# Patient Record
Sex: Male | Born: 1950
Health system: Southern US, Community
[De-identification: ages and names within clinical notes are randomized; demographics above are authoritative.]

## PROBLEM LIST (undated history)

## (undated) DIAGNOSIS — B019 Varicella without complication: Secondary | ICD-10-CM

## (undated) DIAGNOSIS — E119 Type 2 diabetes mellitus without complications: Secondary | ICD-10-CM

## (undated) DIAGNOSIS — K5790 Diverticulosis of intestine, part unspecified, without perforation or abscess without bleeding: Secondary | ICD-10-CM

## (undated) DIAGNOSIS — I219 Acute myocardial infarction, unspecified: Secondary | ICD-10-CM

## (undated) DIAGNOSIS — H409 Unspecified glaucoma: Secondary | ICD-10-CM

## (undated) DIAGNOSIS — I2510 Atherosclerotic heart disease of native coronary artery without angina pectoris: Secondary | ICD-10-CM

## (undated) DIAGNOSIS — K219 Gastro-esophageal reflux disease without esophagitis: Secondary | ICD-10-CM

## (undated) DIAGNOSIS — G4730 Sleep apnea, unspecified: Secondary | ICD-10-CM

## (undated) DIAGNOSIS — F329 Major depressive disorder, single episode, unspecified: Secondary | ICD-10-CM

## (undated) DIAGNOSIS — F419 Anxiety disorder, unspecified: Secondary | ICD-10-CM

## (undated) DIAGNOSIS — N419 Inflammatory disease of prostate, unspecified: Secondary | ICD-10-CM

## (undated) DIAGNOSIS — I499 Cardiac arrhythmia, unspecified: Secondary | ICD-10-CM

## (undated) DIAGNOSIS — N200 Calculus of kidney: Secondary | ICD-10-CM

## (undated) DIAGNOSIS — I252 Old myocardial infarction: Secondary | ICD-10-CM

## (undated) DIAGNOSIS — I10 Essential (primary) hypertension: Secondary | ICD-10-CM

## (undated) DIAGNOSIS — E785 Hyperlipidemia, unspecified: Secondary | ICD-10-CM

## (undated) DIAGNOSIS — K5792 Diverticulitis of intestine, part unspecified, without perforation or abscess without bleeding: Secondary | ICD-10-CM

## (undated) DIAGNOSIS — K259 Gastric ulcer, unspecified as acute or chronic, without hemorrhage or perforation: Secondary | ICD-10-CM

## (undated) DIAGNOSIS — IMO0001 Reserved for inherently not codable concepts without codable children: Secondary | ICD-10-CM

## (undated) DIAGNOSIS — N390 Urinary tract infection, site not specified: Secondary | ICD-10-CM

## (undated) DIAGNOSIS — J189 Pneumonia, unspecified organism: Secondary | ICD-10-CM

## (undated) DIAGNOSIS — F32A Depression, unspecified: Secondary | ICD-10-CM

## (undated) DIAGNOSIS — K2270 Barrett's esophagus without dysplasia: Secondary | ICD-10-CM

## (undated) HISTORY — DX: Old myocardial infarction: I25.2

## (undated) HISTORY — DX: Cardiac arrhythmia, unspecified: I49.9

## (undated) HISTORY — DX: Urinary tract infection, site not specified: N39.0

## (undated) HISTORY — DX: Varicella without complication: B01.9

## (undated) HISTORY — PX: EYE SURGERY: SHX253

## (undated) HISTORY — DX: Barrett's esophagus without dysplasia: K22.70

## (undated) HISTORY — DX: Anxiety disorder, unspecified: F41.9

## (undated) HISTORY — DX: Gastro-esophageal reflux disease without esophagitis: K21.9

## (undated) HISTORY — DX: Calculus of kidney: N20.0

## (undated) HISTORY — DX: Type 2 diabetes mellitus without complications: E11.9

## (undated) HISTORY — PX: OTHER SURGICAL HISTORY: SHX169

## (undated) HISTORY — DX: Diverticulitis of intestine, part unspecified, without perforation or abscess without bleeding: K57.92

## (undated) HISTORY — DX: Sleep apnea, unspecified: G47.30

---
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
## 2014-11-19 DIAGNOSIS — J189 Pneumonia, unspecified organism: Secondary | ICD-10-CM

## 2014-11-19 HISTORY — DX: Pneumonia, unspecified organism: J18.9

## 2015-06-10 ENCOUNTER — Emergency Department (HOSPITAL_COMMUNITY): Payer: Medicare Other

## 2015-06-10 ENCOUNTER — Inpatient Hospital Stay (HOSPITAL_COMMUNITY)
Admission: EM | Admit: 2015-06-10 | Discharge: 2015-06-22 | DRG: 871 | Disposition: A | Discharge status: Rehabilitation Facility | Payer: Medicare Other | Attending: Internal Medicine | Admitting: Internal Medicine

## 2015-06-10 ENCOUNTER — Encounter (HOSPITAL_COMMUNITY): Payer: Self-pay | Admitting: *Deleted

## 2015-06-10 DIAGNOSIS — Z7982 Long term (current) use of aspirin: Secondary | ICD-10-CM

## 2015-06-10 DIAGNOSIS — E875 Hyperkalemia: Secondary | ICD-10-CM | POA: Diagnosis not present

## 2015-06-10 DIAGNOSIS — R911 Solitary pulmonary nodule: Secondary | ICD-10-CM | POA: Diagnosis not present

## 2015-06-10 DIAGNOSIS — I25110 Atherosclerotic heart disease of native coronary artery with unstable angina pectoris: Secondary | ICD-10-CM | POA: Diagnosis not present

## 2015-06-10 DIAGNOSIS — R000 Tachycardia, unspecified: Secondary | ICD-10-CM

## 2015-06-10 DIAGNOSIS — A419 Sepsis, unspecified organism: Principal | ICD-10-CM | POA: Diagnosis present

## 2015-06-10 DIAGNOSIS — D62 Acute posthemorrhagic anemia: Secondary | ICD-10-CM | POA: Diagnosis not present

## 2015-06-10 DIAGNOSIS — Z6841 Body Mass Index (BMI) 40.0 and over, adult: Secondary | ICD-10-CM

## 2015-06-10 DIAGNOSIS — Z955 Presence of coronary angioplasty implant and graft: Secondary | ICD-10-CM | POA: Diagnosis not present

## 2015-06-10 DIAGNOSIS — E876 Hypokalemia: Secondary | ICD-10-CM | POA: Diagnosis present

## 2015-06-10 DIAGNOSIS — I2510 Atherosclerotic heart disease of native coronary artery without angina pectoris: Secondary | ICD-10-CM | POA: Diagnosis not present

## 2015-06-10 DIAGNOSIS — N179 Acute kidney failure, unspecified: Secondary | ICD-10-CM | POA: Diagnosis present

## 2015-06-10 DIAGNOSIS — E785 Hyperlipidemia, unspecified: Secondary | ICD-10-CM | POA: Diagnosis not present

## 2015-06-10 DIAGNOSIS — N390 Urinary tract infection, site not specified: Secondary | ICD-10-CM | POA: Diagnosis not present

## 2015-06-10 DIAGNOSIS — R7989 Other specified abnormal findings of blood chemistry: Secondary | ICD-10-CM | POA: Diagnosis not present

## 2015-06-10 DIAGNOSIS — R197 Diarrhea, unspecified: Secondary | ICD-10-CM | POA: Diagnosis not present

## 2015-06-10 DIAGNOSIS — IMO0001 Reserved for inherently not codable concepts without codable children: Secondary | ICD-10-CM | POA: Diagnosis present

## 2015-06-10 DIAGNOSIS — R0609 Other forms of dyspnea: Secondary | ICD-10-CM | POA: Diagnosis present

## 2015-06-10 DIAGNOSIS — I509 Heart failure, unspecified: Secondary | ICD-10-CM | POA: Diagnosis not present

## 2015-06-10 DIAGNOSIS — I129 Hypertensive chronic kidney disease with stage 1 through stage 4 chronic kidney disease, or unspecified chronic kidney disease: Secondary | ICD-10-CM | POA: Diagnosis present

## 2015-06-10 DIAGNOSIS — J810 Acute pulmonary edema: Secondary | ICD-10-CM | POA: Diagnosis present

## 2015-06-10 DIAGNOSIS — K921 Melena: Secondary | ICD-10-CM | POA: Diagnosis not present

## 2015-06-10 DIAGNOSIS — N189 Chronic kidney disease, unspecified: Secondary | ICD-10-CM | POA: Diagnosis not present

## 2015-06-10 DIAGNOSIS — H409 Unspecified glaucoma: Secondary | ICD-10-CM | POA: Diagnosis not present

## 2015-06-10 DIAGNOSIS — I214 Non-ST elevation (NSTEMI) myocardial infarction: Secondary | ICD-10-CM | POA: Diagnosis present

## 2015-06-10 DIAGNOSIS — R933 Abnormal findings on diagnostic imaging of other parts of digestive tract: Secondary | ICD-10-CM | POA: Diagnosis not present

## 2015-06-10 DIAGNOSIS — I472 Ventricular tachycardia: Secondary | ICD-10-CM

## 2015-06-10 DIAGNOSIS — E872 Acidosis, unspecified: Secondary | ICD-10-CM | POA: Diagnosis present

## 2015-06-10 DIAGNOSIS — F329 Major depressive disorder, single episode, unspecified: Secondary | ICD-10-CM | POA: Diagnosis present

## 2015-06-10 DIAGNOSIS — R100 Acute abdomen: Secondary | ICD-10-CM | POA: Diagnosis not present

## 2015-06-10 DIAGNOSIS — A09 Infectious gastroenteritis and colitis, unspecified: Secondary | ICD-10-CM | POA: Diagnosis not present

## 2015-06-10 DIAGNOSIS — R1084 Generalized abdominal pain: Secondary | ICD-10-CM

## 2015-06-10 DIAGNOSIS — Z79899 Other long term (current) drug therapy: Secondary | ICD-10-CM | POA: Diagnosis not present

## 2015-06-10 DIAGNOSIS — K559 Vascular disorder of intestine, unspecified: Secondary | ICD-10-CM | POA: Diagnosis present

## 2015-06-10 DIAGNOSIS — R5381 Other malaise: Secondary | ICD-10-CM | POA: Diagnosis not present

## 2015-06-10 DIAGNOSIS — R109 Unspecified abdominal pain: Secondary | ICD-10-CM | POA: Diagnosis present

## 2015-06-10 DIAGNOSIS — I10 Essential (primary) hypertension: Secondary | ICD-10-CM | POA: Diagnosis not present

## 2015-06-10 DIAGNOSIS — Z87891 Personal history of nicotine dependence: Secondary | ICD-10-CM | POA: Diagnosis not present

## 2015-06-10 DIAGNOSIS — N3000 Acute cystitis without hematuria: Secondary | ICD-10-CM | POA: Diagnosis not present

## 2015-06-10 DIAGNOSIS — K529 Noninfective gastroenteritis and colitis, unspecified: Secondary | ICD-10-CM | POA: Diagnosis not present

## 2015-06-10 DIAGNOSIS — R778 Other specified abnormalities of plasma proteins: Secondary | ICD-10-CM | POA: Diagnosis present

## 2015-06-10 DIAGNOSIS — E869 Volume depletion, unspecified: Secondary | ICD-10-CM | POA: Diagnosis not present

## 2015-06-10 DIAGNOSIS — K219 Gastro-esophageal reflux disease without esophagitis: Secondary | ICD-10-CM | POA: Diagnosis not present

## 2015-06-10 HISTORY — DX: Reserved for inherently not codable concepts without codable children: IMO0001

## 2015-06-10 HISTORY — DX: Essential (primary) hypertension: I10

## 2015-06-10 HISTORY — DX: Pneumonia, unspecified organism: J18.9

## 2015-06-10 HISTORY — DX: Gastric ulcer, unspecified as acute or chronic, without hemorrhage or perforation: K25.9

## 2015-06-10 HISTORY — DX: Atherosclerotic heart disease of native coronary artery without angina pectoris: I25.10

## 2015-06-10 HISTORY — DX: Unspecified glaucoma: H40.9

## 2015-06-10 HISTORY — DX: Hyperlipidemia, unspecified: E78.5

## 2015-06-10 HISTORY — DX: Diverticulosis of intestine, part unspecified, without perforation or abscess without bleeding: K57.90

## 2015-06-10 HISTORY — DX: Gastro-esophageal reflux disease without esophagitis: K21.9

## 2015-06-10 HISTORY — DX: Inflammatory disease of prostate, unspecified: N41.9

## 2015-06-10 LAB — URINALYSIS, ROUTINE W REFLEX MICROSCOPIC
Glucose, UA: NEGATIVE mg/dL
Hgb urine dipstick: NEGATIVE
Ketones, ur: 15 mg/dL — AB
Leukocytes, UA: NEGATIVE
NITRITE: NEGATIVE
Protein, ur: 100 mg/dL — AB
Specific Gravity, Urine: >1.03 — ABNORMAL HIGH (ref 1.005–1.030)
Urobilinogen, UA: 1 mg/dL (ref 0.0–1.0)
pH: 5.5 (ref 5.0–8.0)

## 2015-06-10 LAB — I-STAT CG4 LACTIC ACID, ED: Lactic Acid, Venous: 2.82 mmol/L (ref 0.5–2.0)

## 2015-06-10 LAB — CBC WITH DIFFERENTIAL/PLATELET
BASOS PCT: 0 % (ref 0–1)
Basophils Absolute: 0 10*3/uL (ref 0.0–0.1)
EOS PCT: 0 % (ref 0–5)
Eosinophils Absolute: 0 10*3/uL (ref 0.0–0.7)
HCT: 50.5 % (ref 39.0–52.0)
HEMOGLOBIN: 16.5 g/dL (ref 13.0–17.0)
LYMPHS ABS: 1.2 10*3/uL (ref 0.7–4.0)
LYMPHS PCT: 6 % — AB (ref 12–46)
MCH: 28.7 pg (ref 26.0–34.0)
MCHC: 32.7 g/dL (ref 30.0–36.0)
MCV: 87.8 fL (ref 78.0–100.0)
MONO ABS: 0.8 10*3/uL (ref 0.1–1.0)
Monocytes Relative: 4 % (ref 3–12)
NEUTROS PCT: 90 % — AB (ref 43–77)
Neutro Abs: 17.3 10*3/uL — ABNORMAL HIGH (ref 1.7–7.7)
PLATELETS: 268 10*3/uL (ref 150–400)
RBC: 5.75 MIL/uL (ref 4.22–5.81)
RDW: 14.4 % (ref 11.5–15.5)
WBC: 19.4 10*3/uL — AB (ref 4.0–10.5)

## 2015-06-10 LAB — COMPREHENSIVE METABOLIC PANEL
ALBUMIN: 4.1 g/dL (ref 3.5–5.0)
ALT: 24 U/L (ref 17–63)
AST: 25 U/L (ref 15–41)
Alkaline Phosphatase: 49 U/L (ref 38–126)
Anion gap: 10 (ref 5–15)
BUN: 30 mg/dL — AB (ref 6–20)
CALCIUM: 9.5 mg/dL (ref 8.9–10.3)
CHLORIDE: 101 mmol/L (ref 101–111)
CO2: 23 mmol/L (ref 22–32)
Creatinine, Ser: 2.25 mg/dL — ABNORMAL HIGH (ref 0.61–1.24)
GFR calc non Af Amer: 29 mL/min — ABNORMAL LOW (ref >60)
GFR, EST AFRICAN AMERICAN: 33 mL/min — AB (ref >60)
Glucose, Bld: 253 mg/dL — ABNORMAL HIGH (ref 65–99)
POTASSIUM: 5.1 mmol/L (ref 3.5–5.1)
SODIUM: 134 mmol/L — AB (ref 135–145)
TOTAL PROTEIN: 7 g/dL (ref 6.5–8.1)
Total Bilirubin: 0.8 mg/dL (ref 0.3–1.2)

## 2015-06-10 LAB — TROPONIN I
Troponin I: 0.37 ng/mL — ABNORMAL HIGH (ref <0.031)
Troponin I: 0.95 ng/mL (ref <0.031)

## 2015-06-10 LAB — LIPASE, BLOOD: Lipase: 14 U/L — ABNORMAL LOW (ref 22–51)

## 2015-06-10 LAB — PROTIME-INR
INR: 1.08 (ref 0.00–1.49)
Prothrombin Time: 14.2 seconds (ref 11.6–15.2)

## 2015-06-10 LAB — URINE MICROSCOPIC-ADD ON

## 2015-06-10 LAB — BRAIN NATRIURETIC PEPTIDE: B Natriuretic Peptide: 16 pg/mL (ref 0.0–100.0)

## 2015-06-10 MED ORDER — HEPARIN BOLUS VIA INFUSION
4000.0000 [IU] | Freq: Once | INTRAVENOUS | Status: AC
  Administered 2015-06-10: 4000 [IU] via INTRAVENOUS
  Filled 2015-06-10: qty 4000

## 2015-06-10 MED ORDER — SODIUM CHLORIDE 0.9 % IV SOLN
Freq: Once | INTRAVENOUS | Status: AC
  Administered 2015-06-10: 12:00:00 via INTRAVENOUS

## 2015-06-10 MED ORDER — HYDROMORPHONE HCL 1 MG/ML IJ SOLN
1.0000 mg | Freq: Once | INTRAMUSCULAR | Status: AC
  Administered 2015-06-10: 1 mg via INTRAVENOUS
  Filled 2015-06-10: qty 1

## 2015-06-10 MED ORDER — IOHEXOL 300 MG/ML  SOLN
50.0000 mL | Freq: Once | INTRAMUSCULAR | Status: AC | PRN
  Administered 2015-06-10: 50 mL via ORAL

## 2015-06-10 MED ORDER — OXYCODONE HCL 5 MG PO TABS
5.0000 mg | ORAL_TABLET | Freq: Four times a day (QID) | ORAL | Status: DC | PRN
  Administered 2015-06-11 – 2015-06-13 (×4): 5 mg via ORAL
  Filled 2015-06-10 (×4): qty 1

## 2015-06-10 MED ORDER — METRONIDAZOLE IN NACL 5-0.79 MG/ML-% IV SOLN
500.0000 mg | Freq: Four times a day (QID) | INTRAVENOUS | Status: DC
  Administered 2015-06-10 – 2015-06-20 (×39): 500 mg via INTRAVENOUS
  Filled 2015-06-10 (×41): qty 100

## 2015-06-10 MED ORDER — ONDANSETRON HCL 4 MG/2ML IJ SOLN
4.0000 mg | Freq: Once | INTRAMUSCULAR | Status: AC
  Administered 2015-06-10: 4 mg via INTRAVENOUS

## 2015-06-10 MED ORDER — CIPROFLOXACIN IN D5W 400 MG/200ML IV SOLN
400.0000 mg | Freq: Two times a day (BID) | INTRAVENOUS | Status: DC
  Administered 2015-06-10 – 2015-06-19 (×19): 400 mg via INTRAVENOUS
  Filled 2015-06-10 (×23): qty 200

## 2015-06-10 MED ORDER — SODIUM CHLORIDE 0.9 % IV SOLN
INTRAVENOUS | Status: DC
  Administered 2015-06-10 – 2015-06-14 (×7): via INTRAVENOUS

## 2015-06-10 MED ORDER — PNEUMOCOCCAL VAC POLYVALENT 25 MCG/0.5ML IJ INJ
0.5000 mL | INJECTION | INTRAMUSCULAR | Status: AC
  Administered 2015-06-11: 0.5 mL via INTRAMUSCULAR
  Filled 2015-06-10 (×2): qty 0.5

## 2015-06-10 MED ORDER — NITROGLYCERIN 2 % TD OINT
1.0000 [in_us] | TOPICAL_OINTMENT | Freq: Four times a day (QID) | TRANSDERMAL | Status: DC
  Administered 2015-06-10 – 2015-06-20 (×32): 1 [in_us] via TOPICAL
  Filled 2015-06-10 (×12): qty 30
  Filled 2015-06-10: qty 1
  Filled 2015-06-10 (×5): qty 30

## 2015-06-10 MED ORDER — ONDANSETRON HCL 4 MG/2ML IJ SOLN
INTRAMUSCULAR | Status: AC
  Filled 2015-06-10: qty 2

## 2015-06-10 MED ORDER — HEPARIN (PORCINE) IN NACL 100-0.45 UNIT/ML-% IJ SOLN
2500.0000 [IU]/h | INTRAMUSCULAR | Status: DC
  Administered 2015-06-10: 1500 [IU]/h via INTRAVENOUS
  Administered 2015-06-11: 2100 [IU]/h via INTRAVENOUS
  Administered 2015-06-11: 1850 [IU]/h via INTRAVENOUS
  Administered 2015-06-11 – 2015-06-12 (×3): 2100 [IU]/h via INTRAVENOUS
  Administered 2015-06-13: 2900 [IU] via INTRAVENOUS
  Administered 2015-06-14 – 2015-06-15 (×4): 2900 [IU]/h via INTRAVENOUS
  Administered 2015-06-15: 2850 [IU]/h via INTRAVENOUS
  Administered 2015-06-16: 2500 [IU]/h via INTRAVENOUS
  Filled 2015-06-10 (×19): qty 250

## 2015-06-10 MED ORDER — ONDANSETRON HCL 4 MG/2ML IJ SOLN: 4.0000 mg | Freq: Once | INTRAMUSCULAR | Status: DC

## 2015-06-10 NOTE — H&P (Signed)
History and Physical  Sergio Griffin OVF:[id number] DOB: September 24, 1951 DOA: 06/10/2015  Referring physician: Dr Betsey Holiday, ED physician PCP: No primary care provider on file.   Chief Complaint: Abdominal pain, bloody diarrhea, chest pain  HPI: Sergio Griffin is a 64 y.o. male  With a history of hypertension, hyperlipidemia, GERD, coronary artery disease with coronary angiogram with stenting approximately 5-6 years ago. He has a cardiologist up in [place] that he follows with yearly. He presents to the hospital with increasing abdominal pain, bloody diarrhea that started at approximately 1 this morning. He has had multiple episodes of bloody diarrhea that has progressed throughout the morning, accompanied by feeling extremely ill with left-sided, substernal chest pain, diaphoresis, cold sweats, and retching. At approximately 11 AM, the patient decided to present to the emergency room for evaluation. He has had no bloody diarrhea since that time.  With his chest pain, the patient has had left-sided, substernal chest pain doubt radiation, associated with diaphoresis, cold sweats, nausea. He had the chest pain for approximately 6-7 hours, but this has gradually trailed off since being at the hospital. He currently reports no chest pain.   Review of Systems:   Pt denies any fevers, chills, palpitations, headaches, blurred vision, vomiting, rashes, bruising, petechiae, vision changes. He also denies sick contacts, recent antibiotic use, travel. Review of systems are otherwise negative  Past Medical History  Diagnosis Date  . Hypertension   . Reflux   . Glaucoma   . Hyperlipidemia    Past Surgical History  Procedure Laterality Date  . Cardiac stents    . Eye surgery     Social History:  reports that he has quit smoking. He does not have any smokeless tobacco history on file. He reports that he does not drink alcohol. His drug history is not on file. Patient lives at home & is able to  participate in activities of daily living   Allergies  Allergen Reactions  . Sulfa Antibiotics Itching    Family history of obesity  Prior to Admission medications   Medication Sig Start Date End Date Taking? Authorizing Provider  aspirin EC 81 MG tablet Take 162 mg by mouth daily.   Yes Historical Provider, MD  buPROPion (WELLBUTRIN XL) 300 MG 24 hr tablet Take 300 mg by mouth at bedtime. 05/10/15  Yes Historical Provider, MD  clonazePAM (KLONOPIN) 0.5 MG tablet Take 0.5 mg by mouth 2 (two) times daily. 05/10/15  Yes Historical Provider, MD  latanoprost (XALATAN) 0.005 % ophthalmic solution Place 1 drop into both eyes at bedtime. 05/03/15  Yes Historical Provider, MD  losartan (COZAAR) 50 MG tablet Take 50 mg by mouth daily. 03/25/15  Yes Historical Provider, MD  metoprolol succinate (TOPROL-XL) 25 MG 24 hr tablet Take 25 mg by mouth daily. 05/02/15  Yes Historical Provider, MD  Omega-3 Fatty Acids (FISH OIL) 1000 MG CAPS Take 1,000 mg by mouth daily.   Yes Historical Provider, MD  promethazine (PHENERGAN) 25 MG tablet Take 25 mg by mouth every 6 (six) hours as needed for nausea or vomiting.   Yes Historical Provider, MD  rosuvastatin (CRESTOR) 20 MG tablet Take 20 mg by mouth every evening.   Yes Historical Provider, MD  sucralfate (CARAFATE) 1 G tablet Take 1 g by mouth 4 (four) times daily -  with meals and at bedtime.   Yes Historical Provider, MD  Vitamin D, Cholecalciferol, 1000 UNITS TABS Take 1,000 mg by mouth daily.   Yes Historical Provider, MD  Physical Exam: BP 189/71 mmHg  Pulse 69  Temp(Src) 97.6 F (36.4 C) (Oral)  Resp 18  Ht 5\' 11"  (1.803 m)  Wt 141.794 kg (312 lb 9.6 oz)  BMI 43.62 kg/m2  SpO2 94%  General: Older Caucasian male. Awake and alert and oriented x3. No acute cardiopulmonary distress.  Eyes: Pupils equal, round, reactive to light. Extraocular muscles are intact. Sclerae anicteric and noninjected.  ENT:  Moist mucosal membranes. No mucosal lesions. Teeth  in moderate repair  Neck: Neck supple without lymphadenopathy. No carotid bruits. No masses palpated.  Cardiovascular: Regular rate with normal S1-S2 sounds. No murmurs, rubs, gallops auscultated. No JVD.  Respiratory: Good respiratory effort with no wheezes, rales, rhonchi. Lungs clear to auscultation bilaterally.  Abdomen: Soft, mildly tender on the left side, nondistended. Active bowel sounds. No masses or hepatosplenomegaly  Skin: Dry, warm to touch. 2+ dorsalis pedis and radial pulses. Musculoskeletal: No calf or leg pain. All major joints not erythematous nontender.  Psychiatric: Intact judgment and insight.  Neurologic: No focal neurological deficits. Cranial nerves II through XII are grossly intact.           Labs on Admission:  Basic Metabolic Panel:  Recent Labs Lab 06/10/15 1225  NA 134*  K 5.1  CL 101  CO2 23  GLUCOSE 253*  BUN 30*  CREATININE 2.25*  CALCIUM 9.5   Liver Function Tests:  Recent Labs Lab 06/10/15 1225  AST 25  ALT 24  ALKPHOS 49  BILITOT 0.8  PROT 7.0  ALBUMIN 4.1    Recent Labs Lab 06/10/15 1225  LIPASE 14*   No results for input(s): AMMONIA in the last 168 hours. CBC:  Recent Labs Lab 06/10/15 1225  WBC 19.4*  NEUTROABS 17.3*  HGB 16.5  HCT 50.5  MCV 87.8  PLT 268   Cardiac Enzymes:  Recent Labs Lab 06/10/15 1225 06/10/15 1657  TROPONINI 0.37* 0.95*    BNP (last 3 results)  Recent Labs  06/10/15 1225  BNP 16.0    ProBNP (last 3 results) No results for input(s): PROBNP in the last 8760 hours.  CBG: No results for input(s): GLUCAP in the last 168 hours.  Radiological Exams on Admission: Ct Abdomen Pelvis Wo Contrast  06/10/2015   CLINICAL DATA:  Abdominal pain, constipation. Bright red blood in stool.  EXAM: CT ABDOMEN AND PELVIS WITHOUT CONTRAST  TECHNIQUE: Multidetector CT imaging of the abdomen and pelvis was performed following the standard protocol without IV contrast.  COMPARISON:  None.  FINDINGS:  The transverse colon and descending colon are decompressed which limits characterization of their walls but I suspect mild thickening of the walls of the descending colon. There is also subtle inflammatory stranding within the paracolic fat adjacent to the descending colon and trace free fluid within the lower left paracolic gutter.  Scattered diverticulosis noted within the descending and sigmoid colon, most prominent in the sigmoid colon, but no focal inflammatory change to suggest acute diverticulitis. Large and small bowel are normal in caliber throughout.  Liver, spleen, pancreas, gallbladder, and adrenal glands are within normal limits for a noncontrast study. There are 2 nonobstructing stones in the left renal pelvis, largest measuring 6 mm. No associated hydronephrosis. 10 mm hypodense mass exophytic to the lateral cortex of the right kidney is too small to characterize but probably a small cyst. No right- sided renal stone or hydronephrosis. No ureteral or bladder calculi.  No free fluid or abscess collection. No free intraperitoneal air. Scattered atherosclerotic changes are seen  along the walls of the normal- caliber abdominal aorta.  There is mild dependent atelectasis within the lower lungs bilaterally. 7 mm pulmonary nodule is also identified within the right lower lobe on axial image 5.  IMPRESSION: 1. Probable mild thickening of the walls of the descending colon suggesting a mild colitis of infectious or inflammatory nature. Associated subtle inflammatory stranding within the paracolic fat and trace free fluid in the lower left paracolic gutter. 2. Colonic diverticulosis without evidence of acute diverticulitis. 3. 7 mm pulmonary nodule within the right lower lobe, too small to definitively characterize. If the patient is at high risk for bronchogenic carcinoma, follow-up chest CT at 3-70months is recommended. If the patient is at low risk for bronchogenic carcinoma, follow-up chest CT at 6-12 months  is recommended. This recommendation follows the consensus statement: Guidelines for Management of Small Pulmonary Nodules Detected on CT Scans: A Statement from the [place] as published in Radiology 2005; 237:395-400. 4. 1 cm mass exophytic to the lateral cortex of the right kidney. This is also too small to definitively characterize but most likely a benign cyst based on CT density measurements. Would consider follow-up renal ultrasound at some point to ensure benignity. 5. Left nephrolithiasis. 6. No bowel obstruction. No abscess collection. No free intraperitoneal air. These results were called by telephone at the time of interpretation on 06/10/2015 at 4:24 pm to Dr. Joseph Berkshire , who verbally acknowledged these results.   Electronically Signed   By: Franki Cabot M.D.   On: 06/10/2015 16:25   Dg Abd Acute W/chest  06/10/2015   CLINICAL DATA:  Sudden onset of severe abdominal pain at 0300 hours, abdominal distension, history hypertension, diverticulitis, former smoker  EXAM: DG ABDOMEN ACUTE W/ 1V CHEST  COMPARISON:  None  FINDINGS: Enlargement of cardiac silhouette.  Mediastinal contours and pulmonary vascularity normal.  Lungs clear.  No pleural effusion or pneumothorax.  Mild gaeous distention of stomach.  Question wall thickening of duodenum.  Nonobstructive bowel gas pattern otherwise seen.  No bowel dilatation or free air.  No urinary tract calcification or bone abnormality.  IMPRESSION: Enlargement of cardiac silhouette.  Mild gaseous distention of stomach with questionable wall thickening of the descending duodenum.  Otherwise normal bowel gas pattern.   Electronically Signed   By: Lavonia Dana M.D.   On: 06/10/2015 13:23    EKG: Independently reviewed. Normal sinus rhythm with a ventricular rate of 69. Normal QRS and PR intervals.  Q waves in leads 3 and aVF suggest old inferior MI  Assessment/Plan Present on Admission:  . Colitis . NSTEMI (non-ST elevated myocardial  infarction) . Acute on chronic kidney failure . Lactic acidosis  This patient was discussed with the ED physician, including pertinent vitals, physical exam findings, labs, and imaging.  We also discussed care given by the ED provider.  #1 NSTEMI  Admit to stepdown [facility]  I discussed this patient with Dr. Tommi Rumps of cardiology who recommended starting anticoagulation, which will have to be cautious about given that the patient has bloody infectious colitis. I discussed with the patient the risks and benefits of treatment with heparin.  Currently the patient is without chest pain  Start full dose heparin drip per ACS  Apply Nitropaste  Follow blood pressure  Cycle troponins  Obtain lipid panel morning  Daily aspirin  Daily EKGs #2 infectious colitis  Continue Cipro and Flagyl  Keep nothing by mouth/sips with meds  IV fluids at 125 mLper hour  Recheck CBC  in the morning #3 lactic acidosis  Fluid resuscitated - we'll follow lactic acid level #4 acute on chronic kidney failure  We'll hydrates, which will likely resolve his acute on chronic kidney failure  Recheck creatinine in the morning #5 possible UTI  Cipro should cover #6 hypertension  Patient currently on beta blocker  We'll see how he responds to nitroglycerin paste  DVT prophylaxis: On full dose anticoagulation with heparin  Consultants: Cardiology  Code Status: Full code  Family Communication: None   Disposition Plan: Transfer to Stepdown at [place], [place] [facility] Pager [phone]

## 2015-06-10 NOTE — Plan of Care (Signed)
Pt concern about blood in stool. RN witnessed bright red blood in toilet (moderate amt). Small BM (hard pebbles). Pt also informed RN that he continues to have urgency to urinate - but only urinates a little. Pt educated that it's fairly normal for a UTI.

## 2015-06-10 NOTE — Progress Notes (Signed)
ANTIBIOTIC CONSULT NOTE - INITIAL  Pharmacy Consult for Cipro Indication: Intra abdominal infection  Allergies  Allergen Reactions  . Sulfa Antibiotics Itching    Patient Measurements: Height: 5\' 11"  (180.3 cm) Weight: (!) 312 lb (141.522 kg) IBW/kg (Calculated) : 75.3 Adjusted Body Weight:   Vital Signs: Temp: 97.6 F (36.4 C) (07/22 1152) Temp Source: Oral (07/22 1152) BP: 152/87 mmHg (07/22 1600) Pulse Rate: 69 (07/22 1530) Intake/Output from previous day:   Intake/Output from this shift:    Labs:  Recent Labs  06/10/15 1225  WBC 19.4*  HGB 16.5  PLT 268  CREATININE 2.25*   Estimated Creatinine Clearance: 47.1 mL/min (by C-G formula based on Cr of 2.25). No results for input(s): VANCOTROUGH, VANCOPEAK, VANCORANDOM, GENTTROUGH, GENTPEAK, GENTRANDOM, TOBRATROUGH, TOBRAPEAK, TOBRARND, AMIKACINPEAK, AMIKACINTROU, AMIKACIN in the last 72 hours.   Microbiology: No results found for this or any previous visit (from the past 720 hour(s)).  Medical History: Past Medical History  Diagnosis Date  . Hypertension   . Reflux   . Glaucoma   . Hyperlipidemia     Medications:   (Not in a hospital admission) Assessment: 64 yo ED patient, Cipro per pharmacy for intra abdominal infection CrCl 47.1 ml/min ED MD note presently not available  Goal of Therapy:  Eradicate infection  Plan:  Cipro 400 mg IV every 12 hours Monitor renal function Labs per protocol  Abner Greenspan, Hillarie Harrigan Bennett 06/10/2015,4:49 PM

## 2015-06-10 NOTE — Consult Note (Addendum)
Patient ID: DEMAURI ADVINCULA MRN: [id number], DOB/AGE: October 24, 1951   Admit date: 06/10/2015   Primary Physician: No primary care provider on file. Primary Cardiologist: Orpah Greek [place], [place])  Pt. Profile:  38M with HTN, HLD, CAD s/p PCI x 2 in 2011 who presents with colitis, AKI, lactic acidosis, and NSTEMI.   Problem List  Past Medical History  Diagnosis Date  . Hypertension   . Reflux   . Glaucoma   . Hyperlipidemia     Past Surgical History  Procedure Laterality Date  . Cardiac stents    . Eye surgery       Allergies  Allergies  Allergen Reactions  . Sulfa Antibiotics Itching    HPI  38M with HTN, HLD, CAD s/p PCI x 2 in 2011 who presents with colitis, AKI, lactic acidosis, and NSTEMI.   Mr. Jobe Igo reports that he developed abdominal distention and bloating at 3am. He felt unwell and thought he was constipated. Abdomen felt distended and tight. He tried MOM and finally had a large BM after having an email. Hoewver, it was bloody. During these episodes, he felt SOB, warm, and "pasty." He denied having chest pain to me but apparently reported it to other providers. He went to [facility] where CT scan demonstrated mild thickening of the walls of the descending colon suggesting a mild colitis of infectious or inflammatory nature. Labs were notable for K 5.1, Cr 2.25 (no known CKD per pt), BNP 16, TnI 0.37-->0.95-->0.48, lactate 2.82, WBC 19.4. NSR, LAD, old IMI, possible old anterior MI. He was started on antibiotics and after speaking with Dr. Debara Pickett was started on heparin. He was transferred to [facility] for further care.   Of note, prior to PCI 2011, Mr. Talbot experienced DOE and not frank chest pain. He has had some occasional left shoulder and arm ache recently without clear triggeres. He does note he has had minimal exertional tolerance. He states he has to take a 40 minute nap after fixing breakfast, stating he has "no stamina" and "no energy." He notes frequent snoring, daytime  somnolence, having to nap after meals and in warm rooms.   Home Medications  Prior to Admission medications   Medication Sig Start Date End Date Taking? Authorizing Provider  aspirin EC 81 MG tablet Take 162 mg by mouth daily.   Yes Historical Provider, MD  buPROPion (WELLBUTRIN XL) 300 MG 24 hr tablet Take 300 mg by mouth at bedtime. 05/10/15  Yes Historical Provider, MD  clonazePAM (KLONOPIN) 0.5 MG tablet Take 0.5 mg by mouth 2 (two) times daily. 05/10/15  Yes Historical Provider, MD  latanoprost (XALATAN) 0.005 % ophthalmic solution Place 1 drop into both eyes at bedtime. 05/03/15  Yes Historical Provider, MD  losartan (COZAAR) 50 MG tablet Take 50 mg by mouth daily. 03/25/15  Yes Historical Provider, MD  metoprolol succinate (TOPROL-XL) 25 MG 24 hr tablet Take 25 mg by mouth daily. 05/02/15  Yes Historical Provider, MD  Omega-3 Fatty Acids (FISH OIL) 1000 MG CAPS Take 1,000 mg by mouth daily.   Yes Historical Provider, MD  promethazine (PHENERGAN) 25 MG tablet Take 25 mg by mouth every 6 (six) hours as needed for nausea or vomiting.   Yes Historical Provider, MD  rosuvastatin (CRESTOR) 20 MG tablet Take 20 mg by mouth every evening.   Yes Historical Provider, MD  sucralfate (CARAFATE) 1 G tablet Take 1 g by mouth 4 (four) times daily -  with meals and at bedtime.   Yes  Historical Provider, MD  Vitamin D, Cholecalciferol, 1000 UNITS TABS Take 1,000 mg by mouth daily.   Yes Historical Provider, MD    Family History  History reviewed. No pertinent family history.  Social History  History   Social History  . Marital Status: Divorced    Spouse Name: N/A  . Number of Children: N/A  . Years of Education: N/A   Occupational History  . Not on file.   Social History Main Topics  . Smoking status: Former Research scientist (life sciences)  . Smokeless tobacco: Not on file  . Alcohol Use: No  . Drug Use: Not on file  . Sexual Activity: Not on file   Other Topics Concern  . Not on file   Social History Narrative   . No narrative on file     Review of Systems General:  No chills, fever, night sweats or weight changes.  Cardiovascular:  See HPI Dermatological: No rash, lesions/masses Respiratory: No cough. + dyspnea Urologic: No hematuria, dysuria Abdominal:   No nausea, vomiting, diarrhea, bright red blood per rectum, melena, or hematemesis Neurologic:  No visual changes, wkns, changes in mental status. All other systems reviewed and are otherwise negative except as noted above.  Physical Exam  Blood pressure 132/63, pulse 83, temperature 98 F (36.7 C), temperature source Oral, resp. rate 15, height 5\' 11"  (1.803 m), weight 140.1 kg (308 lb 13.8 oz), SpO2 90 %.  General: obese, tired appearing Psych: Normal affect. Neuro: Alert and oriented X 3. Moves all extremities spontaneously. HEENT: Normal  Neck: Supple without bruits or JVD. Lungs:  Resp regular and unlabored, CTA. Heart: RRR no s3, s4, or murmurs. Abdomen: Soft,distended, TTP particularly in suprapubic area. No R/G. BS + x 4.  Extremities: No clubbing, cyanosis or edema. DP/PT/Radials 2+ and equal bilaterally.  Labs  Troponin (Point of Care Test) No results for input(s): TROPIPOC in the last 72 hours.  Recent Labs  06/10/15 1225 06/10/15 1657 06/11/15 0247  TROPONINI 0.37* 0.95* 0.48*   Lab Results  Component Value Date   WBC 19.4* 06/10/2015   HGB 16.5 06/10/2015   HCT 50.5 06/10/2015   MCV 87.8 06/10/2015   PLT 268 06/10/2015    Recent Labs Lab 06/10/15 1225  NA 134*  K 5.1  CL 101  CO2 23  BUN 30*  CREATININE 2.25*  CALCIUM 9.5  PROT 7.0  BILITOT 0.8  ALKPHOS 49  ALT 24  AST 25  GLUCOSE 253*   No results found for: CHOL, HDL, LDLCALC, TRIG No results found for: DDIMER   Radiology/Studies  Ct Abdomen Pelvis Wo Contrast  06/10/2015   CLINICAL DATA:  Abdominal pain, constipation. Bright red blood in stool.  EXAM: CT ABDOMEN AND PELVIS WITHOUT CONTRAST  TECHNIQUE: Multidetector CT imaging of the  abdomen and pelvis was performed following the standard protocol without IV contrast.  COMPARISON:  None.  FINDINGS: The transverse colon and descending colon are decompressed which limits characterization of their walls but I suspect mild thickening of the walls of the descending colon. There is also subtle inflammatory stranding within the paracolic fat adjacent to the descending colon and trace free fluid within the lower left paracolic gutter.  Scattered diverticulosis noted within the descending and sigmoid colon, most prominent in the sigmoid colon, but no focal inflammatory change to suggest acute diverticulitis. Large and small bowel are normal in caliber throughout.  Liver, spleen, pancreas, gallbladder, and adrenal glands are within normal limits for a noncontrast study. There are 2 nonobstructing stones  in the left renal pelvis, largest measuring 6 mm. No associated hydronephrosis. 10 mm hypodense mass exophytic to the lateral cortex of the right kidney is too small to characterize but probably a small cyst. No right- sided renal stone or hydronephrosis. No ureteral or bladder calculi.  No free fluid or abscess collection. No free intraperitoneal air. Scattered atherosclerotic changes are seen along the walls of the normal- caliber abdominal aorta.  There is mild dependent atelectasis within the lower lungs bilaterally. 7 mm pulmonary nodule is also identified within the right lower lobe on axial image 5.  IMPRESSION: 1. Probable mild thickening of the walls of the descending colon suggesting a mild colitis of infectious or inflammatory nature. Associated subtle inflammatory stranding within the paracolic fat and trace free fluid in the lower left paracolic gutter. 2. Colonic diverticulosis without evidence of acute diverticulitis. 3. 7 mm pulmonary nodule within the right lower lobe, too small to definitively characterize. If the patient is at high risk for bronchogenic carcinoma, follow-up chest CT at  3-59months is recommended. If the patient is at low risk for bronchogenic carcinoma, follow-up chest CT at 6-12 months is recommended. This recommendation follows the consensus statement: Guidelines for Management of Small Pulmonary Nodules Detected on CT Scans: A Statement from the [place] as published in Radiology 2005; 237:395-400. 4. 1 cm mass exophytic to the lateral cortex of the right kidney. This is also too small to definitively characterize but most likely a benign cyst based on CT density measurements. Would consider follow-up renal ultrasound at some point to ensure benignity. 5. Left nephrolithiasis. 6. No bowel obstruction. No abscess collection. No free intraperitoneal air. These results were called by telephone at the time of interpretation on 06/10/2015 at 4:24 pm to Dr. Joseph Berkshire , who verbally acknowledged these results.   Electronically Signed   By: Franki Cabot M.D.   On: 06/10/2015 16:25   Dg Abd Acute W/chest  06/10/2015   CLINICAL DATA:  Sudden onset of severe abdominal pain at 0300 hours, abdominal distension, history hypertension, diverticulitis, former smoker  EXAM: DG ABDOMEN ACUTE W/ 1V CHEST  COMPARISON:  None  FINDINGS: Enlargement of cardiac silhouette.  Mediastinal contours and pulmonary vascularity normal.  Lungs clear.  No pleural effusion or pneumothorax.  Mild gaeous distention of stomach.  Question wall thickening of duodenum.  Nonobstructive bowel gas pattern otherwise seen.  No bowel dilatation or free air.  No urinary tract calcification or bone abnormality.  IMPRESSION: Enlargement of cardiac silhouette.  Mild gaseous distention of stomach with questionable wall thickening of the descending duodenum.  Otherwise normal bowel gas pattern.   Electronically Signed   By: Lavonia Dana M.D.   On: 06/10/2015 13:23    ECG  NSR, LAD, old IMI, possible old anterior MI  ASSESSMENT AND PLAN  58M with HTN, HLD, CAD s/p PCI x 2 in 2011 who presents with  colitis, AKI, lactic acidosis, and NSTEMI.   NSTEMI. No clear ischemic symptoms on my interview. ECG demonstrates evidence of old infarct but no clearly acute changes. Unclear if this is type I or type II event. He does have evidence of systemic hypoperfusion as evidenced by lactic acidosis. However, given magnitude of TnI elevation and fact that he has not had a cath since 2011, he should have risk stratification. Details will depend on renal function recovery, resolution of colitis, and if he has more bloody BMs.  - UFH; will need to closely monitor stools for signs of bleeding -  may ultimately need to stop - ASA 81mg  - metoprolol 12.5mg  BID (would convert to short acting with hold parameters in the event he develops sepsis)  - crestor 20mg  qhs - hold losartan given AKI (I discontinued) - Cycle troponins - TTE - A1c, lipids  AKI - hold losartan - avoid nephrotoxins - this is an important factor re: cath decision - would give additional IVF and aim for at least 2-3L positive; he is in oliguric renal failure  Colitis - per medicine  Other - sleep study as outpatient  Signed, Lamar Sprinkles, MD 06/10/2015, 9:33 PM

## 2015-06-10 NOTE — ED Notes (Signed)
Patient's sats dropped to 88% w/good waveform.  Placed 3L Vivian w/increase in SPO2 to 94%.  Patient became very diaphoretic.  Dr. Betsey Holiday notified of this and lactic acid of 2.82 via istat.

## 2015-06-10 NOTE — ED Notes (Signed)
Pt states abdominal pain, feeling like he is constipated. States that he took a suppository and his son gave him an enema this morning. States that the enema worked but states bright red blood was noticed in the stool. Pt states SOB, abdominal distension and feeling faint since then.

## 2015-06-10 NOTE — Progress Notes (Signed)
CRITICAL VALUE ALERT  Critical value received:  Troponin: 0.95  Date of notification:  06/10/2015  Time of notification:  8346  Critical value read back:Yes.    Nurse who received alert:  T.James, RN  MD notified (1st page):  Dr. Nehemiah Settle  Time of first page:  [phone]  Responding MD: Dr. Nehemiah Settle  Time MD responded:  [phone]  Stat EKG was ordered at this time. EKG is in process at this time. Patient has no complaints. VS are within normal limits.

## 2015-06-10 NOTE — ED Provider Notes (Signed)
CSN: [id number]   Arrival date & time 06/10/15 1145  History  This chart was scribed for  Sergio Greek, MD by Altamease Oiler, ED Scribe. This patient was seen in room APA14/APA14 and the patient's care was started at 12:12 PM.  Chief Complaint  Patient presents with  . Abdominal Pain    HPI The history is provided by the patient. No language interpreter was used.   ROSBEL Griffin is a 64 y.o. male with PMHx of diverticulitis, reflux, HTN, and HLD who presents to the Emergency Department complaining of constant diffuse abdominal pain with gradual onset around 3 AM today. The pain is rated 9/10 in severity. Associated symptoms include abdominal distension, dry heaving, and constipation with no relief from OTC oral medication or a suppository. After the suppository he performed an enema that resulted in multiple loose stools with some blood noted. Pt also complains of new SOB. Last colonoscopy was 5 years ago by Dr. Rogers Seeds in [place].   Past Medical History  Diagnosis Date  . Hypertension   . Reflux   . Glaucoma   . Hyperlipidemia     Past Surgical History  Procedure Laterality Date  . Cardiac stents    . Eye surgery      No family history on file.  History  Substance Use Topics  . Smoking status: Former Research scientist (life sciences)  . Smokeless tobacco: Not on file  . Alcohol Use: No     Review of Systems  Respiratory: Positive for shortness of breath.   Gastrointestinal: Positive for abdominal pain, constipation and blood in stool.       Abdominal distension  All other systems reviewed and are negative.   Home Medications   Prior to Admission medications   Medication Sig Start Date End Date Taking? Authorizing Provider  aspirin EC 81 MG tablet Take 162 mg by mouth daily.   Yes Historical Provider, MD  buPROPion (WELLBUTRIN XL) 300 MG 24 hr tablet Take 300 mg by mouth at bedtime. 05/10/15  Yes Historical Provider, MD  clonazePAM (KLONOPIN) 0.5 MG tablet Take 0.5 mg by  mouth 2 (two) times daily. 05/10/15  Yes Historical Provider, MD  latanoprost (XALATAN) 0.005 % ophthalmic solution Place 1 drop into both eyes at bedtime. 05/03/15  Yes Historical Provider, MD  losartan (COZAAR) 50 MG tablet Take 50 mg by mouth daily. 03/25/15  Yes Historical Provider, MD  metoprolol succinate (TOPROL-XL) 25 MG 24 hr tablet Take 25 mg by mouth daily. 05/02/15  Yes Historical Provider, MD  Omega-3 Fatty Acids (FISH OIL) 1000 MG CAPS Take 1,000 mg by mouth daily.   Yes Historical Provider, MD  promethazine (PHENERGAN) 25 MG tablet Take 25 mg by mouth every 6 (six) hours as needed for nausea or vomiting.   Yes Historical Provider, MD  rosuvastatin (CRESTOR) 20 MG tablet Take 20 mg by mouth every evening.   Yes Historical Provider, MD  sucralfate (CARAFATE) 1 G tablet Take 1 g by mouth 4 (four) times daily -  with meals and at bedtime.   Yes Historical Provider, MD  Vitamin D, Cholecalciferol, 1000 UNITS TABS Take 1,000 mg by mouth daily.   Yes Historical Provider, MD    Allergies  Sulfa antibiotics  Triage Vitals: BP 152/69 mmHg  Pulse 74  Temp(Src) 97.6 F (36.4 C) (Oral)  Resp 18  Ht 5\' 11"  (1.803 m)  Wt 312 lb (141.522 kg)  BMI 43.53 kg/m2  SpO2 96%  Physical Exam  Constitutional: He is oriented to  person, place, and time. He appears well-developed and well-nourished. No distress.  HENT:  Head: Normocephalic and atraumatic.  Right Ear: Hearing normal.  Left Ear: Hearing normal.  Nose: Nose normal.  Mouth/Throat: Oropharynx is clear and moist and mucous membranes are normal.  Eyes: Conjunctivae and EOM are normal. Pupils are equal, round, and reactive to light.  Neck: Normal range of motion. Neck supple.  Cardiovascular: Regular rhythm, S1 normal and S2 normal.  Exam reveals no gallop and no friction rub.   No murmur heard. Pulmonary/Chest: Effort normal. Tachypnea noted. No respiratory distress. He has decreased breath sounds. He exhibits no tenderness.  Abdominal:  Soft. Normal appearance. He exhibits distension. There is no hepatosplenomegaly. There is tenderness. There is guarding. There is no rebound, no tenderness at McBurney's point and negative Murphy's sign. No hernia.  Decreased bowel sounds Diffusely tender Tenderness to percussion Tympanitic  Musculoskeletal: Normal range of motion.  Neurological: He is alert and oriented to person, place, and time. He has normal strength. No cranial nerve deficit or sensory deficit. Coordination normal. GCS eye subscore is 4. GCS verbal subscore is 5. GCS motor subscore is 6.  Skin: Skin is warm, dry and intact. No rash noted. No cyanosis.  Psychiatric: He has a normal mood and affect. His speech is normal and behavior is normal. Thought content normal.  Nursing note and vitals reviewed.   ED Course  Procedures   DIAGNOSTIC STUDIES: Oxygen Saturation is 96% on RA, normal by my interpretation.    COORDINATION OF CARE: 12:16 PM Discussed treatment plan which includes lab work, abdominal/chest XR, EKG, Dilaudid, and IVF with pt at bedside and pt agreed to plan.  Labs Review-  Labs Reviewed  CBC WITH DIFFERENTIAL/PLATELET - Abnormal; Notable for the following:    WBC 19.4 (*)    Neutrophils Relative % 90 (*)    Neutro Abs 17.3 (*)    Lymphocytes Relative 6 (*)    All other components within normal limits  COMPREHENSIVE METABOLIC PANEL - Abnormal; Notable for the following:    Sodium 134 (*)    Glucose, Bld 253 (*)    BUN 30 (*)    Creatinine, Ser 2.25 (*)    GFR calc non Af Amer 29 (*)    GFR calc Af Amer 33 (*)    All other components within normal limits  TROPONIN I - Abnormal; Notable for the following:    Troponin I 0.37 (*)    All other components within normal limits  LIPASE, BLOOD - Abnormal; Notable for the following:    Lipase 14 (*)    All other components within normal limits  URINALYSIS, ROUTINE W REFLEX MICROSCOPIC (NOT AT [facility]) - Abnormal; Notable for the following:    Color,  Urine AMBER (*)    APPearance HAZY (*)    Specific Gravity, Urine >1.030 (*)    Bilirubin Urine MODERATE (*)    Ketones, ur 15 (*)    Protein, ur 100 (*)    All other components within normal limits  URINE MICROSCOPIC-ADD ON - Abnormal; Notable for the following:    Squamous Epithelial / LPF FEW (*)    Bacteria, UA MANY (*)    All other components within normal limits  I-STAT CG4 LACTIC ACID, ED - Abnormal; Notable for the following:    Lactic Acid, Venous 2.82 (*)    All other components within normal limits  URINE CULTURE  BRAIN NATRIURETIC PEPTIDE  TROPONIN I    Imaging Review Ct Abdomen  Pelvis Wo Contrast  06/10/2015   CLINICAL DATA:  Abdominal pain, constipation. Bright red blood in stool.  EXAM: CT ABDOMEN AND PELVIS WITHOUT CONTRAST  TECHNIQUE: Multidetector CT imaging of the abdomen and pelvis was performed following the standard protocol without IV contrast.  COMPARISON:  None.  FINDINGS: The transverse colon and descending colon are decompressed which limits characterization of their walls but I suspect mild thickening of the walls of the descending colon. There is also subtle inflammatory stranding within the paracolic fat adjacent to the descending colon and trace free fluid within the lower left paracolic gutter.  Scattered diverticulosis noted within the descending and sigmoid colon, most prominent in the sigmoid colon, but no focal inflammatory change to suggest acute diverticulitis. Large and small bowel are normal in caliber throughout.  Liver, spleen, pancreas, gallbladder, and adrenal glands are within normal limits for a noncontrast study. There are 2 nonobstructing stones in the left renal pelvis, largest measuring 6 mm. No associated hydronephrosis. 10 mm hypodense mass exophytic to the lateral cortex of the right kidney is too small to characterize but probably a small cyst. No right- sided renal stone or hydronephrosis. No ureteral or bladder calculi.  No free fluid or  abscess collection. No free intraperitoneal air. Scattered atherosclerotic changes are seen along the walls of the normal- caliber abdominal aorta.  There is mild dependent atelectasis within the lower lungs bilaterally. 7 mm pulmonary nodule is also identified within the right lower lobe on axial image 5.  IMPRESSION: 1. Probable mild thickening of the walls of the descending colon suggesting a mild colitis of infectious or inflammatory nature. Associated subtle inflammatory stranding within the paracolic fat and trace free fluid in the lower left paracolic gutter. 2. Colonic diverticulosis without evidence of acute diverticulitis. 3. 7 mm pulmonary nodule within the right lower lobe, too small to definitively characterize. If the patient is at high risk for bronchogenic carcinoma, follow-up chest CT at 3-28months is recommended. If the patient is at low risk for bronchogenic carcinoma, follow-up chest CT at 6-12 months is recommended. This recommendation follows the consensus statement: Guidelines for Management of Small Pulmonary Nodules Detected on CT Scans: A Statement from the [place] as published in Radiology 2005; 237:395-400. 4. 1 cm mass exophytic to the lateral cortex of the right kidney. This is also too small to definitively characterize but most likely a benign cyst based on CT density measurements. Would consider follow-up renal ultrasound at some point to ensure benignity. 5. Left nephrolithiasis. 6. No bowel obstruction. No abscess collection. No free intraperitoneal air. These results were called by telephone at the time of interpretation on 06/10/2015 at 4:24 pm to Dr. Joseph Berkshire , who verbally acknowledged these results.   Electronically Signed   By: Franki Cabot M.D.   On: 06/10/2015 16:25   Dg Abd Acute W/chest  06/10/2015   CLINICAL DATA:  Sudden onset of severe abdominal pain at 0300 hours, abdominal distension, history hypertension, diverticulitis, former smoker  EXAM:  DG ABDOMEN ACUTE W/ 1V CHEST  COMPARISON:  None  FINDINGS: Enlargement of cardiac silhouette.  Mediastinal contours and pulmonary vascularity normal.  Lungs clear.  No pleural effusion or pneumothorax.  Mild gaeous distention of stomach.  Question wall thickening of duodenum.  Nonobstructive bowel gas pattern otherwise seen.  No bowel dilatation or free air.  No urinary tract calcification or bone abnormality.  IMPRESSION: Enlargement of cardiac silhouette.  Mild gaseous distention of stomach with questionable wall thickening of the descending  duodenum.  Otherwise normal bowel gas pattern.   Electronically Signed   By: Lavonia Dana M.D.   On: 06/10/2015 13:23    EKG Interpretation  Date/Time:  Friday June 10 2015 12:37:27 EDT Ventricular Rate:  65 PR Interval:  159 QRS Duration: 91 QT Interval:  412 QTC Calculation: 428 R Axis:   -72 Text Interpretation:  Sinus rhythm Abnormal R-wave progression, late transition Inferior infarct, old No previous tracing Confirmed by Hanif Radin  MD, Trishia Cuthrell [phone]) on 06/10/2015 1:48:34 PM       MDM   Final diagnoses:  Abdominal pain, generalized  Abdominal pain, acute  Colitis  UTI (lower urinary tract infection)  Elevated troponin   Patient presents to the ER for evaluation of severe lower abdominal pain. Patient felt like he was constipated earlier today, took lactulose and enema. This caused diarrhea, but did not improve his pain. He has been having progressively worsening pain, now constant and severe. Patient does report a previous history of clinically diagnosed diverticulitis. CT scan today shows colitis that likely explains the abdominal complaints.  Patient was mildly short of breath and borderline hypoxic at arrival. He appeared ill, diaphoretic, but was not experiencing chest pain. EKG does not show obvious ischemia. Troponin, however, is elevated.  Discussed with Dr. Johnsie Cancel, on call for cardiology. He recommends treatment of the colitis, no  cardiac treatment at this time. Does not require transferred to [facility], anticoagulation, etc.  Patient will be admitted to the hospital here. He was initiated on IV fluids, Cipro, Flagyl.  I personally performed the services described in this documentation, which was scribed in my presence. The recorded information has been reviewed and is accurate.      Sergio Greek, MD 06/10/15 1650

## 2015-06-10 NOTE — Progress Notes (Signed)
ANTICOAGULATION CONSULT NOTE - Initial Consult  Pharmacy Consult for Heparin Indication: chest pain/ACS  Allergies  Allergen Reactions  . Sulfa Antibiotics Itching    Patient Measurements: Height: 5\' 11"  (180.3 cm) Weight: (!) 312 lb 9.6 oz (141.794 kg) IBW/kg (Calculated) : 75.3 Heparin Dosing Weight: 108.4 kg  Vital Signs: Temp: 97.6 F (36.4 C) (07/22 1810) Temp Source: Oral (07/22 1810) BP: 189/71 mmHg (07/22 1810) Pulse Rate: 69 (07/22 1810)  Labs:  Recent Labs  06/10/15 1225 06/10/15 1657  HGB 16.5  --   HCT 50.5  --   PLT 268  --   CREATININE 2.25*  --   TROPONINI 0.37* 0.95*    Estimated Creatinine Clearance: 47.2 mL/min (by C-G formula based on Cr of 2.25).   Medical History: Past Medical History  Diagnosis Date  . Hypertension   . Reflux   . Glaucoma   . Hyperlipidemia     Medications:  Prescriptions prior to admission  Medication Sig Dispense Refill Last Dose  . aspirin EC 81 MG tablet Take 162 mg by mouth daily.   06/10/2015 at Unknown time  . buPROPion (WELLBUTRIN XL) 300 MG 24 hr tablet Take 300 mg by mouth at bedtime.   06/09/2015 at Unknown time  . clonazePAM (KLONOPIN) 0.5 MG tablet Take 0.5 mg by mouth 2 (two) times daily.   06/10/2015 at Unknown time  . latanoprost (XALATAN) 0.005 % ophthalmic solution Place 1 drop into both eyes at bedtime.   06/09/2015 at Unknown time  . losartan (COZAAR) 50 MG tablet Take 50 mg by mouth daily.   06/10/2015 at Unknown time  . metoprolol succinate (TOPROL-XL) 25 MG 24 hr tablet Take 25 mg by mouth daily.   06/10/2015 at 800  . Omega-3 Fatty Acids (FISH OIL) 1000 MG CAPS Take 1,000 mg by mouth daily.   06/09/2015 at Unknown time  . promethazine (PHENERGAN) 25 MG tablet Take 25 mg by mouth every 6 (six) hours as needed for nausea or vomiting.   06/10/2015 at Unknown time  . rosuvastatin (CRESTOR) 20 MG tablet Take 20 mg by mouth every evening.   06/09/2015 at Unknown time  . sucralfate (CARAFATE) 1 G tablet Take 1  g by mouth 4 (four) times daily -  with meals and at bedtime.   06/10/2015 at Unknown time  . Vitamin D, Cholecalciferol, 1000 UNITS TABS Take 1,000 mg by mouth daily.   06/09/2015 at Unknown time    Assessment: ED Patient was mildly short of breath and borderline hypoxic at arrival. He appeared ill, diaphoretic, but was not experiencing chest pain. EKG does not show obvious ischemia. Troponin elevated in ED Patient initially admitted for treatment for colitis, no cardiac treatment on admission. Second troponin elevated after admission, heparin per pharmacy consult PTA medications reviewed Labs reviewed  Goal of Therapy:  Heparin level 0.3-0.7 units/ml Monitor platelets by anticoagulation protocol: Yes   Plan:  Give 4000 units bolus x 1 Start heparin infusion at 1500 units/hr Check anti-Xa level in 8 hours and daily while on heparin Continue to monitor H&H and platelets  Sergio Griffin, Sergio Griffin 06/10/2015,8:14 PM

## 2015-06-11 DIAGNOSIS — I472 Ventricular tachycardia: Secondary | ICD-10-CM

## 2015-06-11 DIAGNOSIS — R000 Tachycardia, unspecified: Secondary | ICD-10-CM

## 2015-06-11 DIAGNOSIS — N390 Urinary tract infection, site not specified: Secondary | ICD-10-CM | POA: Diagnosis present

## 2015-06-11 DIAGNOSIS — A419 Sepsis, unspecified organism: Secondary | ICD-10-CM | POA: Diagnosis not present

## 2015-06-11 DIAGNOSIS — R7989 Other specified abnormal findings of blood chemistry: Secondary | ICD-10-CM

## 2015-06-11 DIAGNOSIS — R778 Other specified abnormalities of plasma proteins: Secondary | ICD-10-CM | POA: Diagnosis present

## 2015-06-11 DIAGNOSIS — R1084 Generalized abdominal pain: Secondary | ICD-10-CM | POA: Diagnosis present

## 2015-06-11 DIAGNOSIS — I2510 Atherosclerotic heart disease of native coronary artery without angina pectoris: Secondary | ICD-10-CM | POA: Diagnosis present

## 2015-06-11 DIAGNOSIS — R109 Unspecified abdominal pain: Secondary | ICD-10-CM | POA: Diagnosis present

## 2015-06-11 DIAGNOSIS — R911 Solitary pulmonary nodule: Secondary | ICD-10-CM

## 2015-06-11 DIAGNOSIS — E875 Hyperkalemia: Secondary | ICD-10-CM | POA: Diagnosis present

## 2015-06-11 LAB — BASIC METABOLIC PANEL
Anion gap: 11 (ref 5–15)
BUN: 28 mg/dL — AB (ref 6–20)
CO2: 18 mmol/L — AB (ref 22–32)
Calcium: 8.3 mg/dL — ABNORMAL LOW (ref 8.9–10.3)
Chloride: 103 mmol/L (ref 101–111)
Creatinine, Ser: 1.61 mg/dL — ABNORMAL HIGH (ref 0.61–1.24)
GFR calc non Af Amer: 43 mL/min — ABNORMAL LOW (ref >60)
GFR, EST AFRICAN AMERICAN: 50 mL/min — AB (ref >60)
Glucose, Bld: 138 mg/dL — ABNORMAL HIGH (ref 65–99)
POTASSIUM: 6 mmol/L — AB (ref 3.5–5.1)
Sodium: 132 mmol/L — ABNORMAL LOW (ref 135–145)

## 2015-06-11 LAB — HEPARIN LEVEL (UNFRACTIONATED)
HEPARIN UNFRACTIONATED: 0.35 [IU]/mL (ref 0.30–0.70)
Heparin Unfractionated: 0.21 IU/mL — ABNORMAL LOW (ref 0.30–0.70)
Heparin Unfractionated: 0.22 IU/mL — ABNORMAL LOW (ref 0.30–0.70)

## 2015-06-11 LAB — COMPREHENSIVE METABOLIC PANEL
ALT: 21 U/L (ref 17–63)
ANION GAP: 13 (ref 5–15)
AST: 32 U/L (ref 15–41)
Albumin: 3.4 g/dL — ABNORMAL LOW (ref 3.5–5.0)
Alkaline Phosphatase: 59 U/L (ref 38–126)
BUN: 31 mg/dL — ABNORMAL HIGH (ref 6–20)
CALCIUM: 8.2 mg/dL — AB (ref 8.9–10.3)
CO2: 16 mmol/L — ABNORMAL LOW (ref 22–32)
CREATININE: 1.63 mg/dL — AB (ref 0.61–1.24)
Chloride: 103 mmol/L (ref 101–111)
GFR, EST AFRICAN AMERICAN: 49 mL/min — AB (ref >60)
GFR, EST NON AFRICAN AMERICAN: 43 mL/min — AB (ref >60)
GLUCOSE: 135 mg/dL — AB (ref 65–99)
Potassium: 5.9 mmol/L — ABNORMAL HIGH (ref 3.5–5.1)
Sodium: 132 mmol/L — ABNORMAL LOW (ref 135–145)
TOTAL PROTEIN: 6.2 g/dL — AB (ref 6.5–8.1)
Total Bilirubin: 1.4 mg/dL — ABNORMAL HIGH (ref 0.3–1.2)

## 2015-06-11 LAB — MRSA PCR SCREENING: MRSA by PCR: NEGATIVE

## 2015-06-11 LAB — LIPID PANEL
Cholesterol: 114 mg/dL (ref 0–200)
HDL: 45 mg/dL (ref >40)
LDL Cholesterol: 54 mg/dL (ref 0–99)
Total CHOL/HDL Ratio: 2.5 RATIO
Triglycerides: 73 mg/dL (ref <150)
VLDL: 15 mg/dL (ref 0–40)

## 2015-06-11 LAB — TROPONIN I
Troponin I: 0.48 ng/mL — ABNORMAL HIGH (ref <0.031)
Troponin I: 0.32 ng/mL — ABNORMAL HIGH (ref <0.031)
Troponin I: 0.52 ng/mL (ref <0.031)
Troponin I: 0.3 ng/mL — ABNORMAL HIGH (ref <0.031)

## 2015-06-11 LAB — CBC
HCT: 44 % (ref 39.0–52.0)
Hemoglobin: 14.5 g/dL (ref 13.0–17.0)
MCH: 28.8 pg (ref 26.0–34.0)
MCHC: 33 g/dL (ref 30.0–36.0)
MCV: 87.5 fL (ref 78.0–100.0)
PLATELETS: 238 10*3/uL (ref 150–400)
RBC: 5.03 MIL/uL (ref 4.22–5.81)
RDW: 14.9 % (ref 11.5–15.5)
WBC: 21.2 10*3/uL — ABNORMAL HIGH (ref 4.0–10.5)

## 2015-06-11 LAB — MAGNESIUM: Magnesium: 2.6 mg/dL — ABNORMAL HIGH (ref 1.7–2.4)

## 2015-06-11 MED ORDER — OMEGA-3-ACID ETHYL ESTERS 1 G PO CAPS
1.0000 g | ORAL_CAPSULE | Freq: Every day | ORAL | Status: DC
  Administered 2015-06-11 – 2015-06-22 (×12): 1 g via ORAL
  Filled 2015-06-11 (×12): qty 1

## 2015-06-11 MED ORDER — SODIUM BICARBONATE 8.4 % IV SOLN
50.0000 meq | Freq: Once | INTRAVENOUS | Status: AC
  Administered 2015-06-11: 50 meq via INTRAVENOUS
  Filled 2015-06-11: qty 50

## 2015-06-11 MED ORDER — LOSARTAN POTASSIUM 50 MG PO TABS
50.0000 mg | ORAL_TABLET | Freq: Every day | ORAL | Status: DC
  Filled 2015-06-11: qty 1

## 2015-06-11 MED ORDER — ASPIRIN EC 81 MG PO TBEC
162.0000 mg | DELAYED_RELEASE_TABLET | Freq: Every day | ORAL | Status: DC
  Administered 2015-06-11 – 2015-06-22 (×12): 162 mg via ORAL
  Filled 2015-06-11 (×12): qty 2

## 2015-06-11 MED ORDER — CHLORHEXIDINE GLUCONATE 0.12 % MT SOLN
15.0000 mL | Freq: Two times a day (BID) | OROMUCOSAL | Status: DC
  Administered 2015-06-11 – 2015-06-22 (×20): 15 mL via OROMUCOSAL
  Filled 2015-06-11 (×24): qty 15

## 2015-06-11 MED ORDER — LATANOPROST 0.005 % OP SOLN
1.0000 [drp] | Freq: Every day | OPHTHALMIC | Status: DC
  Administered 2015-06-11 – 2015-06-21 (×11): 1 [drp] via OPHTHALMIC
  Filled 2015-06-11: qty 2.5

## 2015-06-11 MED ORDER — PROMETHAZINE HCL 25 MG PO TABS
25.0000 mg | ORAL_TABLET | Freq: Four times a day (QID) | ORAL | Status: DC | PRN
  Administered 2015-06-11: 25 mg via ORAL
  Filled 2015-06-11: qty 1

## 2015-06-11 MED ORDER — SODIUM CHLORIDE 0.9 % IV SOLN
2.0000 g | Freq: Once | INTRAVENOUS | Status: AC
  Administered 2015-06-11: 2 g via INTRAVENOUS
  Filled 2015-06-11: qty 20

## 2015-06-11 MED ORDER — NITROGLYCERIN 0.4 MG SL SUBL: 0.4000 mg | SUBLINGUAL_TABLET | SUBLINGUAL | Status: DC | PRN

## 2015-06-11 MED ORDER — METOPROLOL TARTRATE 12.5 MG HALF TABLET
12.5000 mg | ORAL_TABLET | Freq: Two times a day (BID) | ORAL | Status: DC
  Filled 2015-06-11: qty 1

## 2015-06-11 MED ORDER — CLONAZEPAM 0.5 MG PO TABS
0.5000 mg | ORAL_TABLET | Freq: Two times a day (BID) | ORAL | Status: DC
  Administered 2015-06-11 – 2015-06-22 (×24): 0.5 mg via ORAL
  Filled 2015-06-11 (×24): qty 1

## 2015-06-11 MED ORDER — METOPROLOL SUCCINATE ER 25 MG PO TB24
25.0000 mg | ORAL_TABLET | Freq: Every day | ORAL | Status: DC
  Administered 2015-06-11: 25 mg via ORAL
  Filled 2015-06-11: qty 1

## 2015-06-11 MED ORDER — ROSUVASTATIN CALCIUM 20 MG PO TABS
20.0000 mg | ORAL_TABLET | Freq: Every evening | ORAL | Status: DC
  Administered 2015-06-11 – 2015-06-20 (×10): 20 mg via ORAL
  Filled 2015-06-11 (×12): qty 1

## 2015-06-11 MED ORDER — HYDROMORPHONE HCL 1 MG/ML IJ SOLN
1.0000 mg | INTRAMUSCULAR | Status: DC | PRN
  Administered 2015-06-11 – 2015-06-14 (×10): 1 mg via INTRAVENOUS
  Filled 2015-06-11 (×10): qty 1

## 2015-06-11 MED ORDER — ACETAMINOPHEN 325 MG PO TABS
650.0000 mg | ORAL_TABLET | ORAL | Status: DC | PRN
  Administered 2015-06-15: 650 mg via ORAL
  Filled 2015-06-11 (×2): qty 2

## 2015-06-11 MED ORDER — HEPARIN BOLUS VIA INFUSION
2000.0000 [IU] | Freq: Once | INTRAVENOUS | Status: AC
  Administered 2015-06-11: 2000 [IU] via INTRAVENOUS
  Filled 2015-06-11: qty 2000

## 2015-06-11 MED ORDER — METOPROLOL TARTRATE 12.5 MG HALF TABLET
12.5000 mg | ORAL_TABLET | Freq: Two times a day (BID) | ORAL | Status: DC
  Administered 2015-06-12 – 2015-06-22 (×21): 12.5 mg via ORAL
  Filled 2015-06-11 (×22): qty 1

## 2015-06-11 MED ORDER — CETYLPYRIDINIUM CHLORIDE 0.05 % MT LIQD
7.0000 mL | Freq: Two times a day (BID) | OROMUCOSAL | Status: DC
  Administered 2015-06-11 – 2015-06-22 (×14): 7 mL via OROMUCOSAL

## 2015-06-11 MED ORDER — SODIUM POLYSTYRENE SULFONATE 15 GM/60ML PO SUSP
45.0000 g | Freq: Once | ORAL | Status: AC
  Administered 2015-06-11: 45 g via ORAL
  Filled 2015-06-11: qty 180

## 2015-06-11 MED ORDER — SUCRALFATE 1 G PO TABS
1.0000 g | ORAL_TABLET | Freq: Three times a day (TID) | ORAL | Status: DC
  Administered 2015-06-11 – 2015-06-16 (×15): 1 g via ORAL
  Filled 2015-06-11 (×30): qty 1

## 2015-06-11 MED ORDER — SODIUM BICARBONATE 8.4 % IV SOLN
INTRAVENOUS | Status: AC
Start: 2015-06-11 — End: 2015-06-11
  Filled 2015-06-11: qty 50

## 2015-06-11 MED ORDER — ASPIRIN 81 MG PO CHEW
324.0000 mg | CHEWABLE_TABLET | ORAL | Status: AC
Start: 2015-06-11 — End: 2015-06-11
  Administered 2015-06-11: 324 mg via ORAL
  Filled 2015-06-11: qty 4

## 2015-06-11 MED ORDER — ASPIRIN 300 MG RE SUPP
300.0000 mg | RECTAL | Status: AC
  Filled 2015-06-11: qty 1

## 2015-06-11 MED ORDER — METOPROLOL TARTRATE 1 MG/ML IV SOLN: 5.0000 mg | Freq: Four times a day (QID) | INTRAVENOUS | Status: DC | PRN

## 2015-06-11 MED ORDER — BUPROPION HCL ER (XL) 300 MG PO TB24
300.0000 mg | ORAL_TABLET | Freq: Every day | ORAL | Status: DC
  Administered 2015-06-11 – 2015-06-21 (×11): 300 mg via ORAL
  Filled 2015-06-11 (×14): qty 1

## 2015-06-11 MED ORDER — ONDANSETRON HCL 4 MG/2ML IJ SOLN
4.0000 mg | Freq: Four times a day (QID) | INTRAMUSCULAR | Status: DC | PRN
  Administered 2015-06-11 – 2015-06-13 (×2): 4 mg via INTRAVENOUS
  Filled 2015-06-11 (×2): qty 2

## 2015-06-11 NOTE — Progress Notes (Signed)
ANTICOAGULATION CONSULT NOTE - Follow Up Consult  Pharmacy Consult for Heparin Indication: chest pain/ACS  Allergies  Allergen Reactions  . Sulfa Antibiotics Itching    Patient Measurements: Height: 5\' 11"  (180.3 cm) Weight: (!) 308 lb 13.8 oz (140.1 kg) IBW/kg (Calculated) : 75.3 Heparin Dosing Weight:   Vital Signs: Temp: 98.2 F (36.8 C) (07/23 1114) Temp Source: Oral (07/23 1114) BP: 136/65 mmHg (07/23 1114) Pulse Rate: 78 (07/23 1114)  Labs:  Recent Labs  06/10/15 1222  06/10/15 1225 06/10/15 1657 06/11/15 0247 06/11/15 0730 06/11/15 1200 06/11/15 1410  HGB  --   --  16.5  --   --  14.5  --   --   HCT  --   --  50.5  --   --  44.0  --   --   PLT  --   --  268  --   --  238  --   --   LABPROT 14.2  --   --   --   --   --   --   --   INR 1.08  --   --   --   --   --   --   --   HEPARINUNFRC  --   --   --   --  0.21*  --   --  0.35  CREATININE  --   --  2.25*  --   --  1.61* 1.63*  --   TROPONINI  --   < > 0.37* 0.95* 0.48* 0.32*  --   --   < > = values in this interval not displayed.  Estimated Creatinine Clearance: 64.7 mL/min (by C-G formula based on Cr of 1.63).   Medications:  Scheduled:  . aspirin EC  162 mg Oral Daily  . buPROPion  300 mg Oral QHS  . ciprofloxacin  400 mg Intravenous Q12H  . clonazePAM  0.5 mg Oral BID  . latanoprost  1 drop Both Eyes QHS  . metoprolol succinate  25 mg Oral Daily  . metroNIDAZOLE  500 mg Intravenous 4 times per day  . nitroGLYCERIN  1 inch Topical 4 times per day  . omega-3 acid ethyl esters  1 g Oral Daily  . rosuvastatin  20 mg Oral QPM  . sucralfate  1 g Oral TID WC & HS    Assessment: 64yo male admitted with infectious colitis with bloody diarrhea and chest pain.  Heparin was adjusted this AM for low heparin level and is now therapeutic on 1850 units/hr.  Hg and pltc are wnl.  There has been no further bloody diarrhea per d/w RN.  Goal of Therapy:  Heparin level 0.3-0.7 units/ml Monitor platelets by  anticoagulation protocol: Yes   Plan:  Continue Heparin 1850 units/hr Repeat HL at 8PM to verify therapeutic Watch for s/s of bleeding  Gracy Bruins, PharmD [facility]

## 2015-06-11 NOTE — Plan of Care (Signed)
Report called to Stepdown at [facility] has also recieved report and will be arriving shortly.

## 2015-06-11 NOTE — Plan of Care (Signed)
Dr notified of pt High BP readings. Requested BP PRN for transport to [facility]. Waiting on response.

## 2015-06-11 NOTE — Progress Notes (Signed)
[place] TEAM 1 - Stepdown/ICU TEAM Progress Note  BENNO BRENSINGER TFT:[id number] DOB: 06-Aug-1951 DOA: 06/10/2015 PCP: No primary care provider on file.  Admit HPI / Brief Narrative: 64 y.o. WM PMHx HTN, HLD, CAD withcoronary angiogram with stenting approximately 5-6 years ago, GERD.   Cardiologist up in [place] that he follows with yearly. He presents to the hospital with increasing abdominal pain, bloody diarrhea that started at approximately 1 this morning. He has had multiple episodes of bloody diarrhea that has progressed throughout the morning, accompanied by feeling extremely ill with left-sided, substernal chest pain, diaphoresis, cold sweats, and retching. At approximately 11 AM, the patient decided to present to the emergency room for evaluation. He has had no bloody diarrhea since that time.  With his chest pain, the patient has had left-sided, substernal chest pain doubt radiation, associated with diaphoresis, cold sweats, nausea. He had the chest pain for approximately 6-7 hours, but this has gradually trailed off since being at the hospital. He currently reports no chest pain.    HPI/Subjective: 7/23 A/O 4, continued mild abdominal pain, negative CP, negative SOB. Positive melanic stool.  Assessment/Plan: NSTEMI - Admitting physician discussed this patient with Dr. Tommi Rumps of (cardiology) recommended starting anticoagulation. -Continue heparin drip; has bloody infectious colitis. May have to DC heparin if continues.  -Continue Nitropaste; currently CP free -Continue to trend troponin -Echocardiogram pending -Daily EKG -Lipid panel within ADA guidelines; however started on Crestor 20 mg daily per cardiology  HTN -DC Toprol XL 25 mg daily -Start metoprolol 12.5 mg BID -Continue Nitropaste  Pulmonary nodule (previous smoker) -7 mm pulmonary nodule in RLL -noncontrast chest CT pending fully evaluate all lung fields -Currently the patient is without chest  pain  Infectious Colitis -Continue Cipro and Flagyl -Stool culture pending -Continue normal saline 172ml/hr -Maintain NPO  UTI -Urine culture pending -Typical organisms would be covered by current antibiotics  Acute on Chronic kidney failure (no previous Cr for baseline) -Continue hydration -Cr trending down -Hold all nephrotoxic medication  lactic acidosis -Trend lactic acid -Continue hydrate aggressively  Hyperkalemia -Calcium gluconate IV 2 gm  -Sodium bicarbonate IV 1 Amp (meq) -Kayexalate 45 gm 1   Code Status: FULL Family Communication: family present at time of exam Disposition Plan: Per cardiology    Consultants: Dr.Daniel Tommi Rumps (cardiology)    Procedure/Significant Events: 7/22 CT abdomen pelvis without contrast;-Probable mild thickening walls of descending colon mild colitis   infectious vs inflammatory nature. -Associated subtle inflammatory stranding W/I paracolic fat and trace free fluid in the lower left paracolic gutter. -7 mm pulmonary nodule within the RLL  -1 cm mass exophytic to the lateral cortex of the right kidney; most likely benign cyst -Left nephrolithiasis.   Culture 7/22 urine pending 7/23 blood pending  Antibiotics: Ciprofloxacin 7/22>> Flagyl 7/22>>  DVT prophylaxis: Heparin drip   Devices    LINES / TUBES:      Continuous Infusions: . sodium chloride 125 mL/hr at 06/11/15 1920  . heparin 1,850 Units/hr (06/11/15 1920)    Objective: VITAL SIGNS: Temp: 98.2 F (36.8 C) (07/23 1908) Temp Source: Oral (07/23 1908) BP: 145/66 mmHg (07/23 1908) Pulse Rate: 73 (07/23 1908) SPO2; FIO2:   Intake/Output Summary (Last 24 hours) at 06/11/15 1952 Last data filed at 06/11/15 1920  Gross per 24 hour  Intake 2158.34 ml  Output    650 ml  Net 1508.34 ml     Exam: General: A/O 4, NAD, No acute respiratory distress Eyes: Negative headache, eye pain, double  vision, negative scleral hemorrhage ENT: Negative  Runny nose, negative ear pain, negative tinnitus, negative gingival bleeding Neck:  Negative scars, masses, torticollis, lymphadenopathy, JVD Lungs: Clear to auscultation bilaterally without wheezes or crackles Cardiovascular: Regular rate and rhythm without murmur gallop or rub normal S1 and S2 Abdomen:  Morbidly obese positive abdominal pain worse in LUQ/LLQ, negative dysphagia, soft, bowel sounds positive, no rebound, no ascites, no appreciable mass Extremities: No significant cyanosis, clubbing, or edema bilateral lower extremities Psychiatric:  Negative depression, negative anxiety, negative fatigue, negative mania  Neurologic:  Cranial nerves II through XII intact, tongue/uvula midline, all extremities muscle strength 5/5, sensation intact throughout,  negative dysarthria, negative expressive aphasia, negative receptive aphasia.     Data Reviewed: Basic Metabolic Panel:  Recent Labs Lab 06/10/15 1225 06/11/15 0730 06/11/15 1200  NA 134* 132* 132*  K 5.1 6.0* 5.9*  CL 101 103 103  CO2 23 18* 16*  GLUCOSE 253* 138* 135*  BUN 30* 28* 31*  CREATININE 2.25* 1.61* 1.63*  CALCIUM 9.5 8.3* 8.2*  MG  --   --  2.6*   Liver Function Tests:  Recent Labs Lab 06/10/15 1225 06/11/15 1200  AST 25 32  ALT 24 21  ALKPHOS 49 59  BILITOT 0.8 1.4*  PROT 7.0 6.2*  ALBUMIN 4.1 3.4*    Recent Labs Lab 06/10/15 1225  LIPASE 14*   No results for input(s): AMMONIA in the last 168 hours. CBC:  Recent Labs Lab 06/10/15 1225 06/11/15 0730  WBC 19.4* 21.2*  NEUTROABS 17.3*  --   HGB 16.5 14.5  HCT 50.5 44.0  MCV 87.8 87.5  PLT 268 238   Cardiac Enzymes:  Recent Labs Lab 06/10/15 1225 06/10/15 1657 06/11/15 0247 06/11/15 0730 06/11/15 1338  TROPONINI 0.37* 0.95* 0.48* 0.32* 0.52*   BNP (last 3 results)  Recent Labs  06/10/15 1225  BNP 16.0    ProBNP (last 3 results) No results for input(s): PROBNP in the last 8760 hours.  CBG: No results for input(s): GLUCAP  in the last 168 hours.  Recent Results (from the past 240 hour(s))  MRSA PCR Screening     Status: None   Collection Time: 06/11/15  1:55 AM  Result Value Ref Range Status   MRSA by PCR NEGATIVE NEGATIVE Final    Comment:        The GeneXpert MRSA Assay (FDA approved for NASAL specimens only), is one component of a comprehensive MRSA colonization surveillance program. It is not intended to diagnose MRSA infection nor to guide or monitor treatment for MRSA infections.      Studies:  Recent x-ray studies have been reviewed in detail by the Attending Physician  Scheduled Meds:  Scheduled Meds: . antiseptic oral rinse  7 mL Mouth Rinse q12n4p  . aspirin EC  162 mg Oral Daily  . buPROPion  300 mg Oral QHS  . chlorhexidine  15 mL Mouth Rinse BID  . ciprofloxacin  400 mg Intravenous Q12H  . clonazePAM  0.5 mg Oral BID  . latanoprost  1 drop Both Eyes QHS  . metoprolol succinate  25 mg Oral Daily  . metroNIDAZOLE  500 mg Intravenous 4 times per day  . nitroGLYCERIN  1 inch Topical 4 times per day  . omega-3 acid ethyl esters  1 g Oral Daily  . rosuvastatin  20 mg Oral QPM  . sucralfate  1 g Oral TID WC & HS    Time spent on care of this patient: 40 mins  Allie Bossier , MD  [facility] Office  [phone] Pager [phone]  On-Call/Text Page:      [email or web address]      password [email or web address]  If 7PM-7AM, please contact night-coverage [email or web address] Password [email or web address] 06/11/2015, 7:52 PM   LOS: 1 day   Care during the described time interval was provided by me .  I have reviewed this patient's available data, including medical history, events of note, physical examination, and all test results as part of my evaluation. I have personally reviewed and interpreted all radiology studies.   Dia Crawford, MD [phone] Pager

## 2015-06-11 NOTE — Progress Notes (Signed)
06/11/15  Pharmacy- Heparin 2050  Heparin level 0.22 on 1850 units/hr  A/P  64yo male with NSTEMI.  Heparin level down on current rate.  No further bloody diarrhea and no problems with pump; it is running at correct rate.    1.  Heparin 2000 units IV x 1, inc to 2100 units/hr 2.  Repeat Heparin level 8hr 3.  Watch for s/s of bleeding.  Gracy Bruins, PharmD Clinical Pharmacist [facility]

## 2015-06-11 NOTE — Progress Notes (Signed)
[place] for Heparin Indication: chest pain/ACS  Allergies  Allergen Reactions  . Sulfa Antibiotics Itching    Patient Measurements: Height: 5\' 11"  (180.3 cm) Weight: (!) 308 lb 13.8 oz (140.1 kg) IBW/kg (Calculated) : 75.3 Heparin Dosing Weight: 108.4 kg  Vital Signs: Temp: 98.5 F (36.9 C) (07/23 0123) Temp Source: Oral (07/23 0123) BP: 132/63 mmHg (07/23 0301) Pulse Rate: 83 (07/23 0301)  Labs:  Recent Labs  06/10/15 1222 06/10/15 1225 06/10/15 1657 06/11/15 0247  HGB  --  16.5  --   --   HCT  --  50.5  --   --   PLT  --  268  --   --   LABPROT 14.2  --   --   --   INR 1.08  --   --   --   HEPARINUNFRC  --   --   --  0.21*  CREATININE  --  2.25*  --   --   TROPONINI  --  0.37* 0.95* 0.48*    Estimated Creatinine Clearance: 46.9 mL/min (by C-G formula based on Cr of 2.25).  Assessment: 64 y.o. male with elevated cardiac markers for heparin  Goal of Therapy:  Heparin level 0.3-0.7 units/ml Monitor platelets by anticoagulation protocol: Yes   Plan:  Heparin 2000 units IV bolus, then increase heparin 1850 units/hr Check heparin level in 6 hours.   Caryl Pina 06/11/2015,3:44 AM

## 2015-06-11 NOTE — Progress Notes (Signed)
SUBJECTIVE: Patient was admitted with infectious colitis. Troponin was elevated and peaked at 0.95. This occurred in the setting of hypoperfusion and lactic acidosis. He is stable today. He is receiving IV fluids. At the same time he does have peripheral edema. There is no shortness of breath. Potassium has gone up to 6.0. Currently he is not receiving any exogenous potassium.   Filed Vitals:   06/11/15 0301 06/11/15 0700 06/11/15 0740 06/11/15 1114  BP: 132/63 148/87 146/69 136/65  Pulse: 83 86 86 78  Temp: 98 F (36.7 C)  98.1 F (36.7 C) 98.2 F (36.8 C)  TempSrc: Oral  Oral Oral  Resp: 15 17 23 17   Height:      Weight:      SpO2: 90% 93% 95% 94%     Intake/Output Summary (Last 24 hours) at 06/11/15 1156 Last data filed at 06/11/15 1000  Gross per 24 hour  Intake    649 ml  Output    400 ml  Net    249 ml    LABS: Basic Metabolic Panel:  Recent Labs  06/10/15 1225 06/11/15 0730  NA 134* 132*  K 5.1 6.0*  CL 101 103  CO2 23 18*  GLUCOSE 253* 138*  BUN 30* 28*  CREATININE 2.25* 1.61*  CALCIUM 9.5 8.3*   Liver Function Tests:  Recent Labs  06/10/15 1225  AST 25  ALT 24  ALKPHOS 49  BILITOT 0.8  PROT 7.0  ALBUMIN 4.1    Recent Labs  06/10/15 1225  LIPASE 14*   CBC:  Recent Labs  06/10/15 1225 06/11/15 0730  WBC 19.4* 21.2*  NEUTROABS 17.3*  --   HGB 16.5 14.5  HCT 50.5 44.0  MCV 87.8 87.5  PLT 268 238   Cardiac Enzymes:  Recent Labs  06/10/15 1657 06/11/15 0247 06/11/15 0730  TROPONINI 0.95* 0.48* 0.32*   BNP: Invalid input(s): POCBNP D-Dimer: No results for input(s): DDIMER in the last 72 hours. Hemoglobin A1C: No results for input(s): HGBA1C in the last 72 hours. Fasting Lipid Panel:  Recent Labs  06/11/15 0247  CHOL 114  HDL 45  LDLCALC 54  TRIG 73  CHOLHDL 2.5   Thyroid Function Tests: No results for input(s): TSH, T4TOTAL, T3FREE, THYROIDAB in the last 72 hours.  Invalid input(s): FREET3  RADIOLOGY: Ct  Abdomen Pelvis Wo Contrast  06/10/2015   CLINICAL DATA:  Abdominal pain, constipation. Bright red blood in stool.  EXAM: CT ABDOMEN AND PELVIS WITHOUT CONTRAST  TECHNIQUE: Multidetector CT imaging of the abdomen and pelvis was performed following the standard protocol without IV contrast.  COMPARISON:  None.  FINDINGS: The transverse colon and descending colon are decompressed which limits characterization of their walls but I suspect mild thickening of the walls of the descending colon. There is also subtle inflammatory stranding within the paracolic fat adjacent to the descending colon and trace free fluid within the lower left paracolic gutter.  Scattered diverticulosis noted within the descending and sigmoid colon, most prominent in the sigmoid colon, but no focal inflammatory change to suggest acute diverticulitis. Large and small bowel are normal in caliber throughout.  Liver, spleen, pancreas, gallbladder, and adrenal glands are within normal limits for a noncontrast study. There are 2 nonobstructing stones in the left renal pelvis, largest measuring 6 mm. No associated hydronephrosis. 10 mm hypodense mass exophytic to the lateral cortex of the right kidney is too small to characterize but probably a small cyst. No right- sided renal stone or  hydronephrosis. No ureteral or bladder calculi.  No free fluid or abscess collection. No free intraperitoneal air. Scattered atherosclerotic changes are seen along the walls of the normal- caliber abdominal aorta.  There is mild dependent atelectasis within the lower lungs bilaterally. 7 mm pulmonary nodule is also identified within the right lower lobe on axial image 5.  IMPRESSION: 1. Probable mild thickening of the walls of the descending colon suggesting a mild colitis of infectious or inflammatory nature. Associated subtle inflammatory stranding within the paracolic fat and trace free fluid in the lower left paracolic gutter. 2. Colonic diverticulosis without  evidence of acute diverticulitis. 3. 7 mm pulmonary nodule within the right lower lobe, too small to definitively characterize. If the patient is at high risk for bronchogenic carcinoma, follow-up chest CT at 3-35months is recommended. If the patient is at low risk for bronchogenic carcinoma, follow-up chest CT at 6-12 months is recommended. This recommendation follows the consensus statement: Guidelines for Management of Small Pulmonary Nodules Detected on CT Scans: A Statement from the [place] as published in Radiology 2005; 237:395-400. 4. 1 cm mass exophytic to the lateral cortex of the right kidney. This is also too small to definitively characterize but most likely a benign cyst based on CT density measurements. Would consider follow-up renal ultrasound at some point to ensure benignity. 5. Left nephrolithiasis. 6. No bowel obstruction. No abscess collection. No free intraperitoneal air. These results were called by telephone at the time of interpretation on 06/10/2015 at 4:24 pm to Dr. Joseph Berkshire , who verbally acknowledged these results.   Electronically Signed   By: Franki Cabot M.D.   On: 06/10/2015 16:25   Dg Abd Acute W/chest  06/10/2015   CLINICAL DATA:  Sudden onset of severe abdominal pain at 0300 hours, abdominal distension, history hypertension, diverticulitis, former smoker  EXAM: DG ABDOMEN ACUTE W/ 1V CHEST  COMPARISON:  None  FINDINGS: Enlargement of cardiac silhouette.  Mediastinal contours and pulmonary vascularity normal.  Lungs clear.  No pleural effusion or pneumothorax.  Mild gaeous distention of stomach.  Question wall thickening of duodenum.  Nonobstructive bowel gas pattern otherwise seen.  No bowel dilatation or free air.  No urinary tract calcification or bone abnormality.  IMPRESSION: Enlargement of cardiac silhouette.  Mild gaseous distention of stomach with questionable wall thickening of the descending duodenum.  Otherwise normal bowel gas pattern.    Electronically Signed   By: Lavonia Dana M.D.   On: 06/10/2015 13:23    PHYSICAL EXAM  today the patient is mildly lethargic. He is not having any headache or chills. There are no sweats. He is not having any chest pain or cough. There are no urinary symptoms.   TELEMETRY: I have personally reviewed telemetry today June 11, 2015. There is normal sinus rhythm. There is a 6 beat run of wide complex tachycardia. The rate ranges from 140-110.   ASSESSMENT AND PLAN:    Colitis     Patient is admitted with severe colitis. He is receiving fluids and other medications. He is stabilizing. The patient was intravascularly volume depleted. He is receiving IV fluids. This will have to be carefully monitored. He does have some peripheral edema. I suspect that his albumen is low.    NSTEMI (non-ST elevated myocardial infarction)    Most probably his troponin elevation was related to hypoperfusion and acidosis related to his colitis. He has known coronary disease. He is being anticoagulated.     Acute on chronic kidney failure  Creatinine is improving today.    Lactic acidosis    Hyperkalemia     Potassium was higher today. Currently he is not receiving any exhaustion and his potassium. This will be rechecked.    CAD (coronary artery disease), native coronary artery    Patient has known coronary disease. His cardiac status had been stable before admission. With troponin elevation this admission, consideration will have to be given to the possibility of cardiac catheterization before he goes home.     Wide-complex tachycardia      Patient had 7 wide-complex beats today. The rates ranged from 110-130. Potassium is not low. Magnesium will be checked.  Dola Argyle 06/11/2015 11:56 AM

## 2015-06-11 NOTE — Progress Notes (Signed)
CRITICAL VALUE ALERT  Critical value received:  Troponin 0.52  Date of notification:  06/11/15  Time of notification:  3958  Critical value read back:Yes.    Nurse who received alert:  D. Aleene Davidson, RN  MD notified (1st page):  Dr. Sherral Hammers  Time of first page:  1505  MD notified (2nd page):  Time of second page:  Responding MD:  Dr. Sherral Hammers  Time MD responded:  [phone]

## 2015-06-12 ENCOUNTER — Inpatient Hospital Stay (HOSPITAL_COMMUNITY): Payer: Medicare Other

## 2015-06-12 DIAGNOSIS — I25110 Atherosclerotic heart disease of native coronary artery with unstable angina pectoris: Secondary | ICD-10-CM

## 2015-06-12 DIAGNOSIS — A419 Sepsis, unspecified organism: Principal | ICD-10-CM

## 2015-06-12 DIAGNOSIS — I509 Heart failure, unspecified: Secondary | ICD-10-CM

## 2015-06-12 DIAGNOSIS — IMO0001 Reserved for inherently not codable concepts without codable children: Secondary | ICD-10-CM | POA: Diagnosis present

## 2015-06-12 DIAGNOSIS — R109 Unspecified abdominal pain: Secondary | ICD-10-CM | POA: Diagnosis present

## 2015-06-12 LAB — CBC
HCT: 41.9 % (ref 39.0–52.0)
Hemoglobin: 13 g/dL (ref 13.0–17.0)
MCH: 27.9 pg (ref 26.0–34.0)
MCHC: 31 g/dL (ref 30.0–36.0)
MCV: 89.9 fL (ref 78.0–100.0)
PLATELETS: 229 10*3/uL (ref 150–400)
RBC: 4.66 MIL/uL (ref 4.22–5.81)
RDW: 15 % (ref 11.5–15.5)
WBC: 20.9 10*3/uL — AB (ref 4.0–10.5)

## 2015-06-12 LAB — LACTIC ACID, PLASMA: LACTIC ACID, VENOUS: 1.1 mmol/L (ref 0.5–2.0)

## 2015-06-12 LAB — TROPONIN I
TROPONIN I: 0.2 ng/mL — AB (ref <0.031)
Troponin I: 0.25 ng/mL — ABNORMAL HIGH (ref <0.031)
Troponin I: 0.22 ng/mL — ABNORMAL HIGH (ref <0.031)

## 2015-06-12 LAB — HEPARIN LEVEL (UNFRACTIONATED)
HEPARIN UNFRACTIONATED: 0.38 [IU]/mL (ref 0.30–0.70)
Heparin Unfractionated: 0.21 IU/mL — ABNORMAL LOW (ref 0.30–0.70)

## 2015-06-12 MED ORDER — IPRATROPIUM-ALBUTEROL 0.5-2.5 (3) MG/3ML IN SOLN: 3.0000 mL | Freq: Once | RESPIRATORY_TRACT | Status: DC

## 2015-06-12 MED ORDER — IPRATROPIUM-ALBUTEROL 0.5-2.5 (3) MG/3ML IN SOLN
RESPIRATORY_TRACT | Status: AC
  Administered 2015-06-12: 3 mL
  Filled 2015-06-12: qty 3

## 2015-06-12 NOTE — Progress Notes (Signed)
ANTICOAGULATION CONSULT NOTE - Follow Up Consult  Pharmacy Consult for Heparin Indication: chest pain/ACS  Allergies  Allergen Reactions  . Sulfa Antibiotics Itching    Patient Measurements: Height: 5\' 11"  (180.3 cm) Weight: (!) 308 lb 13.8 oz (140.1 kg) IBW/kg (Calculated) : 75.3 Heparin Dosing Weight: 108 kg  Vital Signs: Temp: 98.4 F (36.9 C) (07/24 1940) Temp Source: Oral (07/24 1940) BP: 129/54 mmHg (07/24 1937) Pulse Rate: 73 (07/24 1937)  Labs:  Recent Labs  06/10/15 1222  06/10/15 1225  06/11/15 0730 06/11/15 1200  06/11/15 1947 06/11/15 2150 06/12/15 0255 06/12/15 1310 06/12/15 1909  HGB  --   < > 16.5  --  14.5  --   --   --   --  13.0  --   --   HCT  --   --  50.5  --  44.0  --   --   --   --  41.9  --   --   PLT  --   --  268  --  238  --   --   --   --  229  --   --   LABPROT 14.2  --   --   --   --   --   --   --   --   --   --   --   INR 1.08  --   --   --   --   --   --   --   --   --   --   --   HEPARINUNFRC  --   --   --   < >  --   --   < > 0.22*  --  0.38  --  0.21*  CREATININE  --   --  2.25*  --  1.61* 1.63*  --   --   --   --   --   --   TROPONINI  --   --  0.37*  < > 0.32*  --   < >  --  0.30* 0.25* 0.20*  --   < > = values in this interval not displayed.  Estimated Creatinine Clearance: 64.7 mL/min (by C-G formula based on Cr of 1.63).   Medications:  Scheduled:  . antiseptic oral rinse  7 mL Mouth Rinse q12n4p  . aspirin EC  162 mg Oral Daily  . buPROPion  300 mg Oral QHS  . chlorhexidine  15 mL Mouth Rinse BID  . ciprofloxacin  400 mg Intravenous Q12H  . clonazePAM  0.5 mg Oral BID  . ipratropium-albuterol  3 mL Nebulization Once  . latanoprost  1 drop Both Eyes QHS  . metoprolol tartrate  12.5 mg Oral BID  . metroNIDAZOLE  500 mg Intravenous 4 times per day  . nitroGLYCERIN  1 inch Topical 4 times per day  . omega-3 acid ethyl esters  1 g Oral Daily  . rosuvastatin  20 mg Oral QPM  . sucralfate  1 g Oral TID WC & HS     Assessment: 64yo male admitted with infectious colitis with bloody diarrhea and chest pain. He is on heparin and at goal (HL= ) after bolus and infusion increase.   F/u HL is subtherapeutic at 0.21 on heparin 2100 units/hr. Nurse reports no issues with the infusion.  Goal of Therapy:  Heparin level 0.3-0.7 units/ml Monitor platelets by anticoagulation protocol: Yes   Plan:  Increase heparin to 2400 units/hr Will confirm  a heparin level later today Daily heparin level and CBC  Sergio Griffin. Diona Foley, PharmD Clinical Pharmacist Pager [phone] 06/12/2015 8:19 PM

## 2015-06-12 NOTE — Progress Notes (Signed)
SUBJECTIVE:  No recurring chest pain. He is lying flat in bed with no shortness of breath. He is tolerating IV fluids. No recurrent chest pain.   Filed Vitals:   06/12/15 0000 06/12/15 0453 06/12/15 0700 06/12/15 0833  BP: 161/67 170/64 128/57   Pulse: 75 84    Temp: 98.1 F (36.7 C) 98.2 F (36.8 C)  98.3 F (36.8 C)  TempSrc: Oral Oral  Oral  Resp: 17 22    Height:      Weight:      SpO2: 92% 94%       Intake/Output Summary (Last 24 hours) at 06/12/15 1005 Last data filed at 06/12/15 0700  Gross per 24 hour  Intake 3188.76 ml  Output    350 ml  Net 2838.76 ml    LABS: Basic Metabolic Panel:  Recent Labs  06/11/15 0730 06/11/15 1200  NA 132* 132*  K 6.0* 5.9*  CL 103 103  CO2 18* 16*  GLUCOSE 138* 135*  BUN 28* 31*  CREATININE 1.61* 1.63*  CALCIUM 8.3* 8.2*  MG  --  2.6*   Liver Function Tests:  Recent Labs  06/10/15 1225 06/11/15 1200  AST 25 32  ALT 24 21  ALKPHOS 49 59  BILITOT 0.8 1.4*  PROT 7.0 6.2*  ALBUMIN 4.1 3.4*    Recent Labs  06/10/15 1225  LIPASE 14*   CBC:  Recent Labs  06/10/15 1225 06/11/15 0730 06/12/15 0255  WBC 19.4* 21.2* 20.9*  NEUTROABS 17.3*  --   --   HGB 16.5 14.5 13.0  HCT 50.5 44.0 41.9  MCV 87.8 87.5 89.9  PLT 268 238 229   Cardiac Enzymes:  Recent Labs  06/11/15 1338 06/11/15 2150 06/12/15 0255  TROPONINI 0.52* 0.30* 0.25*   BNP: Invalid input(s): POCBNP D-Dimer: No results for input(s): DDIMER in the last 72 hours. Hemoglobin A1C: No results for input(s): HGBA1C in the last 72 hours. Fasting Lipid Panel:  Recent Labs  06/11/15 0247  CHOL 114  HDL 45  LDLCALC 54  TRIG 73  CHOLHDL 2.5   Thyroid Function Tests: No results for input(s): TSH, T4TOTAL, T3FREE, THYROIDAB in the last 72 hours.  Invalid input(s): FREET3  RADIOLOGY: Ct Abdomen Pelvis Wo Contrast  06/10/2015   CLINICAL DATA:  Abdominal pain, constipation. Bright red blood in stool.  EXAM: CT ABDOMEN AND PELVIS  WITHOUT CONTRAST  TECHNIQUE: Multidetector CT imaging of the abdomen and pelvis was performed following the standard protocol without IV contrast.  COMPARISON:  None.  FINDINGS: The transverse colon and descending colon are decompressed which limits characterization of their walls but I suspect mild thickening of the walls of the descending colon. There is also subtle inflammatory stranding within the paracolic fat adjacent to the descending colon and trace free fluid within the lower left paracolic gutter.  Scattered diverticulosis noted within the descending and sigmoid colon, most prominent in the sigmoid colon, but no focal inflammatory change to suggest acute diverticulitis. Large and small bowel are normal in caliber throughout.  Liver, spleen, pancreas, gallbladder, and adrenal glands are within normal limits for a noncontrast study. There are 2 nonobstructing stones in the left renal pelvis, largest measuring 6 mm. No associated hydronephrosis. 10 mm hypodense mass exophytic to the lateral cortex of the right kidney is too small to characterize but probably a small cyst. No right- sided renal stone or hydronephrosis. No ureteral or bladder calculi.  No free fluid or abscess collection. No free intraperitoneal air. Scattered  atherosclerotic changes are seen along the walls of the normal- caliber abdominal aorta.  There is mild dependent atelectasis within the lower lungs bilaterally. 7 mm pulmonary nodule is also identified within the right lower lobe on axial image 5.  IMPRESSION: 1. Probable mild thickening of the walls of the descending colon suggesting a mild colitis of infectious or inflammatory nature. Associated subtle inflammatory stranding within the paracolic fat and trace free fluid in the lower left paracolic gutter. 2. Colonic diverticulosis without evidence of acute diverticulitis. 3. 7 mm pulmonary nodule within the right lower lobe, too small to definitively characterize. If the patient is at  high risk for bronchogenic carcinoma, follow-up chest CT at 3-77months is recommended. If the patient is at low risk for bronchogenic carcinoma, follow-up chest CT at 6-12 months is recommended. This recommendation follows the consensus statement: Guidelines for Management of Small Pulmonary Nodules Detected on CT Scans: A Statement from the [place] as published in Radiology 2005; 237:395-400. 4. 1 cm mass exophytic to the lateral cortex of the right kidney. This is also too small to definitively characterize but most likely a benign cyst based on CT density measurements. Would consider follow-up renal ultrasound at some point to ensure benignity. 5. Left nephrolithiasis. 6. No bowel obstruction. No abscess collection. No free intraperitoneal air. These results were called by telephone at the time of interpretation on 06/10/2015 at 4:24 pm to Dr. Joseph Berkshire , who verbally acknowledged these results.   Electronically Signed   By: Franki Cabot M.D.   On: 06/10/2015 16:25   Dg Abd Acute W/chest  06/10/2015   CLINICAL DATA:  Sudden onset of severe abdominal pain at 0300 hours, abdominal distension, history hypertension, diverticulitis, former smoker  EXAM: DG ABDOMEN ACUTE W/ 1V CHEST  COMPARISON:  None  FINDINGS: Enlargement of cardiac silhouette.  Mediastinal contours and pulmonary vascularity normal.  Lungs clear.  No pleural effusion or pneumothorax.  Mild gaeous distention of stomach.  Question wall thickening of duodenum.  Nonobstructive bowel gas pattern otherwise seen.  No bowel dilatation or free air.  No urinary tract calcification or bone abnormality.  IMPRESSION: Enlargement of cardiac silhouette.  Mild gaseous distention of stomach with questionable wall thickening of the descending duodenum.  Otherwise normal bowel gas pattern.   Electronically Signed   By: Lavonia Dana M.D.   On: 06/10/2015 13:23    PHYSICAL EXAM   he is comfortable in bed at this time. He is lethargic at this  time. Lungs reveal scattered rhonchi. Cardiac exam reveals S1 and S2.   TELEMETRY:   I reviewed telemetry today. No further ventricular ectopy is seen.   ASSESSMENT AND PLAN:    Colitis    NSTEMI (non-ST elevated myocardial infarction)     Mild enzyme elevation continues to improve. Continue to treat his colitis. Consider further evaluation as he stabilizes in the hospital.    Acute on chronic kidney failure     Creatinine is the same today. He is tolerating IV fluids. I would suggest careful attention to this to be sure that he doesn't become volume overloaded.    Wide-complex tachycardia     He had 7 beats of wide complex tachycardia yesterday. The rate was not particularly fast. I have not seen any further wide  complex ectopy on telemetry review    UTI (lower urinary tract infection)    Solitary pulmonary nodule   Dola Argyle 06/12/2015 10:05 AM

## 2015-06-12 NOTE — Progress Notes (Signed)
  Echocardiogram 2D Echocardiogram has been performed.  Jennette Dubin 06/12/2015, 1:48 PM

## 2015-06-12 NOTE — Progress Notes (Signed)
ANTICOAGULATION CONSULT NOTE - Follow Up Consult  Pharmacy Consult for Heparin Indication: chest pain/ACS  Allergies  Allergen Reactions  . Sulfa Antibiotics Itching    Patient Measurements: Height: 5\' 11"  (180.3 cm) Weight: (!) 308 lb 13.8 oz (140.1 kg) IBW/kg (Calculated) : 75.3 Heparin Dosing Weight:   Vital Signs: Temp: 98.3 F (36.8 C) (07/24 0833) Temp Source: Oral (07/24 0833) BP: 128/57 mmHg (07/24 0700) Pulse Rate: 84 (07/24 0453)  Labs:  Recent Labs  06/10/15 1222  06/10/15 1225  06/11/15 0730 06/11/15 1200 06/11/15 1338 06/11/15 1410 06/11/15 1947 06/11/15 2150 06/12/15 0255  HGB  --   < > 16.5  --  14.5  --   --   --   --   --  13.0  HCT  --   --  50.5  --  44.0  --   --   --   --   --  41.9  PLT  --   --  268  --  238  --   --   --   --   --  229  LABPROT 14.2  --   --   --   --   --   --   --   --   --   --   INR 1.08  --   --   --   --   --   --   --   --   --   --   HEPARINUNFRC  --   --   --   < >  --   --   --  0.35 0.22*  --  0.38  CREATININE  --   --  2.25*  --  1.61* 1.63*  --   --   --   --   --   TROPONINI  --   --  0.37*  < > 0.32*  --  0.52*  --   --  0.30* 0.25*  < > = values in this interval not displayed.  Estimated Creatinine Clearance: 64.7 mL/min (by C-G formula based on Cr of 1.63).   Medications:  Scheduled:  . antiseptic oral rinse  7 mL Mouth Rinse q12n4p  . aspirin EC  162 mg Oral Daily  . buPROPion  300 mg Oral QHS  . chlorhexidine  15 mL Mouth Rinse BID  . ciprofloxacin  400 mg Intravenous Q12H  . clonazePAM  0.5 mg Oral BID  . latanoprost  1 drop Both Eyes QHS  . metoprolol tartrate  12.5 mg Oral BID  . metroNIDAZOLE  500 mg Intravenous 4 times per day  . nitroGLYCERIN  1 inch Topical 4 times per day  . omega-3 acid ethyl esters  1 g Oral Daily  . rosuvastatin  20 mg Oral QPM  . sucralfate  1 g Oral TID WC & HS    Assessment: 64yo male admitted with infectious colitis with bloody diarrhea and chest pain. He is  on heparin and at goal (HL= ) after bolus and infusion increase.   Goal of Therapy:  Heparin level 0.3-0.7 units/ml Monitor platelets by anticoagulation protocol: Yes   Plan:  -No heparin changes needed -Will confirm a heparin level later today -Daily heparin level and CBC  Hildred Laser, Pharm D 06/12/2015 10:25 AM

## 2015-06-12 NOTE — Progress Notes (Addendum)
[facility] TEAM 1 - Stepdown/ICU TEAM Progress Note  Sergio Griffin DTO:[id number] DOB: Mar 24, 1951 DOA: 06/10/2015 PCP: No primary care provider on file.  Admit HPI / Brief Narrative: 64 y.o. WM PMHx HTN, HLD, CAD withcoronary angiogram with stenting approximately 5-6 years ago, GERD.   Cardiologist up in [place] that he follows with yearly. He presents to the hospital with increasing abdominal pain, bloody diarrhea that started at approximately 1 this morning. He has had multiple episodes of bloody diarrhea that has progressed throughout the morning, accompanied by feeling extremely ill with left-sided, substernal chest pain, diaphoresis, cold sweats, and retching. At approximately 11 AM, the patient decided to present to the emergency room for evaluation. He has had no bloody diarrhea since that time.  With his chest pain, the patient has had left-sided, substernal chest pain doubt radiation, associated with diaphoresis, cold sweats, nausea. He had the chest pain for approximately 6-7 hours, but this has gradually trailed off since being at the hospital. He currently reports no chest pain.    HPI/Subjective: 7/24 A/O 4, continued mild abdominal pain, negative CP, negative SOB, positive DOE. Positive BRBPR and melanic stool. Patient son confided in RN that just prior to admission he performed an enema on patient. Patient continues to have waxing and waning periods of extreme abdominal pain. Per patient last colonoscopy 5 years ago findings of diverticulitis.  Assessment/Plan: NSTEMI - Admitting physician discussed this patient with Dr. Tommi Rumps of (cardiology) recommended starting anticoagulation. -Continue heparin drip; has bloody infectious colitis. May have to DC heparin if continues.  -Continue Nitropaste; currently CP free -Continue to trend troponin; trending down. -Echocardiogram; see results below -Lipid panel within ADA guidelines; however started on Crestor 20 mg daily per  cardiology  HTN -DC Toprol XL 25 mg daily -Start metoprolol 12.5 mg BID -Continue Nitropaste  Pulmonary nodule (previous smoker) -6 mm pulmonary nodule in RLL -noncontrast chest CT; no further nodules and remainder of lung fields -Currently the patient is without chest pain -Patient previous smoker require follow-up chest CT in 6-12 months  Infectious Colitis/Sepsis unspecified organism -Continue Cipro and Flagyl -Stool culture NGTD -Decrease normal saline 96ml/hr -Maintain NPO -Secondary to continued BRBPR, and son administering enema prior to admission will consult GI in a.m.  UTI -Urine culture pending -Typical organisms would be covered by current antibiotics  Acute on Chronic kidney failure (no previous Cr for baseline) -Continue hydration -Cr trending down -Hold all nephrotoxic medication  lactic acidosis -Trend lactic acid -Continue hydrate   Hyperkalemia -Obtain BMP   Code Status: FULL Family Communication: family present at time of exam Disposition Plan: Per cardiology    Consultants: Dr.Daniel Tommi Rumps (cardiology)    Procedure/Significant Events: 7/22 CT abdomen pelvis without contrast;-Probable mild thickening walls of descending colon mild colitis   infectious vs inflammatory nature. -Associated subtle inflammatory stranding W/I paracolic fat and trace free fluid in the lower left paracolic gutter. -7 mm pulmonary nodule within the RLL  -1 cm mass exophytic to the lateral cortex of the right kidney; most likely benign cyst -Left nephrolithiasis. 7/24 Echocardiogram; - Left ventricle: moderateLVH.-LVEF= 60%.  7/24 CT chest without contrast;-pleural effusions with associated compressive atelectasis. -6 mm RLL pulmonary nodule.   Culture 7/22 urine pending 7/23 blood left/right hand NGTD 7/23 MRSA by PCR negative 7/23 stool NGTD  Antibiotics: Ciprofloxacin 7/22>> Flagyl 7/22>>  DVT prophylaxis: Heparin drip   Devices    LINES /  TUBES:      Continuous Infusions: . sodium chloride 125 mL/hr at  06/12/15 1637  . heparin 2,100 Units/hr (06/12/15 1900)    Objective: VITAL SIGNS: Temp: 98.4 F (36.9 C) (07/24 1940) Temp Source: Oral (07/24 1940) BP: 129/54 mmHg (07/24 1937) Pulse Rate: 73 (07/24 1937) SPO2; FIO2:   Intake/Output Summary (Last 24 hours) at 06/12/15 2040 Last data filed at 06/12/15 1900  Gross per 24 hour  Intake 3331.42 ml  Output    100 ml  Net 3231.42 ml     Exam: General: A/O 4, NAD, No acute respiratory distress Eyes: Negative headache, eye pain, double vision, negative scleral hemorrhage ENT: Negative Runny nose, negative ear pain, negative tinnitus, negative gingival bleeding Neck:  Negative scars, masses, torticollis, lymphadenopathy, JVD Lungs: Clear to auscultation bilaterally without wheezes or crackles Cardiovascular: Regular rate and rhythm without murmur gallop or rub normal S1 and S2 Abdomen:  Morbidly obese positive abdominal pain worse in LUQ/LLQ (improved from 7/23), negative dysphagia, soft, bowel sounds positive, no rebound, no ascites, no appreciable mass Extremities: No significant cyanosis, clubbing, or edema bilateral lower extremities Psychiatric:  Negative depression, negative anxiety, negative fatigue, negative mania  Neurologic:  Cranial nerves II through XII intact, tongue/uvula midline, all extremities muscle strength 5/5, sensation intact throughout,  negative dysarthria, negative expressive aphasia, negative receptive aphasia.     Data Reviewed: Basic Metabolic Panel:  Recent Labs Lab 06/10/15 1225 06/11/15 0730 06/11/15 1200  NA 134* 132* 132*  K 5.1 6.0* 5.9*  CL 101 103 103  CO2 23 18* 16*  GLUCOSE 253* 138* 135*  BUN 30* 28* 31*  CREATININE 2.25* 1.61* 1.63*  CALCIUM 9.5 8.3* 8.2*  MG  --   --  2.6*   Liver Function Tests:  Recent Labs Lab 06/10/15 1225 06/11/15 1200  AST 25 32  ALT 24 21  ALKPHOS 49 59  BILITOT 0.8 1.4*    PROT 7.0 6.2*  ALBUMIN 4.1 3.4*    Recent Labs Lab 06/10/15 1225  LIPASE 14*   No results for input(s): AMMONIA in the last 168 hours. CBC:  Recent Labs Lab 06/10/15 1225 06/11/15 0730 06/12/15 0255  WBC 19.4* 21.2* 20.9*  NEUTROABS 17.3*  --   --   HGB 16.5 14.5 13.0  HCT 50.5 44.0 41.9  MCV 87.8 87.5 89.9  PLT 268 238 229   Cardiac Enzymes:  Recent Labs Lab 06/11/15 1338 06/11/15 2150 06/12/15 0255 06/12/15 1310 06/12/15 1909  TROPONINI 0.52* 0.30* 0.25* 0.20* 0.22*   BNP (last 3 results)  Recent Labs  06/10/15 1225  BNP 16.0    ProBNP (last 3 results) No results for input(s): PROBNP in the last 8760 hours.  CBG: No results for input(s): GLUCAP in the last 168 hours.  Recent Results (from the past 240 hour(s))  MRSA PCR Screening     Status: None   Collection Time: 06/11/15  1:55 AM  Result Value Ref Range Status   MRSA by PCR NEGATIVE NEGATIVE Final    Comment:        The GeneXpert MRSA Assay (FDA approved for NASAL specimens only), is one component of a comprehensive MRSA colonization surveillance program. It is not intended to diagnose MRSA infection nor to guide or monitor treatment for MRSA infections.   Culture, blood (routine x 2)     Status: None (Preliminary result)   Collection Time: 06/11/15  9:50 PM  Result Value Ref Range Status   Specimen Description BLOOD LEFT HAND  Final   Special Requests IN PEDIATRIC BOTTLE 2CC  Final   Culture NO GROWTH <  24 HOURS  Final   Report Status PENDING  Incomplete  Culture, blood (routine x 2)     Status: None (Preliminary result)   Collection Time: 06/11/15  9:56 PM  Result Value Ref Range Status   Specimen Description BLOOD RIGHT HAND  Final   Special Requests IN PEDIATRIC BOTTLE 2CC  Final   Culture NO GROWTH < 24 HOURS  Final   Report Status PENDING  Incomplete     Studies:  Recent x-ray studies have been reviewed in detail by the Attending Physician  Scheduled Meds:  Scheduled  Meds: . antiseptic oral rinse  7 mL Mouth Rinse q12n4p  . aspirin EC  162 mg Oral Daily  . buPROPion  300 mg Oral QHS  . chlorhexidine  15 mL Mouth Rinse BID  . ciprofloxacin  400 mg Intravenous Q12H  . clonazePAM  0.5 mg Oral BID  . ipratropium-albuterol  3 mL Nebulization Once  . latanoprost  1 drop Both Eyes QHS  . metoprolol tartrate  12.5 mg Oral BID  . metroNIDAZOLE  500 mg Intravenous 4 times per day  . nitroGLYCERIN  1 inch Topical 4 times per day  . omega-3 acid ethyl esters  1 g Oral Daily  . rosuvastatin  20 mg Oral QPM  . sucralfate  1 g Oral TID WC & HS    Time spent on care of this patient: 40 mins   Kitai Purdom, Geraldo Docker , MD  [facility] Office  [phone] Pager - [phone]  On-Call/Text Page:      [email or web address]      password [email or web address]  If 7PM-7AM, please contact night-coverage [email or web address] Password [email or web address] 06/12/2015, 8:40 PM   LOS: 2 days   Care during the described time interval was provided by me .  I have reviewed this patient's available data, including medical history, events of note, physical examination, and all test results as part of my evaluation. I have personally reviewed and interpreted all radiology studies.   Dia Crawford, MD [phone] Pager

## 2015-06-13 DIAGNOSIS — R911 Solitary pulmonary nodule: Secondary | ICD-10-CM | POA: Diagnosis present

## 2015-06-13 LAB — HEPARIN LEVEL (UNFRACTIONATED)
Heparin Unfractionated: 0.21 [IU]/mL — ABNORMAL LOW (ref 0.30–0.70)
Heparin Unfractionated: 0.32 [IU]/mL (ref 0.30–0.70)
Heparin Unfractionated: 0.52 [IU]/mL (ref 0.30–0.70)

## 2015-06-13 LAB — CBC
HEMATOCRIT: 39.4 % (ref 39.0–52.0)
HEMOGLOBIN: 12 g/dL — AB (ref 13.0–17.0)
MCH: 27.8 pg (ref 26.0–34.0)
MCHC: 30.5 g/dL (ref 30.0–36.0)
MCV: 91.4 fL (ref 78.0–100.0)
Platelets: 217 10*3/uL (ref 150–400)
RBC: 4.31 MIL/uL (ref 4.22–5.81)
RDW: 14.9 % (ref 11.5–15.5)
WBC: 19.5 10*3/uL — AB (ref 4.0–10.5)

## 2015-06-13 LAB — BASIC METABOLIC PANEL WITH GFR
Anion gap: 6 (ref 5–15)
BUN: 11 mg/dL (ref 6–20)
CO2: 28 mmol/L (ref 22–32)
Calcium: 7.9 mg/dL — ABNORMAL LOW (ref 8.9–10.3)
Chloride: 103 mmol/L (ref 101–111)
Creatinine, Ser: 1.26 mg/dL — ABNORMAL HIGH (ref 0.61–1.24)
GFR calc Af Amer: >60 mL/min
GFR calc non Af Amer: 58 mL/min — ABNORMAL LOW
Glucose, Bld: 110 mg/dL — ABNORMAL HIGH (ref 65–99)
Potassium: 4.6 mmol/L (ref 3.5–5.1)
Sodium: 137 mmol/L (ref 135–145)

## 2015-06-13 LAB — MAGNESIUM: Magnesium: 2.2 mg/dL (ref 1.7–2.4)

## 2015-06-13 LAB — LACTIC ACID, PLASMA: Lactic Acid, Venous: 0.9 mmol/L (ref 0.5–2.0)

## 2015-06-13 LAB — TROPONIN I
TROPONIN I: 0.16 ng/mL — AB (ref <0.031)
Troponin I: 0.2 ng/mL — ABNORMAL HIGH
Troponin I: 0.1 ng/mL — ABNORMAL HIGH (ref <0.031)

## 2015-06-13 LAB — HEMOGLOBIN A1C
HEMOGLOBIN A1C: 6.3 % — AB (ref 4.8–5.6)
MEAN PLASMA GLUCOSE: 134 mg/dL

## 2015-06-13 MED ORDER — OXYCODONE HCL 5 MG PO TABS
5.0000 mg | ORAL_TABLET | ORAL | Status: DC | PRN
Start: 2015-06-13 — End: 2015-06-22
  Administered 2015-06-13 – 2015-06-17 (×7): 5 mg via ORAL
  Filled 2015-06-13 (×7): qty 1

## 2015-06-13 NOTE — Progress Notes (Addendum)
ANTICOAGULATION CONSULT NOTE - Follow Up Consult  Pharmacy Consult for Heparin Indication: Chest pain / ACS  Allergies  Allergen Reactions  . Sulfa Antibiotics Itching    Patient Measurements: Height: 5\' 11"  (180.3 cm) Weight: (!) 308 lb 13.8 oz (140.1 kg) IBW/kg (Calculated) : 75.3  Vital Signs: Temp: 98.8 F (37.1 C) (07/25 1237) Temp Source: Oral (07/25 1237) BP: 113/52 mmHg (07/25 1237) Pulse Rate: 69 (07/25 1237)  Labs:  Recent Labs  06/11/15 0730 06/11/15 1200  06/12/15 0255  06/12/15 1909 06/13/15 0240 06/13/15 1140  HGB 14.5  --   --  13.0  --   --  12.0*  --   HCT 44.0  --   --  41.9  --   --  39.4  --   PLT 238  --   --  229  --   --  217  --   HEPARINUNFRC  --   --   < > 0.38  --  0.21* 0.21* 0.32  CREATININE 1.61* 1.63*  --   --   --   --  1.26*  --   TROPONINI 0.32*  --   < > 0.25*  < > 0.22* 0.20* 0.16*  < > = values in this interval not displayed.  Estimated Creatinine Clearance: 83.7 mL/min (by C-G formula based on Cr of 1.26).  Assessment: 64yo male admitted with infectious colitis with bloody diarrhea and chest pain. Cards consulted and found pt to have NSTEMI. Will make final decisions on workup before he goes home. HL now borderline therapeutic at 0.32 with heparin gtt at 2700 units/hr.Hgb trending down to 12, plts wnl. No issues or s/s of bleed.  Goal of Therapy:  Heparin level 0.3-0.7 units/ml Monitor platelets by anticoagulation protocol: Yes   Plan:  Increase heparin gtt slightly to 2900 units/hr Confirm HL at 2000 Monitor daily HL, CBC, s/s of bleed  BATCHELDER,NATHAN J 06/13/2015,12:45 PM  ADDN: F/u HL is now therapeutic at 0.52 on heparin 2900 units/hr. Continue current rate and recheck in the morning.  Andrey Cota. Diona Foley, PharmD Clinical Pharmacist Pager [phone]

## 2015-06-13 NOTE — Progress Notes (Signed)
[facility] TEAM 1 - Stepdown/ICU TEAM Progress Note  Sergio Griffin UGQ:[id number] DOB: 11/11/1951 DOA: 06/10/2015 PCP: No primary care provider on file.  Admit HPI / Brief Narrative: 63 y.o. WM PMHx HTN, HLD, CAD with coronary angiogram with stenting approximately 5-6 years ago, GERD, S/P colonoscopy~2011 showing diverticulitis (reported by patient).   Cardiologist up in [place] that he follows with yearly. He presents to the hospital with increasing abdominal pain, bloody diarrhea that started at approximately 1 this morning. He has had multiple episodes of bloody diarrhea that has progressed throughout the morning, accompanied by feeling extremely ill with left-sided, substernal chest pain, diaphoresis, cold sweats, and retching. At approximately 11 AM, the patient decided to present to the emergency room for evaluation. He has had no bloody diarrhea since that time.  With his chest pain, the patient has had left-sided, substernal chest pain doubt radiation, associated with diaphoresis, cold sweats, nausea. He had the chest pain for approximately 6-7 hours, but this has gradually trailed off since being at the hospital. He currently reports no chest pain.    HPI/Subjective: 7/25 sleepy but arousable. A/O 4, children state that patient had significant abdominal pain overnight and therefore did not sleep well. Negative CP, negative SOB, positive DOE. Negative BRBPR or melanic stool.    Assessment/Plan: NSTEMI - Admitting physician discussed this patient with Dr. Tommi Rumps of (cardiology) recommended starting anticoagulation. -Continue heparin drip; has bloody infectious colitis. May have to DC heparin if continues.  -Continue Nitropaste; currently CP free -Continue to trend troponin; trending down. -Echocardiogram; see results below -Lipid panel within ADA guidelines; however started on Crestor 20 mg daily per cardiology (cardioprotective)  HTN -Continue metoprolol 12.5 mg  BID -Continue Nitropaste  Pulmonary nodule (previous smoker) -6 mm pulmonary nodule in RLL -noncontrast chest CT; no further nodules in remainder of lung fields -Patient previous smoker require follow-up chest CT in 6-12 months  Infectious Colitis/Sepsis unspecified organism -Continue Cipro and Flagyl -Stool culture NGTD -Decrease normal saline 50 ml/hr -Maintain NPO -No BRBPR overnight, hemoglobin stable. If bleeding resumes will consult GI in a.m.  UTI -Urine culture pending -Typical organisms would be covered by current antibiotics  Acute on Chronic kidney failure (no previous Cr for baseline) -Continue hydration -Cr trending down -Hold all nephrotoxic medication  lactic acidosis -Normalized  -Continue hydrate   Hyperkalemia -At goal    Code Status: FULL Family Communication: family present at time of exam Disposition Plan: Per cardiology    Consultants: Dr.Daniel Tommi Rumps (cardiology)    Procedure/Significant Events: 7/22 CT abdomen pelvis without contrast;-Probable mild thickening walls of descending colon mild colitis   infectious vs inflammatory nature. -Associated subtle inflammatory stranding W/I paracolic fat and trace free fluid in the lower left paracolic gutter. -7 mm pulmonary nodule within the RLL  -1 cm mass exophytic to the lateral cortex of the right kidney; most likely benign cyst -Left nephrolithiasis. 7/24 Echocardiogram; - Left ventricle: moderateLVH.-LVEF= 60%.  7/24 CT chest without contrast;-pleural effusions with associated compressive atelectasis. -6 mm RLL pulmonary nodule.   Culture 7/22 urine pending 7/23 blood left/right hand NGTD 7/23 MRSA by PCR negative 7/23 stool NGTD  Antibiotics: Ciprofloxacin 7/22>> Flagyl 7/22>>  DVT prophylaxis: Heparin drip   Devices    LINES / TUBES:      Continuous Infusions: . sodium chloride 75 mL/hr at 06/13/15 1031  . heparin 2,900 Units (06/13/15 1500)    Objective: VITAL  SIGNS: Temp: 98.8 F (37.1 C) (07/25 1600) Temp Source: Oral (07/25 1600) BP:  125/61 mmHg (07/25 1600) Pulse Rate: 80 (07/25 1600) SPO2; FIO2:   Intake/Output Summary (Last 24 hours) at 06/13/15 1800 Last data filed at 06/13/15 1800  Gross per 24 hour  Intake 1867.55 ml  Output    300 ml  Net 1567.55 ml     Exam: General: A/O 4, NAD, No acute respiratory distress Eyes: Negative headache, eye pain, double vision, negative scleral hemorrhage ENT: Negative Runny nose, negative ear pain, negative tinnitus, negative gingival bleeding Neck:  Negative scars, masses, torticollis, lymphadenopathy, JVD Lungs: Clear to auscultation bilaterally without wheezes or crackles Cardiovascular: Regular rate and rhythm without murmur gallop or rub normal S1 and S2 Abdomen:  Morbidly obese, positive abdominal pain worse in LUQ/LLQ (improved from 7/24), negative dysphagia, soft, bowel sounds positive, no rebound, no ascites, no appreciable mass Extremities: No significant cyanosis, clubbing, or edema bilateral lower extremities Psychiatric:  Negative depression, negative anxiety, negative fatigue, negative mania  Neurologic:  Cranial nerves II through XII intact, tongue/uvula midline, all extremities muscle strength 5/5, sensation intact throughout,  negative dysarthria, negative expressive aphasia, negative receptive aphasia.     Data Reviewed: Basic Metabolic Panel:  Recent Labs Lab 06/10/15 1225 06/11/15 0730 06/11/15 1200 06/13/15 0240  NA 134* 132* 132* 137  K 5.1 6.0* 5.9* 4.6  CL 101 103 103 103  CO2 23 18* 16* 28  GLUCOSE 253* 138* 135* 110*  BUN 30* 28* 31* 11  CREATININE 2.25* 1.61* 1.63* 1.26*  CALCIUM 9.5 8.3* 8.2* 7.9*  MG  --   --  2.6* 2.2   Liver Function Tests:  Recent Labs Lab 06/10/15 1225 06/11/15 1200  AST 25 32  ALT 24 21  ALKPHOS 49 59  BILITOT 0.8 1.4*  PROT 7.0 6.2*  ALBUMIN 4.1 3.4*    Recent Labs Lab 06/10/15 1225  LIPASE 14*   No results  for input(s): AMMONIA in the last 168 hours. CBC:  Recent Labs Lab 06/10/15 1225 06/11/15 0730 06/12/15 0255 06/13/15 0240  WBC 19.4* 21.2* 20.9* 19.5*  NEUTROABS 17.3*  --   --   --   HGB 16.5 14.5 13.0 12.0*  HCT 50.5 44.0 41.9 39.4  MCV 87.8 87.5 89.9 91.4  PLT 268 238 229 217   Cardiac Enzymes:  Recent Labs Lab 06/12/15 0255 06/12/15 1310 06/12/15 1909 06/13/15 0240 06/13/15 1140  TROPONINI 0.25* 0.20* 0.22* 0.20* 0.16*   BNP (last 3 results)  Recent Labs  06/10/15 1225  BNP 16.0    ProBNP (last 3 results) No results for input(s): PROBNP in the last 8760 hours.  CBG: No results for input(s): GLUCAP in the last 168 hours.  Recent Results (from the past 240 hour(s))  MRSA PCR Screening     Status: None   Collection Time: 06/11/15  1:55 AM  Result Value Ref Range Status   MRSA by PCR NEGATIVE NEGATIVE Final    Comment:        The GeneXpert MRSA Assay (FDA approved for NASAL specimens only), is one component of a comprehensive MRSA colonization surveillance program. It is not intended to diagnose MRSA infection nor to guide or monitor treatment for MRSA infections.   Culture, blood (routine x 2)     Status: None (Preliminary result)   Collection Time: 06/11/15  9:50 PM  Result Value Ref Range Status   Specimen Description BLOOD LEFT HAND  Final   Special Requests IN PEDIATRIC BOTTLE 2CC  Final   Culture NO GROWTH 2 DAYS  Final   Report Status  PENDING  Incomplete  Culture, blood (routine x 2)     Status: None (Preliminary result)   Collection Time: 06/11/15  9:56 PM  Result Value Ref Range Status   Specimen Description BLOOD RIGHT HAND  Final   Special Requests IN PEDIATRIC BOTTLE 2CC  Final   Culture NO GROWTH 2 DAYS  Final   Report Status PENDING  Incomplete     Studies:  Recent x-ray studies have been reviewed in detail by the Attending Physician  Scheduled Meds:  Scheduled Meds: . antiseptic oral rinse  7 mL Mouth Rinse q12n4p  .  aspirin EC  162 mg Oral Daily  . buPROPion  300 mg Oral QHS  . chlorhexidine  15 mL Mouth Rinse BID  . ciprofloxacin  400 mg Intravenous Q12H  . clonazePAM  0.5 mg Oral BID  . ipratropium-albuterol  3 mL Nebulization Once  . latanoprost  1 drop Both Eyes QHS  . metoprolol tartrate  12.5 mg Oral BID  . metroNIDAZOLE  500 mg Intravenous 4 times per day  . nitroGLYCERIN  1 inch Topical 4 times per day  . omega-3 acid ethyl esters  1 g Oral Daily  . rosuvastatin  20 mg Oral QPM  . sucralfate  1 g Oral TID WC & HS    Time spent on care of this patient: 40 mins   Modean Mccullum, Geraldo Docker , MD  [facility] Office  [phone] Pager - [phone]  On-Call/Text Page:      [email or web address]      password [email or web address]  If 7PM-7AM, please contact night-coverage [email or web address] Password [email or web address] 06/13/2015, 6:00 PM   LOS: 3 days   Care during the described time interval was provided by me .  I have reviewed this patient's available data, including medical history, events of note, physical examination, and all test results as part of my evaluation. I have personally reviewed and interpreted all radiology studies.   Dia Crawford, MD [phone] Pager

## 2015-06-13 NOTE — Clinical Documentation Improvement (Signed)
  Chronic Kidney disease is documented in the medical record.  Please specify the disease stage if possible. GFR= 29, 43, 58  CKD Stage I - GFR > or = 90 CKD Stage II - GFR 60-80 CKD Stage III - GFR 30-59 CKD Stage IV - GFR 15-29 CKD Stage V - GFR < 15 ESRD Unable to suspect or determine  Thank you!  Pryor Montes, BSN, RN [place] HIM/Clinical Documentation Specialist [email or web address] [phone]

## 2015-06-13 NOTE — Progress Notes (Signed)
SUBJECTIVE:  The patient is lying flat in bed. He has no shortness of breath. Renal function is improving. Two-dimensional echo done yesterday reveals an ejection fraction of 60%.   Filed Vitals:   06/12/15 2316 06/12/15 2319 06/13/15 0035 06/13/15 0740  BP:  122/51  127/53  Pulse:  66 66 75  Temp: 99 F (37.2 C)   100 F (37.8 C)  TempSrc: Oral   Oral  Resp:  24 21 20   Height:      Weight:      SpO2:  95% 93% 95%     Intake/Output Summary (Last 24 hours) at 06/13/15 0921 Last data filed at 06/13/15 0200  Gross per 24 hour  Intake 2095.55 ml  Output    300 ml  Net 1795.55 ml    LABS: Basic Metabolic Panel:  Recent Labs  06/11/15 1200 06/13/15 0240  NA 132* 137  K 5.9* 4.6  CL 103 103  CO2 16* 28  GLUCOSE 135* 110*  BUN 31* 11  CREATININE 1.63* 1.26*  CALCIUM 8.2* 7.9*  MG 2.6* 2.2   Liver Function Tests:  Recent Labs  06/10/15 1225 06/11/15 1200  AST 25 32  ALT 24 21  ALKPHOS 49 59  BILITOT 0.8 1.4*  PROT 7.0 6.2*  ALBUMIN 4.1 3.4*    Recent Labs  06/10/15 1225  LIPASE 14*   CBC:  Recent Labs  06/10/15 1225  06/12/15 0255 06/13/15 0240  WBC 19.4*  < > 20.9* 19.5*  NEUTROABS 17.3*  --   --   --   HGB 16.5  < > 13.0 12.0*  HCT 50.5  < > 41.9 39.4  MCV 87.8  < > 89.9 91.4  PLT 268  < > 229 217  < > = values in this interval not displayed. Cardiac Enzymes:  Recent Labs  06/12/15 1310 06/12/15 1909 06/13/15 0240  TROPONINI 0.20* 0.22* 0.20*   BNP: Invalid input(s): POCBNP D-Dimer: No results for input(s): DDIMER in the last 72 hours. Hemoglobin A1C: No results for input(s): HGBA1C in the last 72 hours. Fasting Lipid Panel:  Recent Labs  06/11/15 0247  CHOL 114  HDL 45  LDLCALC 54  TRIG 73  CHOLHDL 2.5   Thyroid Function Tests: No results for input(s): TSH, T4TOTAL, T3FREE, THYROIDAB in the last 72 hours.  Invalid input(s): FREET3  RADIOLOGY: Ct Abdomen Pelvis Wo Contrast  06/10/2015   CLINICAL DATA:   Abdominal pain, constipation. Bright red blood in stool.  EXAM: CT ABDOMEN AND PELVIS WITHOUT CONTRAST  TECHNIQUE: Multidetector CT imaging of the abdomen and pelvis was performed following the standard protocol without IV contrast.  COMPARISON:  None.  FINDINGS: The transverse colon and descending colon are decompressed which limits characterization of their walls but I suspect mild thickening of the walls of the descending colon. There is also subtle inflammatory stranding within the paracolic fat adjacent to the descending colon and trace free fluid within the lower left paracolic gutter.  Scattered diverticulosis noted within the descending and sigmoid colon, most prominent in the sigmoid colon, but no focal inflammatory change to suggest acute diverticulitis. Large and small bowel are normal in caliber throughout.  Liver, spleen, pancreas, gallbladder, and adrenal glands are within normal limits for a noncontrast study. There are 2 nonobstructing stones in the left renal pelvis, largest measuring 6 mm. No associated hydronephrosis. 10 mm hypodense mass exophytic to the lateral cortex of the right kidney is too small to characterize but probably a small  cyst. No right- sided renal stone or hydronephrosis. No ureteral or bladder calculi.  No free fluid or abscess collection. No free intraperitoneal air. Scattered atherosclerotic changes are seen along the walls of the normal- caliber abdominal aorta.  There is mild dependent atelectasis within the lower lungs bilaterally. 7 mm pulmonary nodule is also identified within the right lower lobe on axial image 5.  IMPRESSION: 1. Probable mild thickening of the walls of the descending colon suggesting a mild colitis of infectious or inflammatory nature. Associated subtle inflammatory stranding within the paracolic fat and trace free fluid in the lower left paracolic gutter. 2. Colonic diverticulosis without evidence of acute diverticulitis. 3. 7 mm pulmonary nodule  within the right lower lobe, too small to definitively characterize. If the patient is at high risk for bronchogenic carcinoma, follow-up chest CT at 3-36months is recommended. If the patient is at low risk for bronchogenic carcinoma, follow-up chest CT at 6-12 months is recommended. This recommendation follows the consensus statement: Guidelines for Management of Small Pulmonary Nodules Detected on CT Scans: A Statement from the [place] as published in Radiology 2005; 237:395-400. 4. 1 cm mass exophytic to the lateral cortex of the right kidney. This is also too small to definitively characterize but most likely a benign cyst based on CT density measurements. Would consider follow-up renal ultrasound at some point to ensure benignity. 5. Left nephrolithiasis. 6. No bowel obstruction. No abscess collection. No free intraperitoneal air. These results were called by telephone at the time of interpretation on 06/10/2015 at 4:24 pm to Dr. Joseph Berkshire , who verbally acknowledged these results.   Electronically Signed   By: Franki Cabot M.D.   On: 06/10/2015 16:25   Ct Chest Wo Contrast  06/12/2015   CLINICAL DATA:  Shortness of breath and chest pain for 2 days  EXAM: CT CHEST WITHOUT CONTRAST  TECHNIQUE: Multidetector CT imaging of the chest was performed following the standard protocol without IV contrast.  COMPARISON:  The chest radiograph 06/10/2015  FINDINGS: Mediastinum/Nodes: Thyroid is grossly unremarkable. Small AP window lymph node measures 1 cm image 19. Trace fluid within the superior pericardial recess. Probable LAD stent. Heart size upper limits of normal. No pericardial effusion.  Lungs/Pleura: Dependent trace pleural effusions with associated compressive atelectasis. Superior segment right lower lobe pulmonary nodule measures 0.6 cm, image 30. Central airways are patent.  Upper abdomen: Normal  Musculoskeletal: No acute osseous abnormality.  IMPRESSION: Trace pleural effusions with  associated compressive atelectasis.  6 mm right lower lobe pulmonary nodule. If the patient is at high risk for bronchogenic carcinoma, follow-up chest CT at 6-12 months is recommended. If the patient is at low risk for bronchogenic carcinoma, follow-up chest CT at 12 months is recommended. This recommendation follows the consensus statement: Guidelines for Management of Small Pulmonary Nodules Detected on CT Scans: A Statement from the [place] as published in Radiology 2005;237:395-400.   Electronically Signed   By: Conchita Paris M.D.   On: 06/12/2015 13:29   Dg Abd Acute W/chest  06/10/2015   CLINICAL DATA:  Sudden onset of severe abdominal pain at 0300 hours, abdominal distension, history hypertension, diverticulitis, former smoker  EXAM: DG ABDOMEN ACUTE W/ 1V CHEST  COMPARISON:  None  FINDINGS: Enlargement of cardiac silhouette.  Mediastinal contours and pulmonary vascularity normal.  Lungs clear.  No pleural effusion or pneumothorax.  Mild gaeous distention of stomach.  Question wall thickening of duodenum.  Nonobstructive bowel gas pattern otherwise seen.  No bowel dilatation  or free air.  No urinary tract calcification or bone abnormality.  IMPRESSION: Enlargement of cardiac silhouette.  Mild gaseous distention of stomach with questionable wall thickening of the descending duodenum.  Otherwise normal bowel gas pattern.   Electronically Signed   By: Lavonia Dana M.D.   On: 06/10/2015 13:23    PHYSICAL EXAM  the patient is oriented to person time and place. Affect is normal. Family is in the room. He is flat in bed. Lungs reveal a few scattered rhonchi. Cardiac exam reveals S1 and S2. The abdomen is soft at this time. He does have 1+ peripheral edema.   TELEMETRY: I have reviewed telemetry today June 13, 2015. There is sinus rhythm. There is no ventricular ectopy seen.   ASSESSMENT AND PLAN:    Colitis    This is the main presenting symptom and he continues to be treated for  this.    NSTEMI (non-ST elevated myocardial infarction)     The patient's cardiac status is stable. His echo reveals normal left ventricular function. As he continues to stabilize, we will make final decisions about ischemic workup before he goes home. No change in therapy today.    Acute on chronic kidney failure     Fortunately his renal function continues to improve.    CAD (coronary artery disease), native coronary artery    Wide-complex tachycardia     He has not had any further recurrent wide complex tachycardia. Continue to monitor.    Solitary pulmonary nodule    Dola Argyle 06/13/2015 9:21 AM

## 2015-06-13 NOTE — Progress Notes (Signed)
UR COMPLETED  

## 2015-06-13 NOTE — Care Management Note (Addendum)
Case Management Note  Patient Details  Name: Sergio Griffin MRN: [id number] Date of Birth: 11/18/1951  Subjective/Objective:                 PTA from independent with ADL's admitted with NSTEMI and colitis.  Action/Plan: Return to home when medically stable. CM to f/u with d/c disposition.  Expected Discharge Date:                  Expected Discharge Plan:  [place]  In-House Referral:     Discharge planning Services  CM Consult  Post Acute Care Choice:    Choice offered to:     DME Arranged:    DME Agency:     HH Arranged:    [place] Agency:     Status of Service:  In process, will continue to follow  Medicare Important Message Given:  Yes-second notification given Date Medicare IM Given:    Medicare IM give by:    Date Additional Medicare IM Given:    Additional Medicare Important Message give by:     If discussed at [place] of Stay Meetings, dates discussed:    Additional Comments: [facility]) [phone], Blanche [place] (Daughter) [phone]  Sharin Mons, RN 06/13/2015, 10:51 PM

## 2015-06-13 NOTE — Progress Notes (Signed)
Sergio Griffin for Heparin Indication: chest pain/ACS  Allergies  Allergen Reactions  . Sulfa Antibiotics Itching    Patient Measurements: Height: 5\' 11"  (180.3 cm) Weight: (!) 308 lb 13.8 oz (140.1 kg) IBW/kg (Calculated) : 75.3 Heparin Dosing Weight: 108.4 kg  Vital Signs: Temp: 99 F (37.2 C) (07/24 2316) Temp Source: Oral (07/24 2316) BP: 122/51 mmHg (07/24 2319) Pulse Rate: 66 (07/25 0035)  Labs:  Recent Labs  06/10/15 1222  06/11/15 0730 06/11/15 1200  06/12/15 0255 06/12/15 1310 06/12/15 1909 06/13/15 0240  HGB  --   < > 14.5  --   --  13.0  --   --  12.0*  HCT  --   < > 44.0  --   --  41.9  --   --  39.4  PLT  --   < > 238  --   --  229  --   --  217  LABPROT 14.2  --   --   --   --   --   --   --   --   INR 1.08  --   --   --   --   --   --   --   --   HEPARINUNFRC  --   < >  --   --   < > 0.38  --  0.21* 0.21*  CREATININE  --   < > 1.61* 1.63*  --   --   --   --  1.26*  TROPONINI  --   < > 0.32*  --   < > 0.25* 0.20* 0.22* 0.20*  < > = values in this interval not displayed.  Estimated Creatinine Clearance: 83.7 mL/min (by C-G formula based on Cr of 1.26).  Assessment: 64 y.o. male with NSTEMI for heparin  Goal of Therapy:  Heparin level 0.3-0.7 units/ml Monitor platelets by anticoagulation protocol: Yes   Plan:  Increase heparin 2700 units/hr Check heparin level in 8 hours.     Caryl Pina 06/13/2015,4:45 AM

## 2015-06-13 NOTE — Care Management Important Message (Signed)
Important Message  Patient Details  Name: Sergio Griffin MRN: [id number] Date of Birth: 10-Mar-1951   Medicare Important Message Given:  Yes-second notification given    Nathen May 06/13/2015, 2:27 [place] Message  Patient Details  Name: Sergio Griffin MRN: [id number] Date of Birth: 1951-05-28   Medicare Important Message Given:  Yes-second notification given    Nathen May 06/13/2015, 2:27 PM

## 2015-06-14 DIAGNOSIS — I2510 Atherosclerotic heart disease of native coronary artery without angina pectoris: Secondary | ICD-10-CM

## 2015-06-14 LAB — FECAL LACTOFERRIN, QUANT: Fecal Lactoferrin: POSITIVE

## 2015-06-14 LAB — URINE CULTURE: Culture: 4000

## 2015-06-14 LAB — CBC
HCT: 39.2 % (ref 39.0–52.0)
Hemoglobin: 12 g/dL — ABNORMAL LOW (ref 13.0–17.0)
MCH: 28.2 pg (ref 26.0–34.0)
MCHC: 30.6 g/dL (ref 30.0–36.0)
MCV: 92 fL (ref 78.0–100.0)
Platelets: 207 10*3/uL (ref 150–400)
RBC: 4.26 MIL/uL (ref 4.22–5.81)
RDW: 14.6 % (ref 11.5–15.5)
WBC: 16.6 10*3/uL — AB (ref 4.0–10.5)

## 2015-06-14 LAB — MAGNESIUM: MAGNESIUM: 2.2 mg/dL (ref 1.7–2.4)

## 2015-06-14 LAB — BASIC METABOLIC PANEL
Anion gap: 4 — ABNORMAL LOW (ref 5–15)
BUN: 12 mg/dL (ref 6–20)
CO2: 30 mmol/L (ref 22–32)
Calcium: 7.9 mg/dL — ABNORMAL LOW (ref 8.9–10.3)
Chloride: 102 mmol/L (ref 101–111)
Creatinine, Ser: 1.27 mg/dL — ABNORMAL HIGH (ref 0.61–1.24)
GFR, EST NON AFRICAN AMERICAN: 58 mL/min — AB (ref >60)
Glucose, Bld: 113 mg/dL — ABNORMAL HIGH (ref 65–99)
POTASSIUM: 4.6 mmol/L (ref 3.5–5.1)
SODIUM: 136 mmol/L (ref 135–145)

## 2015-06-14 LAB — TROPONIN I
TROPONIN I: 0.1 ng/mL — AB (ref <0.031)
Troponin I: 0.12 ng/mL — ABNORMAL HIGH (ref <0.031)
Troponin I: 0.1 ng/mL — ABNORMAL HIGH (ref <0.031)

## 2015-06-14 LAB — HEPARIN LEVEL (UNFRACTIONATED): Heparin Unfractionated: 0.5 IU/mL (ref 0.30–0.70)

## 2015-06-14 MED ORDER — HYDROMORPHONE HCL 1 MG/ML IJ SOLN
1.0000 mg | Freq: Four times a day (QID) | INTRAMUSCULAR | Status: DC | PRN
  Administered 2015-06-15 (×2): 1 mg via INTRAVENOUS
  Filled 2015-06-14 (×2): qty 1

## 2015-06-14 NOTE — Progress Notes (Signed)
[facility] TEAM 1 - Stepdown/ICU TEAM Progress Note  Sergio Griffin WLN:[id number] DOB: August 13, 1951 DOA: 06/10/2015 PCP: No primary care provider on file.  Admit HPI / Brief Narrative: 64 y.o. WM PMHx HTN, HLD, CAD with coronary angiogram with stenting approximately 5-6 years ago, GERD, S/P colonoscopy~2011 showing diverticulitis (reported by patient).   Cardiologist up in [place] that he follows with yearly. He presents to the hospital with increasing abdominal pain, bloody diarrhea that started at approximately 1 this morning. He has had multiple episodes of bloody diarrhea that has progressed throughout the morning, accompanied by feeling extremely ill with left-sided, substernal chest pain, diaphoresis, cold sweats, and retching. At approximately 11 AM, the patient decided to present to the emergency room for evaluation. He has had no bloody diarrhea since that time.  With his chest pain, the patient has had left-sided, substernal chest pain doubt radiation, associated with diaphoresis, cold sweats, nausea. He had the chest pain for approximately 6-7 hours, but this has gradually trailed off since being at the hospital. He currently reports no chest pain.    HPI/Subjective: 7/26 sleepy but arousable. Patient sleeps all day long and then is up during the night. A/O 4,Negative CP, negative SOB, positive DOE. Negative BRBPR or melanic stool.    Assessment/Plan: NSTEMI - Admitting physician discussed this patient with Dr. Tommi Rumps of (cardiology) recommended starting anticoagulation. -Continue heparin drip; has bloody infectious colitis. May have to DC heparin if continues.  -Continue Nitropaste; currently CP free -Continue to trend troponin; trending down. -Echocardiogram; see results below -Lipid panel within ADA guidelines; however started on Crestor 20 mg daily per cardiology (cardioprotective)  HTN -Continue metoprolol 12.5 mg BID -Continue Nitropaste  Pulmonary nodule (previous  smoker) -6 mm pulmonary nodule in RLL -noncontrast chest CT; no further nodules in remainder of lung fields -Patient previous smoker require follow-up chest CT in 6-12 months  Infectious Colitis/Sepsis unspecified organism -Continue Cipro and Flagyl -Stool culture NGTD -5W-normal saline 50 ml/hr -Maintain NPO -No BRBPR overnight, hemoglobin stable. If bleeding resumes will consult GI in a.m.  UTI -Urine culture negative -Typical organisms would be covered by current antibiotics  Acute on Chronic kidney failure (no previous Cr for baseline) -Continue hydration -Cr trending down -Hold all nephrotoxic medication  lactic acidosis -Normalized  -Continue hydrate   Hyperkalemia -At goal   Somnolence  - most likely secondary to patient sleeping throughout day and then on up all night (sleep cycle reversed) -During the day blinds open curtain open patient up to chair. -Decrease sedating medication    Code Status: FULL Family Communication: family present at time of exam Disposition Plan: Per cardiology    Consultants: Dr.Daniel Tommi Rumps (cardiology)    Procedure/Significant Events: 7/22 CT abdomen pelvis without contrast;-Probable mild thickening walls of descending colon mild colitis   infectious vs inflammatory nature. -Associated subtle inflammatory stranding W/I paracolic fat and trace free fluid in the lower left paracolic gutter. -7 mm pulmonary nodule within the RLL  -1 cm mass exophytic to the lateral cortex of the right kidney; most likely benign cyst -Left nephrolithiasis. 7/24 Echocardiogram; - Left ventricle: moderateLVH.-LVEF= 60%.  7/24 CT chest without contrast;-pleural effusions with associated compressive atelectasis. -6 mm RLL pulmonary nodule.   Culture 7/22 urine negative  7/23 blood left/right hand NGTD 7/23 MRSA by PCR negative 7/23 stool NGTD   Antibiotics: Ciprofloxacin 7/22>> Flagyl 7/22>>  DVT prophylaxis: Heparin  drip   Devices    LINES / TUBES:      Continuous Infusions: .  sodium chloride 50 mL/hr at 06/14/15 0912  . heparin 2,900 Units/hr (06/14/15 0911)    Objective: VITAL SIGNS: Temp: 98.3 F (36.8 C) (07/26 1559) Temp Source: Oral (07/26 1559) BP: 135/74 mmHg (07/26 1559) Pulse Rate: 70 (07/26 1559) SPO2; FIO2:   Intake/Output Summary (Last 24 hours) at 06/14/15 1729 Last data filed at 06/14/15 1100  Gross per 24 hour  Intake 1477.42 ml  Output      0 ml  Net 1477.42 ml     Exam: General: Sleepy but arousable A/O 4, NAD, No acute respiratory distress Eyes: Negative headache, eye pain, double vision, negative scleral hemorrhage ENT: Negative Runny nose, negative ear pain, negative tinnitus, negative gingival bleeding Neck:  Negative scars, masses, torticollis, lymphadenopathy, JVD Lungs: Clear to auscultation bilaterally without wheezes or crackles Cardiovascular: Regular rate and rhythm without murmur gallop or rub normal S1 and S2 Abdomen:  Morbidly obese, negative abdominal pain, soft, bowel sounds positive, no rebound, no ascites, no appreciable mass Extremities: No significant cyanosis, clubbing, or edema bilateral lower extremities Psychiatric:  Negative depression, negative anxiety, negative fatigue, negative mania  Neurologic:  Cranial nerves II through XII intact, tongue/uvula midline, all extremities muscle strength 5/5, sensation intact throughout,  negative dysarthria, negative expressive aphasia, negative receptive aphasia.     Data Reviewed: Basic Metabolic Panel:  Recent Labs Lab 06/10/15 1225 06/11/15 0730 06/11/15 1200 06/13/15 0240 06/14/15 0305  NA 134* 132* 132* 137 136  K 5.1 6.0* 5.9* 4.6 4.6  CL 101 103 103 103 102  CO2 23 18* 16* 28 30  GLUCOSE 253* 138* 135* 110* 113*  BUN 30* 28* 31* 11 12  CREATININE 2.25* 1.61* 1.63* 1.26* 1.27*  CALCIUM 9.5 8.3* 8.2* 7.9* 7.9*  MG  --   --  2.6* 2.2 2.2   Liver Function Tests:  Recent  Labs Lab 06/10/15 1225 06/11/15 1200  AST 25 32  ALT 24 21  ALKPHOS 49 59  BILITOT 0.8 1.4*  PROT 7.0 6.2*  ALBUMIN 4.1 3.4*    Recent Labs Lab 06/10/15 1225  LIPASE 14*   No results for input(s): AMMONIA in the last 168 hours. CBC:  Recent Labs Lab 06/10/15 1225 06/11/15 0730 06/12/15 0255 06/13/15 0240 06/14/15 0305  WBC 19.4* 21.2* 20.9* 19.5* 16.6*  NEUTROABS 17.3*  --   --   --   --   HGB 16.5 14.5 13.0 12.0* 12.0*  HCT 50.5 44.0 41.9 39.4 39.2  MCV 87.8 87.5 89.9 91.4 92.0  PLT 268 238 229 217 207   Cardiac Enzymes:  Recent Labs Lab 06/13/15 0240 06/13/15 1140 06/13/15 1905 06/14/15 0305 06/14/15 1057  TROPONINI 0.20* 0.16* 0.10* 0.12* 0.10*   BNP (last 3 results)  Recent Labs  06/10/15 1225  BNP 16.0    ProBNP (last 3 results) No results for input(s): PROBNP in the last 8760 hours.  CBG: No results for input(s): GLUCAP in the last 168 hours.  Recent Results (from the past 240 hour(s))  Urine culture     Status: None   Collection Time: 06/10/15  1:58 PM  Result Value Ref Range Status   Specimen Description URINE, CLEAN CATCH  Final   Special Requests NONE  Final   Culture   Final    4,000 COLONIES/mL INSIGNIFICANT GROWTH Performed at [facility]    Report Status 06/14/2015 FINAL  Final  MRSA PCR Screening     Status: None   Collection Time: 06/11/15  1:55 AM  Result Value Ref Range Status  MRSA by PCR NEGATIVE NEGATIVE Final    Comment:        The GeneXpert MRSA Assay (FDA approved for NASAL specimens only), is one component of a comprehensive MRSA colonization surveillance program. It is not intended to diagnose MRSA infection nor to guide or monitor treatment for MRSA infections.   Stool culture     Status: None (Preliminary result)   Collection Time: 06/11/15  9:00 PM  Result Value Ref Range Status   Specimen Description STOOL  Final   Special Requests NONE  Final   Culture   Final    NO SUSPICIOUS COLONIES,  CONTINUING TO HOLD Note: REDUCED NORMAL FLORA PRESENT Performed at [facility]    Report Status PENDING  Incomplete  Culture, blood (routine x 2)     Status: None (Preliminary result)   Collection Time: 06/11/15  9:50 PM  Result Value Ref Range Status   Specimen Description BLOOD LEFT HAND  Final   Special Requests IN PEDIATRIC BOTTLE 2CC  Final   Culture NO GROWTH 3 DAYS  Final   Report Status PENDING  Incomplete  Culture, blood (routine x 2)     Status: None (Preliminary result)   Collection Time: 06/11/15  9:56 PM  Result Value Ref Range Status   Specimen Description BLOOD RIGHT HAND  Final   Special Requests IN PEDIATRIC BOTTLE 2CC  Final   Culture NO GROWTH 3 DAYS  Final   Report Status PENDING  Incomplete     Studies:  Recent x-ray studies have been reviewed in detail by the Attending Physician  Scheduled Meds:  Scheduled Meds: . antiseptic oral rinse  7 mL Mouth Rinse q12n4p  . aspirin EC  162 mg Oral Daily  . buPROPion  300 mg Oral QHS  . chlorhexidine  15 mL Mouth Rinse BID  . ciprofloxacin  400 mg Intravenous Q12H  . clonazePAM  0.5 mg Oral BID  . ipratropium-albuterol  3 mL Nebulization Once  . latanoprost  1 drop Both Eyes QHS  . metoprolol tartrate  12.5 mg Oral BID  . metroNIDAZOLE  500 mg Intravenous 4 times per day  . nitroGLYCERIN  1 inch Topical 4 times per day  . omega-3 acid ethyl esters  1 g Oral Daily  . rosuvastatin  20 mg Oral QPM  . sucralfate  1 g Oral TID WC & HS    Time spent on care of this patient: 40 mins   Jenet Durio, Geraldo Docker , MD  [facility] Office  [phone] Pager - [phone]  On-Call/Text Page:      [email or web address]      password [email or web address]  If 7PM-7AM, please contact night-coverage [email or web address] Password [email or web address] 06/14/2015, 5:29 PM   LOS: 4 days   Care during the described time interval was provided by me .  I have reviewed this patient's available data, including medical history, events of note, physical  examination, and all test results as part of my evaluation. I have personally reviewed and interpreted all radiology studies.   Dia Crawford, MD [phone] Pager

## 2015-06-14 NOTE — Progress Notes (Signed)
I have reviewed telemetry today. There is normal sinus rhythm. Continue to follow.  Daryel November, MD

## 2015-06-14 NOTE — Progress Notes (Signed)
ANTICOAGULATION CONSULT NOTE - Follow Up Consult  Pharmacy Consult for heparin Indication: Chest pain / ACS  Allergies  Allergen Reactions  . Sulfa Antibiotics Itching    Patient Measurements: Height: 5\' 11"  (180.3 cm) Weight: (!) 308 lb 13.8 oz (140.1 kg) IBW/kg (Calculated) : 75.3 Heparin Dosing Weight: 108.4 kg  Vital Signs: Temp: 98.5 F (36.9 C) (07/26 0424) Temp Source: Oral (07/26 0424) BP: 150/60 mmHg (07/26 0424) Pulse Rate: 75 (07/26 0424)  Labs:  Recent Labs  06/11/15 1200  06/12/15 0255  06/13/15 0240 06/13/15 1140 06/13/15 1905 06/14/15 0305  HGB  --   < > 13.0  --  12.0*  --   --  12.0*  HCT  --   --  41.9  --  39.4  --   --  39.2  PLT  --   --  229  --  217  --   --  207  HEPARINUNFRC  --   < > 0.38  < > 0.21* 0.32 0.52 0.50  CREATININE 1.63*  --   --   --  1.26*  --   --  1.27*  TROPONINI  --   < > 0.25*  < > 0.20* 0.16* 0.10* 0.12*  < > = values in this interval not displayed.  Estimated Creatinine Clearance: 83 mL/min (by C-G formula based on Cr of 1.27).  Assessment: 64 yo male admitted with infectious colitis with bloody diarrhea and chest pain. Cards consulted and found pt to have NSTEMI. Will make final decisions on workup before he goes home. HL now therapeutic x 3 with am HL of 0.5. Hgb trending down to 12, plts wnl. No issues or s/s of bleed.   Goal of Therapy:  Heparin level 0.3-0.7 units/ml Monitor platelets by anticoagulation protocol: Yes  Plan:  Continue heparin gtt at 2900 units/hr Monitor daily HL, CBC, s/s of bleed  Charmon Thorson J 06/14/2015,8:22 AM

## 2015-06-15 DIAGNOSIS — A09 Infectious gastroenteritis and colitis, unspecified: Secondary | ICD-10-CM

## 2015-06-15 DIAGNOSIS — I10 Essential (primary) hypertension: Secondary | ICD-10-CM | POA: Diagnosis present

## 2015-06-15 DIAGNOSIS — N3000 Acute cystitis without hematuria: Secondary | ICD-10-CM | POA: Diagnosis present

## 2015-06-15 LAB — BASIC METABOLIC PANEL
Anion gap: 4 — ABNORMAL LOW (ref 5–15)
BUN: 11 mg/dL (ref 6–20)
CO2: 30 mmol/L (ref 22–32)
Calcium: 7.8 mg/dL — ABNORMAL LOW (ref 8.9–10.3)
Chloride: 103 mmol/L (ref 101–111)
Creatinine, Ser: 1.11 mg/dL (ref 0.61–1.24)
Glucose, Bld: 92 mg/dL (ref 65–99)
Potassium: 3.9 mmol/L (ref 3.5–5.1)
Sodium: 137 mmol/L (ref 135–145)

## 2015-06-15 LAB — CBC
HCT: 37.2 % — ABNORMAL LOW (ref 39.0–52.0)
Hemoglobin: 11.4 g/dL — ABNORMAL LOW (ref 13.0–17.0)
MCH: 27.9 pg (ref 26.0–34.0)
MCHC: 30.6 g/dL (ref 30.0–36.0)
MCV: 91.2 fL (ref 78.0–100.0)
PLATELETS: 244 10*3/uL (ref 150–400)
RBC: 4.08 MIL/uL — ABNORMAL LOW (ref 4.22–5.81)
RDW: 14.4 % (ref 11.5–15.5)
WBC: 14.5 10*3/uL — AB (ref 4.0–10.5)

## 2015-06-15 LAB — MAGNESIUM: Magnesium: 2.1 mg/dL (ref 1.7–2.4)

## 2015-06-15 LAB — TROPONIN I
TROPONIN I: 0.06 ng/mL — AB (ref <0.031)
Troponin I: 0.08 ng/mL — ABNORMAL HIGH (ref <0.031)
Troponin I: 0.09 ng/mL — ABNORMAL HIGH (ref <0.031)

## 2015-06-15 LAB — STOOL CULTURE

## 2015-06-15 LAB — HEPARIN LEVEL (UNFRACTIONATED)
Heparin Unfractionated: 0.67 IU/mL (ref 0.30–0.70)
Heparin Unfractionated: 0.88 IU/mL — ABNORMAL HIGH (ref 0.30–0.70)

## 2015-06-15 MED ORDER — PANTOPRAZOLE SODIUM 40 MG IV SOLR
40.0000 mg | Freq: Two times a day (BID) | INTRAVENOUS | Status: DC
  Administered 2015-06-15 – 2015-06-16 (×3): 40 mg via INTRAVENOUS
  Filled 2015-06-15 (×6): qty 40

## 2015-06-15 MED ORDER — ZOLPIDEM TARTRATE 5 MG PO TABS: 5.0000 mg | ORAL_TABLET | Freq: Every evening | ORAL | Status: DC | PRN

## 2015-06-15 MED ORDER — DEXTROSE-NACL 5-0.9 % IV SOLN
INTRAVENOUS | Status: DC
  Administered 2015-06-15 – 2015-06-16 (×2): via INTRAVENOUS

## 2015-06-15 MED ORDER — METHOCARBAMOL 1000 MG/10ML IJ SOLN
500.0000 mg | Freq: Three times a day (TID) | INTRAVENOUS | Status: DC | PRN
  Filled 2015-06-15: qty 5

## 2015-06-15 MED ORDER — HYDRALAZINE HCL 20 MG/ML IJ SOLN: 5.0000 mg | INTRAMUSCULAR | Status: DC | PRN

## 2015-06-15 NOTE — Progress Notes (Signed)
I am continuing to follow the patient as needed. Telemetry reveals normal sinus rhythm with no significant arrhythmias.  Daryel November, MD

## 2015-06-15 NOTE — Progress Notes (Signed)
[facility] TEAM 1 - Stepdown/ICU TEAM Progress Note  Sergio Griffin WJX:[id number] DOB: [birth year] DOA: 06/10/2015 PCP: No primary care provider on file.  Admit HPI / Brief Narrative: 64 y.o. WM PMHx HTN, HLD, CAD with coronary angiogram with stenting approximately 5-6 years ago, GERD, S/P colonoscopy~2011 showing diverticulitis (reported by patient).   Cardiologist up in [place] that he follows with yearly. He presents to the hospital with increasing abdominal pain, bloody diarrhea that started at approximately 1 this morning. He has had multiple episodes of bloody diarrhea that has progressed throughout the morning, accompanied by feeling extremely ill with left-sided, substernal chest pain, diaphoresis, cold sweats, and retching. At approximately 11 AM, the patient decided to present to the emergency room for evaluation. He has had no bloody diarrhea since that time.  With his chest pain, the patient has had left-sided, substernal chest pain doubt radiation, associated with diaphoresis, cold sweats, nausea. He had the chest pain for approximately 6-7 hours, but this has gradually trailed off since being at the hospital. He currently reports no chest pain.    HPI/Subjective: 7/27 A/O 4, recurrent BRBPR large quantity per patient and RN. Recurrent abdominal pain with return of bleeding. Negative CP, negative SOB, negative DOE   Assessment/Plan: NSTEMI - Admitting physician discussed this patient with Dr. Tommi Rumps of (cardiology) recommended starting anticoagulation. -Have spoken to pharmacy will decrease heparin to minimal detectable level secondary to recurrent GI bleed.   -Continue Nitropaste; currently CP free -Troponins trending down. -Echocardiogram; see results below -Continue Crestor 20 mg daily per cardiology (cardioprotective)  HTN -Continue metoprolol 12.5 mg BID -Continue Nitropaste -Start hydralazine IV 5 mg PRN SBP> 160 or DBP> 100  Pulmonary nodule (previous  smoker) -6 mm pulmonary nodule in RLL -Patient previous smoker require follow-up chest CT in 6-12 months  Infectious Colitis/Sepsis unspecified organism -Continue Cipro and Flagyl -Stool culture NGTD -5W-normal saline 50 ml/hr -Maintain NPO; patient has not eaten since 7/22 -Positive BRBPR; see NSTEMI, start Protonix IV 40 mg BID  UTI -Urine culture negative -Typical organisms would be covered by current antibiotics  Acute on Chronic kidney failure stage II (no previous Cr for baseline) -Continue hydration -Cr trending down -Hold all nephrotoxic medication  Hyperkalemia -At goal      Code Status: FULL Family Communication: family present at time of exam Disposition Plan: Per cardiology    Consultants: Dr.Daniel Tommi Rumps (cardiology)    Procedure/Significant Events: 7/22 CT abdomen pelvis without contrast;-Probable mild thickening walls of descending colon mild colitis   infectious vs inflammatory nature. -Associated subtle inflammatory stranding W/I paracolic fat and trace free fluid in the lower left paracolic gutter. -7 mm pulmonary nodule within the RLL  -1 cm mass exophytic to the lateral cortex of the right kidney; most likely benign cyst -Left nephrolithiasis. 7/24 Echocardiogram; - Left ventricle: moderateLVH.-LVEF= 60%.  7/24 CT chest without contrast;-pleural effusions with associated compressive atelectasis. -6 mm RLL pulmonary nodule.   Culture 7/22 urine negative  7/23 blood left/right hand NGTD 7/23 MRSA by PCR negative 7/23 stool NGTD   Antibiotics: Ciprofloxacin 7/22>> Flagyl 7/22>>  DVT prophylaxis: Heparin drip   Devices    LINES / TUBES:      Continuous Infusions: . dextrose 5 % and 0.9% NaCl    . heparin 2,850 Units/hr (06/15/15 1800)    Objective: VITAL SIGNS: Temp: 98.6 F (37 C) (07/27 1659) Temp Source: Oral (07/27 1659) BP: 171/74 mmHg (07/27 1659) Pulse Rate: 70 (07/27 1659) SPO2; FIO2:   Intake/Output  Summary (  Last 24 hours) at 06/15/15 1940 Last data filed at 06/15/15 1248  Gross per 24 hour  Intake   1489 ml  Output      0 ml  Net   1489 ml     Exam: General:  A/O 4, NAD, No acute respiratory distress Eyes: Negative headache, eye pain, double vision, negative scleral hemorrhage ENT: Negative Runny nose, negative ear pain, negative tinnitus, negative gingival bleeding Neck:  Negative scars, masses, torticollis, lymphadenopathy, JVD Lungs: Clear to auscultation bilaterally without wheezes or crackles Cardiovascular: Regular rate and rhythm without murmur gallop or rub normal S1 and S2 Abdomen:  Morbidly obese, positive diffuse abdominal pain, soft, bowel sounds positive, no rebound, no ascites, no appreciable mass Extremities: No significant cyanosis, clubbing, or edema bilateral lower extremities Psychiatric:  Negative depression, negative anxiety, negative fatigue, negative mania  Neurologic:  Cranial nerves II through XII intact, tongue/uvula midline, all extremities muscle strength 5/5, sensation intact throughout,  negative dysarthria, negative expressive aphasia, negative receptive aphasia.     Data Reviewed: Basic Metabolic Panel:  Recent Labs Lab 06/11/15 0730 06/11/15 1200 06/13/15 0240 06/14/15 0305 06/15/15 0321  NA 132* 132* 137 136 137  K 6.0* 5.9* 4.6 4.6 3.9  CL 103 103 103 102 103  CO2 18* 16* 28 30 30   GLUCOSE 138* 135* 110* 113* 92  BUN 28* 31* 11 12 11   CREATININE 1.61* 1.63* 1.26* 1.27* 1.11  CALCIUM 8.3* 8.2* 7.9* 7.9* 7.8*  MG  --  2.6* 2.2 2.2 2.1   Liver Function Tests:  Recent Labs Lab 06/10/15 1225 06/11/15 1200  AST 25 32  ALT 24 21  ALKPHOS 49 59  BILITOT 0.8 1.4*  PROT 7.0 6.2*  ALBUMIN 4.1 3.4*    Recent Labs Lab 06/10/15 1225  LIPASE 14*   No results for input(s): AMMONIA in the last 168 hours. CBC:  Recent Labs Lab 06/10/15 1225 06/11/15 0730 06/12/15 0255 06/13/15 0240 06/14/15 0305 06/15/15 0321  WBC 19.4*  21.2* 20.9* 19.5* 16.6* 14.5*  NEUTROABS 17.3*  --   --   --   --   --   HGB 16.5 14.5 13.0 12.0* 12.0* 11.4*  HCT 50.5 44.0 41.9 39.4 39.2 37.2*  MCV 87.8 87.5 89.9 91.4 92.0 91.2  PLT 268 238 229 217 207 244   Cardiac Enzymes:  Recent Labs Lab 06/14/15 0305 06/14/15 1057 06/14/15 1939 06/15/15 0321 06/15/15 1321  TROPONINI 0.12* 0.10* 0.10* 0.08* 0.09*   BNP (last 3 results)  Recent Labs  06/10/15 1225  BNP 16.0    ProBNP (last 3 results) No results for input(s): PROBNP in the last 8760 hours.  CBG: No results for input(s): GLUCAP in the last 168 hours.  Recent Results (from the past 240 hour(s))  Urine culture     Status: None   Collection Time: 06/10/15  1:58 PM  Result Value Ref Range Status   Specimen Description URINE, CLEAN CATCH  Final   Special Requests NONE  Final   Culture   Final    4,000 COLONIES/mL INSIGNIFICANT GROWTH Performed at [facility]    Report Status 06/14/2015 FINAL  Final  MRSA PCR Screening     Status: None   Collection Time: 06/11/15  1:55 AM  Result Value Ref Range Status   MRSA by PCR NEGATIVE NEGATIVE Final    Comment:        The GeneXpert MRSA Assay (FDA approved for NASAL specimens only), is one component of a comprehensive MRSA colonization  surveillance program. It is not intended to diagnose MRSA infection nor to guide or monitor treatment for MRSA infections.   Stool culture     Status: None   Collection Time: 06/11/15  9:00 PM  Result Value Ref Range Status   Specimen Description STOOL  Final   Special Requests NONE  Final   Culture   Final    NO SALMONELLA, SHIGELLA, CAMPYLOBACTER, YERSINIA, OR E.COLI [id number] ISOLATED Note: REDUCED NORMAL FLORA PRESENT Performed at [facility]    Report Status 06/15/2015 FINAL  Final  Culture, blood (routine x 2)     Status: None (Preliminary result)   Collection Time: 06/11/15  9:50 PM  Result Value Ref Range Status   Specimen Description BLOOD LEFT HAND   Final   Special Requests IN PEDIATRIC BOTTLE 2CC  Final   Culture NO GROWTH 4 DAYS  Final   Report Status PENDING  Incomplete  Culture, blood (routine x 2)     Status: None (Preliminary result)   Collection Time: 06/11/15  9:56 PM  Result Value Ref Range Status   Specimen Description BLOOD RIGHT HAND  Final   Special Requests IN PEDIATRIC BOTTLE 2CC  Final   Culture NO GROWTH 4 DAYS  Final   Report Status PENDING  Incomplete     Studies:  Recent x-ray studies have been reviewed in detail by the Attending Physician  Scheduled Meds:  Scheduled Meds: . antiseptic oral rinse  7 mL Mouth Rinse q12n4p  . aspirin EC  162 mg Oral Daily  . buPROPion  300 mg Oral QHS  . chlorhexidine  15 mL Mouth Rinse BID  . ciprofloxacin  400 mg Intravenous Q12H  . clonazePAM  0.5 mg Oral BID  . ipratropium-albuterol  3 mL Nebulization Once  . latanoprost  1 drop Both Eyes QHS  . metoprolol tartrate  12.5 mg Oral BID  . metroNIDAZOLE  500 mg Intravenous 4 times per day  . nitroGLYCERIN  1 inch Topical 4 times per day  . omega-3 acid ethyl esters  1 g Oral Daily  . pantoprazole (PROTONIX) IV  40 mg Intravenous BID  . rosuvastatin  20 mg Oral QPM  . sucralfate  1 g Oral TID WC & HS    Time spent on care of this patient: 40 mins   Tametha Banning, Geraldo Docker , MD  [facility] Office  [phone] Pager - [phone]  On-Call/Text Page:      [email or web address]      password [email or web address]  If 7PM-7AM, please contact night-coverage [email or web address] Password [email or web address] 06/15/2015, 7:40 PM   LOS: 5 days   Care during the described time interval was provided by me .  I have reviewed this patient's available data, including medical history, events of note, physical examination, and all test results as part of my evaluation. I have personally reviewed and interpreted all radiology studies.   Dia Crawford, MD [phone] Pager

## 2015-06-15 NOTE — Care Management Important Message (Signed)
Important Message  Patient Details  Name: Sergio Griffin MRN: [id number] Date of Birth: 04/27/1951   Medicare Important Message Given:  Yes-third notification given    Nathen May 06/15/2015, 12:08 [place] Message  Patient Details  Name: Sergio Griffin MRN: [id number] Date of Birth: 1951/01/10   Medicare Important Message Given:  Yes-third notification given    Nathen May 06/15/2015, 12:08 PM

## 2015-06-15 NOTE — Progress Notes (Addendum)
ANTICOAGULATION CONSULT NOTE - Follow Up Consult  Pharmacy Consult for Heparin Indication: Chest pain / ACS  Allergies  Allergen Reactions  . Sulfa Antibiotics Itching    Patient Measurements: Height: 5\' 11"  (180.3 cm) Weight: (!) 308 lb 13.8 oz (140.1 kg) IBW/kg (Calculated) : 75.3  Vital Signs: Temp: 98.3 F (36.8 C) (07/27 0744) Temp Source: Oral (07/27 0744) BP: 139/58 mmHg (07/26 2212) Pulse Rate: 73 (07/26 2212)  Labs:  Recent Labs  06/13/15 0240  06/13/15 1905 06/14/15 0305 06/14/15 1057 06/14/15 1939 06/15/15 0321  HGB 12.0*  --   --  12.0*  --   --  11.4*  HCT 39.4  --   --  39.2  --   --  37.2*  PLT 217  --   --  207  --   --  244  HEPARINUNFRC 0.21*  < > 0.52 0.50  --   --  0.67  CREATININE 1.26*  --   --  1.27*  --   --  1.11  TROPONINI 0.20*  < > 0.10* 0.12* 0.10* 0.10* 0.08*  < > = values in this interval not displayed.  Estimated Creatinine Clearance: 95 mL/min (by C-G formula based on Cr of 1.11).   Assessment: 64 yo male admitted with infectious colitis with bloody diarrhea and chest pain. Cards consulted and found pt to have NSTEMI. HL has been therapeutic and now at 0.67 this am. Continues to have some bloody colitis and may need to stop heparin gtt if continues per MD. Per cards: possibility of cardiac catheterization before he goes home. Hgb slowly trending down to 11.4, plts wnl.  Goal of Therapy:  Heparin level 0.3-0.7 units/ml Monitor platelets by anticoagulation protocol: Yes   Plan:  Decrease heparin gtt slightly to 2,850 units/hr Monitor daily HL, CBC, s/s of bleed  Sergio Griffin,Sergio Griffin 06/15/2015,8:25 AM  Addendum: Was asked by Dr. Sherral Hammers to decrease the heparin goal to 0.2-0.4 due to GI bleeding.   I ordered a heparin level which came back as 0.88 which is above the goal. Plan: decrease heparin to 2500 units/hr and recheck heparin level in the morning.  Heide Guile, PharmD, BCPS-AQ ID Clinical Pharmacist Pager [phone]

## 2015-06-16 ENCOUNTER — Encounter (HOSPITAL_COMMUNITY): Payer: Self-pay | Admitting: Physician Assistant

## 2015-06-16 DIAGNOSIS — K559 Vascular disorder of intestine, unspecified: Secondary | ICD-10-CM | POA: Diagnosis present

## 2015-06-16 DIAGNOSIS — R933 Abnormal findings on diagnostic imaging of other parts of digestive tract: Secondary | ICD-10-CM

## 2015-06-16 DIAGNOSIS — R197 Diarrhea, unspecified: Secondary | ICD-10-CM

## 2015-06-16 DIAGNOSIS — K921 Melena: Secondary | ICD-10-CM

## 2015-06-16 LAB — BASIC METABOLIC PANEL
ANION GAP: 5 (ref 5–15)
BUN: 9 mg/dL (ref 6–20)
CO2: 28 mmol/L (ref 22–32)
Calcium: 7.8 mg/dL — ABNORMAL LOW (ref 8.9–10.3)
Chloride: 103 mmol/L (ref 101–111)
Creatinine, Ser: 0.94 mg/dL (ref 0.61–1.24)
GLUCOSE: 135 mg/dL — AB (ref 65–99)
Potassium: 3.9 mmol/L (ref 3.5–5.1)
Sodium: 136 mmol/L (ref 135–145)

## 2015-06-16 LAB — TROPONIN I
TROPONIN I: 0.05 ng/mL — AB (ref <0.031)
Troponin I: 0.05 ng/mL — ABNORMAL HIGH (ref <0.031)
Troponin I: 0.06 ng/mL — ABNORMAL HIGH (ref <0.031)

## 2015-06-16 LAB — CULTURE, BLOOD (ROUTINE X 2)
CULTURE: NO GROWTH
Culture: NO GROWTH

## 2015-06-16 LAB — CBC
HEMATOCRIT: 37.5 % — AB (ref 39.0–52.0)
Hemoglobin: 11.8 g/dL — ABNORMAL LOW (ref 13.0–17.0)
MCH: 28.2 pg (ref 26.0–34.0)
MCHC: 31.5 g/dL (ref 30.0–36.0)
MCV: 89.7 fL (ref 78.0–100.0)
PLATELETS: 217 10*3/uL (ref 150–400)
RBC: 4.18 MIL/uL — ABNORMAL LOW (ref 4.22–5.81)
RDW: 14.2 % (ref 11.5–15.5)
WBC: 18.3 10*3/uL — ABNORMAL HIGH (ref 4.0–10.5)

## 2015-06-16 LAB — HEPARIN LEVEL (UNFRACTIONATED): Heparin Unfractionated: 0.73 IU/mL — ABNORMAL HIGH (ref 0.30–0.70)

## 2015-06-16 LAB — MAGNESIUM: Magnesium: 2.2 mg/dL (ref 1.7–2.4)

## 2015-06-16 MED ORDER — HEPARIN (PORCINE) IN NACL 100-0.45 UNIT/ML-% IJ SOLN
2300.0000 [IU]/h | INTRAMUSCULAR | Status: DC
  Filled 2015-06-16 (×2): qty 250

## 2015-06-16 NOTE — Progress Notes (Signed)
ANTICOAGULATION CONSULT NOTE - Follow Up Consult  Pharmacy Consult for Heparin Indication: Chest pain / ACS  Allergies  Allergen Reactions  . Sulfa Antibiotics Itching    Patient Measurements: Height: 5\' 11"  (180.3 cm) Weight: (!) 308 lb 13.8 oz (140.1 kg) IBW/kg (Calculated) : 75.3  Vital Signs: Temp: 97.6 F (36.4 C) (07/28 0520) Temp Source: Oral (07/28 0520) BP: 151/68 mmHg (07/28 0520) Pulse Rate: 73 (07/28 0520)  Labs:  Recent Labs  06/14/15 0305  06/15/15 0321 06/15/15 1321 06/15/15 1920 06/15/15 2051 06/16/15 0327  HGB 12.0*  --  11.4*  --   --   --  11.8*  HCT 39.2  --  37.2*  --   --   --  37.5*  PLT 207  --  244  --   --   --  217  HEPARINUNFRC 0.50  --  0.67  --   --  0.88* 0.73*  CREATININE 1.27*  --  1.11  --   --   --  0.94  TROPONINI 0.12*  < > 0.08* 0.09* 0.06*  --  0.05*  < > = values in this interval not displayed.  Estimated Creatinine Clearance: 112.1 mL/min (by C-G formula based on Cr of 0.94).  Assessment: 64 yo male admitted with infectious colitis with bloody diarrhea and chest pain. Cards consulted and found pt to have NSTEMI. HL remains elevated despite rate reductions. Also RN note of bloody stools this AM. H/H is low but stable and platelets are WNL. MD aware of bloody stools and to evaluate use of heparin.   Goal of Therapy:  Heparin level 0.2-0.4 units/ml Monitor platelets by anticoagulation protocol: Yes   Plan:  - F/u MD assessment of bleeding - For now, hold heparin x 30 minutes then resume at reduced rate of 2300 units/hr - Check an 8 hour heparin level - Continue daily heparin level and CBC  Salome Arnt, PharmD, BCPS Pager # [phone] 06/16/2015 8:32 AM

## 2015-06-16 NOTE — Progress Notes (Signed)
Pt had 3 bloody stools 2 of them moderate in size and one small in size.

## 2015-06-16 NOTE — Consult Note (Signed)
[facility] Consult: 9:54 AM 06/16/2015  LOS: 6 days    Referring Provider: Dr Dia Crawford Primary Care Physician:  No primary care provider on file. Primary Gastroenterologist:  Althia Forts.      Reason for Consultation:  Bloody diarrhea.     HPI: Sergio Griffin is a 64 y.o. male.  Resident of [place], [place].  Hx CAD and prior cardiac stent.   Admitted a week ago with bloody diarrhea, abdominal pain, lactic acidosis, AKI.  Ruled in for NSTEMI but has not had cardiac cath.  LVEF 60% by echo.   CT scan showed descending colitis. Colitis treated as infectious with cipro/flagyl. Incidental finding of RLL pulmonary nodule.  Bloody stools resolved and was having brown stool.  But bloody stool recurred yesterday and again today.  Pt states he had a colonoscopy uin [place] 5-6 years ago at which time a small polyp was noted (pt says not removed) and he was instructed to repeat colonoscopy in 10 years. He also says he was noted to have diverticulosis. He also says that in 1998 he had a gastric ulcer diagnosed by EGD in [place]. He had previously seen Dr. Chesley Noon, who retired.  He says in Dec of 2015 he developed cold symptoms that persisted, and he was seen in a walk in clinic in [place] in January and diagnosed with pneumonia. He was treated with a course of antibiotics. In Feb he felt very bloated and began to have crampy lower abd pain. He used prune juice, which alleviated symptoms for a short time, but then they came back. Around that time he was seen at a clinic in [place] and diagnosed with diverticulitis. He states he was treated with a ten-day course of Cipro and Flagyl. He then began to notice that he would have thin hard stools daily and never felt like he fully emptied. He started using milk of magnesia one  dose every 5-7 days so that he would feel he was cleaning out. Over the past 2 months he has had several episodes of copious amount of mucus with his stool but has never seen blood. In early July of this year he states he was having suprapubic pain and was seen by a urologist in [place] and diagnosed with prostatitis and was given a 14 day course of Cipro which he finished about a week prior to this admission. About a week ago he developed bloody diarrhea with abdominal pain. As noted above, CT showed descending colitis. He has been treated with Cipro and Flagyl and had some resolution of his bleeding until last night when he had 2 bowel movements with blood. He denies tenesmus. He is unaware of her family history of colon cancer, colon polyps, or inflammatory bowel disease however he states his mother had "colon issues" in her 85s that resolved by the time she was 74. He states his father had colon issues in his 13s but he does not know the details. He does report that he had been using ibuprofen 200 mg 2 tablets twice daily for several weeks prior to  admission for joint pains.  Patient has a history of CAD status post PCI 2 in 2011, hyperlipidemia, hypertension. He has been off Plavix for several years and has been maintained on 2 baby aspirin daily.  Past Medical History  Diagnosis Date  . Hypertension   . Reflux   . Glaucoma   . Hyperlipidemia     Past Surgical History  Procedure Laterality Date  . Cardiac stents    . Eye surgery      Prior to Admission medications   Medication Sig Start Date End Date Taking? Authorizing Provider  aspirin EC 81 MG tablet Take 162 mg by mouth daily.   Yes Historical Provider, MD  buPROPion (WELLBUTRIN XL) 300 MG 24 hr tablet Take 300 mg by mouth at bedtime. 05/10/15  Yes Historical Provider, MD  clonazePAM (KLONOPIN) 0.5 MG tablet Take 0.5 mg by mouth 2 (two) times daily. 05/10/15  Yes Historical Provider, MD  latanoprost (XALATAN) 0.005 % ophthalmic  solution Place 1 drop into both eyes at bedtime. 05/03/15  Yes Historical Provider, MD  losartan (COZAAR) 50 MG tablet Take 50 mg by mouth daily. 03/25/15  Yes Historical Provider, MD  metoprolol succinate (TOPROL-XL) 25 MG 24 hr tablet Take 25 mg by mouth daily. 05/02/15  Yes Historical Provider, MD  Omega-3 Fatty Acids (FISH OIL) 1000 MG CAPS Take 1,000 mg by mouth daily.   Yes Historical Provider, MD  promethazine (PHENERGAN) 25 MG tablet Take 25 mg by mouth every 6 (six) hours as needed for nausea or vomiting.   Yes Historical Provider, MD  rosuvastatin (CRESTOR) 20 MG tablet Take 20 mg by mouth every evening.   Yes Historical Provider, MD  sucralfate (CARAFATE) 1 G tablet Take 1 g by mouth 4 (four) times daily -  with meals and at bedtime.   Yes Historical Provider, MD  Vitamin D, Cholecalciferol, 1000 UNITS TABS Take 1,000 mg by mouth daily.   Yes Historical Provider, MD    Scheduled Meds: . antiseptic oral rinse  7 mL Mouth Rinse q12n4p  . aspirin EC  162 mg Oral Daily  . buPROPion  300 mg Oral QHS  . chlorhexidine  15 mL Mouth Rinse BID  . ciprofloxacin  400 mg Intravenous Q12H  . clonazePAM  0.5 mg Oral BID  . ipratropium-albuterol  3 mL Nebulization Once  . latanoprost  1 drop Both Eyes QHS  . metoprolol tartrate  12.5 mg Oral BID  . metroNIDAZOLE  500 mg Intravenous 4 times per day  . nitroGLYCERIN  1 inch Topical 4 times per day  . omega-3 acid ethyl esters  1 g Oral Daily  . pantoprazole (PROTONIX) IV  40 mg Intravenous BID  . rosuvastatin  20 mg Oral QPM  . sucralfate  1 g Oral TID WC & HS   Infusions: . dextrose 5 % and 0.9% NaCl 50 mL/hr at 06/15/15 1955   PRN Meds: acetaminophen, hydrALAZINE, HYDROmorphone (DILAUDID) injection, methocarbamol (ROBAXIN)  IV, metoprolol, nitroGLYCERIN, ondansetron (ZOFRAN) IV, oxyCODONE, promethazine, zolpidem   Allergies as of 06/10/2015 - Review Complete 06/10/2015  Allergen Reaction Noted  . Sulfa antibiotics Itching 06/10/2015     History reviewed. No pertinent family history.  History   Social History  . Marital Status: Divorced    Spouse Name: N/A  . Number of Children: N/A  . Years of Education: N/A   Occupational History  . Not on file.   Social History Main Topics  . Smoking status: Former Research scientist (life sciences)  . Smokeless tobacco:  Not on file  . Alcohol Use: No  . Drug Use: Not on file  . Sexual Activity: Not on file   Other Topics Concern  . Not on file   Social History Narrative  . No narrative on file    REVIEW OF SYSTEMS: Constitutional:  Denies fever, chills, night sweats ENT:  No nose bleeds Pulm:  Denies cough CV:  No palpitations, no LE edema.  GU:  No hematuria, no frequency GI:  Admits to a change in bowel habits 6 months duration. Admitted with bloody diarrhea Heme:  Denies abnormal bruising Neuro:  No headaches, no peripheral tingling or numbness Derm:  No itching, no rash or sores.  Endocrine:  No sweats or chills.  No polyuria or dysuri Travel:  None beyond local counties in last few months.    PHYSICAL EXAM: Vital signs in last 24 hours: Filed Vitals:   06/16/15 0715  BP: 148/65  Pulse: 74  Temp: 97.5 F (36.4 C)  Resp: 20   Wt Readings from Last 3 Encounters:  06/11/15 308 lb 13.8 oz (140.1 kg)    General: Obese Caucasian male with nasal cannula in place Head:  Normocephalic/atraumatic Eyes:  PERRLA, EOMI, sclerae nonicteric Ears:  No obvious external abnormalities  Nose:  Nares patent Mouth:  Moist mucosal membranes Neck:  Supple, full range of motion, no cervical adenopathy Lungs:  Lungs clear Heart: Regular rate and rhythm S1 and S2 Abdomen:  Obese, tender left lower quadrant and suprapubic area, normal bowel sounds Rectal: Rusty-colored mucus with scant stool test heme positive  Musc/Skeltl: No obvious deformity Extremities:  No C/C/E Neurologic:  Alert and oriented 3 Skin:  No obvious rash  Psych:  Normal mood and affect  Intake/Output from previous  day: 07/27 0701 - 07/28 0700 In: 1332 [I.V.:832; IV Piggyback:500] Out: -  Intake/Output this shift:    LAB RESULTS:  Recent Labs  06/14/15 0305 06/15/15 0321 06/16/15 0327  WBC 16.6* 14.5* 18.3*  HGB 12.0* 11.4* 11.8*  HCT 39.2 37.2* 37.5*  PLT 207 244 217   BMET Lab Results  Component Value Date   NA 136 06/16/2015   NA 137 06/15/2015   NA 136 06/14/2015   K 3.9 06/16/2015   K 3.9 06/15/2015   K 4.6 06/14/2015   CL 103 06/16/2015   CL 103 06/15/2015   CL 102 06/14/2015   CO2 28 06/16/2015   CO2 30 06/15/2015   CO2 30 06/14/2015   GLUCOSE 135* 06/16/2015   GLUCOSE 92 06/15/2015   GLUCOSE 113* 06/14/2015   BUN 9 06/16/2015   BUN 11 06/15/2015   BUN 12 06/14/2015   CREATININE 0.94 06/16/2015   CREATININE 1.11 06/15/2015   CREATININE 1.27* 06/14/2015   CALCIUM 7.8* 06/16/2015   CALCIUM 7.8* 06/15/2015   CALCIUM 7.9* 06/14/2015   LFT  PT/INR Lab Results  Component Value Date   INR 1.08 06/10/2015   Hepatitis Panel No results for input(s): HEPBSAG, HCVAB, HEPAIGM, HEPBIGM in the last 72 hours. C-Diff No components found for: CDIFF Lipase     Component Value Date/Time   LIPASE 14* 06/10/2015 1225     RADIOLOGY STUDIES:  EXAM: CT ABDOMEN AND PELVIS WITHOUT CONTRAST  TECHNIQUE: Multidetector CT imaging of the abdomen and pelvis was performed following the standard protocol without IV contrast.  COMPARISON: None.  FINDINGS: The transverse colon and descending colon are decompressed which limits characterization of their walls but I suspect mild thickening of the walls of the descending colon. There is  also subtle inflammatory stranding within the paracolic fat adjacent to the descending colon and trace free fluid within the lower left paracolic gutter.  Scattered diverticulosis noted within the descending and sigmoid colon, most prominent in the sigmoid colon, but no focal inflammatory change to suggest acute diverticulitis. Large  and small bowel are normal in caliber throughout.  Liver, spleen, pancreas, gallbladder, and adrenal glands are within normal limits for a noncontrast study. There are 2 nonobstructing stones in the left renal pelvis, largest measuring 6 mm. No associated hydronephrosis. 10 mm hypodense mass exophytic to the lateral cortex of the right kidney is too small to characterize but probably a small cyst. No right- sided renal stone or hydronephrosis. No ureteral or bladder calculi.  No free fluid or abscess collection. No free intraperitoneal air. Scattered atherosclerotic changes are seen along the walls of the normal- caliber abdominal aorta.  There is mild dependent atelectasis within the lower lungs bilaterally. 7 mm pulmonary nodule is also identified within the right lower lobe on axial image 5.  IMPRESSION: 1. Probable mild thickening of the walls of the descending colon suggesting a mild colitis of infectious or inflammatory nature. Associated subtle inflammatory stranding within the paracolic fat and trace free fluid in the lower left paracolic gutter. 2. Colonic diverticulosis without evidence of acute diverticulitis. 3. 7 mm pulmonary nodule within the right lower lobe, too small to definitively characterize. If the patient is at high risk for bronchogenic carcinoma, follow-up chest CT at 3-78months is recommended. If the patient is at low risk for bronchogenic carcinoma, follow-up chest CT at 6-12 months is recommended. This recommendation follows the consensus statement: Guidelines for Management of Small Pulmonary Nodules Detected on CT Scans: A Statement from the [place] as published in Radiology 2005; 237:395-400. 4. 1 cm mass exophytic to the lateral cortex of the right kidney. This is also too small to definitively characterize but most likely a benign cyst based on CT density measurements. Would consider follow-up renal ultrasound at some point to ensure  benignity. 5. Left nephrolithiasis. 6. No bowel obstruction. No abscess collection. No free intraperitoneal air. These results were called by telephone at the time of interpretation on 06/10/2015 at 4:24 pm to Dr. Joseph Berkshire , who verbally acknowledged these results.   Electronically Signed  By: Franki Cabot M.D.  On: 06/10/2015 16:25 CLINICAL DATA: Shortness of breath and chest pain for 2 days  EXAM: CT CHEST WITHOUT CONTRAST  TECHNIQUE: Multidetector CT imaging of the chest was performed following the standard protocol without IV contrast.  COMPARISON: The chest radiograph 06/10/2015  FINDINGS: Mediastinum/Nodes: Thyroid is grossly unremarkable. Small AP window lymph node measures 1 cm image 19. Trace fluid within the superior pericardial recess. Probable LAD stent. Heart size upper limits of normal. No pericardial effusion.  Lungs/Pleura: Dependent trace pleural effusions with associated compressive atelectasis. Superior segment right lower lobe pulmonary nodule measures 0.6 cm, image 30. Central airways are patent.  Upper abdomen: Normal  Musculoskeletal: No acute osseous abnormality.  IMPRESSION: Trace pleural effusions with associated compressive atelectasis.  6 mm right lower lobe pulmonary nodule. If the patient is at high risk for bronchogenic carcinoma, follow-up chest CT at 6-12 months is recommended. If the patient is at low risk for bronchogenic carcinoma, follow-up chest CT at 12 months is recommended. This recommendation follows the consensus statement: Guidelines for Management of Small Pulmonary Nodules Detected on CT Scans: A Statement from the [place] as published in Radiology 2005;237:395-400.   Electronically Signed  By: Conchita Paris M.D.  On: 06/12/2015 13:29   IMPRESSION/PLAN:  #1. Hematochezia with colitis noted on CT. The patient has a prior history of diverticular disease. Currently on cipro  and flagyl. WBC 18.3 currently. Fecal lactoferrin positive on 06/11/2015, and stool cultures from that day negative for Salmonella, Shigella, Campylobacter, Yersinia, or Escherichia coli. CT with thickening of wall of the left colon with some pericolic stranding. Will  review with attending as to possibility of colonoscopy (though prefer to avoid if possible due to recent NSTEMI) versus repeating CT of the abdomen/pelvis first. Check stool for C. diff (pt has had multiple courses of antibiotics since January).  #2. Pulmonary nodule. Patient is to have repeat chest CT in 6-12 months.  3#. NSTEMI. Cardiology has recommended anticoagulation. Heparin was discontinued overnight due to recurrent GI bleeding. Continue IV Protonix twice a day   [employer], PA-C  06/16/2015, 9:54 AM     Attending physician's note   I have taken a history, examined the patient and reviewed the chart. I agree with the Advanced Practitioner's note, impression and recommendations. NSTEMI 1 week ago. Bloody diarrhea with probable mild descending colon wall thickening on CT. R/O infectious, ischemic or IBD. Was on IV heparin which is now being held. Check for C diff. If GI symptoms persist consider repeat CT. Would like to avoid colonoscopy and sedation until at least 6 weeks post MI if possible.   Ladene Artist, MD [facility] [phone] pager Mon-Fri 8a-5p [phone] weekends, holidays and 5p-8a or per [facility]

## 2015-06-16 NOTE — Progress Notes (Signed)
[place] TEAM 1 - Stepdown/ICU TEAM Progress Note  Sergio Griffin UMP:[id number] DOB: 10-22-1951 DOA: 06/10/2015 PCP: No primary care provider on file.  Admit HPI / Brief Narrative: 64 y.o. WM PMHx HTN, HLD, CAD with coronary angiogram with stenting approximately 5-6 years ago, GERD, S/P colonoscopy~2011 showing diverticulitis (reported by patient).   Cardiologist up in [place] that he follows with yearly. He presents to the hospital with increasing abdominal pain, bloody diarrhea that started at approximately 1 this morning. He has had multiple episodes of bloody diarrhea that has progressed throughout the morning, accompanied by feeling extremely ill with left-sided, substernal chest pain, diaphoresis, cold sweats, and retching. At approximately 11 AM, the patient decided to present to the emergency room for evaluation. He has had no bloody diarrhea since that time.  With his chest pain, the patient has had left-sided, substernal chest pain doubt radiation, associated with diaphoresis, cold sweats, nausea. He had the chest pain for approximately 6-7 hours, but this has gradually trailed off since being at the hospital. He currently reports no chest pain.    HPI/Subjective: 7/28 A/O 4, states overnight has had 3 episodes of watery diarrhea with bright red blood mixed in, RN reports the amount of blood has decreased from yesterday but still present. Patient reports LLQ abdominal pain which started yesterday when he began to have blood/bloody diarrhea. Negative CP, negative SOB, negative DOE   Assessment/Plan: NSTEMI - Admitting physician discussed this patient with Dr. Tommi Rumps of (cardiology) recommended starting anticoagulation. -7/28 D/Ced heparin this a.m. 2dary to continue GI bleed overnight..   -Continue Nitropaste; currently CP free -Troponins continue to trend down.  -Continue Crestor 20 mg daily per cardiology (cardioprotective)  HTN -Continue metoprolol 12.5 mg BID -Continue  Nitropaste -Start hydralazine IV 5 mg PRN SBP> 160 or DBP> 100  Pulmonary nodule (previous smoker) -6 mm pulmonary nodule in RLL -Patient previous smoker require follow-up chest CT in 6-12 months  Infectious Colitis/ischemic colitis/Sepsis unspecified organism -7/28 WBC ticked up today -Continue Cipro and Flagyl -Stool culture negative -5W-normal saline 50 ml/hr -Maintain NPO; patient has not eaten since 7/22 -Positive BRBPR; see NSTEMI, continue Protonix IV 40 mg BID -7/28 considering repeat CT abdomen pelvis however consulted GI will await their recommendations.  UTI -Urine culture negative -Typical organisms would be covered by current antibiotics  Acute on Chronic kidney failure stage II (no previous Cr for baseline) -Continue hydration -Cr normalized -Hold all nephrotoxic medication  Hyperkalemia -At goal      Code Status: FULL Family Communication: family present at time of exam Disposition Plan: Per cardiology    Consultants: Dr.Daniel Tommi Rumps (cardiology)    Procedure/Significant Events: 7/22 CT abdomen pelvis without contrast;-Probable mild thickening walls of descending colon mild colitis   infectious vs inflammatory nature. -Associated subtle inflammatory stranding W/I paracolic fat and trace free fluid in the lower left paracolic gutter. -7 mm pulmonary nodule within the RLL  -1 cm mass exophytic to the lateral cortex of the right kidney; most likely benign cyst -Left nephrolithiasis. 7/24 Echocardiogram; - Left ventricle: moderateLVH.-LVEF= 60%.  7/24 CT chest without contrast;-pleural effusions with associated compressive atelectasis. -6 mm RLL pulmonary nodule.   Culture 7/22 urine negative  7/23 blood left/right hand NGTD 7/23 MRSA by PCR negative 7/23 stool Negative   Antibiotics: Ciprofloxacin 7/22>> Flagyl 7/22>>  DVT prophylaxis: Heparin drip   Devices    LINES / TUBES:      Continuous Infusions: . dextrose 5 % and 0.9%  NaCl 50 mL/hr at  06/15/15 1955    Objective: VITAL SIGNS: Temp: 97.5 F (36.4 C) (07/28 0715) Temp Source: Oral (07/28 0715) BP: 148/65 mmHg (07/28 0715) Pulse Rate: 74 (07/28 0715) SPO2; FIO2:   Intake/Output Summary (Last 24 hours) at 06/16/15 1013 Last data filed at 06/16/15 0600  Gross per 24 hour  Intake   1132 ml  Output      0 ml  Net   1132 ml     Exam: General:  A/O 4, NAD, No acute respiratory distress Eyes: Negative headache, eye pain, double vision, negative scleral hemorrhage ENT: Negative Runny nose, negative ear pain, negative tinnitus, negative gingival bleeding Neck:  Negative scars, masses, torticollis, lymphadenopathy, JVD Lungs: Clear to auscultation bilaterally without wheezes or crackles Cardiovascular: Regular rate and rhythm without murmur gallop or rub normal S1 and S2 Abdomen:  Morbidly obese, positive diffuse abdominal pain greatest in the LLQ, could not appreciate bowel sounds this morning, no rebound, no ascites, no appreciable mass Extremities: No significant cyanosis, clubbing, or edema bilateral lower extremities Psychiatric:  Negative depression, negative anxiety, negative fatigue, negative mania  Neurologic:  Cranial nerves II through XII intact, tongue/uvula midline, all extremities muscle strength 5/5, sensation intact throughout,  negative dysarthria, negative expressive aphasia, negative receptive aphasia.     Data Reviewed: Basic Metabolic Panel:  Recent Labs Lab 06/11/15 1200 06/13/15 0240 06/14/15 0305 06/15/15 0321 06/16/15 0327  NA 132* 137 136 137 136  K 5.9* 4.6 4.6 3.9 3.9  CL 103 103 102 103 103  CO2 16* 28 30 30 28   GLUCOSE 135* 110* 113* 92 135*  BUN 31* 11 12 11 9   CREATININE 1.63* 1.26* 1.27* 1.11 0.94  CALCIUM 8.2* 7.9* 7.9* 7.8* 7.8*  MG 2.6* 2.2 2.2 2.1 2.2   Liver Function Tests:  Recent Labs Lab 06/10/15 1225 06/11/15 1200  AST 25 32  ALT 24 21  ALKPHOS 49 59  BILITOT 0.8 1.4*  PROT 7.0 6.2*    ALBUMIN 4.1 3.4*    Recent Labs Lab 06/10/15 1225  LIPASE 14*   No results for input(s): AMMONIA in the last 168 hours. CBC:  Recent Labs Lab 06/10/15 1225  06/12/15 0255 06/13/15 0240 06/14/15 0305 06/15/15 0321 06/16/15 0327  WBC 19.4*  < > 20.9* 19.5* 16.6* 14.5* 18.3*  NEUTROABS 17.3*  --   --   --   --   --   --   HGB 16.5  < > 13.0 12.0* 12.0* 11.4* 11.8*  HCT 50.5  < > 41.9 39.4 39.2 37.2* 37.5*  MCV 87.8  < > 89.9 91.4 92.0 91.2 89.7  PLT 268  < > 229 217 207 244 217  < > = values in this interval not displayed. Cardiac Enzymes:  Recent Labs Lab 06/14/15 1939 06/15/15 0321 06/15/15 1321 06/15/15 1920 06/16/15 0327  TROPONINI 0.10* 0.08* 0.09* 0.06* 0.05*   BNP (last 3 results)  Recent Labs  06/10/15 1225  BNP 16.0    ProBNP (last 3 results) No results for input(s): PROBNP in the last 8760 hours.  CBG: No results for input(s): GLUCAP in the last 168 hours.  Recent Results (from the past 240 hour(s))  Urine culture     Status: None   Collection Time: 06/10/15  1:58 PM  Result Value Ref Range Status   Specimen Description URINE, CLEAN CATCH  Final   Special Requests NONE  Final   Culture   Final    4,000 COLONIES/mL INSIGNIFICANT GROWTH Performed at [facility]  Report Status 06/14/2015 FINAL  Final  MRSA PCR Screening     Status: None   Collection Time: 06/11/15  1:55 AM  Result Value Ref Range Status   MRSA by PCR NEGATIVE NEGATIVE Final    Comment:        The GeneXpert MRSA Assay (FDA approved for NASAL specimens only), is one component of a comprehensive MRSA colonization surveillance program. It is not intended to diagnose MRSA infection nor to guide or monitor treatment for MRSA infections.   Stool culture     Status: None   Collection Time: 06/11/15  9:00 PM  Result Value Ref Range Status   Specimen Description STOOL  Final   Special Requests NONE  Final   Culture   Final    NO SALMONELLA, SHIGELLA,  CAMPYLOBACTER, YERSINIA, OR E.COLI 0157:[id number] ISOLATED Note: REDUCED NORMAL FLORA PRESENT Performed at [facility]    Report Status 06/15/2015 FINAL  Final  Culture, blood (routine x 2)     Status: None (Preliminary result)   Collection Time: 06/11/15  9:50 PM  Result Value Ref Range Status   Specimen Description BLOOD LEFT HAND  Final   Special Requests IN PEDIATRIC BOTTLE 2CC  Final   Culture NO GROWTH 4 DAYS  Final   Report Status PENDING  Incomplete  Culture, blood (routine x 2)     Status: None (Preliminary result)   Collection Time: 06/11/15  9:56 PM  Result Value Ref Range Status   Specimen Description BLOOD RIGHT HAND  Final   Special Requests IN PEDIATRIC BOTTLE 2CC  Final   Culture NO GROWTH 4 DAYS  Final   Report Status PENDING  Incomplete     Studies:  Recent x-ray studies have been reviewed in detail by the Attending Physician  Scheduled Meds:  Scheduled Meds: . antiseptic oral rinse  7 mL Mouth Rinse q12n4p  . aspirin EC  162 mg Oral Daily  . buPROPion  300 mg Oral QHS  . chlorhexidine  15 mL Mouth Rinse BID  . ciprofloxacin  400 mg Intravenous Q12H  . clonazePAM  0.5 mg Oral BID  . ipratropium-albuterol  3 mL Nebulization Once  . latanoprost  1 drop Both Eyes QHS  . metoprolol tartrate  12.5 mg Oral BID  . metroNIDAZOLE  500 mg Intravenous 4 times per day  . nitroGLYCERIN  1 inch Topical 4 times per day  . omega-3 acid ethyl esters  1 g Oral Daily  . pantoprazole (PROTONIX) IV  40 mg Intravenous BID  . rosuvastatin  20 mg Oral QPM  . sucralfate  1 g Oral TID WC & HS    Time spent on care of this patient: 40 mins   WOODS, Geraldo Docker , MD  [facility] Office  [phone] Pager - [phone]  On-Call/Text Page:      [email or web address]      password [email or web address]  If 7PM-7AM, please contact night-coverage [email or web address] Password [email or web address] 06/16/2015, 10:13 AM   LOS: 6 days   Care during the described time interval was provided by me .  I have reviewed  this patient's available data, including medical history, events of note, physical examination, and all test results as part of my evaluation. I have personally reviewed and interpreted all radiology studies.   Dia Crawford, MD [phone] Pager

## 2015-06-17 ENCOUNTER — Inpatient Hospital Stay (HOSPITAL_COMMUNITY): Payer: Medicare Other

## 2015-06-17 ENCOUNTER — Encounter (HOSPITAL_COMMUNITY): Payer: Self-pay

## 2015-06-17 DIAGNOSIS — R933 Abnormal findings on diagnostic imaging of other parts of digestive tract: Secondary | ICD-10-CM | POA: Diagnosis present

## 2015-06-17 DIAGNOSIS — J810 Acute pulmonary edema: Secondary | ICD-10-CM | POA: Diagnosis present

## 2015-06-17 DIAGNOSIS — R7989 Other specified abnormal findings of blood chemistry: Secondary | ICD-10-CM

## 2015-06-17 DIAGNOSIS — E876 Hypokalemia: Secondary | ICD-10-CM | POA: Diagnosis present

## 2015-06-17 DIAGNOSIS — K559 Vascular disorder of intestine, unspecified: Secondary | ICD-10-CM

## 2015-06-17 DIAGNOSIS — D62 Acute posthemorrhagic anemia: Secondary | ICD-10-CM | POA: Diagnosis present

## 2015-06-17 DIAGNOSIS — K921 Melena: Secondary | ICD-10-CM | POA: Diagnosis present

## 2015-06-17 LAB — CBC
HEMATOCRIT: 31.6 % — AB (ref 39.0–52.0)
Hemoglobin: 10.1 g/dL — ABNORMAL LOW (ref 13.0–17.0)
MCH: 28.5 pg (ref 26.0–34.0)
MCHC: 32 g/dL (ref 30.0–36.0)
MCV: 89 fL (ref 78.0–100.0)
Platelets: 240 10*3/uL (ref 150–400)
RBC: 3.55 MIL/uL — ABNORMAL LOW (ref 4.22–5.81)
RDW: 14.3 % (ref 11.5–15.5)
WBC: 18.8 10*3/uL — ABNORMAL HIGH (ref 4.0–10.5)

## 2015-06-17 LAB — BLOOD GAS, ARTERIAL
Acid-Base Excess: 4.1 mmol/L — ABNORMAL HIGH (ref 0.0–2.0)
Bicarbonate: 28.6 mEq/L — ABNORMAL HIGH (ref 20.0–24.0)
Drawn by: 275531
O2 Content: 3 L/min
O2 Saturation: 95.4 %
Patient temperature: 98.6
TCO2: 30 mmol/L (ref 0–100)
pCO2 arterial: 46.7 mmHg — ABNORMAL HIGH (ref 35.0–45.0)
pH, Arterial: 7.404 (ref 7.350–7.450)
pO2, Arterial: 71 mmHg — ABNORMAL LOW (ref 80.0–100.0)

## 2015-06-17 LAB — BASIC METABOLIC PANEL
Anion gap: 4 — ABNORMAL LOW (ref 5–15)
BUN: 6 mg/dL (ref 6–20)
CO2: 30 mmol/L (ref 22–32)
Calcium: 7.7 mg/dL — ABNORMAL LOW (ref 8.9–10.3)
Chloride: 103 mmol/L (ref 101–111)
Creatinine, Ser: 1.02 mg/dL (ref 0.61–1.24)
Glucose, Bld: 132 mg/dL — ABNORMAL HIGH (ref 65–99)
Potassium: 3.4 mmol/L — ABNORMAL LOW (ref 3.5–5.1)
Sodium: 137 mmol/L (ref 135–145)

## 2015-06-17 LAB — TROPONIN I
TROPONIN I: 0.06 ng/mL — AB (ref <0.031)
TROPONIN I: 0.03 ng/mL (ref <0.031)
Troponin I: 0.04 ng/mL — ABNORMAL HIGH (ref <0.031)

## 2015-06-17 LAB — MAGNESIUM: Magnesium: 2.3 mg/dL (ref 1.7–2.4)

## 2015-06-17 MED ORDER — FUROSEMIDE 10 MG/ML IJ SOLN
80.0000 mg | Freq: Once | INTRAMUSCULAR | Status: AC
  Administered 2015-06-17: 80 mg via INTRAVENOUS
  Filled 2015-06-17: qty 8

## 2015-06-17 MED ORDER — PANTOPRAZOLE SODIUM 40 MG PO TBEC
40.0000 mg | DELAYED_RELEASE_TABLET | Freq: Every day | ORAL | Status: DC
  Administered 2015-06-17: 40 mg via ORAL

## 2015-06-17 MED ORDER — IOHEXOL 350 MG/ML SOLN
100.0000 mL | Freq: Once | INTRAVENOUS | Status: AC | PRN
  Administered 2015-06-17: 100 mL via INTRAVENOUS

## 2015-06-17 MED ORDER — FUROSEMIDE 10 MG/ML IJ SOLN: 40.0000 mg | Freq: Once | INTRAMUSCULAR | Status: DC

## 2015-06-17 MED ORDER — POTASSIUM CHLORIDE 10 MEQ/100ML IV SOLN: 10.0000 meq | INTRAVENOUS | Status: DC

## 2015-06-17 MED ORDER — PANTOPRAZOLE SODIUM 40 MG IV SOLR: 40.0000 mg | INTRAVENOUS | Status: DC

## 2015-06-17 MED ORDER — POTASSIUM CHLORIDE 10 MEQ/100ML IV SOLN
10.0000 meq | INTRAVENOUS | Status: AC
  Administered 2015-06-17 (×2): 10 meq via INTRAVENOUS
  Filled 2015-06-17 (×2): qty 100

## 2015-06-17 MED ORDER — PANTOPRAZOLE SODIUM 40 MG PO TBEC
40.0000 mg | DELAYED_RELEASE_TABLET | Freq: Two times a day (BID) | ORAL | Status: DC
  Administered 2015-06-17 – 2015-06-22 (×10): 40 mg via ORAL
  Filled 2015-06-17 (×10): qty 1

## 2015-06-17 MED ORDER — POTASSIUM CHLORIDE 10 MEQ/100ML IV SOLN
10.0000 meq | INTRAVENOUS | Status: AC
Start: 2015-06-17 — End: 2015-06-17
  Administered 2015-06-17 (×2): 10 meq via INTRAVENOUS
  Filled 2015-06-17 (×2): qty 100

## 2015-06-17 NOTE — Progress Notes (Signed)
Pt returned to bed from chair with assistance from a visitor in the room without calling nursing staff for assistance. Pt's arm was bleeding and IV was pulled out when I entered room. Educated pt and visitor on safety and importance of calling nursing staff for assistance to transfer.

## 2015-06-17 NOTE — Progress Notes (Signed)
Daily Rounding Note  06/17/2015, 8:41 AM  LOS: 7 days   SUBJECTIVE:       No BM yesterday or today.  Less severe pain, now localized to LLQ.  No appetite but not nauseated  OBJECTIVE:         Vital signs in last 24 hours:    Temp:  [97.7 F (36.5 C)-98.3 F (36.8 C)] 97.7 F (36.5 C) (07/29 0407) Pulse Rate:  [65-83] 79 (07/29 0407) Resp:  [19-24] 19 (07/29 0407) BP: (121-136)/(46-59) 126/46 mmHg (07/29 0407) SpO2:  [91 %-96 %] 94 % (07/29 0407) Last BM Date: 06/15/15 Filed Weights   06/10/15 1152 06/10/15 1810 06/11/15 0123  Weight: 312 lb (141.522 kg) 312 lb 9.6 oz (141.794 kg) 308 lb 13.8 oz (140.1 kg)   General: obese, looks ill.  alert   Heart: RRR Chest:  Dyspneic with speaking.  No cough.  Abdomen: obese, distended, mild, focal tenderness in LLQ.  BS present but hypoactive  Extremities: no CCE Neuro/Psych:  Oriented x 3.  Slow but accurate verbal responses to questions.  Moves all 4s.   Intake/Output from previous day: 07/28 0701 - 07/29 0700 In: 1150 [I.V.:850; IV Piggyback:300] Out: 2000 [Urine:2000]  Intake/Output this shift:    Lab Results:  Recent Labs  06/15/15 0321 06/16/15 0327 06/17/15 0310  WBC 14.5* 18.3* 18.8*  HGB 11.4* 11.8* 10.1*  HCT 37.2* 37.5* 31.6*  PLT 244 217 240   BMET  Recent Labs  06/15/15 0321 06/16/15 0327 06/17/15 0310  NA 137 136 137  K 3.9 3.9 3.4*  CL 103 103 103  CO2 30 28 30   GLUCOSE 92 135* 132*  BUN 11 9 6   CREATININE 1.11 0.94 1.02  CALCIUM 7.8* 7.8* 7.7*   LFT No results for input(s): PROT, ALBUMIN, AST, ALT, ALKPHOS, BILITOT, BILIDIR, IBILI in the last 72 hours. PT/INR No results for input(s): LABPROT, INR in the last 72 hours. Hepatitis Panel No results for input(s): HEPBSAG, HCVAB, HEPAIGM, HEPBIGM in the last 72 hours.  Studies/Results: No results found.   Scheduled Meds: . antiseptic oral rinse  7 mL Mouth Rinse q12n4p  . aspirin  EC  162 mg Oral Daily  . buPROPion  300 mg Oral QHS  . chlorhexidine  15 mL Mouth Rinse BID  . ciprofloxacin  400 mg Intravenous Q12H  . clonazePAM  0.5 mg Oral BID  . ipratropium-albuterol  3 mL Nebulization Once  . latanoprost  1 drop Both Eyes QHS  . metoprolol tartrate  12.5 mg Oral BID  . metroNIDAZOLE  500 mg Intravenous 4 times per day  . nitroGLYCERIN  1 inch Topical 4 times per day  . omega-3 acid ethyl esters  1 g Oral Daily  . pantoprazole (PROTONIX) IV  40 mg Intravenous BID  . rosuvastatin  20 mg Oral QPM  . sucralfate  1 g Oral TID WC & HS   Continuous Infusions: . dextrose 5 % and 0.9% NaCl 50 mL/hr at 06/16/15 2219   PRN Meds:.acetaminophen, hydrALAZINE, HYDROmorphone (DILAUDID) injection, methocarbamol (ROBAXIN)  IV, metoprolol, nitroGLYCERIN, ondansetron (ZOFRAN) IV, oxyCODONE, promethazine, zolpidem   ASSESMENT:   *  Hematochezia with left abdominal pain, began 7/21.  Colitis on CT.  Rule out infectious vs ischemic vs IBS. Hx diverticular disease.  Cipro/flagyl in place. Fecal lactoferrin positive, stool cultures negative. C diff ordered (several courses abx over past year) but not collected Prefer to avoid colonoscopy until 6 weeks post MI  WBCs remain elevated.  Clinically improving.   *   Pulmonary nodule. Plan repeat CT in 6-12 months.  Also TSTC right kidney mass, radiologist favors benign cyst.  *   NSTEMI. Cardiology has recommended anticoagulation. Heparin was discontinued due to GI bleeding. Previous cardiac stenting 2011. No cardiac cath as of yet. Moderate LVH and EF 60% on echo.   *  Hx gastric ulcer 1998, diagnosed at EGD in [place].  Moderate doses Ibuprofen (400 mg daily) PTA.  Have reduced PPI to once daily and stopped Carafate.   *  Normocytic anemia. Not unexpected in this situation.    PLAN   *  Daily PPI, I stopped Carafate.   *  Allow clears.      Azucena Freed  06/17/2015, 8:41 AM Pager: [phone]    Attending physician's note    I have taken an interval history, reviewed the chart and examined the patient. I agree with the Advanced Practitioner's note, impression and recommendations. Had rectal bleeding earlier today and patient is dyspneic with speaking and any movement.   Abd/pelvic CTA IMPRESSION: 1. Mild aortoiliac plaque without significant occlusive disease. 2. No significant proximal mesenteric arterial or venous occlusive disease to suggest etiology of mesenteric ischemia. 3. Progressive changes of colitis involving the distal transverse and descending segments, with new abdominal ascites. No perforation or abscess. 4. Sigmoid diverticulosis. 5. New small pleural effusions. 6. Left nephrolithiasis without hydronephrosis.  Segmental colitis, presumed ischemic, involving distal transverse to descending colon. Agree with holding anticoagulants if possible. Continue clear liquid diet.  Worsening dyspnea, etiology unclear, primary service to evaluate.    Pricilla Riffle. Fuller Plan, MD [facility] [phone] pager Mon-Fri 8a-5p [phone] weekends, holidays and 5p-8a or per [facility]

## 2015-06-17 NOTE — Progress Notes (Signed)
SUBJECTIVE:  The patient continues to have ongoing GI problems that are being treated in the hospital. His cardiac status remained stable in the hospital.   Filed Vitals:   06/16/15 2336 06/17/15 0407 06/17/15 0700 06/17/15 0937  BP: 136/54 126/46  155/69  Pulse: 83 79  84  Temp: 97.8 F (36.6 C) 97.7 F (36.5 C) 98.2 F (36.8 C)   TempSrc: Oral Oral Oral   Resp: 24 19    Height:      Weight:      SpO2: 91% 94%       Intake/Output Summary (Last 24 hours) at 06/17/15 1016 Last data filed at 06/17/15 0600  Gross per 24 hour  Intake   1000 ml  Output   1500 ml  Net   -500 ml    LABS: Basic Metabolic Panel:  Recent Labs  06/16/15 0327 06/17/15 0310  NA 136 137  K 3.9 3.4*  CL 103 103  CO2 28 30  GLUCOSE 135* 132*  BUN 9 6  CREATININE 0.94 1.02  CALCIUM 7.8* 7.7*  MG 2.2 2.3   Liver Function Tests: No results for input(s): AST, ALT, ALKPHOS, BILITOT, PROT, ALBUMIN in the last 72 hours. No results for input(s): LIPASE, AMYLASE in the last 72 hours. CBC:  Recent Labs  06/16/15 0327 06/17/15 0310  WBC 18.3* 18.8*  HGB 11.8* 10.1*  HCT 37.5* 31.6*  MCV 89.7 89.0  PLT 217 240   Cardiac Enzymes:  Recent Labs  06/16/15 1127 06/16/15 1938 06/17/15 0310  TROPONINI 0.05* 0.06* 0.06*   BNP: Invalid input(s): POCBNP D-Dimer: No results for input(s): DDIMER in the last 72 hours. Hemoglobin A1C: No results for input(s): HGBA1C in the last 72 hours. Fasting Lipid Panel: No results for input(s): CHOL, HDL, LDLCALC, TRIG, CHOLHDL, LDLDIRECT in the last 72 hours. Thyroid Function Tests: No results for input(s): TSH, T4TOTAL, T3FREE, THYROIDAB in the last 72 hours.  Invalid input(s): FREET3  RADIOLOGY: Ct Abdomen Pelvis Wo Contrast  06/10/2015   CLINICAL DATA:  Abdominal pain, constipation. Bright red blood in stool.  EXAM: CT ABDOMEN AND PELVIS WITHOUT CONTRAST  TECHNIQUE: Multidetector CT imaging of the abdomen and pelvis was performed following the  standard protocol without IV contrast.  COMPARISON:  None.  FINDINGS: The transverse colon and descending colon are decompressed which limits characterization of their walls but I suspect mild thickening of the walls of the descending colon. There is also subtle inflammatory stranding within the paracolic fat adjacent to the descending colon and trace free fluid within the lower left paracolic gutter.  Scattered diverticulosis noted within the descending and sigmoid colon, most prominent in the sigmoid colon, but no focal inflammatory change to suggest acute diverticulitis. Large and small bowel are normal in caliber throughout.  Liver, spleen, pancreas, gallbladder, and adrenal glands are within normal limits for a noncontrast study. There are 2 nonobstructing stones in the left renal pelvis, largest measuring 6 mm. No associated hydronephrosis. 10 mm hypodense mass exophytic to the lateral cortex of the right kidney is too small to characterize but probably a small cyst. No right- sided renal stone or hydronephrosis. No ureteral or bladder calculi.  No free fluid or abscess collection. No free intraperitoneal air. Scattered atherosclerotic changes are seen along the walls of the normal- caliber abdominal aorta.  There is mild dependent atelectasis within the lower lungs bilaterally. 7 mm pulmonary nodule is also identified within the right lower lobe on axial image 5.  IMPRESSION: 1.  Probable mild thickening of the walls of the descending colon suggesting a mild colitis of infectious or inflammatory nature. Associated subtle inflammatory stranding within the paracolic fat and trace free fluid in the lower left paracolic gutter. 2. Colonic diverticulosis without evidence of acute diverticulitis. 3. 7 mm pulmonary nodule within the right lower lobe, too small to definitively characterize. If the patient is at high risk for bronchogenic carcinoma, follow-up chest CT at 3-72months is recommended. If the patient is at  low risk for bronchogenic carcinoma, follow-up chest CT at 6-12 months is recommended. This recommendation follows the consensus statement: Guidelines for Management of Small Pulmonary Nodules Detected on CT Scans: A Statement from the [place] as published in Radiology 2005; 237:395-400. 4. 1 cm mass exophytic to the lateral cortex of the right kidney. This is also too small to definitively characterize but most likely a benign cyst based on CT density measurements. Would consider follow-up renal ultrasound at some point to ensure benignity. 5. Left nephrolithiasis. 6. No bowel obstruction. No abscess collection. No free intraperitoneal air. These results were called by telephone at the time of interpretation on 06/10/2015 at 4:24 pm to Dr. Joseph Berkshire , who verbally acknowledged these results.   Electronically Signed   By: Franki Cabot M.D.   On: 06/10/2015 16:25   Ct Chest Wo Contrast  06/12/2015   CLINICAL DATA:  Shortness of breath and chest pain for 2 days  EXAM: CT CHEST WITHOUT CONTRAST  TECHNIQUE: Multidetector CT imaging of the chest was performed following the standard protocol without IV contrast.  COMPARISON:  The chest radiograph 06/10/2015  FINDINGS: Mediastinum/Nodes: Thyroid is grossly unremarkable. Small AP window lymph node measures 1 cm image 19. Trace fluid within the superior pericardial recess. Probable LAD stent. Heart size upper limits of normal. No pericardial effusion.  Lungs/Pleura: Dependent trace pleural effusions with associated compressive atelectasis. Superior segment right lower lobe pulmonary nodule measures 0.6 cm, image 30. Central airways are patent.  Upper abdomen: Normal  Musculoskeletal: No acute osseous abnormality.  IMPRESSION: Trace pleural effusions with associated compressive atelectasis.  6 mm right lower lobe pulmonary nodule. If the patient is at high risk for bronchogenic carcinoma, follow-up chest CT at 6-12 months is recommended. If the patient  is at low risk for bronchogenic carcinoma, follow-up chest CT at 12 months is recommended. This recommendation follows the consensus statement: Guidelines for Management of Small Pulmonary Nodules Detected on CT Scans: A Statement from the [employer] as published in Radiology 2005;237:395-400.   Electronically Signed   By: Conchita Paris M.D.   On: 06/12/2015 13:29   Dg Abd Acute W/chest  06/10/2015   CLINICAL DATA:  Sudden onset of severe abdominal pain at 0300 hours, abdominal distension, history hypertension, diverticulitis, former smoker  EXAM: DG ABDOMEN ACUTE W/ 1V CHEST  COMPARISON:  None  FINDINGS: Enlargement of cardiac silhouette.  Mediastinal contours and pulmonary vascularity normal.  Lungs clear.  No pleural effusion or pneumothorax.  Mild gaeous distention of stomach.  Question wall thickening of duodenum.  Nonobstructive bowel gas pattern otherwise seen.  No bowel dilatation or free air.  No urinary tract calcification or bone abnormality.  IMPRESSION: Enlargement of cardiac silhouette.  Mild gaseous distention of stomach with questionable wall thickening of the descending duodenum.  Otherwise normal bowel gas pattern.   Electronically Signed   By: Lavonia Dana M.D.   On: 06/10/2015 13:23     ASSESSMENT AND PLAN:    NSTEMI (non-ST elevated myocardial infarction)  The patient had troponin elevation with admission. This was felt to be demand ischemia from the severity of his other medical problems on presentation. Left ventricular function is normal by echo. Cardiology does not need to see him on a daily basis. Please contact cardiology when the patient is getting close to discharge. We can reassess at that time and decide if any further in-hospital cardiac workup is warranted. Most likely, he can be followed as an outpatient by his cardiologist.    CAD (coronary artery disease), native coronary artery     He has known coronary disease that has been clinically stable.     Wide-complex tachycardia     On one occasion early during this admission he had 7 beats of wide complex tachycardia. There has been no recurrence. No further workup is needed.   Dola Argyle 06/17/2015 10:16 AM

## 2015-06-17 NOTE — Progress Notes (Signed)
UR COMPLETED  

## 2015-06-17 NOTE — Progress Notes (Signed)
[place] TEAM 1 - Stepdown/ICU TEAM Progress Note  Sergio Griffin:[id number] DOB: 09-Oct-1951 DOA: 06/10/2015 PCP: No primary care provider on file.  Admit HPI / Brief Narrative: 64 y.o. WM PMHx HTN, HLD, CAD with coronary angiogram with stenting approximately 5-6 years ago, GERD, S/P colonoscopy~2011 showing diverticulitis (reported by patient).   Cardiologist up in [place] that he follows with yearly. He presents to the hospital with increasing abdominal pain, bloody diarrhea that started at approximately 1 this morning. He has had multiple episodes of bloody diarrhea that has progressed throughout the morning, accompanied by feeling extremely ill with left-sided, substernal chest pain, diaphoresis, cold sweats, and retching. At approximately 11 AM, the patient decided to present to the emergency room for evaluation. He has had no bloody diarrhea since that time.  With his chest pain, the patient has had left-sided, substernal chest pain doubt radiation, associated with diaphoresis, cold sweats, nausea. He had the chest pain for approximately 6-7 hours, but this has gradually trailed off since being at the hospital. He currently reports no chest pain.    HPI/Subjective: 7/29 A/O 4, Witnessed moderate Frank amount of [employer] Blood per rectum. Not on Anticoagulant  Patient reports continued LLQ/RLQ  abdominal pain not as bad as yesterday(when imobile rated 1/10) exacerbated by sitting/standing. Negative CP, Positive SOB,  Positive DOE      Assessment/Plan: NSTEMI - Admitting physician discussed this patient with Dr. Tommi Rumps of (cardiology) recommended starting anticoagulation. -7/28 D/Ced heparin this a.m. 2dary to continue GI bleed overnight..   -Continue Nitropaste; currently CP free -Troponins continue to trend down.  -Continue Crestor 20 mg daily per cardiology (cardioprotective) -Strict I&O; since admission + 10.1 L  Increasing DOE -EKG compared to EKG from 7/22 no  significant change  HTN -Continue metoprolol 12.5 mg BID -Continue Nitropaste -Start hydralazine IV 5 mg PRN SBP> 160 or DBP> 100  Pulmonary nodule (previous smoker) -6 mm pulmonary nodule in RLL -Patient previous smoker require follow-up chest CT in 6-12 months  Pulmonary edema -Lasix 80 mg 1  Infectious Colitis/ischemic colitis/Sepsis unspecified organism -7/28 WBC ticked up today -Continue Cipro and Flagyl -Stool culture negative -5W-normal saline 50 ml/hr -Maintain NPO; patient has not eaten since 7/22 -Positive BRBPR; see NSTEMI, continue Protonix IV 40 mg BID -7/29 Repeat CT abdomen pelvis; showing worsening colitis;  spoke with Dr.Malcolm T Fuller Plan (GI), and he feels we may have to reluctantly perform colonoscopy, but would like patient to be more stable. -Consult ID in the a.m?  UTI -Urine culture negative -Typical organisms would be covered by current antibiotics  Acute on Chronic kidney failure stage II (no previous Cr for baseline) -Continue hydration -Cr normalized -Hold all nephrotoxic medication  Hypokalemia -Potassium goal> 4 -Potassium IV 4 gm  Acute blood loss anemia -Patient continues to bleed has dropped one unit of blood overnight    Code Status: FULL Family Communication: None present at time of exam Disposition Plan: Per cardiology    Consultants: Dr.Daniel Tommi Rumps (cardiology)  Dr.Malcolm Sindy [place] (GI)    Procedure/Significant Events: 7/22 CT abdomen pelvis without contrast;-Probable mild thickening walls of descending colon mild colitis   infectious vs inflammatory nature. -Associated subtle inflammatory stranding W/I paracolic fat and trace free fluid in the lower left paracolic gutter. -7 mm pulmonary nodule within the RLL  -1 cm mass exophytic to the lateral cortex of the right kidney; most likely benign cyst -Left nephrolithiasis. 7/24 Echocardiogram; - Left ventricle: moderateLVH.-LVEF= 60%.  7/24 CT chest without  contrast;-pleural effusions with associated compressive atelectasis. -6 mm RLL pulmonary nodule. 7/29 CT angiogram abdomen pelvis;-Mild aortoiliac plaque without significant occlusive disease. - No significant proximal mesenteric arterial or venous occlusive Dz to suggest etiology of mesenteric ischemia. - Progressive changes of colitis involving the distal transverse and descending segments, with new abdominal ascites. No perforation or abscess. - Sigmoid diverticulosis. -Left nephrolithiasis without hydronephrosis. 7/29 PCXR; shows mild pulmonary congestion not significantly increased    Culture 7/22 urine negative  7/23 blood left/right hand NGTD 7/23 MRSA by PCR negative 7/23 stool Negative   Antibiotics: Ciprofloxacin 7/22>> Flagyl 7/22>>  DVT prophylaxis: Heparin drip   Devices    LINES / TUBES:      Continuous Infusions: . dextrose 5 % and 0.9% NaCl 50 mL/hr at 06/16/15 2219    Objective: VITAL SIGNS: Temp: 98.2 F (36.8 C) (07/29 0700) Temp Source: Oral (07/29 0700) BP: 126/46 mmHg (07/29 0407) Pulse Rate: 79 (07/29 0407) SPO2; FIO2:   Intake/Output Summary (Last 24 hours) at 06/17/15 6546 Last data filed at 06/17/15 0600  Gross per 24 hour  Intake   1050 ml  Output   1500 ml  Net   -450 ml     Exam: General:  A/O 4, NAD, positive increasing respiratory distress Eyes: Negative headache, eye pain, double vision, negative scleral hemorrhage ENT: Negative Runny nose, negative ear pain, negative tinnitus, negative gingival bleeding Neck:  Negative scars, masses, torticollis, lymphadenopathy, JVD Lungs: Clear to auscultation bilaterally without wheezes or crackles Cardiovascular: Regular rate and rhythm without murmur gallop or rub normal S1 and S2 Abdomen:  Morbidly obese, positive diffuse abdominal pain greatest in the LLQ, could not appreciate bowel sounds this morning, no rebound, no ascites, no appreciable mass Extremities: No significant  cyanosis, clubbing, or edema bilateral lower extremities Psychiatric:  Negative depression, negative anxiety, negative fatigue, negative mania  Neurologic:  Cranial nerves II through XII intact, tongue/uvula midline, all extremities muscle strength 5/5, sensation intact throughout,  negative dysarthria, negative expressive aphasia, negative receptive aphasia.     Data Reviewed: Basic Metabolic Panel:  Recent Labs Lab 06/13/15 0240 06/14/15 0305 06/15/15 0321 06/16/15 0327 06/17/15 0310  NA 137 136 137 136 137  K 4.6 4.6 3.9 3.9 3.4*  CL 103 102 103 103 103  CO2 28 30 30 28 30   GLUCOSE 110* 113* 92 135* 132*  BUN 11 12 11 9 6   CREATININE 1.26* 1.27* 1.11 0.94 1.02  CALCIUM 7.9* 7.9* 7.8* 7.8* 7.7*  MG 2.2 2.2 2.1 2.2 2.3   Liver Function Tests:  Recent Labs Lab 06/10/15 1225 06/11/15 1200  AST 25 32  ALT 24 21  ALKPHOS 49 59  BILITOT 0.8 1.4*  PROT 7.0 6.2*  ALBUMIN 4.1 3.4*    Recent Labs Lab 06/10/15 1225  LIPASE 14*   No results for input(s): AMMONIA in the last 168 hours. CBC:  Recent Labs Lab 06/10/15 1225  06/13/15 0240 06/14/15 0305 06/15/15 0321 06/16/15 0327 06/17/15 0310  WBC 19.4*  < > 19.5* 16.6* 14.5* 18.3* 18.8*  NEUTROABS 17.3*  --   --   --   --   --   --   HGB 16.5  < > 12.0* 12.0* 11.4* 11.8* 10.1*  HCT 50.5  < > 39.4 39.2 37.2* 37.5* 31.6*  MCV 87.8  < > 91.4 92.0 91.2 89.7 89.0  PLT 268  < > 217 207 244 217 240  < > = values in this interval not displayed. Cardiac Enzymes:  Recent  Labs Lab 06/15/15 1920 06/16/15 0327 06/16/15 1127 06/16/15 1938 06/17/15 0310  TROPONINI 0.06* 0.05* 0.05* 0.06* 0.06*   BNP (last 3 results)  Recent Labs  06/10/15 1225  BNP 16.0    ProBNP (last 3 results) No results for input(s): PROBNP in the last 8760 hours.  CBG: No results for input(s): GLUCAP in the last 168 hours.  Recent Results (from the past 240 hour(s))  Urine culture     Status: None   Collection Time: 06/10/15  1:58  PM  Result Value Ref Range Status   Specimen Description URINE, CLEAN CATCH  Final   Special Requests NONE  Final   Culture   Final    4,000 COLONIES/mL INSIGNIFICANT GROWTH Performed at [facility]    Report Status 06/14/2015 FINAL  Final  MRSA PCR Screening     Status: None   Collection Time: 06/11/15  1:55 AM  Result Value Ref Range Status   MRSA by PCR NEGATIVE NEGATIVE Final    Comment:        The GeneXpert MRSA Assay (FDA approved for NASAL specimens only), is one component of a comprehensive MRSA colonization surveillance program. It is not intended to diagnose MRSA infection nor to guide or monitor treatment for MRSA infections.   Stool culture     Status: None   Collection Time: 06/11/15  9:00 PM  Result Value Ref Range Status   Specimen Description STOOL  Final   Special Requests NONE  Final   Culture   Final    NO SALMONELLA, SHIGELLA, CAMPYLOBACTER, YERSINIA, OR E.COLI 0157:[id number] ISOLATED Note: REDUCED NORMAL FLORA PRESENT Performed at [facility]    Report Status 06/15/2015 FINAL  Final  Culture, blood (routine x 2)     Status: None   Collection Time: 06/11/15  9:50 PM  Result Value Ref Range Status   Specimen Description BLOOD LEFT HAND  Final   Special Requests IN PEDIATRIC BOTTLE 2CC  Final   Culture NO GROWTH 5 DAYS  Final   Report Status 06/16/2015 FINAL  Final  Culture, blood (routine x 2)     Status: None   Collection Time: 06/11/15  9:56 PM  Result Value Ref Range Status   Specimen Description BLOOD RIGHT HAND  Final   Special Requests IN PEDIATRIC BOTTLE 2CC  Final   Culture NO GROWTH 5 DAYS  Final   Report Status 06/16/2015 FINAL  Final     Studies:  Recent x-ray studies have been reviewed in detail by the Attending Physician  Scheduled Meds:  Scheduled Meds: . antiseptic oral rinse  7 mL Mouth Rinse q12n4p  . aspirin EC  162 mg Oral Daily  . buPROPion  300 mg Oral QHS  . chlorhexidine  15 mL Mouth Rinse BID  .  ciprofloxacin  400 mg Intravenous Q12H  . clonazePAM  0.5 mg Oral BID  . ipratropium-albuterol  3 mL Nebulization Once  . latanoprost  1 drop Both Eyes QHS  . metoprolol tartrate  12.5 mg Oral BID  . metroNIDAZOLE  500 mg Intravenous 4 times per day  . nitroGLYCERIN  1 inch Topical 4 times per day  . omega-3 acid ethyl esters  1 g Oral Daily  . pantoprazole  40 mg Oral Q0600  . rosuvastatin  20 mg Oral QPM    Time spent on care of this patient: 40 mins   Dimitrius Steedman, Geraldo Docker , MD  [facility] Office  [phone] Pager [phone]  On-Call/Text Page:  [email or web address]      password [email or web address]  If 7PM-7AM, please contact night-coverage [email or web address] Password [facility] 06/17/2015, 9:37 AM   LOS: 7 days   Care during the described time interval was provided by me .  I have reviewed this patient's available data, including medical history, events of note, physical examination, and all test results as part of my evaluation. I have personally reviewed and interpreted all radiology studies.   Dia Crawford, MD [phone] Pager

## 2015-06-18 DIAGNOSIS — D62 Acute posthemorrhagic anemia: Secondary | ICD-10-CM

## 2015-06-18 LAB — CBC WITH DIFFERENTIAL/PLATELET
BASOS PCT: 1 % (ref 0–1)
Basophils Absolute: 0.2 10*3/uL — ABNORMAL HIGH (ref 0.0–0.1)
EOS ABS: 0.3 10*3/uL (ref 0.0–0.7)
Eosinophils Relative: 2 % (ref 0–5)
HCT: 33.4 % — ABNORMAL LOW (ref 39.0–52.0)
Hemoglobin: 10.5 g/dL — ABNORMAL LOW (ref 13.0–17.0)
LYMPHS ABS: 2.9 10*3/uL (ref 0.7–4.0)
Lymphocytes Relative: 17 % (ref 12–46)
MCH: 28 pg (ref 26.0–34.0)
MCHC: 31.4 g/dL (ref 30.0–36.0)
MCV: 89.1 fL (ref 78.0–100.0)
MONO ABS: 1.4 10*3/uL — AB (ref 0.1–1.0)
Monocytes Relative: 8 % (ref 3–12)
NEUTROS ABS: 12.1 10*3/uL — AB (ref 1.7–7.7)
NEUTROS PCT: 72 % (ref 43–77)
Platelets: 334 10*3/uL (ref 150–400)
RBC: 3.75 MIL/uL — AB (ref 4.22–5.81)
RDW: 14.9 % (ref 11.5–15.5)
WBC Morphology: INCREASED
WBC: 16.9 10*3/uL — ABNORMAL HIGH (ref 4.0–10.5)

## 2015-06-18 LAB — TROPONIN I
Troponin I: 0.07 ng/mL — ABNORMAL HIGH (ref <0.031)
Troponin I: 0.03 ng/mL (ref <0.031)
Troponin I: <0.03 ng/mL (ref <0.031)

## 2015-06-18 LAB — CBC
HCT: 31.9 % — ABNORMAL LOW (ref 39.0–52.0)
HEMOGLOBIN: 9.9 g/dL — AB (ref 13.0–17.0)
MCH: 27.4 pg (ref 26.0–34.0)
MCHC: 31 g/dL (ref 30.0–36.0)
MCV: 88.4 fL (ref 78.0–100.0)
Platelets: 295 10*3/uL (ref 150–400)
RBC: 3.61 MIL/uL — AB (ref 4.22–5.81)
RDW: 14.7 % (ref 11.5–15.5)
WBC: 16.4 10*3/uL — AB (ref 4.0–10.5)

## 2015-06-18 LAB — MAGNESIUM: MAGNESIUM: 2.2 mg/dL (ref 1.7–2.4)

## 2015-06-18 LAB — CLOSTRIDIUM DIFFICILE BY PCR: Toxigenic C. Difficile by PCR: NEGATIVE

## 2015-06-18 MED ORDER — FUROSEMIDE 10 MG/ML IJ SOLN
10.0000 mg/h | INTRAVENOUS | Status: DC
  Administered 2015-06-18 – 2015-06-20 (×3): 10 mg/h via INTRAVENOUS
  Filled 2015-06-18 (×5): qty 25

## 2015-06-18 NOTE — Progress Notes (Signed)
[facility] Progress Note  Subjective:  Had CT angio 7/29:  IMPRESSION: 1. Mild aortoiliac plaque without significant occlusive disease. 2. No significant proximal mesenteric arterial or venous occlusive disease to suggest etiology of mesenteric ischemia. 3. Progressive changes of colitis involving the distal transverse and descending segments, with new abdominal ascites. No perforation or abscess. 4. Sigmoid diverticulosis. 5. New small pleural effusions. 6. Left nephrolithiasis without hydronephrosis.  Had some dyspnea while speaking yesterday, had CXR 06/17/15: IMPRESSION: Mild vascular congestion. Unchanged cardiomegaly. Has a lasix drip ordered to start this morning. Pt reports less SOB. Had bloody, loose BM this morning. Has some LLQ and suprapubic pain.  WBC 16 today. Afebrile. Objective:  Vital signs in last 24 hours: Temp:  [97.8 F (36.6 C)-98.4 F (36.9 C)] 98 F (36.7 C) (07/30 0700) Pulse Rate:  [72-87] 77 (07/30 0328) Resp:  [18-30] 19 (07/30 0328) BP: (112-142)/(46-88) 126/55 mmHg (07/30 0328) SpO2:  [92 %-97 %] 97 % (07/30 0328) Last BM Date: 06/17/15 General:   Alert,  Well-developed, has some dyspnea when speaking--but says less than yesterday, Heart:  Regular rate and rhythm; no murmurs Pulm;diminished breath sounds Abdomen:  obese, distended, mild, focal tenderness in LLQ. BS present but hypoactive  Extremities:  Without edema. Neurologic:  Alert and  oriented x4;  grossly normal neurologically. Psych: Alert and cooperative. Normal mood and affect.  Intake/Output from previous day: 07/29 0701 - 07/30 0700 In: 1264.7 [I.V.:664.7; IV Piggyback:600] Out: 3300 [Urine:3300] Intake/Output this shift:    Lab Results:  Recent Labs  06/16/15 0327 06/17/15 0310 06/18/15 0350  WBC 18.3* 18.8* 16.4*  HGB 11.8* 10.1* 9.9*  HCT 37.5* 31.6* 31.9*  PLT 217 240 295   BMET  Recent Labs  06/16/15 0327 06/17/15 0310  NA 136 137    K 3.9 3.4*  CL 103 103  CO2 28 30  GLUCOSE 135* 132*  BUN 9 6  CREATININE 0.94 1.02  CALCIUM 7.8* 7.7*    Dg Chest Port 1 View  06/17/2015   CLINICAL DATA:  Dyspnea on exertion  EXAM: PORTABLE CHEST - 1 VIEW  COMPARISON:  06/10/2015  FINDINGS: There is mild unchanged cardiomegaly. There is mild vascular fullness. No confluent consolidation is evident. No large effusion is evident. No pneumothorax is evident.  IMPRESSION: Mild vascular congestion.  Unchanged cardiomegaly.   Electronically Signed   By: Andreas Newport M.D.   On: 06/17/2015 18:34   Ct Angio Abd/pel W/ And/or W/o  06/17/2015   CLINICAL DATA:  Ischemic colitis, abdominal pain, bloody diarrhea. No abdominal surgery.  EXAM: CT ANGIOGRAPHY ABDOMEN AND PELVIS  TECHNIQUE: Multidetector CT imaging of the abdomen and pelvis was performed using the standard protocol during bolus administration of intravenous contrast. Multiplanar reconstructed images including MIPs were obtained and reviewed to evaluate the vascular anatomy.  CONTRAST:  121mL OMNIPAQUE IOHEXOL 350 MG/ML SOLN  COMPARISON:  COMPARISON CT 06/10/2015  FINDINGS: ARTERIAL FINDINGS:  Aorta: No aneurysm, dissection, or stenosis. Calcified plaque in the infrarenal segment.  Celiac axis:          Patent  Superior mesenteric:  Patent, classic distal branch anatomy.  Left renal:           Single, patent  Right renal:          Single, patent  Inferior mesenteric:  Patent  Left iliac: Mild scattered plaque. No aneurysm, dissection, or stenosis.  Right iliac: Scattered calcified nonocclusive plaque. No aneurysm, dissection, or stenosis.  Venous findings: Patent hepatic  veins, portal vein, SMV, splenic vein, bilateral renal veins.  Review of the MIP images confirms the above findings.  Nonvascular findings: Small pleural effusions left greater than right, new since prior scan. Dependent atelectasis in the visualized lung bases. Fatty liver without focal lesion. Unremarkable gallbladder, spleen,  adrenal glands, pancreas. 5 mm calculus in the lower pole left renal collecting system. No hydronephrosis. 11 mm probable exophytic cyst from the upper pole right kidney. Ureters decompressed.  Stomach and small bowel are decompressed. Circumferential wall thickening involving distal transverse colon, splenic flexure, and descending segment, progressive since previous exam. Multiple sigmoid diverticula. Inflammatory/edematous changes around the distal transverse and descending segments. There is a small amount of ascites in the pericolic gutters and around the liver, new since previous exam. No free air. Note loculated extraluminal collections to suggest abscess. Urinary bladder is physiologically distended. No adenopathy localized.  IMPRESSION: 1. Mild aortoiliac plaque without significant occlusive disease. 2. No significant proximal mesenteric arterial or venous occlusive disease to suggest etiology of mesenteric ischemia. 3. Progressive changes of colitis involving the distal transverse and descending segments, with new abdominal ascites. No perforation or abscess. 4. Sigmoid diverticulosis. 5. New small pleural effusions. 6. Left nephrolithiasis without hydronephrosis.   Electronically Signed   By: Lucrezia Europe M.D.   On: 06/17/2015 13:37    ASSESSMENT/PLAN:   Hematochezia with left abdominal pain, began 7/21. Colitis on CT, presumed ischemic, involving distal transverse to descending colon. Continue clear liquids. Fecal lactoferrin positive, stool cultures negative. C diff ordered (several courses abx over past year) but not collected. Hold anticoag. CXR with vascular congestion--pt to be started on lasix drip ( per medical service)     LOS: 8 days   Hvozdovic, Deloris Ping 06/18/2015, Pager [phone]    Attending physician's note   I have taken an interval history, reviewed the chart and examined the patient. I agree with the Advanced Practitioner's note, impression and recommendations. Still  dyspneic but improved since yesterday. Treated for pulm edema on lasix. Small volume hematochezia today. Hb has drifted down to 9.9. Consider colonoscopy at some point when cardiopulmonary status has stabilized.   Pricilla Riffle. Fuller Plan, MD [facility] [phone] pager Mon-Fri 8a-5p [phone] weekends, holidays and 5p-8a or per [facility]

## 2015-06-18 NOTE — Progress Notes (Signed)
[facility] TEAM 1 - Stepdown/ICU TEAM Progress Note  ALIJAH AKRAM DUK:[id number] DOB: 08/16/1951 DOA: 06/10/2015 PCP: No primary care provider on file.  Admit HPI / Brief Narrative: 64 y.o. WM PMHx HTN, HLD, CAD with coronary angiogram with stenting approximately 5-6 years ago, GERD, S/P colonoscopy~2011 showing diverticulitis (reported by patient).   Cardiologist up in [name] that he follows with yearly. He presents to the hospital with increasing abdominal pain, bloody diarrhea that started at approximately 1 this morning. He has had multiple episodes of bloody diarrhea that has progressed throughout the morning, accompanied by feeling extremely ill with left-sided, substernal chest pain, diaphoresis, cold sweats, and retching. At approximately 11 AM, the patient decided to present to the emergency room for evaluation. He has had no bloody diarrhea since that time.  With his chest pain, the patient has had left-sided, substernal chest pain doubt radiation, associated with diaphoresis, cold sweats, nausea. He had the chest pain for approximately 6-7 hours, but this has gradually trailed off since being at the hospital. He currently reports no chest pain.    HPI/Subjective: 7/30 A/O 4, patient states fatigued, but believes SOB may be improving. States had 3 episodes of yellowish watery discharge from rectum, no blood apparent.    Assessment/Plan: NSTEMI - Admitting physician discussed this patient with Dr. Tommi Rumps of (cardiology) recommended starting anticoagulation. -7/28 D/Ced heparin 2dary to continue GI bleed overnight..   -Continue Nitropaste; currently CP free -Troponins continue to trend down.  -Continue Crestor 20 mg daily per cardiology (cardioprotective) -Strict I&O; since admission + 7.8 L -Daily weight standing scale -Spoke with Dr. Ron Parker (cardiology) yesterday evening concerning cardiac function, now not totally sure  NSTEMI; believe this may just be demand ischemia  secondary to stress of infection.  Increasing DOE -EKG compared to EKG from 7/22 no significant change  HTN -Continue metoprolol 12.5 mg BID -Continue Nitropaste -Start hydralazine IV 5 mg PRN SBP> 160 or DBP> 100  Pulmonary nodule (previous smoker) -6 mm pulmonary nodule in RLL -Patient previous smoker require follow-up chest CT in 6-12 months  Pulmonary edema -Lasix drip 10mg /hr  Infectious Colitis/ischemic colitis/Sepsis unspecified organism -7/30 WBC trended down -Continue Cipro and Flagyl -Stool culture negative -Maintain NPO; patient has not eaten since 7/22 -Positive BRBPR; see NSTEMI, continue Protonix IV 40 mg BID -7/29 Repeat CT abdomen pelvis; showing worsening colitis;  spoke with Dr.Malcolm T Fuller Plan (GI), and he feels we may have to reluctantly perform colonoscopy, but would like patient to be more stable. -Consult ID in the a.m?  UTI -Urine culture negative -Typical organisms would be covered by current antibiotics  Acute on Chronic kidney failure stage II (no previous Cr for baseline) -Continue hydration -Cr normalized -Hold all nephrotoxic medication  Hypokalemia -Potassium goal> 4  Acute blood loss anemia -May be stabilizing     Code Status: FULL Family Communication: None present at time of exam Disposition Plan: Per cardiology    Consultants: Dr.Daniel Tommi Rumps (cardiology)  Dr.Malcolm Sindy Guadeloupe (GI)    Procedure/Significant Events: 7/22 CT abdomen pelvis without contrast;-Probable mild thickening walls of descending colon mild colitis   infectious vs inflammatory nature. -Associated subtle inflammatory stranding W/I paracolic fat and trace free fluid in the lower left paracolic gutter. -7 mm pulmonary nodule within the RLL  -1 cm mass exophytic to the lateral cortex of the right kidney; most likely benign cyst -Left nephrolithiasis. 7/24 Echocardiogram; - Left ventricle: moderateLVH.-LVEF= 60%.  7/24 CT chest without contrast;-pleural  effusions with associated compressive atelectasis. -6  mm RLL pulmonary nodule. 7/29 CT angiogram abdomen pelvis;-Mild aortoiliac plaque without significant occlusive disease. - No significant proximal mesenteric arterial or venous occlusive Dz to suggest etiology of mesenteric ischemia. - Progressive changes of colitis involving the distal transverse and descending segments, with new abdominal ascites. No perforation or abscess. - Sigmoid diverticulosis. -Left nephrolithiasis without hydronephrosis. 7/29 PCXR; shows mild pulmonary congestion not significantly increased    Culture 7/22 urine negative  7/23 blood left/right hand NGTD 7/23 MRSA by PCR negative 7/23 stool Negative   Antibiotics: Ciprofloxacin 7/22>> Flagyl 7/22>>  DVT prophylaxis: SCD  Devices    LINES / TUBES:      Continuous Infusions: . dextrose 5 % and 0.9% NaCl 10 mL/hr at 06/17/15 1837    Objective: VITAL SIGNS: Temp: 98.1 F (36.7 C) (07/30 0328) Temp Source: Oral (07/30 0328) BP: 126/55 mmHg (07/30 0328) Pulse Rate: 77 (07/30 0328) SPO2; FIO2:   Intake/Output Summary (Last 24 hours) at 06/18/15 0836 Last data filed at 06/18/15 4481  Gross per 24 hour  Intake 1214.67 ml  Output   3300 ml  Net -2085.33 ml     Exam: General:  A/O 4, NAD, negative respiratory distress Eyes: Negative headache, eye pain, double vision, negative scleral hemorrhage ENT: Negative Runny nose, negative ear pain, negative tinnitus, negative gingival bleeding Neck:  Negative scars, masses, torticollis, lymphadenopathy, JVD Lungs: Clear to auscultation bilaterally without wheezes or crackles Cardiovascular: Regular rate and rhythm without murmur gallop or rub normal S1 and S2 Abdomen:  Morbidly obese, negative abdominal pain , positive hypoactive bowel sounds, no rebound, no ascites, no appreciable mass Extremities: No significant cyanosis, clubbing, or edema bilateral lower extremities Psychiatric:  Negative  depression, negative anxiety, negative fatigue, negative mania  Neurologic:  Cranial nerves II through XII intact, tongue/uvula midline, all extremities muscle strength 5/5, sensation intact throughout,  negative dysarthria, negative expressive aphasia, negative receptive aphasia.     Data Reviewed: Basic Metabolic Panel:  Recent Labs Lab 06/13/15 0240 06/14/15 0305 06/15/15 0321 06/16/15 0327 06/17/15 0310 06/18/15 0350  NA 137 136 137 136 137  --   K 4.6 4.6 3.9 3.9 3.4*  --   CL 103 102 103 103 103  --   CO2 28 30 30 28 30   --   GLUCOSE 110* 113* 92 135* 132*  --   BUN 11 12 11 9 6   --   CREATININE 1.26* 1.27* 1.11 0.94 1.02  --   CALCIUM 7.9* 7.9* 7.8* 7.8* 7.7*  --   MG 2.2 2.2 2.1 2.2 2.3 2.2   Liver Function Tests:  Recent Labs Lab 06/11/15 1200  AST 32  ALT 21  ALKPHOS 59  BILITOT 1.4*  PROT 6.2*  ALBUMIN 3.4*   No results for input(s): LIPASE, AMYLASE in the last 168 hours. No results for input(s): AMMONIA in the last 168 hours. CBC:  Recent Labs Lab 06/14/15 0305 06/15/15 0321 06/16/15 0327 06/17/15 0310 06/18/15 0350  WBC 16.6* 14.5* 18.3* 18.8* 16.4*  HGB 12.0* 11.4* 11.8* 10.1* 9.9*  HCT 39.2 37.2* 37.5* 31.6* 31.9*  MCV 92.0 91.2 89.7 89.0 88.4  PLT 207 244 217 240 295   Cardiac Enzymes:  Recent Labs Lab 06/16/15 1938 06/17/15 0310 06/17/15 1130 06/17/15 1902 06/18/15 0350  TROPONINI 0.06* 0.06* 0.03 0.04* 0.07*   BNP (last 3 results)  Recent Labs  06/10/15 1225  BNP 16.0    ProBNP (last 3 results) No results for input(s): PROBNP in the last 8760 hours.  CBG: No  results for input(s): GLUCAP in the last 168 hours.  Recent Results (from the past 240 hour(s))  Urine culture     Status: None   Collection Time: 06/10/15  1:58 PM  Result Value Ref Range Status   Specimen Description URINE, CLEAN CATCH  Final   Special Requests NONE  Final   Culture   Final    4,000 COLONIES/mL INSIGNIFICANT GROWTH Performed at [facility]    Report Status 06/14/2015 FINAL  Final  MRSA PCR Screening     Status: None   Collection Time: 06/11/15  1:55 AM  Result Value Ref Range Status   MRSA by PCR NEGATIVE NEGATIVE Final    Comment:        The GeneXpert MRSA Assay (FDA approved for NASAL specimens only), is one component of a comprehensive MRSA colonization surveillance program. It is not intended to diagnose MRSA infection nor to guide or monitor treatment for MRSA infections.   Stool culture     Status: None   Collection Time: 06/11/15  9:00 PM  Result Value Ref Range Status   Specimen Description STOOL  Final   Special Requests NONE  Final   Culture   Final    NO SALMONELLA, SHIGELLA, CAMPYLOBACTER, YERSINIA, OR E.COLI 0157:H7 ISOLATED Note: REDUCED NORMAL FLORA PRESENT Performed at [facility]    Report Status 06/15/2015 FINAL  Final  Culture, blood (routine x 2)     Status: None   Collection Time: 06/11/15  9:50 PM  Result Value Ref Range Status   Specimen Description BLOOD LEFT HAND  Final   Special Requests IN PEDIATRIC BOTTLE 2CC  Final   Culture NO GROWTH 5 DAYS  Final   Report Status 06/16/2015 FINAL  Final  Culture, blood (routine x 2)     Status: None   Collection Time: 06/11/15  9:56 PM  Result Value Ref Range Status   Specimen Description BLOOD RIGHT HAND  Final   Special Requests IN PEDIATRIC BOTTLE 2CC  Final   Culture NO GROWTH 5 DAYS  Final   Report Status 06/16/2015 FINAL  Final     Studies:  Recent x-ray studies have been reviewed in detail by the Attending Physician  Scheduled Meds:  Scheduled Meds: . antiseptic oral rinse  7 mL Mouth Rinse q12n4p  . aspirin EC  162 mg Oral Daily  . buPROPion  300 mg Oral QHS  . chlorhexidine  15 mL Mouth Rinse BID  . ciprofloxacin  400 mg Intravenous Q12H  . clonazePAM  0.5 mg Oral BID  . ipratropium-albuterol  3 mL Nebulization Once  . latanoprost  1 drop Both Eyes QHS  . metoprolol tartrate  12.5 mg Oral BID  .  metroNIDAZOLE  500 mg Intravenous 4 times per day  . nitroGLYCERIN  1 inch Topical 4 times per day  . omega-3 acid ethyl esters  1 g Oral Daily  . pantoprazole  40 mg Oral BID  . rosuvastatin  20 mg Oral QPM    Time spent on care of this patient: 40 mins   Senaida Chilcote, Geraldo Docker , MD  [facility] Office  [phone] Pager [phone]  On-Call/Text Page:      [email or web address]      password [email or web address]  If 7PM-7AM, please contact night-coverage [email or web address] Password [email or web address] 06/18/2015, 8:36 AM   LOS: 8 days   Care during the described time interval was provided by me .  I have reviewed this patient's available data, including medical history,  events of note, physical examination, and all test results as part of my evaluation. I have personally reviewed and interpreted all radiology studies.   Dia Crawford, MD [phone] Pager

## 2015-06-19 DIAGNOSIS — N189 Chronic kidney disease, unspecified: Secondary | ICD-10-CM

## 2015-06-19 DIAGNOSIS — R1084 Generalized abdominal pain: Secondary | ICD-10-CM

## 2015-06-19 DIAGNOSIS — N179 Acute kidney failure, unspecified: Secondary | ICD-10-CM | POA: Diagnosis present

## 2015-06-19 DIAGNOSIS — R0609 Other forms of dyspnea: Secondary | ICD-10-CM

## 2015-06-19 LAB — CBC
HCT: 33.9 % — ABNORMAL LOW (ref 39.0–52.0)
Hemoglobin: 10.6 g/dL — ABNORMAL LOW (ref 13.0–17.0)
MCH: 27.5 pg (ref 26.0–34.0)
MCHC: 31.3 g/dL (ref 30.0–36.0)
MCV: 87.8 fL (ref 78.0–100.0)
Platelets: 298 10*3/uL (ref 150–400)
RBC: 3.86 MIL/uL — ABNORMAL LOW (ref 4.22–5.81)
RDW: 14.7 % (ref 11.5–15.5)
WBC: 12.8 10*3/uL — ABNORMAL HIGH (ref 4.0–10.5)

## 2015-06-19 LAB — BASIC METABOLIC PANEL
Anion gap: 12 (ref 5–15)
BUN: 9 mg/dL (ref 6–20)
CO2: 34 mmol/L — AB (ref 22–32)
Calcium: 7.6 mg/dL — ABNORMAL LOW (ref 8.9–10.3)
Chloride: 89 mmol/L — ABNORMAL LOW (ref 101–111)
Creatinine, Ser: 1.52 mg/dL — ABNORMAL HIGH (ref 0.61–1.24)
GFR calc Af Amer: 54 mL/min — ABNORMAL LOW (ref >60)
GFR, EST NON AFRICAN AMERICAN: 46 mL/min — AB (ref >60)
GLUCOSE: 137 mg/dL — AB (ref 65–99)
POTASSIUM: 2.6 mmol/L — AB (ref 3.5–5.1)
Sodium: 135 mmol/L (ref 135–145)

## 2015-06-19 LAB — TROPONIN I: Troponin I: <0.03 ng/mL (ref <0.031)

## 2015-06-19 LAB — MAGNESIUM: Magnesium: 2.1 mg/dL (ref 1.7–2.4)

## 2015-06-19 MED ORDER — POTASSIUM CHLORIDE CRYS ER 20 MEQ PO TBCR
30.0000 meq | EXTENDED_RELEASE_TABLET | Freq: Once | ORAL | Status: AC
  Administered 2015-06-19: 30 meq via ORAL
  Filled 2015-06-19: qty 1

## 2015-06-19 MED ORDER — POTASSIUM CHLORIDE 10 MEQ/100ML IV SOLN
10.0000 meq | INTRAVENOUS | Status: AC
  Administered 2015-06-19 (×2): 10 meq via INTRAVENOUS
  Filled 2015-06-19 (×2): qty 100

## 2015-06-19 NOTE — Progress Notes (Signed)
     [facility] Progress Note  Subjective:  Feels better. Less dyspneic. Less abd pressure. Had a BM this am with some blood, but says it was much less blood than he has had over the past few days. No abd pain. On cipro and flagyl.   Objective:  Vital signs in last 24 hours: Temp:  [97.7 F (36.5 C)-98.7 F (37.1 C)] 97.7 F (36.5 C) (07/31 1100) Pulse Rate:  [67-84] 82 (07/31 1100) Resp:  [19-29] 22 (07/31 1100) BP: (98-145)/(48-73) 102/57 mmHg (07/31 0735) SpO2:  [92 %-97 %] 94 % (07/31 1100) Weight:  [290 lb 12.6 oz (131.9 kg)-291 lb (131.997 kg)] 290 lb 12.6 oz (131.9 kg) (07/31 0420) Last BM Date: 06/18/15 General:   Alert, well-developed, in NAD, sitting in chair. Heart:  Regular rate and rhythm; no murmurs Pulm;lungs clear Abdomen:  Soft, nontender and nondistended. Normal bowel sounds, without guarding, and without rebound.   Extremities:  Without edema. Neurologic:  Alert and  oriented x4;  grossly normal neurologically. Psych:  Alert and cooperative. Normal mood and affect.  Intake/Output from previous day: 07/30 0701 - 07/31 0700 In: 1455 [P.O.:660; I.V.:95; IV Piggyback:700] Out: 5600 [Urine:5600] Intake/Output this shift:    Lab Results:  Recent Labs  06/18/15 0350 06/18/15 0950 06/19/15 0252  WBC 16.4* 16.9* 12.8*  HGB 9.9* 10.5* 10.6*  HCT 31.9* 33.4* 33.9*  PLT 295 334 298   BMET  Recent Labs  06/17/15 0310  NA 137  K 3.4*  CL 103  CO2 30  GLUCOSE 132*  BUN 6  CREATININE 1.02  CALCIUM 7.7*     ASSESSMENT/PLAN:   Hematochezia with left abdominal pain, began 7/21. Colitis on CT, presumed ischemic, involving distal transverse to descending colon. On lasix drip with improvement of dyspnea. Small volume hematochezia today. Hgb stable. Consider colonoscopy at some point when cardiopulmonary status has stabilized.     LOS: 9 days   Hvozdovic, Deloris Ping 06/19/2015, Pager [phone]     Attending physician's note   I  have taken an interval history, reviewed the chart and examined the patient. I agree with the Advanced Practitioner's note, impression and recommendations. Presumed ischemic colitis, clinically improving, abdominal pain has resolved and much less rectal bleeding. Volume overload steadily improving. If GI symptoms continues to improve will plan for an outpatient colonoscopy in several weeks. If he fails to improve colonoscopy during this admission. Advance diet to full liquids.   Pricilla Riffle. Fuller Plan, MD [facility] [phone] pager Mon-Fri 8a-5p [phone] weekends, holidays and 5p-8a or per [facility]

## 2015-06-19 NOTE — Progress Notes (Signed)
[facility] - Stepdown/ICU TEAM Progress Note  Sergio Griffin TWS:[id number] DOB: Mar 07, 1951 DOA: 06/10/2015 PCP: No primary care provider on file.  Admit HPI / Brief Narrative: 64 y.o. WM PMHx HTN, HLD, CAD with coronary angiogram with stenting approximately 5-6 years ago, GERD, S/P colonoscopy~2011 showing diverticulitis (reported by patient).   Cardiologist up in [place] that he follows with yearly. He presents to the hospital with increasing abdominal pain, bloody diarrhea that started at approximately Griffin this morning. He has had multiple episodes of bloody diarrhea that has progressed throughout the morning, accompanied by feeling extremely ill with left-sided, substernal chest pain, diaphoresis, cold sweats, and retching. At approximately 11 AM, the patient decided to present to the emergency room for evaluation. He has had no bloody diarrhea since that time.  With his chest pain, the patient has had left-sided, substernal chest pain doubt radiation, associated with diaphoresis, cold sweats, nausea. He had the chest pain for approximately 6-7 hours, but this has gradually trailed off since being at the hospital. He currently reports no chest pain.    HPI/Subjective: 7/31 A/O 4, states SOB has improved significantly was able to sit up and eat breakfast side of his bed. States abdominal pain also has improved. Did have watery stools overnight with blood but decreased from previous night.     Assessment/Plan: NSTEMI - Admitting physician discussed this patient with Dr. Tommi Rumps of (cardiology) recommended starting anticoagulation. -7/28 D/Ced heparin 2dary to continue GI bleed overnight..   -Continue Nitropaste; currently CP free -Troponins WNL -Continue Crestor 20 mg daily per cardiology (cardioprotective) -Strict I&O; since admission + 3.7 L -Daily weight                                 7/31 bed= 131.9 kg -Spoke with Dr. Ron Parker (cardiology) yesterday evening concerning cardiac  function, now not totally sure  NSTEMI; believe this may just be demand ischemia secondary to stress of infection.  Increasing DOE -EKG compared to EKG from 7/22 no significant change  HTN -Continue metoprolol 12.5 mg BID -Continue Nitropaste -Start hydralazine IV 5 mg PRN SBP> 160 or DBP> 100  Pulmonary nodule (previous smoker) -6 mm pulmonary nodule in RLL -Patient previous smoker require follow-up chest CT in 6-12 months  Pulmonary edema -Lasix drip 10mg /hr  Infectious Colitis/ischemic colitis/Sepsis unspecified organism -7/30 WBC trended down -Continue Cipro and Flagyl -Stool culture negative -Maintain NPO; patient has not eaten since 7/22 -Positive BRBPR; see NSTEMI, continue Protonix IV 40 mg BID -7/29 Repeat CT abdomen pelvis; showing worsening colitis;  spoke with Dr.Malcolm T Fuller Plan (GI), and he feels we may have to reluctantly perform colonoscopy, but would like patient to be more stable. -Consult ID in the a.m?  Acute on Chronic kidney failure stage II (no previous Cr for baseline) -Continue hydration -Cr normalized -Hold all nephrotoxic medication  Hypokalemia -Potassium goal> 4 -Potassium IV 10 mEq 5 runs  Acute blood loss anemia -May be stabilizing     Code Status: FULL Family Communication: None present at time of exam Disposition Plan: Per cardiology    Consultants: Dr.Daniel Tommi Rumps (cardiology)  Dr.Malcolm Sindy Guadeloupe (GI)    Procedure/Significant Events: 7/22 CT abdomen pelvis without contrast;-Probable mild thickening walls of descending colon mild colitis   infectious vs inflammatory nature. -Associated subtle inflammatory stranding W/I paracolic fat and trace free fluid in the lower left paracolic gutter. -7 mm pulmonary nodule within the RLL  -Griffin  cm mass exophytic to the lateral cortex of the right kidney; most likely benign cyst -Left nephrolithiasis. 7/24 Echocardiogram; - Left ventricle: moderateLVH.-LVEF= 60%.  7/24 CT chest without  contrast;-pleural effusions with associated compressive atelectasis. -6 mm RLL pulmonary nodule. 7/29 CT angiogram abdomen pelvis;-Mild aortoiliac plaque without significant occlusive disease. - No significant proximal mesenteric arterial or venous occlusive Dz to suggest etiology of mesenteric ischemia. - Progressive changes of colitis involving the distal transverse and descending segments, with new abdominal ascites. No perforation or abscess. - Sigmoid diverticulosis. -Left nephrolithiasis without hydronephrosis. 7/29 PCXR; shows mild pulmonary congestion not significantly increased    Culture 7/22 urine negative  7/23 blood left/right hand NGTD 7/23 MRSA by PCR negative 7/23 stool Negative 7/30 C. difficile by PCR negative   Antibiotics: Ciprofloxacin 7/22>> Flagyl 7/22>>  DVT prophylaxis: SCD  Devices    LINES / TUBES:      Continuous Infusions: . furosemide (LASIX) infusion 10 mg/hr (06/19/15 0900)    Objective: VITAL SIGNS: Temp: 97.7 F (36.5 C) (07/31 1100) Temp Source: Oral (07/31 1100) BP: 95/57 mmHg (07/31 1150) Pulse Rate: 75 (07/31 1150) SPO2; FIO2:   Intake/Output Summary (Last 24 hours) at 06/19/15 1325 Last data filed at 06/19/15 1124  Gross per 24 hour  Intake    820 ml  Output   5100 ml  Net  -4280 ml     Exam: General:  A/O 4, NAD, negative respiratory distress Eyes: Negative headache, eye pain, double vision, negative scleral hemorrhage ENT: Negative Runny nose, negative ear pain, negative tinnitus, negative gingival bleeding Neck:  Negative scars, masses, torticollis, lymphadenopathy, JVD Lungs: Clear to auscultation bilaterally without wheezes or crackles Cardiovascular: Regular rate and rhythm without murmur gallop or rub normal S1 and S2 Abdomen:  Morbidly obese, mild abdominal pain significantly improved over the last 2 days , positive hypoactive bowel sounds, no rebound, no ascites, no appreciable mass Extremities: No  significant cyanosis, clubbing, or edema bilateral lower extremities Psychiatric:  Negative depression, negative anxiety, negative fatigue, negative mania  Neurologic:  Cranial nerves II through XII intact, tongue/uvula midline, all extremities muscle strength 5/5, sensation intact throughout,  negative dysarthria, negative expressive aphasia, negative receptive aphasia.     Data Reviewed: Basic Metabolic Panel:  Recent Labs Lab 06/14/15 0305 06/15/15 0321 06/16/15 0327 06/17/15 0310 06/18/15 0350 06/19/15 0252 06/19/15 1140  NA 136 137 136 137  --   --  135  K 4.6 3.9 3.9 3.4*  --   --  2.6*  CL 102 103 103 103  --   --  89*  CO2 30 30 28 30   --   --  34*  GLUCOSE 113* 92 135* 132*  --   --  137*  BUN 12 11 9 6   --   --  9  CREATININE Griffin.27* Griffin.11 0.94 Griffin.02  --   --  Griffin.52*  CALCIUM 7.9* 7.8* 7.8* 7.7*  --   --  7.6*  MG 2.2 2.Griffin 2.2 2.3 2.2 2.Griffin  --    Liver Function Tests: No results for input(s): AST, ALT, ALKPHOS, BILITOT, PROT, ALBUMIN in the last 168 hours. No results for input(s): LIPASE, AMYLASE in the last 168 hours. No results for input(s): AMMONIA in the last 168 hours. CBC:  Recent Labs Lab 06/16/15 0327 06/17/15 0310 06/18/15 0350 06/18/15 0950 06/19/15 0252  WBC 18.3* 18.8* 16.4* 16.9* 12.8*  NEUTROABS  --   --   --  12.Griffin*  --   HGB 11.8* 10.Griffin* 9.9*  10.5* 10.6*  HCT 37.5* 31.6* 31.9* 33.4* 33.9*  MCV 89.7 89.0 88.4 89.Griffin 87.8  PLT 217 240 295 334 298   Cardiac Enzymes:  Recent Labs Lab 06/17/15 1902 06/18/15 0350 06/18/15 1200 06/18/15 1850 06/19/15 0252  TROPONINI 0.04* 0.07* 0.03 <0.03 <0.03   BNP (last 3 results)  Recent Labs  06/10/15 1225  BNP 16.0    ProBNP (last 3 results) No results for input(s): PROBNP in the last 8760 hours.  CBG: No results for input(s): GLUCAP in the last 168 hours.  Recent Results (from the past 240 hour(s))  Urine culture     Status: None   Collection Time: 06/10/15  Griffin:58 PM  Result Value Ref Range  Status   Specimen Description URINE, CLEAN CATCH  Final   Special Requests NONE  Final   Culture   Final    4,000 COLONIES/mL INSIGNIFICANT GROWTH Performed at [facility]    Report Status 06/14/2015 FINAL  Final  MRSA PCR Screening     Status: None   Collection Time: 06/11/15  Griffin:55 AM  Result Value Ref Range Status   MRSA by PCR NEGATIVE NEGATIVE Final    Comment:        The GeneXpert MRSA Assay (FDA approved for NASAL specimens only), is one component of a comprehensive MRSA colonization surveillance program. It is not intended to diagnose MRSA infection nor to guide or monitor treatment for MRSA infections.   Stool culture     Status: None   Collection Time: 06/11/15  9:00 PM  Result Value Ref Range Status   Specimen Description STOOL  Final   Special Requests NONE  Final   Culture   Final    NO SALMONELLA, SHIGELLA, CAMPYLOBACTER, YERSINIA, OR E.COLI 0157:H7 ISOLATED Note: REDUCED NORMAL FLORA PRESENT Performed at [facility]    Report Status 06/15/2015 FINAL  Final  Culture, blood (routine x 2)     Status: None   Collection Time: 06/11/15  9:50 PM  Result Value Ref Range Status   Specimen Description BLOOD LEFT HAND  Final   Special Requests IN PEDIATRIC BOTTLE 2CC  Final   Culture NO GROWTH 5 DAYS  Final   Report Status 06/16/2015 FINAL  Final  Culture, blood (routine x 2)     Status: None   Collection Time: 06/11/15  9:56 PM  Result Value Ref Range Status   Specimen Description BLOOD RIGHT HAND  Final   Special Requests IN PEDIATRIC BOTTLE 2CC  Final   Culture NO GROWTH 5 DAYS  Final   Report Status 06/16/2015 FINAL  Final  Clostridium Difficile by PCR (not at [facility])     Status: None   Collection Time: 06/18/15  Griffin:10 PM  Result Value Ref Range Status   C difficile by pcr NEGATIVE NEGATIVE Final     Studies:  Recent x-ray studies have been reviewed in detail by the Attending Physician  Scheduled Meds:  Scheduled Meds: . antiseptic  oral rinse  7 mL Mouth Rinse q12n4p  . aspirin EC  162 mg Oral Daily  . buPROPion  300 mg Oral QHS  . chlorhexidine  15 mL Mouth Rinse BID  . ciprofloxacin  400 mg Intravenous Q12H  . clonazePAM  0.5 mg Oral BID  . ipratropium-albuterol  3 mL Nebulization Once  . latanoprost  Griffin drop Both Eyes QHS  . metoprolol tartrate  12.5 mg Oral BID  . metroNIDAZOLE  500 mg Intravenous 4 times per day  . nitroGLYCERIN  Griffin inch Topical 4 times per day  . omega-3 acid ethyl esters  Griffin g Oral Daily  . pantoprazole  40 mg Oral BID  . potassium chloride  10 mEq Intravenous Q1 Hr x 5  . rosuvastatin  20 mg Oral QPM    Time spent on care of this patient: 40 mins   WOODS, Geraldo Docker , MD  [facility] Office  [phone] Pager - [phone]  On-Call/Text Page:      [email or web address]      password [email or web address]  If 7PM-7AM, please contact night-coverage [email or web address] Password [email or web address] 06/19/2015, Griffin:25 PM   LOS: 9 days   Care during the described time interval was provided by me .  I have reviewed this patient's available data, including medical history, events of note, physical examination, and all test results as part of my evaluation. I have personally reviewed and interpreted all radiology studies.   Dia Crawford, MD [phone] Pager

## 2015-06-20 DIAGNOSIS — R100 Acute abdomen: Secondary | ICD-10-CM

## 2015-06-20 DIAGNOSIS — J810 Acute pulmonary edema: Secondary | ICD-10-CM

## 2015-06-20 LAB — CBC
HCT: 35.1 % — ABNORMAL LOW (ref 39.0–52.0)
Hemoglobin: 11.2 g/dL — ABNORMAL LOW (ref 13.0–17.0)
MCH: 28.1 pg (ref 26.0–34.0)
MCHC: 31.9 g/dL (ref 30.0–36.0)
MCV: 88 fL (ref 78.0–100.0)
Platelets: 342 10*3/uL (ref 150–400)
RBC: 3.99 MIL/uL — ABNORMAL LOW (ref 4.22–5.81)
RDW: 14.3 % (ref 11.5–15.5)
WBC: 12.7 10*3/uL — ABNORMAL HIGH (ref 4.0–10.5)

## 2015-06-20 LAB — BASIC METABOLIC PANEL
ANION GAP: 8 (ref 5–15)
BUN: 12 mg/dL (ref 6–20)
CO2: 37 mmol/L — ABNORMAL HIGH (ref 22–32)
Calcium: 7.8 mg/dL — ABNORMAL LOW (ref 8.9–10.3)
Chloride: 91 mmol/L — ABNORMAL LOW (ref 101–111)
Creatinine, Ser: 1.58 mg/dL — ABNORMAL HIGH (ref 0.61–1.24)
GFR, EST AFRICAN AMERICAN: 51 mL/min — AB (ref >60)
GFR, EST NON AFRICAN AMERICAN: 44 mL/min — AB (ref >60)
GLUCOSE: 118 mg/dL — AB (ref 65–99)
Potassium: 2.9 mmol/L — ABNORMAL LOW (ref 3.5–5.1)
SODIUM: 136 mmol/L (ref 135–145)

## 2015-06-20 LAB — MAGNESIUM: MAGNESIUM: 2.1 mg/dL (ref 1.7–2.4)

## 2015-06-20 MED ORDER — CIPROFLOXACIN HCL 500 MG PO TABS
500.0000 mg | ORAL_TABLET | Freq: Two times a day (BID) | ORAL | Status: DC
  Administered 2015-06-20 – 2015-06-22 (×5): 500 mg via ORAL
  Filled 2015-06-20 (×7): qty 1

## 2015-06-20 MED ORDER — METRONIDAZOLE 250 MG PO TABS
250.0000 mg | ORAL_TABLET | Freq: Three times a day (TID) | ORAL | Status: DC
  Administered 2015-06-20 – 2015-06-22 (×7): 250 mg via ORAL
  Filled 2015-06-20 (×10): qty 1

## 2015-06-20 MED ORDER — POTASSIUM CHLORIDE CRYS ER 20 MEQ PO TBCR
40.0000 meq | EXTENDED_RELEASE_TABLET | Freq: Two times a day (BID) | ORAL | Status: AC
  Administered 2015-06-20 – 2015-06-21 (×3): 40 meq via ORAL
  Filled 2015-06-20 (×3): qty 2

## 2015-06-20 NOTE — Care Management Important Message (Signed)
Important Message  Patient Details  Name: Sergio Griffin MRN: [id number] Date of Birth: Jun 16, 1951   Medicare Important Message Given:  Yes-fourth notification given    Nathen May 06/20/2015, 12:16 [place] Message  Patient Details  Name: Sergio Griffin MRN: [id number] Date of Birth: [birth year]   Medicare Important Message Given:  Yes-fourth notification given    Nathen May 06/20/2015, 12:16 PM

## 2015-06-20 NOTE — Progress Notes (Signed)
PT Cancellation Note  Patient Details Name: Sergio Griffin MRN: [id number] DOB: Jan 14, 1951   Cancelled Treatment:    Reason Eval/Treat Not Completed: Patient not medically ready. Noted K+ currently 2.9 (per cardiology note goal is >4.0). Will defer until after K+ repleted.    Ruthy Forry 06/20/2015, 8:24 AM  Pager [phone]

## 2015-06-20 NOTE — Progress Notes (Signed)
[facility] Progress Note    Since last GI note: Feeling overall well.  Abd pain much improved, still a bit sore in LLQ.  NO BM today, no bleeding.  Yesterday BM with mostly gas and no overt blood.  Breathing more comfortably in bed but still DOE  Objective: Vital signs in last 24 hours: Temp:  [97.5 F (36.4 C)-98.7 F (37.1 C)] 98.7 F (37.1 C) (08/01 0716) Pulse Rate:  [75-82] 78 (08/01 0716) Resp:  [20-27] 26 (08/01 0716) BP: (95-128)/(57-66) 97/60 mmHg (08/01 0716) SpO2:  [93 %-97 %] 95 % (08/01 0716) Weight:  [292 lb 12.3 oz (132.8 kg)] 292 lb 12.3 oz (132.8 kg) (08/01 0626) Last BM Date: 06/19/15 General: alert and oriented times 3 Heart: regular rate and rythm Abdomen: soft, non-tender, non-distended, normal bowel sounds Ext: trace to 1+ pitting edema B  Lab Results:  Recent Labs  06/18/15 0950 06/19/15 0252 06/20/15 0226  WBC 16.9* 12.8* 12.7*  HGB 10.5* 10.6* 11.2*  PLT 334 298 342  MCV 89.1 87.8 88.0    Recent Labs  06/19/15 1140 06/20/15 0226  NA 135 136  K 2.6* 2.9*  CL 89* 91*  CO2 34* 37*  GLUCOSE 137* 118*  BUN 9 12  CREATININE 1.52* 1.58*  CALCIUM 7.6* 7.8*   Medications: Scheduled Meds: . antiseptic oral rinse  7 mL Mouth Rinse q12n4p  . aspirin EC  162 mg Oral Daily  . buPROPion  300 mg Oral QHS  . chlorhexidine  15 mL Mouth Rinse BID  . ciprofloxacin  400 mg Intravenous Q12H  . clonazePAM  0.5 mg Oral BID  . ipratropium-albuterol  3 mL Nebulization Once  . latanoprost  1 drop Both Eyes QHS  . metoprolol tartrate  12.5 mg Oral BID  . metroNIDAZOLE  500 mg Intravenous 4 times per day  . nitroGLYCERIN  1 inch Topical 4 times per day  . omega-3 acid ethyl esters  1 g Oral Daily  . pantoprazole  40 mg Oral BID  . rosuvastatin  20 mg Oral QPM   Continuous Infusions: . furosemide (LASIX) infusion 10 mg/hr (06/19/15 2129)   PRN Meds:.acetaminophen, hydrALAZINE, HYDROmorphone (DILAUDID) injection, methocarbamol (ROBAXIN)   IV, metoprolol, nitroGLYCERIN, ondansetron (ZOFRAN) IV, oxyCODONE, promethazine, zolpidem    Assessment/Plan: 63 y.o. male with presumed ischemic colitis in setting of significant CHF  Clinically improving from GI standpoint.  Want to advance diet which I will do (to heart smart diet).  Should continue full 7 day course of cipro/flagyl and I will change to POs today.  We will arrange for ROV in our office in 5-7 weeks to check his clinical status, plan for colonoscopy.  Try to limit narcotics as best as possible.  Please call or page with any further questions or concerns.   Milus Banister, MD  06/20/2015, 9:58 AM [facility] Pager [phone]

## 2015-06-20 NOTE — Progress Notes (Signed)
TEAM 1 - Stepdown/ICU TEAM Progress Note  VENSON FERENCZ VPX:[id number] DOB: 02/05/1951 DOA: 06/10/2015 PCP: No primary care provider on file.  Admit HPI / Brief Narrative: 64 y.o. M Hx HTN, HLD, CAD with coronary angiogram with stenting approximately 5-6 years ago, GERD, S/P colonoscopy~2011 showing diverticulitis (reported by patient) who presented to the hospital with increasing abdominal pain, and bloody diarrhea that started the day of presentation. He has had multiple episodes of bloody diarrhea accompanied by feeling extremely ill with left-sided, substernal chest pain, diaphoresis, cold sweats, and retching.   HPI/Subjective: The patient is resting comfortably the time of my visit.  He has thus far tolerated his first regular diet without difficulty.  He denies diarrhea today.  He denies chest pain shortness breath fevers or chills.   Assessment/Plan:  Infectious Colitis / ischemic colitis / Sepsis unspecified organism -Continue Cipro and Flagyl -Stool culture negative -Clinically improving - plan is to complete 7 days of antibiotic therapy and follow up in the GI office for ultimate colonoscopy (likely in 5-7 weeks)  Elevated troponin - NSTEMI v/s demand ischemia -7/28 D/Ced heparin 2dary to continue GI bleed overnight -Troponins only very mildly elevated transiently -Continue Crestor 20 mg daily  -likely demand ischemia secondary to stress of infection -Cards would like [place] to call them to reassess once GI issues stable - I suspect outpatient clinic visit would suffice  HTN -Blood pressure currently well controlled  Pulmonary nodule (previous smoker) -6 mm pulmonary nodule in RLL -Patient previous smoker require follow-up chest CT in 6-12 months  Pulmonary edema -Now clinically stable - discontinue Lasix and follow   Acute kidney failure (no previous Cr for baseline) -Recheck creatinine in a.m. - creatinine has been fluctuating up and down -Hold all  nephrotoxic medication  Hypokalemia -Continue to replace potassium, stop Lasix, and recheck in a.m.  Acute blood loss anemia -Hemoglobin now stable with no evidence of ongoing bleeding  Code Status: FULL Family Communication: Spoke with son and daughter at bedside Disposition Plan: SDU  Consultants: Dr.Daniel Tommi Rumps (Cardiology)  Dr.Malcolm Sindy [place] (GI)  Procedures: 7/22 CT abdomen pelvis without contrast;-Probable mild thickening walls of descending colon mild colitis   infectious vs inflammatory nature. -Associated subtle inflammatory stranding W/I paracolic fat and trace free fluid in the lower left paracolic gutter. -7 mm pulmonary nodule within the RLL  -1 cm mass exophytic to the lateral cortex of the right kidney; most likely benign cyst -Left nephrolithiasis. 7/24 Echocardiogram; - Left ventricle: moderateLVH.-LVEF= 60%.  7/24 CT chest without contrast;-pleural effusions with associated compressive atelectasis. -6 mm RLL pulmonary nodule. 7/29 CT angiogram abdomen pelvis;-Mild aortoiliac plaque without significant occlusive disease. - No significant proximal mesenteric arterial or venous occlusive Dz to suggest etiology of mesenteric ischemia. - Progressive changes of colitis involving the distal transverse and descending segments, with new abdominal ascites. No perforation or abscess. - Sigmoid diverticulosis. -Left nephrolithiasis without hydronephrosis. 7/29 PCXR mild pulmonary congestion not significantly increased  Antibiotics: Ciprofloxacin 7/22 > Flagyl 7/22 >  DVT prophylaxis: SCD  Objective: Blood pressure 97/60, pulse 78, temperature 98.1 F (36.7 C), temperature source Oral, resp. rate 26, height 5\' 11"  (1.803 m), weight 132.8 kg (292 lb 12.3 oz), SpO2 95 %.  Intake/Output Summary (Last 24 hours) at 06/20/15 1528 Last data filed at 06/20/15 0626  Gross per 24 hour  Intake   1230 ml  Output   1500 ml  Net   -270 ml   Exam: General: No acute  respiratory distress  Lungs: Clear to auscultation bilaterally without wheezes or crackles Cardiovascular: Regular rate and rhythm without murmur gallop or rub normal S1 and S2 Abdomen: Nontender, obese, soft, bowel sounds positive, no rebound, no ascites, no appreciable mass Extremities: No significant cyanosis, clubbing, or edema bilateral lower extremities  Data Reviewed: Basic Metabolic Panel:  Recent Labs Lab 06/15/15 0321 06/16/15 0327 06/17/15 0310 06/18/15 0350 06/19/15 0252 06/19/15 1140 06/20/15 0226  NA 137 136 137  --   --  135 136  K 3.9 3.9 3.4*  --   --  2.6* 2.9*  CL 103 103 103  --   --  89* 91*  CO2 30 28 30   --   --  34* 37*  GLUCOSE 92 135* 132*  --   --  137* 118*  BUN 11 9 6   --   --  9 12  CREATININE 1.11 0.94 1.02  --   --  1.52* 1.58*  CALCIUM 7.8* 7.8* 7.7*  --   --  7.6* 7.8*  MG 2.1 2.2 2.3 2.2 2.1  --  2.1   Liver Function Tests: No results for input(s): AST, ALT, ALKPHOS, BILITOT, PROT, ALBUMIN in the last 168 hours. No results for input(s): LIPASE, AMYLASE in the last 168 hours. No results for input(s): AMMONIA in the last 168 hours. CBC:  Recent Labs Lab 06/17/15 0310 06/18/15 0350 06/18/15 0950 06/19/15 0252 06/20/15 0226  WBC 18.8* 16.4* 16.9* 12.8* 12.7*  NEUTROABS  --   --  12.1*  --   --   HGB 10.1* 9.9* 10.5* 10.6* 11.2*  HCT 31.6* 31.9* 33.4* 33.9* 35.1*  MCV 89.0 88.4 89.1 87.8 88.0  PLT 240 295 334 298 342   Cardiac Enzymes:  Recent Labs Lab 06/17/15 1902 06/18/15 0350 06/18/15 1200 06/18/15 1850 06/19/15 0252  TROPONINI 0.04* 0.07* 0.03 <0.03 <0.03   BNP (last 3 results)  Recent Labs  06/10/15 1225  BNP 16.0    Recent Results (from the past 240 hour(s))  MRSA PCR Screening     Status: None   Collection Time: 06/11/15  1:55 AM  Result Value Ref Range Status   MRSA by PCR NEGATIVE NEGATIVE Final    Comment:        The GeneXpert MRSA Assay (FDA approved for NASAL specimens only), is one component of  a comprehensive MRSA colonization surveillance program. It is not intended to diagnose MRSA infection nor to guide or monitor treatment for MRSA infections.   Stool culture     Status: None   Collection Time: 06/11/15  9:00 PM  Result Value Ref Range Status   Specimen Description STOOL  Final   Special Requests NONE  Final   Culture   Final    NO SALMONELLA, SHIGELLA, CAMPYLOBACTER, YERSINIA, OR E.COLI 0157:H7 ISOLATED Note: REDUCED NORMAL FLORA PRESENT Performed at [facility]    Report Status 06/15/2015 FINAL  Final  Culture, blood (routine x 2)     Status: None   Collection Time: 06/11/15  9:50 PM  Result Value Ref Range Status   Specimen Description BLOOD LEFT HAND  Final   Special Requests IN PEDIATRIC BOTTLE 2CC  Final   Culture NO GROWTH 5 DAYS  Final   Report Status 06/16/2015 FINAL  Final  Culture, blood (routine x 2)     Status: None   Collection Time: 06/11/15  9:56 PM  Result Value Ref Range Status   Specimen Description BLOOD RIGHT HAND  Final   Special Requests IN  PEDIATRIC BOTTLE 2CC  Final   Culture NO GROWTH 5 DAYS  Final   Report Status 06/16/2015 FINAL  Final  Clostridium Difficile by PCR (not at [facility])     Status: None   Collection Time: 06/18/15  1:10 PM  Result Value Ref Range Status   C difficile by pcr NEGATIVE NEGATIVE Final     Studies:  Recent x-ray studies have been reviewed in detail by the Attending Physician  Scheduled Meds:  Scheduled Meds: . antiseptic oral rinse  7 mL Mouth Rinse q12n4p  . aspirin EC  162 mg Oral Daily  . buPROPion  300 mg Oral QHS  . chlorhexidine  15 mL Mouth Rinse BID  . ciprofloxacin  500 mg Oral BID  . clonazePAM  0.5 mg Oral BID  . ipratropium-albuterol  3 mL Nebulization Once  . latanoprost  1 drop Both Eyes QHS  . metoprolol tartrate  12.5 mg Oral BID  . metroNIDAZOLE  250 mg Oral 3 times per day  . nitroGLYCERIN  1 inch Topical 4 times per day  . omega-3 acid ethyl esters  1 g Oral Daily  .  pantoprazole  40 mg Oral BID  . rosuvastatin  20 mg Oral QPM    Time spent on care of this patient: 35 mins  Cherene Altes, MD [facility] For Consults/Admissions - Flow Manager - [phone] Office  [phone]  Contact MD directly via text page:      [email or web address]      password [facility]  06/20/2015, 3:28 PM   LOS: 10 days

## 2015-06-21 ENCOUNTER — Encounter (HOSPITAL_COMMUNITY): Payer: Self-pay | Admitting: Physical Medicine and Rehabilitation

## 2015-06-21 DIAGNOSIS — R5381 Other malaise: Secondary | ICD-10-CM

## 2015-06-21 LAB — COMPREHENSIVE METABOLIC PANEL
ALT: 21 U/L (ref 17–63)
ANION GAP: 9 (ref 5–15)
AST: 26 U/L (ref 15–41)
Albumin: 2.5 g/dL — ABNORMAL LOW (ref 3.5–5.0)
Alkaline Phosphatase: 39 U/L (ref 38–126)
BILIRUBIN TOTAL: 0.5 mg/dL (ref 0.3–1.2)
BUN: 17 mg/dL (ref 6–20)
CHLORIDE: 92 mmol/L — AB (ref 101–111)
CO2: 33 mmol/L — AB (ref 22–32)
Calcium: 7.8 mg/dL — ABNORMAL LOW (ref 8.9–10.3)
Creatinine, Ser: 1.68 mg/dL — ABNORMAL HIGH (ref 0.61–1.24)
GFR calc Af Amer: 48 mL/min — ABNORMAL LOW (ref >60)
GFR calc non Af Amer: 41 mL/min — ABNORMAL LOW (ref >60)
GLUCOSE: 221 mg/dL — AB (ref 65–99)
POTASSIUM: 3.3 mmol/L — AB (ref 3.5–5.1)
SODIUM: 134 mmol/L — AB (ref 135–145)
Total Protein: 6.1 g/dL — ABNORMAL LOW (ref 6.5–8.1)

## 2015-06-21 LAB — CBC
HEMATOCRIT: 35.4 % — AB (ref 39.0–52.0)
Hemoglobin: 11.3 g/dL — ABNORMAL LOW (ref 13.0–17.0)
MCH: 28 pg (ref 26.0–34.0)
MCHC: 31.9 g/dL (ref 30.0–36.0)
MCV: 87.8 fL (ref 78.0–100.0)
Platelets: 330 10*3/uL (ref 150–400)
RBC: 4.03 MIL/uL — AB (ref 4.22–5.81)
RDW: 14.6 % (ref 11.5–15.5)
WBC: 11.1 10*3/uL — ABNORMAL HIGH (ref 4.0–10.5)

## 2015-06-21 LAB — MAGNESIUM: Magnesium: 2 mg/dL (ref 1.7–2.4)

## 2015-06-21 MED ORDER — POTASSIUM CHLORIDE CRYS ER 20 MEQ PO TBCR
60.0000 meq | EXTENDED_RELEASE_TABLET | Freq: Once | ORAL | Status: AC
  Administered 2015-06-21: 60 meq via ORAL
  Filled 2015-06-21: qty 3

## 2015-06-21 NOTE — Evaluation (Signed)
Physical Therapy Evaluation Patient Details Name: Sergio Griffin MRN: [id number] DOB: 11-Apr-1951 Today's Date: 06/21/2015   History of Present Illness  adm 7/22 abd pain, GI bleed and chest pain; elevated Troponins with ?NSTEMI vs demand ischemia; + colitis PMHx-glaucoma with Lt eye worse than Rt; CAD; MI  Clinical Impression  Pt admitted with above diagnosis. Pt reports recent functional decline PTA due to pneumonia with difficulty completing his ADLs due to respiratory status. Patient essentially lives alone (son comes home from college every other weekend) and needs to be modified independent to return home safely. Pt currently with functional limitations due to the deficits listed below (see PT Problem List).  Pt will benefit from skilled PT to increase their independence and safety with mobility to allow discharge to the venue listed below.       Follow Up Recommendations CIR    Equipment Recommendations  None recommended by PT    Recommendations for Other Services Rehab consult;OT consult     Precautions / Restrictions Precautions Precautions: Fall Precaution Comments: pt reports fall 3 mos PTA due to poor vision and tripped      Mobility  Bed Mobility Overal bed mobility: Needs Assistance Bed Mobility: Rolling;Sidelying to Sit Rolling: Modified independent (Device/Increase time) Sidelying to sit: Min guard;HOB elevated       General bed mobility comments: HOB elevated due to respiratory status; + use of rail; +dizziness upon sitting  Transfers Overall transfer level: Needs assistance Equipment used: Rolling walker (2 wheeled) Transfers: Sit to/from Stand Sit to Stand: Min assist         General transfer comment: verbal cues for safe hand placement  Patient continues to require education regarding safe and proper use of DME.  +dizziness on standing with + sway and min assist  Ambulation/Gait Ambulation/Gait assistance: Min assist Ambulation Distance (Feet):  30 Feet (seated rest, 15 ft) Assistive device: Rolling walker (2 wheeled) Gait Pattern/deviations: Step-through pattern;Decreased stride length;Shuffle;Trunk flexed   Gait velocity interpretation: Below normal speed for age/gender General Gait Details: very slow, shuffling gait; limited by dyspnea and decr muscular endurance (legs tremulous after 15 ft)  Stairs            Wheelchair Mobility    Modified Rankin (Stroke Patients Only)       Balance Overall balance assessment: Needs assistance;History of Falls Sitting-balance support: No upper extremity supported;Feet supported Sitting balance-Leahy Scale: Fair     Standing balance support: Bilateral upper extremity supported Standing balance-Leahy Scale: Poor Standing balance comment: weak, dizzy                             Pertinent Vitals/Pain On 2L O2 on arrival, SaO2 97% supine, 99% sitting Switched to room air and SaO2 99-100% while mobilizing +dyspnea and incr RR HR 96-129  Pain Assessment: No/denies pain    Home Living Family/patient expects to be discharged to:: Private residence Living Arrangements: Children (son in college; home every other weekend) Available Help at Discharge: Family;Available PRN/intermittently Type of Home: House Home Access: Stairs to enter   Entrance Stairs-Number of Steps: 1 Home Layout: Two level;1/2 bath on main level;Bed/bath upstairs (? could put bed in dining room) Home Equipment: Cane - single point;Walker - 2 wheels;Shower seat      Prior Function Level of Independence: Independent with assistive device(s)         Comments: occasional use of cane due to imbalance; fell 3 months PTA (tripped over footstool  he had moved)     Hand Dominance        Extremity/Trunk Assessment   Upper Extremity Assessment: Overall WFL for tasks assessed           Lower Extremity Assessment: Generalized weakness      Cervical / Trunk Assessment: Other exceptions  (morbid obesity)  Communication   Communication: No difficulties  Cognition Arousal/Alertness: Awake/alert Behavior During Therapy: WFL for tasks assessed/performed Overall Cognitive Status: Within Functional Limits for tasks assessed                      General Comments      Exercises General Exercises - Lower Extremity Ankle Circles/Pumps: AROM;Both;10 reps Quad Sets: AROM;Both;5 reps      Assessment/Plan    PT Assessment Patient needs continued PT services  PT Diagnosis Difficulty walking;Generalized weakness   PT Problem List Decreased strength;Decreased activity tolerance;Decreased balance;Decreased mobility;Decreased knowledge of use of DME;Cardiopulmonary status limiting activity;Obesity  PT Treatment Interventions DME instruction;Gait training;Stair training;Functional mobility training;Therapeutic activities;Therapeutic exercise;Balance training;Patient/family education   PT Goals (Current goals can be found in the Care Plan section) Acute Rehab PT Goals Patient Stated Goal: be able to care for himself  PT Goal Formulation: With patient Time For Goal Achievement: 07/05/15 Potential to Achieve Goals: Good    Frequency Min 3X/week   Barriers to discharge Decreased caregiver support lives alone with son in from college every other weekend    Co-evaluation               End of Session Equipment Utilized During Treatment: Oxygen;Gait belt Activity Tolerance: Patient limited by fatigue Patient left: in chair;with call bell/phone within reach Nurse Communication: Mobility status         Time: 9678-9381 PT Time Calculation (min) (ACUTE ONLY): 38 min   Charges:   PT Evaluation $Initial PT Evaluation Tier I: 1 Procedure PT Treatments $Gait Training: 23-37 mins   PT G Codes:        Sergio Griffin 03-Jul-2015, 11:32 AM  Pager [phone]

## 2015-06-21 NOTE — Consult Note (Signed)
Physical Medicine and Rehabilitation Consult  Reason for Consult: Debility Referring Physician: Dr. Sherral Hammers.    HPI: Sergio Griffin is a 64 y.o. male with history of HTN, diverticulosis, recent prostatitis, CAD who was admitted via OSH on 06/10/15 with abdominal pain and diarrhea due to colitis as well as CP due to NSTEMI. He was started on IV heparin and IVF for acute on chronic renal failure with lactic acidosis. 2D echo done revealing moderate LVH with EF 60%, aortic sclerosis without stenosis and no pericardial effusion. He has had sleep wake disruption with hypersomnia. He developed bloody stools and Dr. Fuller Plan questioned IBS v/s infectious v/s ischemic colitis.  CTA abdomen/pelvis was negative for mesenteric arterial/venous occlusive disease and progressive changes of colitis in distal transverse and descending segments.  Colitis felt to be ischemic and IV heparin d/c and bowel rest with improvement in symptoms. Patient with DOE due to pulmonary edema and started on gentle diuresis. PT evaluation done today and patient noted to be deconditioned. CIR recommended for follow up therapy.     Review of Systems  Constitutional: Positive for malaise/fatigue (for past 6 months).  HENT: Negative for hearing loss.   Eyes: Positive for blurred vision (limited due to glaucoma).  Respiratory: Positive for shortness of breath (dyspnea with minimal activity for  6 months).   Cardiovascular: Positive for chest pain (chest discomfort for past few months) and palpitations. Negative for leg swelling.  Gastrointestinal: Positive for abdominal pain. Negative for heartburn, nausea and diarrhea.  Genitourinary: Negative for urgency and frequency.  Musculoskeletal: Negative for myalgias and back pain.  Neurological: Positive for dizziness (with positional changes.). Negative for focal weakness and headaches.  Psychiatric/Behavioral: The patient has insomnia.       Past Medical History  Diagnosis Date    . Hypertension   . Reflux   . Glaucoma   . Hyperlipidemia   . CAD (coronary artery disease)     PCI, stent 2011  . Diverticulosis   . Gastric ulcer   . Prostatitis   . Pneumonia 11/2014    Past Surgical History  Procedure Laterality Date  . Cardiac stents    . Eye surgery      History reviewed. No pertinent family history.    Social History:  Lives alone. Medical technologist--disabled due to vision. He  reports that he has quit smoking 30 years ago. He does not have any smokeless tobacco history on file. He reports that he does not drink alcohol. His drug history is not on file.    Allergies  Allergen Reactions  . Sulfa Antibiotics Itching    Medications Prior to Admission  Medication Sig Dispense Refill  . aspirin EC 81 MG tablet Take 162 mg by mouth daily.    Marland Kitchen buPROPion (WELLBUTRIN XL) 300 MG 24 hr tablet Take 300 mg by mouth at bedtime.    . clonazePAM (KLONOPIN) 0.5 MG tablet Take 0.5 mg by mouth 2 (two) times daily.    Marland Kitchen latanoprost (XALATAN) 0.005 % ophthalmic solution Place 1 drop into both eyes at bedtime.    Marland Kitchen losartan (COZAAR) 50 MG tablet Take 50 mg by mouth daily.    . metoprolol succinate (TOPROL-XL) 25 MG 24 hr tablet Take 25 mg by mouth daily.    . Omega-3 Fatty Acids (FISH OIL) 1000 MG CAPS Take 1,000 mg by mouth daily.    . promethazine (PHENERGAN) 25 MG tablet Take 25 mg by mouth every 6 (six) hours as needed for  nausea or vomiting.    . rosuvastatin (CRESTOR) 20 MG tablet Take 20 mg by mouth every evening.    . sucralfate (CARAFATE) 1 G tablet Take 1 g by mouth 4 (four) times daily -  with meals and at bedtime.    . Vitamin D, Cholecalciferol, 1000 UNITS TABS Take 1,000 mg by mouth daily.      Home: Home Living Family/patient expects to be discharged to:: Private residence Living Arrangements: Children (son in college; home every other weekend) Available Help at Discharge: Family, Available PRN/intermittently Type of Home: House Home Access:  Stairs to enter Technical brewer of Steps: 1 Home Layout: Two level, 1/2 bath on main level, Bed/bath upstairs (? could put bed in dining room) Alternate Level Stairs-Number of Steps: 14 (2 landings; ) Alternate Level Stairs-Rails: Right Bathroom Shower/Tub: Tub/shower unit, Architectural technologist: Standard (std downstairs; comfort height upstairs) Bathroom Accessibility:  (unsure) Home Equipment: Cane - single point, Environmental consultant - 2 wheels, Shower seat  Functional History: Prior Function Level of Independence: Independent with assistive device(s) Comments: occasional use of cane due to imbalance; fell 3 months PTA (tripped over footstool he had moved) Functional Status:  Mobility: Bed Mobility Overal bed mobility: Needs Assistance Bed Mobility: Rolling, Sidelying to Sit Rolling: Modified independent (Device/Increase time) Sidelying to sit: Min guard, HOB elevated General bed mobility comments: HOB elevated due to respiratory status; + use of rail; +dizziness upon sitting Transfers Overall transfer level: Needs assistance Equipment used: Rolling walker (2 wheeled) Transfers: Sit to/from Stand Sit to Stand: Min assist General transfer comment: ; +dizziness on standing with + sway and min assist Ambulation/Gait Ambulation/Gait assistance: Min assist Ambulation Distance (Feet): 30 Feet (seated rest, 15 ft) Assistive device: Rolling walker (2 wheeled) Gait Pattern/deviations: Step-through pattern, Decreased stride length, Shuffle, Trunk flexed General Gait Details: very slow, shuffling gait; limited by dyspnea and decr muscular endurance (legs tremulous after 15 ft) Gait velocity interpretation: Below normal speed for age/gender    ADL:    Cognition: Cognition Overall Cognitive Status: Within Functional Limits for tasks assessed Orientation Level: Oriented X4 Cognition Arousal/Alertness: Awake/alert Behavior During Therapy: WFL for tasks assessed/performed Overall  Cognitive Status: Within Functional Limits for tasks assessed  Blood pressure 109/77, pulse 81, temperature 98.9 F (37.2 C), temperature source Oral, resp. rate 31, height 5\' 11"  (1.803 m), weight 128.368 kg (283 lb), SpO2 96 %. Physical Exam  Nursing note and vitals reviewed. Constitutional: He is oriented to person, place, and time. He appears well-developed and well-nourished.  HENT:  Head: Normocephalic and atraumatic.  Eyes: Conjunctivae are normal. Pupils are equal, round, and reactive to light.  Neck: Normal range of motion. Neck supple.  Cardiovascular: Normal rate and regular rhythm.   Respiratory: Effort normal and breath sounds normal. No respiratory distress. He has no wheezes.  GI: Soft. Bowel sounds are normal. He exhibits no distension. There is no tenderness.  Musculoskeletal: He exhibits no edema or tenderness.  Neurological: He is alert and oriented to person, place, and time.  Speech clear. Follows commands without difficulty. BUE with intentional tremors. UE strength grossly 4+ to 5/5. LE: 4/5 prox to 5/5 distally. No sensory findings. Cognitively appropriate  Skin: Skin is warm and dry.  Psychiatric: He has a normal mood and affect. His behavior is normal. Judgment and thought content normal.    Results for orders placed or performed during the hospital encounter of 06/10/15 (from the past 24 hour(s))  Comprehensive metabolic panel     Status: Abnormal  Collection Time: 06/21/15  2:10 AM  Result Value Ref Range   Sodium 134 (L) 135 - 145 mmol/L   Potassium 3.3 (L) 3.5 - 5.1 mmol/L   Chloride 92 (L) 101 - 111 mmol/L   CO2 33 (H) 22 - 32 mmol/L   Glucose, Bld 221 (H) 65 - 99 mg/dL   BUN 17 6 - 20 mg/dL   Creatinine, Ser 1.68 (H) 0.61 - 1.24 mg/dL   Calcium 7.8 (L) 8.9 - 10.3 mg/dL   Total Protein 6.1 (L) 6.5 - 8.1 g/dL   Albumin 2.5 (L) 3.5 - 5.0 g/dL   AST 26 15 - 41 U/L   ALT 21 17 - 63 U/L   Alkaline Phosphatase 39 38 - 126 U/L   Total Bilirubin 0.5 0.3 -  1.2 mg/dL   GFR calc non Af Amer 41 (L) >60 mL/min   GFR calc Af Amer 48 (L) >60 mL/min   Anion gap 9 5 - 15  CBC     Status: Abnormal   Collection Time: 06/21/15  2:10 AM  Result Value Ref Range   WBC 11.1 (H) 4.0 - 10.5 K/uL   RBC 4.03 (L) 4.22 - 5.81 MIL/uL   Hemoglobin 11.3 (L) 13.0 - 17.0 g/dL   HCT 35.4 (L) 39.0 - 52.0 %   MCV 87.8 78.0 - 100.0 fL   MCH 28.0 26.0 - 34.0 pg   MCHC 31.9 30.0 - 36.0 g/dL   RDW 14.6 11.5 - 15.5 %   Platelets 330 150 - 400 K/uL  Magnesium     Status: None   Collection Time: 06/21/15  2:10 AM  Result Value Ref Range   Magnesium 2.0 1.7 - 2.4 mg/dL   No results found.  Assessment/Plan: Diagnosis: debility after multiple medical 1. Does the need for close, 24 hr/day medical supervision in concert with the patient's rehab needs make it unreasonable for this patient to be served in a less intensive setting? Yes and Potentially 2. Co-Morbidities requiring supervision/potential complications: CAD, colitis, htn, a/ckd 3. Due to bladder management, bowel management, safety, skin/wound care, disease management, medication administration, pain management and patient education, does the patient require 24 hr/day rehab nursing? Yes and Potentially 4. Does the patient require coordinated care of a physician, rehab nurse, PT (1-2 hrs/day, 5 days/week) and OT (1-2 hrs/day, 5 days/week) to address physical and functional deficits in the context of the above medical diagnosis(es)? Yes and Potentially Addressing deficits in the following areas: balance, endurance, locomotion, strength, transferring, bowel/bladder control, bathing, dressing, feeding, grooming, toileting and psychosocial support 5. Can the patient actively participate in an intensive therapy program of at least 3 hrs of therapy per day at least 5 days per week? Yes 6. The potential for patient to make measurable gains while on inpatient rehab is good 7. Anticipated functional outcomes upon discharge from  inpatient rehab are modified independent  with PT, modified independent with OT, modified independent with SLP. 8. Estimated rehab length of stay to reach the above functional goals is: 7 days potentially 9. Does the patient have adequate social supports and living environment to accommodate these discharge functional goals? Potentially 10. Anticipated D/C setting: Home 11. Anticipated post D/C treatments: [facility] therapy 12. Overall Rehab/Functional Prognosis: excellent  RECOMMENDATIONS: This patient's condition is appropriate for continued rehabilitative care in the following setting: CIR vs HH therapies Patient has agreed to participate in recommended program. Potentially Note that insurance prior authorization may be required for reimbursement for recommended care.  Comment: Pt  beginning to make medical and functional progress. Would like to see performance with therapies tomorrow before advising inpatient rehab. Rehab Admissions Coordinator to follow up.  Thanks,  Meredith Staggers, MD, Mellody Drown     06/21/2015

## 2015-06-21 NOTE — Progress Notes (Signed)
Physical Therapy  Full evaluation note to follow. Pt very deconditioned, weak with walking short distances, dyspneic and lives alone (except son comes home from college every other weekend). Pt has had multiple health issues over past several months with deconditioned stated PTA. Recommend CIR consult for intensive therapies prior to d/c home alone.   06/21/2015 Barry Brunner, PT Pager: [phone]

## 2015-06-21 NOTE — Progress Notes (Signed)
[facility] TEAM 1 - Stepdown/ICU TEAM Progress Note  Sergio Griffin:[id number] DOB: 10/20/1951 DOA: 06/10/2015 PCP: No primary care provider on file.  Admit HPI / Brief Narrative: 64 y.o. WM PMHx HTN, HLD, CAD with coronary angiogram with stenting approximately 5-6 years ago, GERD, S/P colonoscopy~2011 showing diverticulitis (reported by patient).   Cardiologist up in [place] that he follows with yearly. He presents to the hospital with increasing abdominal pain, bloody diarrhea that started at approximately 1 this morning. He has had multiple episodes of bloody diarrhea that has progressed throughout the morning, accompanied by feeling extremely ill with left-sided, substernal chest pain, diaphoresis, cold sweats, and retching. At approximately 11 AM, the patient decided to present to the emergency room for evaluation. He has had no bloody diarrhea since that time.  With his chest pain, the patient has had left-sided, substernal chest pain doubt radiation, associated with diaphoresis, cold sweats, nausea. He had the chest pain for approximately 6-7 hours, but this has gradually trailed off since being at the hospital. He currently reports no chest pain.    HPI/Subjective: 8/2  A/O 4,negative  SOB, negative CP, negative N/V, negative abdominal pain. States last bloody stools 48 hours ago.      Assessment/Plan: NSTEMI - Admitting physician discussed this patient with Dr. Tommi Rumps of (cardiology) recommended starting anticoagulation. -7/28 D/Ced heparin 2dary to continue GI bleed overnight..   -Continue Crestor 20 mg daily per cardiology (cardioprotective) -Strict I&O; since admission + 4.4 L -Daily weight                                 8/2 bed= 128.3 kg -Cards would like [place] to call them to reassess once GI issues stable - I suspect outpatient clinic visit would suffice -CIR in the a.m.  HTN -Clonazepam 0.5 mg BID -Continue metoprolol 12.5 mg BID -Start hydralazine IV 5 mg PRN  SBP> 160 or DBP> 100  Pulmonary nodule (previous smoker) -6 mm pulmonary nodule in RLL -Patient previous smoker require follow-up chest CT in 6-12 months  Pulmonary edema -Resolved   Infectious Colitis/ischemic colitis/Sepsis unspecified organism -8/2 WBC trending down -Continue Cipro and Flagyl -Stool culture negative -continue Protonix PO 40 mg BID -7/29 Repeat CT abdomen pelvis; showing worsening colitis;  spoke with Dr.Malcolm T Fuller Plan (GI), and he feels we may have to reluctantly perform colonoscopy, but would like patient to be more stable. -GI will see patient in 5-7 weeks to check his clinical status, plan for colonoscopy  Acute on Chronic kidney failure stage II (no previous Cr for baseline) -Cr trending up, if continues to trend up would restart hydration; doubt patient is consuming enough fluids to keep up demand -Hold all nephrotoxic medication  Hypokalemia -Potassium goal> 4 -Potassium 50 meq x1  Acute blood loss anemia -May be stabilizing     Code Status: FULL Family Communication: None present at time of exam Disposition Plan: Per cardiology    Consultants: Dr.Daniel Tommi Rumps (cardiology)  Dr.Malcolm Sindy [place] (GI)    Procedure/Significant Events: 7/22 CT abdomen pelvis without contrast;-Probable mild thickening walls of descending colon mild colitis   infectious vs inflammatory nature. -Associated subtle inflammatory stranding W/I paracolic fat and trace free fluid in the lower left paracolic gutter. -7 mm pulmonary nodule within the RLL  -1 cm mass exophytic to the lateral cortex of the right kidney; most likely benign cyst -Left nephrolithiasis. 7/24 Echocardiogram; - Left ventricle: moderateLVH.-LVEF= 60%.  7/24 CT chest without contrast;-pleural effusions with associated compressive atelectasis. -6 mm RLL pulmonary nodule. 7/29 CT angiogram abdomen pelvis;-Mild aortoiliac plaque without significant occlusive disease. - No significant proximal  mesenteric arterial or venous occlusive Dz to suggest etiology of mesenteric ischemia. - Progressive changes of colitis involving the distal transverse and descending segments, with new abdominal ascites. No perforation or abscess. - Sigmoid diverticulosis. -Left nephrolithiasis without hydronephrosis. 7/29 PCXR; shows mild pulmonary congestion not significantly increased    Culture 7/22 urine negative  7/23 blood left/right hand NGTD 7/23 MRSA by PCR negative 7/23 stool Negative 7/30 C. difficile by PCR negative   Antibiotics: Ciprofloxacin 7/22>> Flagyl 7/22>>  DVT prophylaxis: SCD  Devices    LINES / TUBES:      Continuous Infusions:    Objective: VITAL SIGNS: Temp: 98.4 F (36.9 C) (08/02 1200) Temp Source: Oral (08/02 1200) BP: 109/77 mmHg (08/02 0706) Pulse Rate: 81 (08/02 0706) SPO2; FIO2:   Intake/Output Summary (Last 24 hours) at 06/21/15 1547 Last data filed at 06/21/15 1200  Gross per 24 hour  Intake    240 ml  Output   1250 ml  Net  -1010 ml     Exam: General:  A/O 4, NAD, negative respiratory distress Eyes: Negative headache, eye pain, double vision, negative scleral hemorrhage ENT: Negative Runny nose, negative ear pain, negative tinnitus, negative gingival bleeding Neck:  Negative scars, masses, torticollis, lymphadenopathy, JVD Lungs: Clear to auscultation bilaterally without wheezes or crackles Cardiovascular: Regular rate and rhythm without murmur gallop or rub normal S1 and S2 Abdomen:  Morbidly obese, negative abdominal pain, positive bowel sounds, no rebound, no ascites, no appreciable mass Extremities: No significant cyanosis, clubbing, or edema bilateral lower extremities Psychiatric:  Negative depression, negative anxiety, negative fatigue, negative mania  Neurologic:  Cranial nerves II through XII intact, tongue/uvula midline, all extremities muscle strength 5/5, sensation intact throughout,  negative dysarthria, negative  expressive aphasia, negative receptive aphasia.     Data Reviewed: Basic Metabolic Panel:  Recent Labs Lab 06/16/15 0327 06/17/15 0310 06/18/15 0350 06/19/15 0252 06/19/15 1140 06/20/15 0226 06/21/15 0210  NA 136 137  --   --  135 136 134*  K 3.9 3.4*  --   --  2.6* 2.9* 3.3*  CL 103 103  --   --  89* 91* 92*  CO2 28 30  --   --  34* 37* 33*  GLUCOSE 135* 132*  --   --  137* 118* 221*  BUN 9 6  --   --  9 12 17   CREATININE 0.94 1.02  --   --  1.52* 1.58* 1.68*  CALCIUM 7.8* 7.7*  --   --  7.6* 7.8* 7.8*  MG 2.2 2.3 2.2 2.1  --  2.1 2.0   Liver Function Tests:  Recent Labs Lab 06/21/15 0210  AST 26  ALT 21  ALKPHOS 39  BILITOT 0.5  PROT 6.1*  ALBUMIN 2.5*   No results for input(s): LIPASE, AMYLASE in the last 168 hours. No results for input(s): AMMONIA in the last 168 hours. CBC:  Recent Labs Lab 06/18/15 0350 06/18/15 0950 06/19/15 0252 06/20/15 0226 06/21/15 0210  WBC 16.4* 16.9* 12.8* 12.7* 11.1*  NEUTROABS  --  12.1*  --   --   --   HGB 9.9* 10.5* 10.6* 11.2* 11.3*  HCT 31.9* 33.4* 33.9* 35.1* 35.4*  MCV 88.4 89.1 87.8 88.0 87.8  PLT 295 334 298 342 330   Cardiac Enzymes:  Recent Labs Lab  06/17/15 1902 06/18/15 0350 06/18/15 1200 06/18/15 1850 06/19/15 0252  TROPONINI 0.04* 0.07* 0.03 <0.03 <0.03   BNP (last 3 results)  Recent Labs  06/10/15 1225  BNP 16.0    ProBNP (last 3 results) No results for input(s): PROBNP in the last 8760 hours.  CBG: No results for input(s): GLUCAP in the last 168 hours.  Recent Results (from the past 240 hour(s))  Stool culture     Status: None   Collection Time: 06/11/15  9:00 PM  Result Value Ref Range Status   Specimen Description STOOL  Final   Special Requests NONE  Final   Culture   Final    NO SALMONELLA, SHIGELLA, CAMPYLOBACTER, YERSINIA, OR E.COLI 0157:H7 ISOLATED Note: REDUCED NORMAL FLORA PRESENT Performed at [facility]    Report Status 06/15/2015 FINAL  Final  Culture,  blood (routine x 2)     Status: None   Collection Time: 06/11/15  9:50 PM  Result Value Ref Range Status   Specimen Description BLOOD LEFT HAND  Final   Special Requests IN PEDIATRIC BOTTLE 2CC  Final   Culture NO GROWTH 5 DAYS  Final   Report Status 06/16/2015 FINAL  Final  Culture, blood (routine x 2)     Status: None   Collection Time: 06/11/15  9:56 PM  Result Value Ref Range Status   Specimen Description BLOOD RIGHT HAND  Final   Special Requests IN PEDIATRIC BOTTLE 2CC  Final   Culture NO GROWTH 5 DAYS  Final   Report Status 06/16/2015 FINAL  Final  Clostridium Difficile by PCR (not at [facility])     Status: None   Collection Time: 06/18/15  1:10 PM  Result Value Ref Range Status   Toxigenic C Difficile by pcr NEGATIVE NEGATIVE Final     Studies:  Recent x-ray studies have been reviewed in detail by the Attending Physician  Scheduled Meds:  Scheduled Meds: . antiseptic oral rinse  7 mL Mouth Rinse q12n4p  . aspirin EC  162 mg Oral Daily  . buPROPion  300 mg Oral QHS  . chlorhexidine  15 mL Mouth Rinse BID  . ciprofloxacin  500 mg Oral BID  . clonazePAM  0.5 mg Oral BID  . ipratropium-albuterol  3 mL Nebulization Once  . latanoprost  1 drop Both Eyes QHS  . metoprolol tartrate  12.5 mg Oral BID  . metroNIDAZOLE  250 mg Oral 3 times per day  . omega-3 acid ethyl esters  1 g Oral Daily  . pantoprazole  40 mg Oral BID  . rosuvastatin  20 mg Oral QPM    Time spent on care of this patient: 40 mins   WOODS, Geraldo Docker , MD  [facility] Office  [phone] Pager [phone]  On-Call/Text Page:      [email or web address]      password [email or web address]  If 7PM-7AM, please contact night-coverage [email or web address] Password [email or web address] 06/21/2015, 3:47 PM   LOS: 11 days   Care during the described time interval was provided by me .  I have reviewed this patient's available data, including medical history, events of note, physical examination, and all test results as part of my evaluation. I  have personally reviewed and interpreted all radiology studies.   Dia Crawford, MD [phone] Pager

## 2015-06-21 NOTE — Progress Notes (Addendum)
I will follow up with pt tomorrow to see if an inpatient rehab admission can be arranged. I will discuss with Dr. Naaman Plummer after therapy session tomorrow. [phone] I have requested OT eval for the a.m.

## 2015-06-21 NOTE — Progress Notes (Signed)
Inpatient Rehabilitation  We have received a prescreen request  and a consult has been ordered.  The admissions coordinator will follow up once the consult has been completed by the PMR MD.  Thanks,  Alcan Border Admissions Coordinator Cell [phone] Office [phone]

## 2015-06-22 ENCOUNTER — Inpatient Hospital Stay (HOSPITAL_COMMUNITY)
Admission: RE | Admit: 2015-06-22 | Discharge: 2015-06-30 | DRG: 947 | Disposition: A | Discharge status: Home / Self Care | Payer: Medicare Other | Source: Intra-hospital | Attending: Physical Medicine & Rehabilitation | Admitting: Physical Medicine & Rehabilitation

## 2015-06-22 DIAGNOSIS — H409 Unspecified glaucoma: Secondary | ICD-10-CM | POA: Diagnosis not present

## 2015-06-22 DIAGNOSIS — F329 Major depressive disorder, single episode, unspecified: Secondary | ICD-10-CM | POA: Diagnosis not present

## 2015-06-22 DIAGNOSIS — Z955 Presence of coronary angioplasty implant and graft: Secondary | ICD-10-CM

## 2015-06-22 DIAGNOSIS — E8770 Fluid overload, unspecified: Secondary | ICD-10-CM

## 2015-06-22 DIAGNOSIS — Z79899 Other long term (current) drug therapy: Secondary | ICD-10-CM

## 2015-06-22 DIAGNOSIS — R911 Solitary pulmonary nodule: Secondary | ICD-10-CM | POA: Diagnosis present

## 2015-06-22 DIAGNOSIS — R5381 Other malaise: Principal | ICD-10-CM | POA: Diagnosis present

## 2015-06-22 DIAGNOSIS — I872 Venous insufficiency (chronic) (peripheral): Secondary | ICD-10-CM

## 2015-06-22 DIAGNOSIS — E785 Hyperlipidemia, unspecified: Secondary | ICD-10-CM | POA: Diagnosis not present

## 2015-06-22 DIAGNOSIS — K559 Vascular disorder of intestine, unspecified: Secondary | ICD-10-CM | POA: Diagnosis not present

## 2015-06-22 DIAGNOSIS — Z7982 Long term (current) use of aspirin: Secondary | ICD-10-CM

## 2015-06-22 DIAGNOSIS — N179 Acute kidney failure, unspecified: Secondary | ICD-10-CM | POA: Diagnosis not present

## 2015-06-22 DIAGNOSIS — N189 Chronic kidney disease, unspecified: Secondary | ICD-10-CM | POA: Diagnosis not present

## 2015-06-22 DIAGNOSIS — K5900 Constipation, unspecified: Secondary | ICD-10-CM

## 2015-06-22 DIAGNOSIS — I129 Hypertensive chronic kidney disease with stage 1 through stage 4 chronic kidney disease, or unspecified chronic kidney disease: Secondary | ICD-10-CM | POA: Diagnosis not present

## 2015-06-22 DIAGNOSIS — F419 Anxiety disorder, unspecified: Secondary | ICD-10-CM

## 2015-06-22 DIAGNOSIS — I2510 Atherosclerotic heart disease of native coronary artery without angina pectoris: Secondary | ICD-10-CM | POA: Diagnosis present

## 2015-06-22 DIAGNOSIS — I10 Essential (primary) hypertension: Secondary | ICD-10-CM | POA: Diagnosis present

## 2015-06-22 DIAGNOSIS — I214 Non-ST elevation (NSTEMI) myocardial infarction: Secondary | ICD-10-CM | POA: Diagnosis not present

## 2015-06-22 DIAGNOSIS — R7989 Other specified abnormal findings of blood chemistry: Secondary | ICD-10-CM | POA: Diagnosis present

## 2015-06-22 DIAGNOSIS — D62 Acute posthemorrhagic anemia: Secondary | ICD-10-CM | POA: Diagnosis present

## 2015-06-22 DIAGNOSIS — R778 Other specified abnormalities of plasma proteins: Secondary | ICD-10-CM | POA: Diagnosis present

## 2015-06-22 DIAGNOSIS — Z87891 Personal history of nicotine dependence: Secondary | ICD-10-CM | POA: Diagnosis not present

## 2015-06-22 HISTORY — DX: Depression, unspecified: F32.A

## 2015-06-22 HISTORY — DX: Major depressive disorder, single episode, unspecified: F32.9

## 2015-06-22 LAB — BASIC METABOLIC PANEL
Anion gap: 9 (ref 5–15)
BUN: 20 mg/dL (ref 6–20)
CO2: 30 mmol/L (ref 22–32)
Calcium: 9 mg/dL (ref 8.9–10.3)
Chloride: 98 mmol/L — ABNORMAL LOW (ref 101–111)
Creatinine, Ser: 1.34 mg/dL — ABNORMAL HIGH (ref 0.61–1.24)
GFR calc Af Amer: >60 mL/min (ref >60)
GFR, EST NON AFRICAN AMERICAN: 54 mL/min — AB (ref >60)
GLUCOSE: 125 mg/dL — AB (ref 65–99)
Potassium: 4.4 mmol/L (ref 3.5–5.1)
Sodium: 137 mmol/L (ref 135–145)

## 2015-06-22 LAB — CBC
HEMATOCRIT: 34.7 % — AB (ref 39.0–52.0)
Hemoglobin: 10.8 g/dL — ABNORMAL LOW (ref 13.0–17.0)
MCH: 27.3 pg (ref 26.0–34.0)
MCHC: 31.1 g/dL (ref 30.0–36.0)
MCV: 87.8 fL (ref 78.0–100.0)
Platelets: 378 10*3/uL (ref 150–400)
RBC: 3.95 MIL/uL — ABNORMAL LOW (ref 4.22–5.81)
RDW: 14.8 % (ref 11.5–15.5)
WBC: 10.4 10*3/uL (ref 4.0–10.5)

## 2015-06-22 LAB — MAGNESIUM: Magnesium: 2.4 mg/dL (ref 1.7–2.4)

## 2015-06-22 MED ORDER — PANTOPRAZOLE SODIUM 40 MG PO TBEC
40.0000 mg | DELAYED_RELEASE_TABLET | Freq: Two times a day (BID) | ORAL | Status: DC
  Administered 2015-06-22 – 2015-06-30 (×16): 40 mg via ORAL
  Filled 2015-06-22 (×16): qty 1

## 2015-06-22 MED ORDER — SENNOSIDES-DOCUSATE SODIUM 8.6-50 MG PO TABS
2.0000 | ORAL_TABLET | Freq: Every day | ORAL | Status: DC
  Administered 2015-06-22 – 2015-06-29 (×8): 2 via ORAL
  Filled 2015-06-22 (×8): qty 2

## 2015-06-22 MED ORDER — GUAIFENESIN-DM 100-10 MG/5ML PO SYRP: 5.0000 mL | ORAL_SOLUTION | Freq: Four times a day (QID) | ORAL | Status: DC | PRN

## 2015-06-22 MED ORDER — CIPROFLOXACIN HCL 500 MG PO TABS
500.0000 mg | ORAL_TABLET | Freq: Two times a day (BID) | ORAL | Status: DC
  Administered 2015-06-22 – 2015-06-27 (×11): 500 mg via ORAL
  Filled 2015-06-22 (×12): qty 1

## 2015-06-22 MED ORDER — ALUM & MAG HYDROXIDE-SIMETH 200-200-20 MG/5ML PO SUSP: 30.0000 mL | ORAL | Status: DC | PRN

## 2015-06-22 MED ORDER — BISACODYL 10 MG RE SUPP
10.0000 mg | Freq: Every day | RECTAL | Status: DC | PRN
  Filled 2015-06-22: qty 1

## 2015-06-22 MED ORDER — NITROGLYCERIN 0.4 MG SL SUBL: 0.4000 mg | SUBLINGUAL_TABLET | SUBLINGUAL | Status: DC | PRN

## 2015-06-22 MED ORDER — ONDANSETRON HCL 4 MG/2ML IJ SOLN: 4.0000 mg | Freq: Four times a day (QID) | INTRAMUSCULAR | Status: DC | PRN

## 2015-06-22 MED ORDER — TRAZODONE HCL 50 MG PO TABS: 25.0000 mg | ORAL_TABLET | Freq: Every evening | ORAL | Status: DC | PRN

## 2015-06-22 MED ORDER — CLONAZEPAM 0.5 MG PO TABS
0.5000 mg | ORAL_TABLET | Freq: Two times a day (BID) | ORAL | Status: DC
  Administered 2015-06-22 – 2015-06-30 (×16): 0.5 mg via ORAL
  Filled 2015-06-22 (×16): qty 1

## 2015-06-22 MED ORDER — LATANOPROST 0.005 % OP SOLN
1.0000 [drp] | Freq: Every day | OPHTHALMIC | Status: DC
  Administered 2015-06-22 – 2015-06-29 (×8): 1 [drp] via OPHTHALMIC
  Filled 2015-06-22 (×2): qty 2.5

## 2015-06-22 MED ORDER — METRONIDAZOLE 500 MG PO TABS
250.0000 mg | ORAL_TABLET | Freq: Three times a day (TID) | ORAL | Status: DC
  Administered 2015-06-22 – 2015-06-28 (×17): 250 mg via ORAL
  Filled 2015-06-22 (×12): qty 1
  Filled 2015-06-22: qty 0.5
  Filled 2015-06-22 (×4): qty 1

## 2015-06-22 MED ORDER — ENOXAPARIN SODIUM 40 MG/0.4ML ~~LOC~~ SOLN
40.0000 mg | SUBCUTANEOUS | Status: DC
  Administered 2015-06-22 – 2015-06-29 (×8): 40 mg via SUBCUTANEOUS
  Filled 2015-06-22 (×8): qty 0.4

## 2015-06-22 MED ORDER — IPRATROPIUM-ALBUTEROL 0.5-2.5 (3) MG/3ML IN SOLN: 3.0000 mL | RESPIRATORY_TRACT | Status: DC | PRN

## 2015-06-22 MED ORDER — ASPIRIN EC 81 MG PO TBEC
162.0000 mg | DELAYED_RELEASE_TABLET | Freq: Every day | ORAL | Status: DC
  Administered 2015-06-23 – 2015-06-30 (×8): 162 mg via ORAL
  Filled 2015-06-22 (×11): qty 2

## 2015-06-22 MED ORDER — ACETAMINOPHEN 325 MG PO TABS: 650.0000 mg | ORAL_TABLET | ORAL | Status: DC | PRN

## 2015-06-22 MED ORDER — CETYLPYRIDINIUM CHLORIDE 0.05 % MT LIQD
7.0000 mL | Freq: Two times a day (BID) | OROMUCOSAL | Status: DC
  Administered 2015-06-23 – 2015-06-29 (×10): 7 mL via OROMUCOSAL

## 2015-06-22 MED ORDER — BUPROPION HCL ER (XL) 300 MG PO TB24
300.0000 mg | ORAL_TABLET | Freq: Every day | ORAL | Status: DC
  Administered 2015-06-22 – 2015-06-29 (×8): 300 mg via ORAL
  Filled 2015-06-22 (×9): qty 1

## 2015-06-22 MED ORDER — DIPHENHYDRAMINE HCL 12.5 MG/5ML PO ELIX: 12.5000 mg | ORAL_SOLUTION | Freq: Four times a day (QID) | ORAL | Status: DC | PRN

## 2015-06-22 MED ORDER — ONDANSETRON HCL 4 MG PO TABS: 4.0000 mg | ORAL_TABLET | Freq: Four times a day (QID) | ORAL | Status: DC | PRN

## 2015-06-22 MED ORDER — FLEET ENEMA 7-19 GM/118ML RE ENEM: 1.0000 | ENEMA | Freq: Once | RECTAL | Status: AC | PRN

## 2015-06-22 MED ORDER — ROSUVASTATIN CALCIUM 20 MG PO TABS
20.0000 mg | ORAL_TABLET | Freq: Every evening | ORAL | Status: DC
  Administered 2015-06-23 – 2015-06-29 (×7): 20 mg via ORAL
  Filled 2015-06-22 (×7): qty 1

## 2015-06-22 MED ORDER — METOPROLOL TARTRATE 12.5 MG HALF TABLET
12.5000 mg | ORAL_TABLET | Freq: Two times a day (BID) | ORAL | Status: DC
  Administered 2015-06-22 – 2015-06-30 (×16): 12.5 mg via ORAL
  Filled 2015-06-22 (×17): qty 1

## 2015-06-22 MED ORDER — OMEGA-3-ACID ETHYL ESTERS 1 G PO CAPS
1.0000 g | ORAL_CAPSULE | Freq: Every day | ORAL | Status: DC
  Administered 2015-06-23 – 2015-06-30 (×8): 1 g via ORAL
  Filled 2015-06-22 (×9): qty 1

## 2015-06-22 NOTE — PMR Pre-admission (Signed)
PMR Admission Coordinator Pre-Admission Assessment  Patient: Sergio Griffin is an 64 y.o., male MRN: [id number] DOB: 03/05/1951 Height: 5\' 11"  (180.3 cm) Weight: 128 kg (282 lb 3 oz)              Insurance Information HMO:     PPO:      PCP:      IPA:      80/20: yes     OTHER: no HMO PRIMARY: Medicare a and b      Policy#: [id number]      Subscriber: pt Benefits:  Phone #: [email or web address] online     Name: 06/22/2015 Eff. Date: 9/1/ 2010     Deduct: $1288      Out of Pocket Max: none      Life Max: none CIR: 100%      SNF: 20 full days Outpatient: 80%     Co-Pay: 20% Home Health: 100%      Co-Pay: none DME: 80%     Co-Pay: 20% Providers: pt choice  SECONDARY: none       Medicaid Application Date:       Case Manager:  Disability Application Date:       Case Worker:   Emergency Contact Information Contact Information    Name Relation Home Work Mobile   [place] Son [place] Daughter   [phone]     Current Medical History  Patient Admitting Diagnosis: debility after multiple medical  History of Present Illness:Sergio Griffin is a 64 y.o. male with history of HTN, diverticulosis, morbid obesity, recent prostatitis, CAD who was admitted via OSH on 06/10/15 with abdominal pain and diarrhea due to colitis as well as CP due to NSTEMI. He was started on IV heparin and IVF for acute on chronic renal failure with lactic acidosis. 2D echo done revealing moderate LVH with EF 60%, aortic sclerosis without stenosis and no pericardial effusion. He has had sleep wake disruption with hypersomnia. He developed bloody stools and Dr. Fuller Plan questioned IBS v/s infectious v/s ischemic colitis. CTA abdomen/pelvis was negative for mesenteric arterial/venous occlusive disease and progressive changes of colitis in distal transverse and descending segments. Colitis felt to be ischemic and IV heparin d/c and patient placed on bowel rest with improvement in symptoms. Patient developed DOE  due to pulmonary edema and was started on gentle diuresis with improvement in respiratory status.   Plan is to complete 7 days of antibiotic therapy for colitis and follow up in the GI office for ultimate colonoscopy  Likely in 5-7 weeks.  Elevated troponin felt to be NSTEMI vs demand ischemia related to stress of infection. Cardiology would like to be contacted them to reassess once GI issues stable and likely to see outpatient clinic after discharge.  Past Medical History  Past Medical History  Diagnosis Date  . Hypertension   . Reflux   . Glaucoma   . Hyperlipidemia   . CAD (coronary artery disease)     PCI, stent 2011  . Diverticulosis   . Gastric ulcer   . Prostatitis   . Pneumonia 11/2014    Family History  family history is not on file.  Prior Rehab/Hospitalizations:  Has the patient had major surgery during 100 days prior to admission? No  Current Medications   Current facility-administered medications:  .  acetaminophen (TYLENOL) tablet 650 mg, 650 mg, Oral, Q4H PRN, Tanna Savoy Stinson, DO, 650 mg at 06/15/15 1240 .  antiseptic oral rinse (CPC / CETYLPYRIDINIUM CHLORIDE  0.05%) solution 7 mL, 7 mL, Mouth Rinse, q12n4p, Allie Bossier, MD, 7 mL at 06/22/15 1200 .  aspirin EC tablet 162 mg, 162 mg, Oral, Daily, Tanna Savoy Stinson, DO, 162 mg at 06/22/15 1046 .  buPROPion (WELLBUTRIN XL) 24 hr tablet 300 mg, 300 mg, Oral, QHS, Tanna Savoy Stinson, DO, 300 mg at 06/21/15 2148 .  chlorhexidine (PERIDEX) 0.12 % solution 15 mL, 15 mL, Mouth Rinse, BID, Allie Bossier, MD, 15 mL at 06/22/15 1049 .  ciprofloxacin (CIPRO) tablet 500 mg, 500 mg, Oral, BID, Milus Banister, MD, 500 mg at 06/22/15 1045 .  clonazePAM (KLONOPIN) tablet 0.5 mg, 0.5 mg, Oral, BID, Tanna Savoy Stinson, DO, 0.5 mg at 06/22/15 1045 .  hydrALAZINE (APRESOLINE) injection 5 mg, 5 mg, Intravenous, Q4H PRN, Allie Bossier, MD .  HYDROmorphone (DILAUDID) injection 1 mg, 1 mg, Intravenous, Q6H PRN, Allie Bossier, MD, 1 mg at  06/15/15 1819 .  ipratropium-albuterol (DUONEB) 0.5-2.5 (3) MG/3ML nebulizer solution 3 mL, 3 mL, Nebulization, Once, Allie Bossier, MD, 3 mL at 06/12/15 1318 .  latanoprost (XALATAN) 0.005 % ophthalmic solution 1 drop, 1 drop, Both Eyes, QHS, Tanna Savoy Stinson, DO, 1 drop at 06/21/15 2148 .  methocarbamol (ROBAXIN) 500 mg in dextrose 5 % 50 mL IVPB, 500 mg, Intravenous, Q8H PRN, Allie Bossier, MD .  metoprolol (LOPRESSOR) injection 5 mg, 5 mg, Intravenous, Q6H PRN, Ritta Slot, NP .  metoprolol tartrate (LOPRESSOR) tablet 12.5 mg, 12.5 mg, Oral, BID, Allie Bossier, MD, 12.5 mg at 06/22/15 1045 .  metroNIDAZOLE (FLAGYL) tablet 250 mg, 250 mg, Oral, 3 times per day, Milus Banister, MD, 250 mg at 06/22/15 [id number] .  nitroGLYCERIN (NITROSTAT) SL tablet 0.4 mg, 0.4 mg, Sublingual, Q5 Min x 3 PRN, Tanna Savoy Stinson, DO .  omega-3 acid ethyl esters (LOVAZA) capsule 1 g, 1 g, Oral, Daily, Tanna Savoy Stinson, DO, 1 g at 06/22/15 1046 .  ondansetron (ZOFRAN) injection 4 mg, 4 mg, Intravenous, Q6H PRN, Tanna Savoy Stinson, DO, 4 mg at 06/13/15 1834 .  oxyCODONE (Oxy IR/ROXICODONE) immediate release tablet 5 mg, 5 mg, Oral, Q4H PRN, Allie Bossier, MD, 5 mg at 06/17/15 1739 .  pantoprazole (PROTONIX) EC tablet 40 mg, 40 mg, Oral, BID, Allie Bossier, MD, 40 mg at 06/22/15 1044 .  promethazine (PHENERGAN) tablet 25 mg, 25 mg, Oral, Q6H PRN, Tanna Savoy Stinson, DO, 25 mg at 06/11/15 1856 .  rosuvastatin (CRESTOR) tablet 20 mg, 20 mg, Oral, QPM, Tanna Savoy Stinson, DO, 20 mg at 06/20/15 1717 .  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 Over past 6 months he finds himself very fatigued and SOB with minimal activity. Does own adls, cooking and cleaning and driving but a lot of rest breaks.  Home Assistive Devices / Equipment Home Assistive Devices/Equipment: Eyeglasses Home Equipment: Kasandra Knudsen - single point, Environmental consultant - 2  wheels, Shower seat  Prior Device Use: Indicate devices/aids used by the patient prior to current illness, exacerbation or injury? None of the above  Prior Functional Level Prior Function Level of Independence: Independent with assistive device(s) Comments: Pt reports he struggles to complete ADLs and requires rest breaks.  He states bending forward to reach feet to don/doff socks causes dyspnea  Self Care: Did the patient need help bathing, dressing, using the toilet or eating?  Independent  Indoor Mobility: Did the patient need assistance with walking from room to room (with or without device)? Independent  Stairs: Did the patient need assistance with internal or external stairs (with or without device)? Independent  Functional Cognition: Did the patient need help planning regular tasks such as shopping or remembering to take medications? Independent  Current Functional Level Cognition  Overall Cognitive Status: Within Functional Limits for tasks assessed Orientation Level: Oriented X4    Extremity Assessment (includes Sensation/Coordination)  Upper Extremity Assessment: Generalized weakness, LUE deficits/detail LUE Deficits / Details: Pt with intentional tremor Lt hand which he reports is new onset in the past year, but has not mentioned it to MD   Lower Extremity Assessment: Defer to PT evaluation    ADLs  Overall ADL's : Needs assistance/impaired Eating/Feeding: Independent Grooming:  Wash/dry hands, Wash/dry face, Oral care, Brushing hair, Min guard, Standing Upper Body Bathing: Set up, Sitting Lower Body Bathing: Minimal assistance, Sit to/from stand Upper Body Dressing : Set up, Sitting Lower Body Dressing: Minimal assistance, Sit to/from stand Lower Body Dressing Details (indicate cue type and reason): Pt requires min A intermittently  Toilet Transfer: Min guard, Ambulation, Comfort height toilet Toileting- Clothing Manipulation and Hygiene: Minimal assistance, Sit to/from stand Functional mobility during ADLs: Min guard, Minimal assistance, Rolling walker General ADL Comments: requires occasional assist for balance     Mobility  Overal bed mobility: Needs Assistance Bed Mobility: Sit to Supine Rolling: Modified independent (Device/Increase time) Sidelying to sit: Min guard, HOB elevated Sit to supine: Min assist General bed mobility comments: HOB elevated, cues for positioning assist forrepositioning of LEs in bed  (increased time to perform with cues )    Transfers  Overall transfer level: Needs assistance Equipment used: Rolling walker (2 wheeled) Transfers: Sit to/from Stand Sit to Stand: Min guard Stand pivot transfers: Min guard General transfer comment: Pt with + sway.  Requires min guard assist for balance.     Ambulation / Gait / Stairs / Wheelchair Mobility  Ambulation/Gait Ambulation/Gait assistance: Museum/gallery curator (Feet): 60 Feet (in 20 ft increments with extended standing rest breaks ) Assistive device: Rolling walker (2 wheeled) Gait Pattern/deviations: Step-through pattern, Decreased stride length, Shuffle, Trunk flexed General Gait Details: very slow, shuffling gait; limited by dyspnea and decr muscular endurance (legs tremulous after 15 ft) Gait velocity: very slow, shuffling gait; limited by dyspnea with exertion and BLE fatigue, standing rest breaks required Gait velocity interpretation: Below normal speed for age/gender     Posture / Balance Balance Overall balance assessment: Needs assistance Sitting-balance support: Feet supported Sitting balance-Leahy Scale: Fair Standing balance support: Bilateral upper extremity supported Standing balance-Leahy Scale: Fair Standing balance comment: requires UE support    Special needs/care consideration Bowel mgmt: loose, brown with some red streaks 06/21/15 Bladder mgmt:continent    Previous Home Environment Living Arrangements: Alone, Other (Comment) (son, Loa Socks mainly lives with son's Mom. May visit every other )  Lives With: Alone Available Help at Discharge: Family, Available PRN/intermittently Type of Home: House Home Layout: Two  level, 1/2 bath on main level, Bed/bath upstairs Alternate Level Stairs-Rails: Right Alternate Level Stairs-Number of Steps: 14 Home Access: Stairs to enter [employer] of Steps: 1 Bathroom Shower/Tub: Tub/shower unit, Architectural technologist: Standard Bathroom Accessibility: Yes How Accessible: Accessible via walker Home Care Services: No Additional Comments: pt with gradual decline in function over past year. More SOB and fatigues easily  Discharge Living Setting Plans for Discharge Living Setting: Patient's home, Alone Type of Home at Discharge: House Discharge Home Layout: Two level, 1/2 bath on main level, Bed/bath upstairs Alternate Level Stairs-Rails: Right Alternate Level Stairs-Number of Steps: 14 Discharge Home Access: Stairs to enter Entrance Stairs-Rails: None Entrance Stairs-Number of Steps: 1 Discharge Bathroom Shower/Tub: Tub/shower unit, Curtain Discharge Bathroom Toilet: Standard Discharge Bathroom Accessibility: Yes How Accessible: Accessible via walker Does the patient have any problems obtaining your medications?: No  Social/Family/Support Systems Patient Roles: Parent, Other (Comment) (64 yo son and 37 yo dtr) Sport and exercise psychologist Information: Loa Socks, son 35 years old Anticipated Caregiver: son prn  only Anticipated Caregiver's Contact Information: see above Ability/Limitations of Caregiver: Loa Socks goes to college, Careers adviser in [place] and son lives with his Mom. Pt is divorced since 2008 Caregiver Availability: Intermittent Discharge Plan Discussed with Primary Caregiver: No Is Caregiver In Agreement with Plan?: No Does Caregiver/Family have Issues with Lodging/Transportation while Pt is in Rehab?: No  Goals/Additional Needs Patient/Family Goal for Rehab: Mod I wiht PT and OT Expected length of stay: ELOS 5-7 days Pt/Family Agrees to Admission and willing to participate: Yes Program Orientation Provided & Reviewed with Pt/Caregiver Including Roles  & Responsibilities: Yes  Decrease burden of Care through IP rehab admission: n/a  Possible need for SNF placement upon discharge:not anticipated  Patient Condition: This patient's condition remains as documented in the consult dated 06/21/2015, in which the Rehabilitation Physician determined and documented that the patient's condition is appropriate for intensive rehabilitative care in an inpatient rehabilitation facility. Will admit to inpatient rehab today.  Preadmission Screen Completed By:  Cleatrice Burke, 06/22/2015 3:32 PM ______________________________________________________________________   Discussed status with Dr. Naaman Plummer on 06/22/2015 at  1531 and received telephone approval for admission today.  Admission Coordinator:  Cleatrice Burke, time 2330 Date 06/22/2015.

## 2015-06-22 NOTE — Interval H&P Note (Signed)
Sergio Griffin was admitted today to Inpatient Rehabilitation with the diagnosis of debility after multiple medical.  The patient's history has been reviewed, patient examined, and there is no change in status.  Patient continues to be appropriate for intensive inpatient rehabilitation.  I have reviewed the patient's chart and labs.  Questions were answered to the patient's satisfaction. The PAPE has been reviewed and assessment remains appropriate.  SWARTZ,ZACHARY T 06/22/2015, 8:08 PM

## 2015-06-22 NOTE — Progress Notes (Signed)
Physical Therapy Treatment Patient Details Name: Sergio Griffin MRN: [id number] DOB: 1951-11-11 Today's Date: 06/22/2015    History of Present Illness adm 7/22 abd pain, GI bleed and chest pain; elevated Troponins with ?NSTEMI vs demand ischemia; + colitis PMHx-glaucoma with Lt eye worse than Rt; CAD; MI    PT Comments    Patient seen for therapy session this afternoon post OT session. Patient tolerated well despite deconditioning and weakness. Patient receptive to cues throughout session. Motivated to progress. Patient was able to tolerated increased distance with assist for stability and use of RW. VCs for energy conservation and there-ex. Will continue to see and progress as tolerated. Continue to recommend CIR   Follow Up Recommendations  CIR     Equipment Recommendations  None recommended by PT    Recommendations for Other Services Rehab consult;OT consult     Precautions / Restrictions Precautions Precautions: Fall Precaution Comments: pt reports fall 3 mos PTA due to poor vision and tripped    Mobility  Bed Mobility Overal bed mobility: Needs Assistance Bed Mobility: Sit to Supine       Sit to supine: Min assist   General bed mobility comments: HOB elevated, cues for positioning assist forrepositioning of LEs in bed  (increased time to perform with cues )  Transfers Overall transfer level: Needs assistance Equipment used: Rolling walker (2 wheeled) Transfers: Sit to/from Stand Sit to Stand: Min guard Stand pivot transfers: Min guard       General transfer comment: Pt with + sway.  Requires min guard assist for balance.   Ambulation/Gait Ambulation/Gait assistance: Min assist Ambulation Distance (Feet): 60 Feet (in 20 ft increments with extended standing rest breaks ) Assistive device: Rolling walker (2 wheeled) Gait Pattern/deviations: Step-through pattern;Decreased stride length;Shuffle;Trunk flexed Gait velocity: very slow, shuffling gait; limited by  dyspnea with exertion and BLE fatigue, standing rest breaks required   General Gait Details: very slow, shuffling gait; limited by dyspnea and decr muscular endurance (legs tremulous after 15 ft)   Stairs            Wheelchair Mobility    Modified Rankin (Stroke Patients Only)       Balance Overall balance assessment: Needs assistance Sitting-balance support: Feet supported Sitting balance-Leahy Scale: Fair     Standing balance support: Bilateral upper extremity supported Standing balance-Leahy Scale: Fair Standing balance comment: requires UE support                    Cognition Arousal/Alertness: Awake/alert Behavior During Therapy: Anxious Overall Cognitive Status: Within Functional Limits for tasks assessed                      Exercises General Exercises - Lower Extremity Ankle Circles/Pumps: AROM;Both;10 reps Quad Sets: AROM;Both;5 reps Hip ABduction/ADduction: AROM;Both;5 reps    General Comments General comments (skin integrity, edema, etc.): Patient very motivated for participation, frustrated by deconditioning and LE weakenss limiting mobility. Cues for energy conservation with mobiliy. O2 saturations >89% on room air with activity.       Pertinent Vitals/Pain Pain Assessment: No/denies pain    Home Living Family/patient expects to be discharged to:: Private residence Living Arrangements: Children (son in college; home every other weekend) Available Help at Discharge: Family;Available PRN/intermittently Type of Home: House Home Access: Stairs to enter   Home Layout: Two level;1/2 bath on main level;Bed/bath upstairs (? could put bed in dining room) Home Equipment: Cane - single point;Walker - 2 wheels;Shower seat  Prior Function Level of Independence: Independent with assistive device(s)      Comments: Pt reports he struggles to complete ADLs and requires rest breaks.  He states bending forward to reach feet to don/doff socks  causes dyspnea   PT Goals (current goals can now be found in the care plan section) Acute Rehab PT Goals Patient Stated Goal: to get stronger  PT Goal Formulation: With patient Time For Goal Achievement: 07/05/15 Potential to Achieve Goals: Good Progress towards PT goals: Progressing toward goals    Frequency  Min 3X/week    PT Plan Current plan remains appropriate    Co-evaluation             End of Session Equipment Utilized During Treatment: Gait belt Activity Tolerance: Patient limited by fatigue Patient left: in chair;with call bell/phone within reach     Time: 1151-1216 PT Time Calculation (min) (ACUTE ONLY): 25 min  Charges:  $Gait Training: 8-22 mins $Therapeutic Activity: 8-22 mins                    G CodesDuncan Dull 07/19/15, 12:58 PM Alben Deeds, [place] DPT  [phone]

## 2015-06-22 NOTE — Evaluation (Signed)
Occupational Therapy Evaluation Patient Details Name: Sergio Griffin MRN: [id number] DOB: 10/29/1951 Today's Date: 06/22/2015    History of Present Illness adm 7/22 abd pain, GI bleed and chest pain; elevated Troponins with ?NSTEMI vs demand ischemia; + colitis PMHx-glaucoma with Lt eye worse than Rt; CAD; MI   Clinical Impression   Pt admitted with above. He demonstrates the below listed deficits and will benefit from continued OT to maximize safety and independence with BADLs.  Pt presents to OT with generalized weakness and impaired balance.  Currently, he requires min guard - min A for ADLs.  He needs to be mod I to return home as he has little to no assist at discharge.  Feel he would be able to achieve mod I status with CIR      Follow Up Recommendations  CIR    Equipment Recommendations  None recommended by OT    Recommendations for Other Services Rehab consult     Precautions / Restrictions Precautions Precautions: Fall Precaution Comments: pt reports fall 3 mos PTA due to poor vision and tripped      Mobility Bed Mobility                  Transfers Overall transfer level: Needs assistance Equipment used: Rolling walker (2 wheeled) Transfers: Stand Pivot Transfers;Sit to/from Stand Sit to Stand: Min guard Stand pivot transfers: Min guard       General transfer comment: Pt with + sway.  Requires min guard assist for balance.     Balance Overall balance assessment: Needs assistance Sitting-balance support: Feet supported Sitting balance-Leahy Scale: Fair     Standing balance support: Bilateral upper extremity supported Standing balance-Leahy Scale: Fair Standing balance comment: requires UE support                            ADL Overall ADL's : Needs assistance/impaired Eating/Feeding: Independent   Grooming: Wash/dry hands;Wash/dry face;Oral care;Brushing hair;Min guard;Standing   Upper Body Bathing: Set up;Sitting   Lower Body  Bathing: Minimal assistance;Sit to/from stand   Upper Body Dressing : Set up;Sitting   Lower Body Dressing: Minimal assistance;Sit to/from stand Lower Body Dressing Details (indicate cue type and reason): Pt requires min A intermittently  Toilet Transfer: Min guard;Ambulation;Comfort height toilet   Toileting- Clothing Manipulation and Hygiene: Minimal assistance;Sit to/from stand       Functional mobility during ADLs: Min guard;Minimal assistance;Rolling walker General ADL Comments: requires occasional assist for balance      Vision     Perception     Praxis      Pertinent Vitals/Pain Pain Assessment: No/denies pain     Hand Dominance Right   Extremity/Trunk Assessment Upper Extremity Assessment Upper Extremity Assessment: Generalized weakness;LUE deficits/detail LUE Deficits / Details: Pt with intentional tremor Lt hand which he reports is new onset in the past year, but has not mentioned it to MD    Lower Extremity Assessment Lower Extremity Assessment: Defer to PT evaluation   Cervical / Trunk Assessment Cervical / Trunk Assessment: Other exceptions (morbid obesity )   Communication Communication Communication: No difficulties   Cognition Arousal/Alertness: Awake/alert Behavior During Therapy: Anxious Overall Cognitive Status: Within Functional Limits for tasks assessed                     General Comments       Exercises       Shoulder Instructions      Home  Living Family/patient expects to be discharged to:: Private residence Living Arrangements: Children (son in college; home every other weekend) Available Help at Discharge: Family;Available PRN/intermittently Type of Home: House Home Access: Stairs to enter Entrance Stairs-Number of Steps: 1   Home Layout: Two level;1/2 bath on main level;Bed/bath upstairs (? could put bed in dining room) Alternate Level Stairs-Number of Steps: 14 (2 landings; ) Alternate Level Stairs-Rails:  Right Bathroom Shower/Tub: Tub/shower unit;Curtain   Bathroom Toilet: Standard (std downstairs; comfort height upstairs) Bathroom Accessibility:  (unsure)   Home Equipment: Cane - single point;Walker - 2 wheels;Shower seat          Prior Functioning/Environment Level of Independence: Independent with assistive device(s)        Comments: Pt reports he struggles to complete ADLs and requires rest breaks.  He states bending forward to reach feet to don/doff socks causes dyspnea    OT Diagnosis: Generalized weakness   OT Problem List: Decreased strength;Decreased activity tolerance;Impaired balance (sitting and/or standing);Decreased knowledge of use of DME or AE;Obesity   OT Treatment/Interventions: Self-care/ADL training;Therapeutic exercise;Energy conservation;DME and/or AE instruction;Therapeutic activities;Patient/family education;Balance training    OT Goals(Current goals can be found in the care plan section) Acute Rehab OT Goals Patient Stated Goal: to get stronger  OT Goal Formulation: With patient Time For Goal Achievement: 07/06/15 Potential to Achieve Goals: Good ADL Goals Pt Will Perform Grooming: with modified independence;standing Pt Will Perform Upper Body Bathing: with modified independence;sitting Pt Will Perform Lower Body Bathing: with modified independence;sit to/from stand;with adaptive equipment Pt Will Perform Upper Body Dressing: with modified independence;sitting Pt Will Perform Lower Body Dressing: with modified independence;with adaptive equipment;sit to/from stand Pt Will Transfer to Toilet: with modified independence;ambulating;regular height toilet;bedside commode;grab bars Pt Will Perform Toileting - Clothing Manipulation and hygiene: with modified independence;sit to/from stand Pt Will Perform Tub/Shower Transfer: Tub transfer;with modified independence;shower seat;ambulating;rolling walker Pt/caregiver will Perform Home Exercise Program: Increased  strength;Both right and left upper extremity;With written HEP provided;Independently Additional ADL Goal #1: Pt will be independent with energy conservation techniques   OT Frequency: Min 2X/week   Barriers to D/C: Decreased caregiver support          Co-evaluation              End of Session Equipment Utilized During Treatment: Rolling walker  Activity Tolerance:   Patient left:     Time: 1101-1130 OT Time Calculation (min): 29 min Charges:  OT General Charges $OT Visit: 1 Procedure OT Evaluation $Initial OT Evaluation Tier I: 1 Procedure OT Treatments $Therapeutic Activity: 8-22 mins G-Codes:    Brnadon Eoff M 2015/07/20, 11:46 AM

## 2015-06-22 NOTE — Progress Notes (Signed)
Pt arrived on unit with nurse and tech from 3S, settled into room. Vital signs stable. No Pain. Oriented to rehab.

## 2015-06-22 NOTE — Progress Notes (Signed)
I met with pt at bedside and discussed with Dr. Naaman Plummer. Both pt and Dr. Naaman Plummer in agreement to admission. I will make the arrangements. RN CM aware.[phone]

## 2015-06-22 NOTE — Progress Notes (Signed)
Physical Medicine and Rehabilitation Consult  Reason for Consult: Debility Referring Physician: Dr. Sherral Hammers.    HPI: Sergio Griffin is a 64 y.o. male with history of HTN, diverticulosis, recent prostatitis, CAD who was admitted via OSH on 06/10/15 with abdominal pain and diarrhea due to colitis as well as CP due to NSTEMI. He was started on IV heparin and IVF for acute on chronic renal failure with lactic acidosis. 2D echo done revealing moderate LVH with EF 60%, aortic sclerosis without stenosis and no pericardial effusion. He has had sleep wake disruption with hypersomnia. He developed bloody stools and Dr. Fuller Plan questioned IBS v/s infectious v/s ischemic colitis. CTA abdomen/pelvis was negative for mesenteric arterial/venous occlusive disease and progressive changes of colitis in distal transverse and descending segments. Colitis felt to be ischemic and IV heparin d/c and bowel rest with improvement in symptoms. Patient with DOE due to pulmonary edema and started on gentle diuresis. PT evaluation done today and patient noted to be deconditioned. CIR recommended for follow up therapy.    Review of Systems  Constitutional: Positive for malaise/fatigue (for past 6 months).  HENT: Negative for hearing loss.  Eyes: Positive for blurred vision (limited due to glaucoma).  Respiratory: Positive for shortness of breath (dyspnea with minimal activity for 6 months).  Cardiovascular: Positive for chest pain (chest discomfort for past few months) and palpitations. Negative for leg swelling.  Gastrointestinal: Positive for abdominal pain. Negative for heartburn, nausea and diarrhea.  Genitourinary: Negative for urgency and frequency.  Musculoskeletal: Negative for myalgias and back pain.  Neurological: Positive for dizziness (with positional changes.). Negative for focal weakness and headaches.  Psychiatric/Behavioral: The patient has insomnia.      Past Medical History  Diagnosis Date  .  Hypertension   . Reflux   . Glaucoma   . Hyperlipidemia   . CAD (coronary artery disease)     PCI, stent 2011  . Diverticulosis   . Gastric ulcer   . Prostatitis   . Pneumonia 11/2014    Past Surgical History  Procedure Laterality Date  . Cardiac stents    . Eye surgery      History reviewed. No pertinent family history.    Social History: Lives alone. Medical technologist--disabled due to vision. He reports that he has quit smoking 30 years ago. He does not have any smokeless tobacco history on file. He reports that he does not drink alcohol. His drug history is not on file.    Allergies  Allergen Reactions  . Sulfa Antibiotics Itching    Medications Prior to Admission  Medication Sig Dispense Refill  . aspirin EC 81 MG tablet Take 162 mg by mouth daily.    Marland Kitchen buPROPion (WELLBUTRIN XL) 300 MG 24 hr tablet Take 300 mg by mouth at bedtime.    . clonazePAM (KLONOPIN) 0.5 MG tablet Take 0.5 mg by mouth 2 (two) times daily.    Marland Kitchen latanoprost (XALATAN) 0.005 % ophthalmic solution Place 1 drop into both eyes at bedtime.    Marland Kitchen losartan (COZAAR) 50 MG tablet Take 50 mg by mouth daily.    . metoprolol succinate (TOPROL-XL) 25 MG 24 hr tablet Take 25 mg by mouth daily.    . Omega-3 Fatty Acids (FISH OIL) 1000 MG CAPS Take 1,000 mg by mouth daily.    . promethazine (PHENERGAN) 25 MG tablet Take 25 mg by mouth every 6 (six) hours as needed for nausea or vomiting.    . rosuvastatin (CRESTOR) 20 MG tablet Take 20  mg by mouth every evening.    . sucralfate (CARAFATE) 1 G tablet Take 1 g by mouth 4 (four) times daily - with meals and at bedtime.    . Vitamin D, Cholecalciferol, 1000 UNITS TABS Take 1,000 mg by mouth daily.      Home: Home Living Family/patient expects to be discharged to:: Private residence Living Arrangements: Children (son in college; home every other  weekend) Available Help at Discharge: Family, Available PRN/intermittently Type of Home: House Home Access: Stairs to enter Technical brewer of Steps: 1 Home Layout: Two level, 1/2 bath on main level, Bed/bath upstairs (? could put bed in dining room) Alternate Level Stairs-Number of Steps: 14 (2 landings; ) Alternate Level Stairs-Rails: Right Bathroom Shower/Tub: Tub/shower unit, Architectural technologist: Standard (std downstairs; comfort height upstairs) Bathroom Accessibility: (unsure) Home Equipment: Cane - single point, Environmental consultant - 2 wheels, Shower seat  Functional History: Prior Function Level of Independence: Independent with assistive device(s) Comments: occasional use of cane due to imbalance; fell 3 months PTA (tripped over footstool he had moved) Functional Status:  Mobility: Bed Mobility Overal bed mobility: Needs Assistance Bed Mobility: Rolling, Sidelying to Sit Rolling: Modified independent (Device/Increase time) Sidelying to sit: Min guard, HOB elevated General bed mobility comments: HOB elevated due to respiratory status; + use of rail; +dizziness upon sitting Transfers Overall transfer level: Needs assistance Equipment used: Rolling walker (2 wheeled) Transfers: Sit to/from Stand Sit to Stand: Min assist General transfer comment: ; +dizziness on standing with + sway and min assist Ambulation/Gait Ambulation/Gait assistance: Min assist Ambulation Distance (Feet): 30 Feet (seated rest, 15 ft) Assistive device: Rolling walker (2 wheeled) Gait Pattern/deviations: Step-through pattern, Decreased stride length, Shuffle, Trunk flexed General Gait Details: very slow, shuffling gait; limited by dyspnea and decr muscular endurance (legs tremulous after 15 ft) Gait velocity interpretation: Below normal speed for age/gender    ADL:    Cognition: Cognition Overall Cognitive Status: Within Functional Limits for tasks assessed Orientation Level: Oriented  X4 Cognition Arousal/Alertness: Awake/alert Behavior During Therapy: WFL for tasks assessed/performed Overall Cognitive Status: Within Functional Limits for tasks assessed  Blood pressure 109/77, pulse 81, temperature 98.9 F (37.2 C), temperature source Oral, resp. rate 31, height 5\' 11"  (1.803 m), weight 128.368 kg (283 lb), SpO2 96 %. Physical Exam  Nursing note and vitals reviewed. Constitutional: He is oriented to person, place, and time. He appears well-developed and well-nourished.  HENT:  Head: Normocephalic and atraumatic.  Eyes: Conjunctivae are normal. Pupils are equal, round, and reactive to light.  Neck: Normal range of motion. Neck supple.  Cardiovascular: Normal rate and regular rhythm.  Respiratory: Effort normal and breath sounds normal. No respiratory distress. He has no wheezes.  GI: Soft. Bowel sounds are normal. He exhibits no distension. There is no tenderness.  Musculoskeletal: He exhibits no edema or tenderness.  Neurological: He is alert and oriented to person, place, and time.  Speech clear. Follows commands without difficulty. BUE with intentional tremors. UE strength grossly 4+ to 5/5. LE: 4/5 prox to 5/5 distally. No sensory findings. Cognitively appropriate  Skin: Skin is warm and dry.  Psychiatric: He has a normal mood and affect. His behavior is normal. Judgment and thought content normal.     Lab Results Last 24 Hours    Results for orders placed or performed during the hospital encounter of 06/10/15 (from the past 24 hour(s))  Comprehensive metabolic panel Status: Abnormal   Collection Time: 06/21/15 2:10 AM  Result Value Ref Range  Sodium 134 (L) 135 - 145 mmol/L   Potassium 3.3 (L) 3.5 - 5.1 mmol/L   Chloride 92 (L) 101 - 111 mmol/L   CO2 33 (H) 22 - 32 mmol/L   Glucose, Bld 221 (H) 65 - 99 mg/dL   BUN 17 6 - 20 mg/dL   Creatinine, Ser 1.68 (H) 0.61 - 1.24 mg/dL   Calcium 7.8 (L) 8.9 - 10.3 mg/dL    Total Protein 6.1 (L) 6.5 - 8.1 g/dL   Albumin 2.5 (L) 3.5 - 5.0 g/dL   AST 26 15 - 41 U/L   ALT 21 17 - 63 U/L   Alkaline Phosphatase 39 38 - 126 U/L   Total Bilirubin 0.5 0.3 - 1.2 mg/dL   GFR calc non Af Amer 41 (L) >60 mL/min   GFR calc Af Amer 48 (L) >60 mL/min   Anion gap 9 5 - 15  CBC Status: Abnormal   Collection Time: 06/21/15 2:10 AM  Result Value Ref Range   WBC 11.1 (H) 4.0 - 10.5 K/uL   RBC 4.03 (L) 4.22 - 5.81 MIL/uL   Hemoglobin 11.3 (L) 13.0 - 17.0 g/dL   HCT 35.4 (L) 39.0 - 52.0 %   MCV 87.8 78.0 - 100.0 fL   MCH 28.0 26.0 - 34.0 pg   MCHC 31.9 30.0 - 36.0 g/dL   RDW 14.6 11.5 - 15.5 %   Platelets 330 150 - 400 K/uL  Magnesium Status: None   Collection Time: 06/21/15 2:10 AM  Result Value Ref Range   Magnesium 2.0 1.7 - 2.4 mg/dL      Imaging Results (Last 48 hours)    No results found.    Assessment/Plan: Diagnosis: debility after multiple medical 1. Does the need for close, 24 hr/day medical supervision in concert with the patient's rehab needs make it unreasonable for this patient to be served in a less intensive setting? Yes and Potentially 2. Co-Morbidities requiring supervision/potential complications: CAD, colitis, htn, a/ckd 3. Due to bladder management, bowel management, safety, skin/wound care, disease management, medication administration, pain management and patient education, does the patient require 24 hr/day rehab nursing? Yes and Potentially 4. Does the patient require coordinated care of a physician, rehab nurse, PT (1-2 hrs/day, 5 days/week) and OT (1-2 hrs/day, 5 days/week) to address physical and functional deficits in the context of the above medical diagnosis(es)? Yes and Potentially Addressing deficits in the following areas: balance, endurance, locomotion, strength, transferring, bowel/bladder control, bathing, dressing, feeding, grooming,  toileting and psychosocial support 5. Can the patient actively participate in an intensive therapy program of at least 3 hrs of therapy per day at least 5 days per week? Yes 6. The potential for patient to make measurable gains while on inpatient rehab is good 7. Anticipated functional outcomes upon discharge from inpatient rehab are modified independent with PT, modified independent with OT, modified independent with SLP. 8. Estimated rehab length of stay to reach the above functional goals is: 7 days potentially 9. Does the patient have adequate social supports and living environment to accommodate these discharge functional goals? Potentially 10. Anticipated D/C setting: Home 11. Anticipated post D/C treatments: [place] therapy 12. Overall Rehab/Functional Prognosis: excellent  RECOMMENDATIONS: This patient's condition is appropriate for continued rehabilitative care in the following setting: CIR vs HH therapies Patient has agreed to participate in recommended program. Potentially Note that insurance prior authorization may be required for reimbursement for recommended care.  Comment: Pt beginning to make medical and functional progress. Would like to see  performance with therapies tomorrow before advising inpatient rehab. Rehab Admissions Coordinator to follow up.  Thanks,  Meredith Staggers, MD, Mellody Drown     06/21/2015       Revision History     Date/Time User Provider Type Action   06/21/2015 3:33 PM Meredith Staggers, MD Physician Sign   06/21/2015 1:34 PM Cathlyn Parsons, PA-C Physician Assistant Share   06/21/2015 12:18 PM Bary Leriche, PA-C Physician Assistant Share   View Details Report       Routing History     Date/Time From To Method   06/21/2015 3:33 PM Meredith Staggers, MD Meredith Staggers, MD In Basket

## 2015-06-22 NOTE — Progress Notes (Signed)
PMR Admission Coordinator Pre-Admission Assessment  Patient: Sergio Griffin is an 64 y.o., male MRN: [id number] DOB: 1951/08/29 Height: 5\' 11"  (180.3 cm) Weight: 128 kg (282 lb 3 oz)  Insurance Information HMO: PPO: PCP: IPA: 80/20: yes OTHER: no HMO PRIMARY: Medicare a and b Policy#: [id number] a Subscriber: pt Benefits: Phone #: palmetto online Name: 06/22/2015 Eff. Date: 9/1/ 2010 Deduct: $1288 Out of Pocket Max: none Life Max: none CIR: 100% SNF: 20 full days Outpatient: 80% Co-Pay: 20% Home Health: 100% Co-Pay: none DME: 80% Co-Pay: 20% Providers: pt choice  SECONDARY: none  Medicaid Application Date: Case Manager:  Disability Application Date: Case Worker:   Emergency Contact Information Contact Information    Name Relation Home Work Mobile   [place] Son [place] Daughter   [phone]     Current Medical History  Patient Admitting Diagnosis: debility after multiple medical  History of Present Illness:Sergio Griffin is a 64 y.o. male with history of HTN, diverticulosis, morbid obesity, recent prostatitis, CAD who was admitted via OSH on 06/10/15 with abdominal pain and diarrhea due to colitis as well as CP due to NSTEMI. He was started on IV heparin and IVF for acute on chronic renal failure with lactic acidosis. 2D echo done revealing moderate LVH with EF 60%, aortic sclerosis without stenosis and no pericardial effusion. He has had sleep wake disruption with hypersomnia. He developed bloody stools and Dr. Fuller Plan questioned IBS v/s infectious v/s ischemic colitis. CTA abdomen/pelvis was negative for mesenteric arterial/venous occlusive disease and progressive changes of colitis in distal transverse and  descending segments. Colitis felt to be ischemic and IV heparin d/c and patient placed on bowel rest with improvement in symptoms. Patient developed DOE due to pulmonary edema and was started on gentle diuresis with improvement in respiratory status.   Plan is to complete 7 days of antibiotic therapy for colitis and follow up in the GI office for ultimate colonoscopy Likely in 5-7 weeks.  Elevated troponin felt to be NSTEMI vs demand ischemia related to stress of infection. Cardiology would like to be contacted them to reassess once GI issues stable and likely to see outpatient clinic after discharge.  Past Medical History  Past Medical History  Diagnosis Date  . Hypertension   . Reflux   . Glaucoma   . Hyperlipidemia   . CAD (coronary artery disease)     PCI, stent 2011  . Diverticulosis   . Gastric ulcer   . Prostatitis   . Pneumonia 11/2014    Family History  family history is not on file.  Prior Rehab/Hospitalizations:  Has the patient had major surgery during 100 days prior to admission? No  Current Medications   Current facility-administered medications:  . acetaminophen (TYLENOL) tablet 650 mg, 650 mg, Oral, Q4H PRN, Tanna Savoy Stinson, DO, 650 mg at 06/15/15 1240 . antiseptic oral rinse (CPC / CETYLPYRIDINIUM CHLORIDE 0.05%) solution 7 mL, 7 mL, Mouth Rinse, q12n4p, Allie Bossier, MD, 7 mL at 06/22/15 1200 . aspirin EC tablet 162 mg, 162 mg, Oral, Daily, Tanna Savoy Stinson, DO, 162 mg at 06/22/15 1046 . buPROPion (WELLBUTRIN XL) 24 hr tablet 300 mg, 300 mg, Oral, QHS, Tanna Savoy Stinson, DO, 300 mg at 06/21/15 2148 . chlorhexidine (PERIDEX) 0.12 % solution 15 mL, 15 mL, Mouth Rinse, BID, Allie Bossier, MD, 15 mL at 06/22/15 1049 . ciprofloxacin (CIPRO) tablet 500 mg, 500 mg, Oral, BID, Milus Banister, MD, 500 mg at 06/22/15 1045 . clonazePAM (  KLONOPIN) tablet 0.5 mg, 0.5 mg, Oral, BID, Tanna Savoy Stinson, DO, 0.5 mg at 06/22/15 1045 .  hydrALAZINE (APRESOLINE) injection 5 mg, 5 mg, Intravenous, Q4H PRN, Allie Bossier, MD . HYDROmorphone (DILAUDID) injection 1 mg, 1 mg, Intravenous, Q6H PRN, Allie Bossier, MD, 1 mg at 06/15/15 1819 . ipratropium-albuterol (DUONEB) 0.5-2.5 (3) MG/3ML nebulizer solution 3 mL, 3 mL, Nebulization, Once, Allie Bossier, MD, 3 mL at 06/12/15 1318 . latanoprost (XALATAN) 0.005 % ophthalmic solution 1 drop, 1 drop, Both Eyes, QHS, Tanna Savoy Stinson, DO, 1 drop at 06/21/15 2148 . methocarbamol (ROBAXIN) 500 mg in dextrose 5 % 50 mL IVPB, 500 mg, Intravenous, Q8H PRN, Allie Bossier, MD . metoprolol (LOPRESSOR) injection 5 mg, 5 mg, Intravenous, Q6H PRN, Ritta Slot, NP . metoprolol tartrate (LOPRESSOR) tablet 12.5 mg, 12.5 mg, Oral, BID, Allie Bossier, MD, 12.5 mg at 06/22/15 1045 . metroNIDAZOLE (FLAGYL) tablet 250 mg, 250 mg, Oral, 3 times per day, Milus Banister, MD, 250 mg at 06/22/15 [phone] . nitroGLYCERIN (NITROSTAT) SL tablet 0.4 mg, 0.4 mg, Sublingual, Q5 Min x 3 PRN, Tanna Savoy Stinson, DO . omega-3 acid ethyl esters (LOVAZA) capsule 1 g, 1 g, Oral, Daily, Tanna Savoy Stinson, DO, 1 g at 06/22/15 1046 . ondansetron (ZOFRAN) injection 4 mg, 4 mg, Intravenous, Q6H PRN, Tanna Savoy Stinson, DO, 4 mg at 06/13/15 1834 . oxyCODONE (Oxy IR/ROXICODONE) immediate release tablet 5 mg, 5 mg, Oral, Q4H PRN, Allie Bossier, MD, 5 mg at 06/17/15 1739 . pantoprazole (PROTONIX) EC tablet 40 mg, 40 mg, Oral, BID, Allie Bossier, MD, 40 mg at 06/22/15 1044 . promethazine (PHENERGAN) tablet 25 mg, 25 mg, Oral, Q6H PRN, Tanna Savoy Stinson, DO, 25 mg at 06/11/15 1856 . rosuvastatin (CRESTOR) tablet 20 mg, 20 mg, Oral, QPM, Tanna Savoy Stinson, DO, 20 mg at 06/20/15 1717 . 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 Over past 6 months he finds himself very fatigued and SOB with minimal activity. Does own adls, cooking and cleaning and driving but a lot of rest breaks.  Home Assistive Devices / Equipment Home Assistive Devices/Equipment: Eyeglasses Home Equipment: Cane - single point, Environmental consultant - 2 wheels, Shower seat  Prior Device Use: Indicate devices/aids used by the patient prior to current illness, exacerbation or injury? None of the above  Prior Functional Level Prior Function Level of Independence: Independent with assistive device(s) Comments: Pt reports he struggles to complete ADLs and requires rest breaks. He states bending forward to reach feet to don/doff socks causes dyspnea  Self Care: Did the patient need help bathing, dressing, using the toilet or eating? Independent  Indoor Mobility: Did the patient need assistance with walking from room to room (with or without device)? Independent  Stairs: Did the patient need assistance with internal or external stairs (with or without device)? Independent  Functional Cognition: Did the patient need help planning regular  tasks such as shopping or remembering to take medications? Independent  Current Functional Level Cognition  Overall Cognitive Status: Within Functional Limits for tasks assessed Orientation Level: Oriented X4   Extremity Assessment (includes Sensation/Coordination)  Upper Extremity Assessment: Generalized weakness, LUE deficits/detail LUE Deficits / Details: Pt with intentional tremor Lt hand which he reports is new onset in the  past year, but has not mentioned it to MD   Lower Extremity Assessment: Defer to PT evaluation    ADLs  Overall ADL's : Needs assistance/impaired Eating/Feeding: Independent Grooming: Wash/dry hands, Wash/dry face, Oral care, Brushing hair, Min guard, Standing Upper Body Bathing: Set up, Sitting Lower Body Bathing: Minimal assistance, Sit to/from stand Upper Body Dressing : Set up, Sitting Lower Body Dressing: Minimal assistance, Sit to/from stand Lower Body Dressing Details (indicate cue type and reason): Pt requires min A intermittently  Toilet Transfer: Min guard, Ambulation, Comfort height toilet Toileting- Clothing Manipulation and Hygiene: Minimal assistance, Sit to/from stand Functional mobility during ADLs: Min guard, Minimal assistance, Rolling walker General ADL Comments: requires occasional assist for balance     Mobility  Overal bed mobility: Needs Assistance Bed Mobility: Sit to Supine Rolling: Modified independent (Device/Increase time) Sidelying to sit: Min guard, HOB elevated Sit to supine: Min assist General bed mobility comments: HOB elevated, cues for positioning assist forrepositioning of LEs in bed (increased time to perform with cues )    Transfers  Overall transfer level: Needs assistance Equipment used: Rolling walker (2 wheeled) Transfers: Sit to/from Stand Sit to Stand: Min guard Stand pivot transfers: Min guard General transfer comment: Pt with + sway. Requires min guard assist for balance.     Ambulation / Gait / Stairs / Wheelchair Mobility  Ambulation/Gait Ambulation/Gait assistance: Museum/gallery curator (Feet): 60 Feet (in 20 ft increments with extended standing rest breaks ) Assistive device: Rolling walker (2 wheeled) Gait Pattern/deviations: Step-through pattern, Decreased stride length, Shuffle, Trunk flexed General Gait Details: very slow, shuffling gait; limited by dyspnea and decr muscular endurance (legs tremulous  after 15 ft) Gait velocity: very slow, shuffling gait; limited by dyspnea with exertion and BLE fatigue, standing rest breaks required Gait velocity interpretation: Below normal speed for age/gender    Posture / Balance Balance Overall balance assessment: Needs assistance Sitting-balance support: Feet supported Sitting balance-Leahy Scale: Fair Standing balance support: Bilateral upper extremity supported Standing balance-Leahy Scale: Fair Standing balance comment: requires UE support    Special needs/care consideration Bowel mgmt: loose, brown with some red streaks 06/21/15 Bladder mgmt:continent    Previous Home Environment Living Arrangements: Alone, Other (Comment) (son, Loa Socks mainly lives with son's Mom. May visit every other ) Lives With: Alone Available Help at Discharge: Family, Available PRN/intermittently Type of Home: House Home Layout: Two level, 1/2 bath on main level, Bed/bath upstairs Alternate Level Stairs-Rails: Right Alternate Level Stairs-Number of Steps: 14 Home Access: Stairs to enter [employer] of Steps: 1 Bathroom Shower/Tub: Tub/shower unit, Architectural technologist: Standard Bathroom Accessibility: Yes How Accessible: Accessible via walker [place]: No Additional Comments: pt with gradual decline in function over past year. More SOB and fatigues easily  Discharge Living Setting Plans for Discharge Living Setting: Patient's home, Alone Type of Home at Discharge: House Discharge Home Layout: Two level, 1/2 bath on main level, Bed/bath upstairs Alternate Level Stairs-Rails: Right Alternate Level Stairs-Number of Steps: 14 Discharge Home Access: Stairs to enter Entrance Stairs-Rails: None Entrance Stairs-Number of Steps: 1 Discharge Bathroom Shower/Tub: Tub/shower unit, Curtain Discharge Bathroom Toilet: Standard Discharge  Bathroom Accessibility: Yes How Accessible: Accessible via walker Does the patient have any problems  obtaining your medications?: No  Social/Family/Support Systems Patient Roles: Parent, Other (Comment) (33 yo son and 27 yo dtr) Sport and exercise psychologist Information: Loa Socks, son 28 years old Anticipated Caregiver: son prn only Anticipated Caregiver's Contact Information: see above Ability/Limitations of Caregiver: Loa Socks goes to college, Careers adviser in [place] and son lives with his Mom. Pt is divorced since 2008 Caregiver Availability: Intermittent Discharge Plan Discussed with Primary Caregiver: No Is Caregiver In Agreement with Plan?: No Does Caregiver/Family have Issues with Lodging/Transportation while Pt is in Rehab?: No  Goals/Additional Needs Patient/Family Goal for Rehab: Mod I wiht PT and OT Expected length of stay: ELOS 5-7 days Pt/Family Agrees to Admission and willing to participate: Yes Program Orientation Provided & Reviewed with Pt/Caregiver Including Roles & Responsibilities: Yes  Decrease burden of Care through IP rehab admission: n/a  Possible need for SNF placement upon discharge:not anticipated  Patient Condition: This patient's condition remains as documented in the consult dated 06/21/2015, in which the Rehabilitation Physician determined and documented that the patient's condition is appropriate for intensive rehabilitative care in an inpatient rehabilitation facility. Will admit to inpatient rehab today.  Preadmission Screen Completed By: Cleatrice Burke, 06/22/2015 3:32 PM ______________________________________________________________________  Discussed status with Dr. Naaman Plummer on 06/22/2015 at 1531 and received telephone approval for admission today.  Admission Coordinator: Cleatrice Burke, time 1610 Date 06/22/2015.          Cosigned by: Meredith Staggers, MD at 06/22/2015 3:37 PM  Revision History     Date/Time User Provider Type Action   06/22/2015 3:37 PM Meredith Staggers, MD Physician Cosign   06/22/2015 3:34 PM Cristina Gong, RN Rehab Admission Coordinator Sign

## 2015-06-22 NOTE — Progress Notes (Signed)
Patient tranferred via wheelchair to inpatient rehab at ~1850. Patient alert and oriented and safe

## 2015-06-22 NOTE — Discharge Summary (Signed)
DISCHARGE SUMMARY  Sergio Griffin  MR#: [id number]  DOB:05/17/1951  Date of Admission: 06/10/2015 Date of Discharge: 06/22/2015  Attending Physician:Jaelyn Cloninger T  Patient's PCP:No primary care provider on file.  Consults: [facility]  Disposition: D/C to CIR   Follow-up Appts/Tests: -f/u imaging of his pulmonary nodule will be required in 6 months  -The patient should schedule an appointment with his GI physician for outpatient follow-up within 2 weeks of his discharge from CIR -The patient should schedule an appointment with his cardiologist for outpatient follow-up within 2 weeks of his discharge from CIR  Discharge Diagnoses: Infectious Colitis / ischemic colitis / Sepsis unspecified organism Elevated troponin - NSTEMI v/s demand ischemia HTN Pulmonary nodule (previous smoker) Pulmonary edema Acute kidney failure (no previous Cr for baseline) Hypokalemia Acute blood loss anemia  Initial presentation: 64 y.o. M Hx HTN, HLD, CAD with coronary angiogram with stenting approximately 5-6 years ago, GERD, S/P colonoscopy~2011 showing diverticulitis (reported by patient) who presented to the hospital with increasing abdominal pain and bloody diarrhea that started the day of presentation. He had multiple episodes of bloody diarrhea accompanied by feeling extremely ill with left-sided, substernal chest pain, diaphoresis, cold sweats, and retching.   Hospital Course:  Infectious Colitis v/s ischemic colitis - Sepsis unspecified organism  -Stool cultures negative -Clinically improving - plan is to complete 7 additional days of oral antibiotic therapy (through 06/26/2015) and follow up in the GI office for ultimate colonoscopy (likely in 5-7 weeks)  Elevated troponin - NSTEMI v/s demand ischemia -7/28 D/Ced heparin 2dary to continue GI bleed overnight -Troponins only very mildly elevated transiently -Continue Crestor 20 mg daily  -likely demand ischemia  secondary to stress of infection -The patient should follow-up with his cardiologist for outpatient risk evaluation following his discharge from CIR  HTN -Blood pressure currently well controlled  Pulmonary nodule (previous smoker) -6 mm pulmonary nodule in RLL -Patient previous smoker therefore he will require follow-up chest CT in 6 months -Findings discussed with patient  Pulmonary edema -Now clinically stable - discontinued Lasix - follow  Acute kidney failure (no previous Cr for baseline) -creatinine has been fluctuating up and down but appears to have stabilized at approximately 1.3 -Would continue to hold all potential nephrotoxic medications  Hypokalemia -Corrected with replacement  Acute blood loss anemia -Hemoglobin now stable with no evidence of ongoing bleeding - to follow-up with GI post CIR discharge  Current medications at time of transfer to CIR:  . antiseptic oral rinse  7 mL Mouth Rinse q12n4p  . aspirin EC  162 mg Oral Daily  . buPROPion  300 mg Oral QHS  . chlorhexidine  15 mL Mouth Rinse BID  . ciprofloxacin  500 mg Oral BID  . clonazePAM  0.5 mg Oral BID  . ipratropium-albuterol  3 mL Nebulization Once  . latanoprost  1 drop Both Eyes QHS  . metoprolol tartrate  12.5 mg Oral BID  . metroNIDAZOLE  250 mg Oral 3 times per day  . omega-3 acid ethyl esters  1 g Oral Daily  . pantoprazole  40 mg Oral BID  . rosuvastatin  20 mg Oral QPM    Day of Discharge BP 117/64 mmHg  Pulse 87  Temp(Src) 97.6 F (36.4 C) (Oral)  Resp 23  Ht 5\' 11"  (1.803 m)  Wt 128 kg (282 lb 3 oz)  BMI 39.37 kg/m2  SpO2 97%  Physical Exam: General: No acute respiratory distress Lungs: Clear to auscultation bilaterally without wheezes or crackles  Cardiovascular: Regular rate and rhythm without murmur gallop or rub normal S1 and S2 Abdomen: Nontender, overweight, soft, bowel sounds positive, no rebound, no ascites, no appreciable mass Extremities: No significant cyanosis,  clubbing, or edema bilateral lower extremities  Basic Metabolic Panel:  Recent Labs Lab 06/17/15 0310 06/18/15 0350 06/19/15 0252 06/19/15 1140 06/20/15 0226 06/21/15 0210 06/22/15 0301 06/22/15 1130  NA 137  --   --  135 136 134*  --  137  K 3.4*  --   --  2.6* 2.9* 3.3*  --  4.4  CL 103  --   --  89* 91* 92*  --  98*  CO2 30  --   --  34* 37* 33*  --  30  GLUCOSE 132*  --   --  137* 118* 221*  --  125*  BUN 6  --   --  9 12 17   --  20  CREATININE 1.02  --   --  1.52* 1.58* 1.68*  --  1.34*  CALCIUM 7.7*  --   --  7.6* 7.8* 7.8*  --  9.0  MG 2.3 2.2 2.1  --  2.1 2.0 2.4  --    Liver Function Tests:  Recent Labs Lab 06/21/15 0210  AST 26  ALT 21  ALKPHOS 39  BILITOT 0.5  PROT 6.1*  ALBUMIN 2.5*   CBC:  Recent Labs Lab 06/18/15 0950 06/19/15 0252 06/20/15 0226 06/21/15 0210 06/22/15 0301  WBC 16.9* 12.8* 12.7* 11.1* 10.4  NEUTROABS 12.1*  --   --   --   --   HGB 10.5* 10.6* 11.2* 11.3* 10.8*  HCT 33.4* 33.9* 35.1* 35.4* 34.7*  MCV 89.1 87.8 88.0 87.8 87.8  PLT 334 298 342 330 378   Cardiac Enzymes:  Recent Labs Lab 06/17/15 1902 06/18/15 0350 06/18/15 1200 06/18/15 1850 06/19/15 0252  TROPONINI 0.04* 0.07* 0.03 <0.03 <0.03   BNP (last 3 results)  Recent Labs  06/10/15 1225  BNP 16.0    Recent Results (from the past 240 hour(s))  Clostridium Difficile by PCR (not at [facility])     Status: None   Collection Time: 06/18/15  1:10 PM  Result Value Ref Range Status   Toxigenic C Difficile by pcr NEGATIVE NEGATIVE Final     Time spent in discharge (includes decision making & examination of pt): >35 minutes  06/22/2015, 1:32 PM   Cherene Altes, MD [facility] Office  [phone] Pager [phone]  On-Call/Text Page:      [email or web address]      password [facility]

## 2015-06-22 NOTE — H&P (View-Only) (Signed)
Physical Medicine and Rehabilitation Admission H&P    Chief Complaint  Patient presents with  . Debility    HPI: Sergio Griffin is a 64 y.o. male with history of HTN, diverticulosis, morbid obesity, recent prostatitis, CAD who was admitted via OSH on 06/10/15 with abdominal pain and diarrhea due to colitis as well as CP due to NSTEMI. He was started on IV heparin and IVF for acute on chronic renal failure with lactic acidosis. 2D echo done revealing moderate LVH with EF 60%, aortic sclerosis without stenosis and no pericardial effusion. He has had sleep wake disruption with hypersomnia. He developed bloody stools and Dr. Fuller Plan questioned IBS v/s infectious v/s ischemic colitis. CTA abdomen/pelvis was negative for mesenteric arterial/venous occlusive disease and progressive changes of colitis in distal transverse and descending segments. Colitis felt to be ischemic and IV heparin d/c and patient placed on bowel rest with improvement in symptoms. Patient developed DOE due to pulmonary edema and was started on gentle diuresis with improvement in respiratory status. PT/OT  evaluations done and patient noted to be deconditioned. CIR was recommended for follow up therapy   ROS  Constitutional: Positive for malaise/fatigue (for past 6 months).  HENT: Negative for hearing loss.   Eyes: Positive for blurred vision (limited due to glaucoma).  Respiratory: Positive for shortness of breath (dyspnea with minimal activity for  6 months).   Cardiovascular: Positive for chest pain (chest discomfort for past few months) and palpitations. Negative for leg swelling.  Gastrointestinal: Positive for abdominal pain. Negative for heartburn, nausea and diarrhea.  Genitourinary: Negative for urgency and frequency.  Musculoskeletal: Negative for myalgias and back pain.  Neurological: Positive for dizziness (with positional changes.). Negative for focal weakness and headaches.  Psychiatric/Behavioral: The patient  has insomnia   Past Medical History  Diagnosis Date  . Hypertension   . Reflux   . Glaucoma   . Hyperlipidemia   . CAD (coronary artery disease)     PCI, stent 2011  . Diverticulosis   . Gastric ulcer   . Prostatitis   . Pneumonia 11/2014    Past Surgical History  Procedure Laterality Date  . Cardiac stents    . Eye surgery      History reviewed. No pertinent family history.    Social History:  Lives alone. Medical technologist--disabled due to vision. He reports that he has quit smoking 30 years ago. He does not have any smokeless tobacco history on file. He reports that he does not drink alcohol. His drug history is not on file.    Allergies  Allergen Reactions  . Sulfa Antibiotics Itching    Medications Prior to Admission  Medication Sig Dispense Refill  . aspirin EC 81 MG tablet Take 162 mg by mouth daily.    Marland Kitchen buPROPion (WELLBUTRIN XL) 300 MG 24 hr tablet Take 300 mg by mouth at bedtime.    . clonazePAM (KLONOPIN) 0.5 MG tablet Take 0.5 mg by mouth 2 (two) times daily.    Marland Kitchen latanoprost (XALATAN) 0.005 % ophthalmic solution Place 1 drop into both eyes at bedtime.    Marland Kitchen losartan (COZAAR) 50 MG tablet Take 50 mg by mouth daily.    . metoprolol succinate (TOPROL-XL) 25 MG 24 hr tablet Take 25 mg by mouth daily.    . Omega-3 Fatty Acids (FISH OIL) 1000 MG CAPS Take 1,000 mg by mouth daily.    . promethazine (PHENERGAN) 25 MG tablet Take 25 mg by mouth every 6 (six) hours as  needed for nausea or vomiting.    . rosuvastatin (CRESTOR) 20 MG tablet Take 20 mg by mouth every evening.    . sucralfate (CARAFATE) 1 G tablet Take 1 g by mouth 4 (four) times daily -  with meals and at bedtime.    . Vitamin D, Cholecalciferol, 1000 UNITS TABS Take 1,000 mg by mouth daily.      Home: Home Living Family/patient expects to be discharged to:: Private residence Living Arrangements: Children (son in college; home every other weekend) Available Help at Discharge: Family, Available  PRN/intermittently Type of Home: House Home Access: Stairs to enter Technical brewer of Steps: 1 Home Layout: Two level, 1/2 bath on main level, Bed/bath upstairs (? could put bed in dining room) Alternate Level Stairs-Number of Steps: 14 (2 landings; ) Alternate Level Stairs-Rails: Right Bathroom Shower/Tub: Tub/shower unit, Architectural technologist: Standard (std downstairs; comfort height upstairs) Bathroom Accessibility:  (unsure) Home Equipment: Cane - single point, Walker - 2 wheels, Shower seat   Functional History: Prior Function Level of Independence: Independent with assistive device(s) Comments: Pt reports he struggles to complete ADLs and requires rest breaks.  He states bending forward to reach feet to don/doff socks causes dyspnea  Functional Status:  Mobility: Bed Mobility Overal bed mobility: Needs Assistance Bed Mobility: Sit to Supine Rolling: Modified independent (Device/Increase time) Sidelying to sit: Min guard, HOB elevated Sit to supine: Min assist General bed mobility comments: HOB elevated, cues for positioning assist forrepositioning of LEs in bed  (increased time to perform with cues ) Transfers Overall transfer level: Needs assistance Equipment used: Rolling walker (2 wheeled) Transfers: Sit to/from Stand Sit to Stand: Min guard Stand pivot transfers: Min guard General transfer comment: Pt with + sway.  Requires min guard assist for balance.  Ambulation/Gait Ambulation/Gait assistance: Min assist Ambulation Distance (Feet): 60 Feet (in 20 ft increments with extended standing rest breaks ) Assistive device: Rolling walker (2 wheeled) Gait Pattern/deviations: Step-through pattern, Decreased stride length, Shuffle, Trunk flexed General Gait Details: very slow, shuffling gait; limited by dyspnea and decr muscular endurance (legs tremulous after 15 ft) Gait velocity: very slow, shuffling gait; limited by dyspnea with exertion and BLE fatigue,  standing rest breaks required Gait velocity interpretation: Below normal speed for age/gender    ADL: ADL Overall ADL's : Needs assistance/impaired Eating/Feeding: Independent Grooming: Wash/dry hands, Wash/dry face, Oral care, Brushing hair, Min guard, Standing Upper Body Bathing: Set up, Sitting Lower Body Bathing: Minimal assistance, Sit to/from stand Upper Body Dressing : Set up, Sitting Lower Body Dressing: Minimal assistance, Sit to/from stand Lower Body Dressing Details (indicate cue type and reason): Pt requires min A intermittently  Toilet Transfer: Min guard, Ambulation, Comfort height toilet Toileting- Clothing Manipulation and Hygiene: Minimal assistance, Sit to/from stand Functional mobility during ADLs: Min guard, Minimal assistance, Rolling walker General ADL Comments: requires occasional assist for balance   Cognition: Cognition Overall Cognitive Status: Within Functional Limits for tasks assessed Orientation Level: Oriented X4 Cognition Arousal/Alertness: Awake/alert Behavior During Therapy: Anxious Overall Cognitive Status: Within Functional Limits for tasks assessed   Physical Exam: Blood pressure 117/64, pulse 87, temperature 97.6 F (36.4 C), temperature source Oral, resp. rate 23, height _0  (1.803 m), weight 128 kg (282 lb 3 oz), SpO2 97 %. Physical Exam  Nursing note and vitals reviewed. Constitutional: He is oriented to person, place, and time. He appears well-developed and well-nourished. No distress.  obese  HENT:  Head: Normocephalic and atraumatic.  Eyes: Conjunctivae are normal. Pupils are  equal, round, and reactive to light.  Neck: Normal range of motion. Neck supple. No JVD present. No tracheal deviation present. No thyromegaly present.  Cardiovascular: Normal rate, regular rhythm, normal heart sounds and intact distal pulses.  Exam reveals no friction rub.   No murmur heard. Respiratory: Effort normal and breath sounds normal. No  respiratory distress. He has no wheezes. He has no rales.  GI: Soft. He exhibits no distension. There is no tenderness.  Bowel sounds a bit sluggish  Musculoskeletal: He exhibits edema (1+ edema).  Lymphadenopathy:    He has no cervical adenopathy.  Neurological: He is alert and oriented to person, place, and time. No cranial nerve deficit. He exhibits normal muscle tone. Coordination normal.  Speech clear. Follows commands without difficulty. BUE with intentional tremors. UE strength grossly 4+ to 5/5 deltoids, biceps, triceps, HI.  LE: 4/5 hf, ke and 5/5 adf/apf. Fatigues easily.. No sensory findings. Cognitively appropriate   Skin: Skin is warm and dry. He is not diaphoretic.  Psychiatric: He has a normal mood and affect. His behavior is normal. Judgment and thought content normal.    Results for orders placed or performed during the hospital encounter of 06/10/15 (from the past 48 hour(s))  Comprehensive metabolic panel     Status: Abnormal   Collection Time: 06/21/15  2:10 AM  Result Value Ref Range   Sodium 134 (L) 135 - 145 mmol/L   Potassium 3.3 (L) 3.5 - 5.1 mmol/L   Chloride 92 (L) 101 - 111 mmol/L   CO2 33 (H) 22 - 32 mmol/L   Glucose, Bld 221 (H) 65 - 99 mg/dL   BUN 17 6 - 20 mg/dL   Creatinine, Ser 1.68 (H) 0.61 - 1.24 mg/dL   Calcium 7.8 (L) 8.9 - 10.3 mg/dL   Total Protein 6.1 (L) 6.5 - 8.1 g/dL   Albumin 2.5 (L) 3.5 - 5.0 g/dL   AST 26 15 - 41 U/L   ALT 21 17 - 63 U/L   Alkaline Phosphatase 39 38 - 126 U/L   Total Bilirubin 0.5 0.3 - 1.2 mg/dL   GFR calc non Af Amer 41 (L) >60 mL/min   GFR calc Af Amer 48 (L) >60 mL/min    Comment: (NOTE) The eGFR has been calculated using the CKD EPI equation. This calculation has not been validated in all clinical situations. eGFR's persistently <60 mL/min signify possible Chronic Kidney Disease.    Anion gap 9 5 - 15  CBC     Status: Abnormal   Collection Time: 06/21/15  2:10 AM  Result Value Ref Range   WBC 11.1 (H) 4.0 -  10.5 K/uL   RBC 4.03 (L) 4.22 - 5.81 MIL/uL   Hemoglobin 11.3 (L) 13.0 - 17.0 g/dL   HCT 35.4 (L) 39.0 - 52.0 %   MCV 87.8 78.0 - 100.0 fL   MCH 28.0 26.0 - 34.0 pg   MCHC 31.9 30.0 - 36.0 g/dL   RDW 14.6 11.5 - 15.5 %   Platelets 330 150 - 400 K/uL  Magnesium     Status: None   Collection Time: 06/21/15  2:10 AM  Result Value Ref Range   Magnesium 2.0 1.7 - 2.4 mg/dL  CBC     Status: Abnormal   Collection Time: 06/22/15  3:01 AM  Result Value Ref Range   WBC 10.4 4.0 - 10.5 K/uL   RBC 3.95 (L) 4.22 - 5.81 MIL/uL   Hemoglobin 10.8 (L) 13.0 - 17.0 g/dL   HCT  34.7 (L) 39.0 - 52.0 %   MCV 87.8 78.0 - 100.0 fL   MCH 27.3 26.0 - 34.0 pg   MCHC 31.1 30.0 - 36.0 g/dL   RDW 14.8 11.5 - 15.5 %   Platelets 378 150 - 400 K/uL  Magnesium     Status: None   Collection Time: 06/22/15  3:01 AM  Result Value Ref Range   Magnesium 2.4 1.7 - 2.4 mg/dL  Basic metabolic panel     Status: Abnormal   Collection Time: 06/22/15 11:30 AM  Result Value Ref Range   Sodium 137 135 - 145 mmol/L   Potassium 4.4 3.5 - 5.1 mmol/L   Chloride 98 (L) 101 - 111 mmol/L   CO2 30 22 - 32 mmol/L   Glucose, Bld 125 (H) 65 - 99 mg/dL   BUN 20 6 - 20 mg/dL   Creatinine, Ser 1.34 (H) 0.61 - 1.24 mg/dL   Calcium 9.0 8.9 - 10.3 mg/dL   GFR calc non Af Amer 54 (L) >60 mL/min   GFR calc Af Amer >60 >60 mL/min    Comment: (NOTE) The eGFR has been calculated using the CKD EPI equation. This calculation has not been validated in all clinical situations. eGFR's persistently <60 mL/min signify possible Chronic Kidney Disease.    Anion gap 9 5 - 15   No results found.     Medical Problem List and Plan: 1. Functional deficits secondary to Debility related to ischemic colitis and multiple medical issues.  2.  DVT Prophylaxis/Anticoagulation: Pharmaceutical: Lovenox as bleeding as resolved.  3. Pain Management:  Tylenol prn for pain control. Denies abdominal pain currently.  4. Mood: LCSW to follow for evaluation  and support. Appears motivated and up beat. 5. Neuropsych: This patient is capable of making decisions on his own behalf. 6. Skin/Wound Care:  Encourage patient to boost when in chair. Routine pressure relief measures.  7. Fluids/Electrolytes/Nutrition:  Monitor I/O. Check daily weights. Low salt diet.  8. Ischemic colitis: Rectal bleeding resolved. To continue Cipro and Flagyl --D # 3/7  9. NSTEMI: Echo with normal function. Treated medically with Cardiology following at a distance.  10. Solitary lung nodule: Follow up CT chest in 6-12 months.  11. Acute on chronic renal failure:  Lactic acidosis resolved. Creatinine trending down. Avoid nephrotoxic drugs. Follow serial labs. 12. ABLA: will add iron supplement. Follow up labs 13. Depression with anxiety disorder:  Continue Wellbutrin daily and Klonopin bid. 14. Fluid overload: Resolved.  Off lasix, no edema on exam.   Post Admission Physician Evaluation: 1. Functional deficits secondary  to Debility due to multiple medical issues.  2. Patient is admitted to receive collaborative, interdisciplinary care between the physiatrist, rehab nursing staff, and therapy team. 3. Patient's level of medical complexity and substantial therapy needs in context of that medical necessity cannot be provided at a lesser intensity of care such as a SNF. 4. Patient has experienced substantial functional loss from his/her baseline which was documented above under the "Functional History" and "Functional Status" headings.  Judging by the patient's diagnosis, physical exam, and functional history, the patient has potential for functional progress which will result in measurable gains while on inpatient rehab.  These gains will be of substantial and practical use upon discharge  in facilitating mobility and self-care at the household level. 5. Physiatrist will provide 24 hour management of medical needs as well as oversight of the therapy plan/treatment and provide  guidance as appropriate regarding the interaction of the two.  6. 24 hour rehab nursing will assist with bladder management, bowel management, safety, skin/wound care, disease management, medication administration, pain management and patient education  and help integrate therapy concepts, techniques,education, etc. 7. PT will assess and treat for/with: Lower extremity strength, range of motion, stamina, balance, functional mobility, safety, adaptive techniques and equipment, activity tolerance, community reintegration, ego support, cv observation.   Goals are: mod I. 8. OT will assess and treat for/with: ADL's, functional mobility, safety, upper extremity strength, adaptive techniques and equipment, activity tolerance, community reintegration, education, cv mgt.   Goals are: mod I. Therapy may proceed with showering this patient. 9. SLP will assess and treat for/with: n/a.  Goals are: n/a. 10. Case Management and Social Worker will assess and treat for psychological issues and discharge planning. 11. Team conference will be held weekly to assess progress toward goals and to determine barriers to discharge. 12. Patient will receive at least 3 hours of therapy per day at least 5 days per week. 13. ELOS: 7 days       14. Prognosis:  excellent     Meredith Staggers, MD, [facility] 06/22/2015

## 2015-06-22 NOTE — H&P (Signed)
Physical Medicine and Rehabilitation Admission H&P    Chief Complaint  Patient presents with  . Debility    HPI: Sergio Griffin is a 64 y.o. male with history of HTN, diverticulosis, morbid obesity, recent prostatitis, CAD who was admitted via OSH on 06/10/15 with abdominal pain and diarrhea due to colitis as well as CP due to NSTEMI. He was started on IV heparin and IVF for acute on chronic renal failure with lactic acidosis. 2D echo done revealing moderate LVH with EF 60%, aortic sclerosis without stenosis and no pericardial effusion. He has had sleep wake disruption with hypersomnia. He developed bloody stools and Dr. Fuller Plan questioned IBS v/s infectious v/s ischemic colitis. CTA abdomen/pelvis was negative for mesenteric arterial/venous occlusive disease and progressive changes of colitis in distal transverse and descending segments. Colitis felt to be ischemic and IV heparin d/c and patient placed on bowel rest with improvement in symptoms. Patient developed DOE due to pulmonary edema and was started on gentle diuresis with improvement in respiratory status. PT/OT  evaluations done and patient noted to be deconditioned. CIR was recommended for follow up therapy   ROS  Constitutional: Positive for malaise/fatigue (for past 6 months).  HENT: Negative for hearing loss.   Eyes: Positive for blurred vision (limited due to glaucoma).  Respiratory: Positive for shortness of breath (dyspnea with minimal activity for  6 months).   Cardiovascular: Positive for chest pain (chest discomfort for past few months) and palpitations. Negative for leg swelling.  Gastrointestinal: Positive for abdominal pain. Negative for heartburn, nausea and diarrhea.  Genitourinary: Negative for urgency and frequency.  Musculoskeletal: Negative for myalgias and back pain.  Neurological: Positive for dizziness (with positional changes.). Negative for focal weakness and headaches.  Psychiatric/Behavioral: The patient  has insomnia   Past Medical History  Diagnosis Date  . Hypertension   . Reflux   . Glaucoma   . Hyperlipidemia   . CAD (coronary artery disease)     PCI, stent 2011  . Diverticulosis   . Gastric ulcer   . Prostatitis   . Pneumonia 11/2014    Past Surgical History  Procedure Laterality Date  . Cardiac stents    . Eye surgery      History reviewed. No pertinent family history.    Social History:  Lives alone. Medical technologist--disabled due to vision. He reports that he has quit smoking 30 years ago. He does not have any smokeless tobacco history on file. He reports that he does not drink alcohol. His drug history is not on file.    Allergies  Allergen Reactions  . Sulfa Antibiotics Itching    Medications Prior to Admission  Medication Sig Dispense Refill  . aspirin EC 81 MG tablet Take 162 mg by mouth daily.    Marland Kitchen buPROPion (WELLBUTRIN XL) 300 MG 24 hr tablet Take 300 mg by mouth at bedtime.    . clonazePAM (KLONOPIN) 0.5 MG tablet Take 0.5 mg by mouth 2 (two) times daily.    Marland Kitchen latanoprost (XALATAN) 0.005 % ophthalmic solution Place 1 drop into both eyes at bedtime.    Marland Kitchen losartan (COZAAR) 50 MG tablet Take 50 mg by mouth daily.    . metoprolol succinate (TOPROL-XL) 25 MG 24 hr tablet Take 25 mg by mouth daily.    . Omega-3 Fatty Acids (FISH OIL) 1000 MG CAPS Take 1,000 mg by mouth daily.    . promethazine (PHENERGAN) 25 MG tablet Take 25 mg by mouth every 6 (six) hours as  needed for nausea or vomiting.    . rosuvastatin (CRESTOR) 20 MG tablet Take 20 mg by mouth every evening.    . sucralfate (CARAFATE) 1 G tablet Take 1 g by mouth 4 (four) times daily -  with meals and at bedtime.    . Vitamin D, Cholecalciferol, 1000 UNITS TABS Take 1,000 mg by mouth daily.      Home: Home Living Family/patient expects to be discharged to:: Private residence Living Arrangements: Children (son in college; home every other weekend) Available Help at Discharge: Family, Available  PRN/intermittently Type of Home: House Home Access: Stairs to enter Technical brewer of Steps: 1 Home Layout: Two level, 1/2 bath on main level, Bed/bath upstairs (? could put bed in dining room) Alternate Level Stairs-Number of Steps: 14 (2 landings; ) Alternate Level Stairs-Rails: Right Bathroom Shower/Tub: Tub/shower unit, Architectural technologist: Standard (std downstairs; comfort height upstairs) Bathroom Accessibility:  (unsure) Home Equipment: Cane - single point, Walker - 2 wheels, Shower seat   Functional History: Prior Function Level of Independence: Independent with assistive device(s) Comments: Pt reports he struggles to complete ADLs and requires rest breaks.  He states bending forward to reach feet to don/doff socks causes dyspnea  Functional Status:  Mobility: Bed Mobility Overal bed mobility: Needs Assistance Bed Mobility: Sit to Supine Rolling: Modified independent (Device/Increase time) Sidelying to sit: Min guard, HOB elevated Sit to supine: Min assist General bed mobility comments: HOB elevated, cues for positioning assist forrepositioning of LEs in bed  (increased time to perform with cues ) Transfers Overall transfer level: Needs assistance Equipment used: Rolling walker (2 wheeled) Transfers: Sit to/from Stand Sit to Stand: Min guard Stand pivot transfers: Min guard General transfer comment: Pt with + sway.  Requires min guard assist for balance.  Ambulation/Gait Ambulation/Gait assistance: Min assist Ambulation Distance (Feet): 60 Feet (in 20 ft increments with extended standing rest breaks ) Assistive device: Rolling walker (2 wheeled) Gait Pattern/deviations: Step-through pattern, Decreased stride length, Shuffle, Trunk flexed General Gait Details: very slow, shuffling gait; limited by dyspnea and decr muscular endurance (legs tremulous after 15 ft) Gait velocity: very slow, shuffling gait; limited by dyspnea with exertion and BLE fatigue,  standing rest breaks required Gait velocity interpretation: Below normal speed for age/gender    ADL: ADL Overall ADL's : Needs assistance/impaired Eating/Feeding: Independent Grooming: Wash/dry hands, Wash/dry face, Oral care, Brushing hair, Min guard, Standing Upper Body Bathing: Set up, Sitting Lower Body Bathing: Minimal assistance, Sit to/from stand Upper Body Dressing : Set up, Sitting Lower Body Dressing: Minimal assistance, Sit to/from stand Lower Body Dressing Details (indicate cue type and reason): Pt requires min A intermittently  Toilet Transfer: Min guard, Ambulation, Comfort height toilet Toileting- Clothing Manipulation and Hygiene: Minimal assistance, Sit to/from stand Functional mobility during ADLs: Min guard, Minimal assistance, Rolling walker General ADL Comments: requires occasional assist for balance   Cognition: Cognition Overall Cognitive Status: Within Functional Limits for tasks assessed Orientation Level: Oriented X4 Cognition Arousal/Alertness: Awake/alert Behavior During Therapy: Anxious Overall Cognitive Status: Within Functional Limits for tasks assessed   Physical Exam: Blood pressure 117/64, pulse 87, temperature 97.6 F (36.4 C), temperature source Oral, resp. rate 23, height _0  (1.803 m), weight 128 kg (282 lb 3 oz), SpO2 97 %. Physical Exam  Nursing note and vitals reviewed. Constitutional: He is oriented to person, place, and time. He appears well-developed and well-nourished. No distress.  obese  HENT:  Head: Normocephalic and atraumatic.  Eyes: Conjunctivae are normal. Pupils are  equal, round, and reactive to light.  Neck: Normal range of motion. Neck supple. No JVD present. No tracheal deviation present. No thyromegaly present.  Cardiovascular: Normal rate, regular rhythm, normal heart sounds and intact distal pulses.  Exam reveals no friction rub.   No murmur heard. Respiratory: Effort normal and breath sounds normal. No  respiratory distress. He has no wheezes. He has no rales.  GI: Soft. He exhibits no distension. There is no tenderness.  Bowel sounds a bit sluggish  Musculoskeletal: He exhibits edema (1+ edema).  Lymphadenopathy:    He has no cervical adenopathy.  Neurological: He is alert and oriented to person, place, and time. No cranial nerve deficit. He exhibits normal muscle tone. Coordination normal.  Speech clear. Follows commands without difficulty. BUE with intentional tremors. UE strength grossly 4+ to 5/5 deltoids, biceps, triceps, HI.  LE: 4/5 hf, ke and 5/5 adf/apf. Fatigues easily.. No sensory findings. Cognitively appropriate   Skin: Skin is warm and dry. He is not diaphoretic.  Psychiatric: He has a normal mood and affect. His behavior is normal. Judgment and thought content normal.    Results for orders placed or performed during the hospital encounter of 06/10/15 (from the past 48 hour(s))  Comprehensive metabolic panel     Status: Abnormal   Collection Time: 06/21/15  2:10 AM  Result Value Ref Range   Sodium 134 (L) 135 - 145 mmol/L   Potassium 3.3 (L) 3.5 - 5.1 mmol/L   Chloride 92 (L) 101 - 111 mmol/L   CO2 33 (H) 22 - 32 mmol/L   Glucose, Bld 221 (H) 65 - 99 mg/dL   BUN 17 6 - 20 mg/dL   Creatinine, Ser 1.68 (H) 0.61 - 1.24 mg/dL   Calcium 7.8 (L) 8.9 - 10.3 mg/dL   Total Protein 6.1 (L) 6.5 - 8.1 g/dL   Albumin 2.5 (L) 3.5 - 5.0 g/dL   AST 26 15 - 41 U/L   ALT 21 17 - 63 U/L   Alkaline Phosphatase 39 38 - 126 U/L   Total Bilirubin 0.5 0.3 - 1.2 mg/dL   GFR calc non Af Amer 41 (L) >60 mL/min   GFR calc Af Amer 48 (L) >60 mL/min    Comment: (NOTE) The eGFR has been calculated using the CKD EPI equation. This calculation has not been validated in all clinical situations. eGFR's persistently <60 mL/min signify possible Chronic Kidney Disease.    Anion gap 9 5 - 15  CBC     Status: Abnormal   Collection Time: 06/21/15  2:10 AM  Result Value Ref Range   WBC 11.1 (H) 4.0 -  10.5 K/uL   RBC 4.03 (L) 4.22 - 5.81 MIL/uL   Hemoglobin 11.3 (L) 13.0 - 17.0 g/dL   HCT 35.4 (L) 39.0 - 52.0 %   MCV 87.8 78.0 - 100.0 fL   MCH 28.0 26.0 - 34.0 pg   MCHC 31.9 30.0 - 36.0 g/dL   RDW 14.6 11.5 - 15.5 %   Platelets 330 150 - 400 K/uL  Magnesium     Status: None   Collection Time: 06/21/15  2:10 AM  Result Value Ref Range   Magnesium 2.0 1.7 - 2.4 mg/dL  CBC     Status: Abnormal   Collection Time: 06/22/15  3:01 AM  Result Value Ref Range   WBC 10.4 4.0 - 10.5 K/uL   RBC 3.95 (L) 4.22 - 5.81 MIL/uL   Hemoglobin 10.8 (L) 13.0 - 17.0 g/dL   HCT  34.7 (L) 39.0 - 52.0 %   MCV 87.8 78.0 - 100.0 fL   MCH 27.3 26.0 - 34.0 pg   MCHC 31.1 30.0 - 36.0 g/dL   RDW 14.8 11.5 - 15.5 %   Platelets 378 150 - 400 K/uL  Magnesium     Status: None   Collection Time: 06/22/15  3:01 AM  Result Value Ref Range   Magnesium 2.4 1.7 - 2.4 mg/dL  Basic metabolic panel     Status: Abnormal   Collection Time: 06/22/15 11:30 AM  Result Value Ref Range   Sodium 137 135 - 145 mmol/L   Potassium 4.4 3.5 - 5.1 mmol/L   Chloride 98 (L) 101 - 111 mmol/L   CO2 30 22 - 32 mmol/L   Glucose, Bld 125 (H) 65 - 99 mg/dL   BUN 20 6 - 20 mg/dL   Creatinine, Ser 1.34 (H) 0.61 - 1.24 mg/dL   Calcium 9.0 8.9 - 10.3 mg/dL   GFR calc non Af Amer 54 (L) >60 mL/min   GFR calc Af Amer >60 >60 mL/min    Comment: (NOTE) The eGFR has been calculated using the CKD EPI equation. This calculation has not been validated in all clinical situations. eGFR's persistently <60 mL/min signify possible Chronic Kidney Disease.    Anion gap 9 5 - 15   No results found.     Medical Problem List and Plan: 1. Functional deficits secondary to Debility related to ischemic colitis and multiple medical issues.  2.  DVT Prophylaxis/Anticoagulation: Pharmaceutical: Lovenox as bleeding as resolved.  3. Pain Management:  Tylenol prn for pain control. Denies abdominal pain currently.  4. Mood: LCSW to follow for evaluation  and support. Appears motivated and up beat. 5. Neuropsych: This patient is capable of making decisions on his own behalf. 6. Skin/Wound Care:  Encourage patient to boost when in chair. Routine pressure relief measures.  7. Fluids/Electrolytes/Nutrition:  Monitor I/O. Check daily weights. Low salt diet.  8. Ischemic colitis: Rectal bleeding resolved. To continue Cipro and Flagyl --D # 3/7  9. NSTEMI: Echo with normal function. Treated medically with Cardiology following at a distance.  10. Solitary lung nodule: Follow up CT chest in 6-12 months.  11. Acute on chronic renal failure:  Lactic acidosis resolved. Creatinine trending down. Avoid nephrotoxic drugs. Follow serial labs. 12. ABLA: will add iron supplement. Follow up labs 13. Depression with anxiety disorder:  Continue Wellbutrin daily and Klonopin bid. 14. Fluid overload: Resolved.  Off lasix, no edema on exam.   Post Admission Physician Evaluation: 1. Functional deficits secondary  to Debility due to multiple medical issues.  2. Patient is admitted to receive collaborative, interdisciplinary care between the physiatrist, rehab nursing staff, and therapy team. 3. Patient's level of medical complexity and substantial therapy needs in context of that medical necessity cannot be provided at a lesser intensity of care such as a SNF. 4. Patient has experienced substantial functional loss from his/her baseline which was documented above under the "Functional History" and "Functional Status" headings.  Judging by the patient's diagnosis, physical exam, and functional history, the patient has potential for functional progress which will result in measurable gains while on inpatient rehab.  These gains will be of substantial and practical use upon discharge  in facilitating mobility and self-care at the household level. 5. Physiatrist will provide 24 hour management of medical needs as well as oversight of the therapy plan/treatment and provide  guidance as appropriate regarding the interaction of the two.  6. 24 hour rehab nursing will assist with bladder management, bowel management, safety, skin/wound care, disease management, medication administration, pain management and patient education  and help integrate therapy concepts, techniques,education, etc. 7. PT will assess and treat for/with: Lower extremity strength, range of motion, stamina, balance, functional mobility, safety, adaptive techniques and equipment, activity tolerance, community reintegration, ego support, cv observation.   Goals are: mod I. 8. OT will assess and treat for/with: ADL's, functional mobility, safety, upper extremity strength, adaptive techniques and equipment, activity tolerance, community reintegration, education, cv mgt.   Goals are: mod I. Therapy may proceed with showering this patient. 9. SLP will assess and treat for/with: n/a.  Goals are: n/a. 10. Case Management and Social Worker will assess and treat for psychological issues and discharge planning. 11. Team conference will be held weekly to assess progress toward goals and to determine barriers to discharge. 12. Patient will receive at least 3 hours of therapy per day at least 5 days per week. 13. ELOS: 7 days       14. Prognosis:  excellent     Meredith Staggers, MD, [facility] 06/22/2015

## 2015-06-23 ENCOUNTER — Encounter (HOSPITAL_COMMUNITY): Payer: Self-pay

## 2015-06-23 ENCOUNTER — Inpatient Hospital Stay (HOSPITAL_COMMUNITY): Payer: Medicare Other | Admitting: Rehabilitation

## 2015-06-23 ENCOUNTER — Inpatient Hospital Stay (HOSPITAL_COMMUNITY): Payer: Medicare Other

## 2015-06-23 ENCOUNTER — Inpatient Hospital Stay (HOSPITAL_COMMUNITY): Payer: Medicare Other | Admitting: Occupational Therapy

## 2015-06-23 LAB — CBC WITH DIFFERENTIAL/PLATELET
BASOS ABS: 0.1 10*3/uL (ref 0.0–0.1)
BASOS PCT: 1 % (ref 0–1)
Eosinophils Absolute: 0.3 10*3/uL (ref 0.0–0.7)
Eosinophils Relative: 2 % (ref 0–5)
HEMATOCRIT: 34 % — AB (ref 39.0–52.0)
Hemoglobin: 10.6 g/dL — ABNORMAL LOW (ref 13.0–17.0)
Lymphocytes Relative: 26 % (ref 12–46)
Lymphs Abs: 2.9 10*3/uL (ref 0.7–4.0)
MCH: 27.5 pg (ref 26.0–34.0)
MCHC: 31.2 g/dL (ref 30.0–36.0)
MCV: 88.3 fL (ref 78.0–100.0)
MONO ABS: 0.9 10*3/uL (ref 0.1–1.0)
Monocytes Relative: 8 % (ref 3–12)
NEUTROS PCT: 63 % (ref 43–77)
Neutro Abs: 7.1 10*3/uL (ref 1.7–7.7)
Platelets: 417 10*3/uL — ABNORMAL HIGH (ref 150–400)
RBC: 3.85 MIL/uL — AB (ref 4.22–5.81)
RDW: 15.3 % (ref 11.5–15.5)
WBC: 11.1 10*3/uL — ABNORMAL HIGH (ref 4.0–10.5)

## 2015-06-23 LAB — COMPREHENSIVE METABOLIC PANEL
ALK PHOS: 33 U/L — AB (ref 38–126)
ALT: 18 U/L (ref 17–63)
ANION GAP: 4 — AB (ref 5–15)
AST: 22 U/L (ref 15–41)
Albumin: 2.6 g/dL — ABNORMAL LOW (ref 3.5–5.0)
BUN: 20 mg/dL (ref 6–20)
CALCIUM: 8.5 mg/dL — AB (ref 8.9–10.3)
CHLORIDE: 101 mmol/L (ref 101–111)
CO2: 31 mmol/L (ref 22–32)
Creatinine, Ser: 1.33 mg/dL — ABNORMAL HIGH (ref 0.61–1.24)
GFR calc non Af Amer: 55 mL/min — ABNORMAL LOW (ref >60)
GLUCOSE: 114 mg/dL — AB (ref 65–99)
Potassium: 4.4 mmol/L (ref 3.5–5.1)
SODIUM: 136 mmol/L (ref 135–145)
TOTAL PROTEIN: 6.4 g/dL — AB (ref 6.5–8.1)
Total Bilirubin: 0.4 mg/dL (ref 0.3–1.2)

## 2015-06-23 NOTE — Progress Notes (Signed)
Occupational Therapy Note  Patient Details  Name: Sergio Griffin MRN: [id number] Date of Birth: 08-11-1951  Today's Date: 06/23/2015 OT Individual Time: 1330-1430 OT Individual Time Calculation (min): 60 min   Pt denied pain Individual Therapy  Pt resting in w/c upon arrival and agreeable to therapy.  Pt initially exhibited difficulty in recalling activities from AM therapy but was able to recall after current therapy session commenced.  Pt amb with RW to ADL apartment and practiced stepping over into tub to simulate home environment.  Pt required steady A using grab bars.  Pt stated he did not have grab bars at home but was planning on installing when he gets back home.  Pt amb with RW to therapy gym and engaged in functional amb with RW for home mgmt tasks and retrieving items from floor with reacher.  Pt SOB after activities and requires rest breaks.  Pt returned to room and remained in w/c with all needs within reach.   Leotis Shames [facility] 06/23/2015, 2:51 PM

## 2015-06-23 NOTE — Evaluation (Signed)
Physical Therapy Assessment and Plan  Patient Details  Name: Sergio Griffin MRN: [id number] Date of Birth: 01-22-1951  PT Diagnosis: Abnormal posture, Difficulty walking and Muscle weakness Rehab Potential: Good ELOS: 7 days   Today's Date: 06/23/2015 PT Individual Time: 0900-1000 PT Individual Time Calculation (min): 60 min    Problem List:  Patient Active Problem List   Diagnosis Date Noted  . Physical debility 06/22/2015  . DOE (dyspnea on exertion)   . Acute on chronic renal failure   . Hematochezia   . Abnormal CT scan, colon   . Acute pulmonary edema   . Hypokalemia   . Acute blood loss anemia   . Ischemic colitis   . Essential hypertension   . Infectious colitis   . Acute cystitis without hematuria   . Pulmonary nodule   . Abdominal pain, acute   . Blood poisoning   . Hyperkalemia 06/11/2015  . CAD (coronary artery disease), native coronary artery 06/11/2015  . Wide-complex tachycardia 06/11/2015  . Abdominal pain, generalized   . CAD in native artery   . Elevated troponin   . UTI (lower urinary tract infection)   . Solitary pulmonary nodule   . Colitis 06/10/2015  . NSTEMI (non-ST elevated myocardial infarction) 06/10/2015  . Acute on chronic kidney failure 06/10/2015  . Lactic acidosis 06/10/2015    Past Medical History:  Past Medical History  Diagnosis Date  . Hypertension   . Reflux   . Glaucoma   . Hyperlipidemia   . CAD (coronary artery disease)     PCI, stent 2011  . Diverticulosis   . Gastric ulcer   . Prostatitis   . Pneumonia 11/2014  . Depression    Past Surgical History:  Past Surgical History  Procedure Laterality Date  . Cardiac stents    . Eye surgery      Assessment & Plan Clinical Impression: Patient is a 64 y.o. year old male with history of HTN, diverticulosis, morbid obesity, recent prostatitis, CAD who was admitted via OSH on 06/10/15 with abdominal pain and diarrhea due to colitis as well as CP due to NSTEMI. He was  started on IV heparin and IVF for acute on chronic renal failure with lactic acidosis. 2D echo done revealing moderate LVH with EF 60%, aortic sclerosis without stenosis and no pericardial effusion. He has had sleep wake disruption with hypersomnia. He developed bloody stools and Dr. Fuller Plan questioned IBS v/s infectious v/s ischemic colitis. CTA abdomen/pelvis was negative for mesenteric arterial/venous occlusive disease and progressive changes of colitis in distal transverse and descending segments. Colitis felt to be ischemic and IV heparin d/c and patient placed on bowel rest with improvement in symptoms. Patient developed DOE due to pulmonary edema and was started on gentle diuresis with improvement in respiratory status. recent admission to the hospital.  Patient transferred to CIR on 06/22/2015.   Patient currently requires min with mobility secondary to muscle weakness and decreased cardiorespiratoy endurance.  Prior to hospitalization, patient was modified independent  with mobility and lived with Alone in a House home.  Home access is 1Stairs to enter.  Patient will benefit from skilled PT intervention to maximize safe functional mobility and minimize fall risk for planned discharge home with intermittent assist.  Anticipate patient will benefit from follow up [name] vs OP at discharge.  PT - End of Session Activity Tolerance: Tolerates 30+ min activity with multiple rests Endurance Deficit: Yes Endurance Deficit Description: pt requires multiple rest breaks PT Assessment Rehab Potential (ACUTE/IP  ONLY): Good Barriers to Discharge: Decreased caregiver support PT Patient demonstrates impairments in the following area(s): Balance;Endurance;Motor;Safety PT Transfers Functional Problem(s): Bed Mobility;Bed to Chair;Car;Furniture PT Locomotion Functional Problem(s): Ambulation;Stairs PT Plan PT Intensity: Minimum of 1-2 x/day ,45 to 90 minutes PT Frequency: 5 out of 7 days PT Duration Estimated  Length of Stay: 7 days PT Treatment/Interventions: Ambulation/gait training;Balance/vestibular training;Community reintegration;Discharge planning;Disease management/prevention;DME/adaptive equipment instruction;Functional mobility training;Patient/family education;Psychosocial support;Stair training;Therapeutic Activities;Therapeutic Exercise;UE/LE Strength taining/ROM;Wheelchair propulsion/positioning PT Transfers Anticipated Outcome(s): mod I  PT Locomotion Anticipated Outcome(s): mod I  PT Recommendation Follow Up Recommendations: Home health PT;Outpatient PT Patient destination: Home Equipment Recommended: To be determined  Skilled Therapeutic Intervention PT assessment and evaluation performed, see details below.  Initiated w/c mobility training for quality as well as strengthening and endurance.  Tolerated well.  Performed gait throughout session with use of RW at min A level.  Pt needs seated rest breaks but able to ambulate up to 30' at a time.  SaO2 remained in 90's throughout session.  Also performed stairs, see below.  Provided education on increasing activity tolerance along with energy conservation techniques for safety.  Discussed ELOS, expected outcomes, rehab schedule, etc.  Pt verbalized understanding.    PT Evaluation Precautions/Restrictions Precautions Precautions: Fall Precaution Comments: pt reports fall 3 mos PTA due to poor vision and tripped Restrictions Weight Bearing Restrictions: No General Chart Reviewed: No Family/Caregiver Present: No Vital Signs  Pain Pain Assessment Pain Assessment: No/denies pain Pain Score: 0-No pain Home Living/Prior Functioning Home Living Living Arrangements: Alone Available Help at Discharge: Family;Available PRN/intermittently Type of Home: House Home Access: Stairs to enter Entrance Stairs-Number of Steps: 1 Entrance Stairs-Rails: None Home Layout: Two level;Bed/bath upstairs;1/2 bath on main level Alternate Level  Stairs-Number of Steps: 2 then landing, 12, then another landing then 1-2 more Alternate Level Stairs-Rails: Right Bathroom Shower/Tub: Tub/shower unit;Curtain Bathroom Toilet: Handicapped height Additional Comments: has shower chair that he was using inside of tub  Lives With: Alone Prior Function Level of Independence: Independent with basic ADLs;Independent with gait;Independent with transfers  Able to Take Stairs?: Yes Driving: Yes Vocation: Retired Leisure: Hobbies-yes (Comment) (likes to paint, read, write) Comments: Reports he was performing all ADLs however was taking a long time to complete even single task at home.   Vision/Perception  Vision - Assessment Additional Comments: reports that he sees only 35-40% in L eye and about 80% in R eye and peripheral vision is also impaired.   Cognition Overall Cognitive Status: Within Functional Limits for tasks assessed Arousal/Alertness: Awake/alert Orientation Level: Oriented X4 Attention: Alternating;Selective Selective Attention: Appears intact Awareness: Appears intact Problem Solving: Appears intact Safety/Judgment: Appears intact Sensation Sensation Light Touch: Impaired Detail (intact in BUE, B feet light touch duller but is intact) Light Touch Impaired Details: Impaired LLE;Impaired RLE Stereognosis: Not tested Hot/Cold: Appears Intact Proprioception: Appears Intact Additional Comments: B feet light touch impaired (duller) but is intact.  Coordination Gross Motor Movements are Fluid and Coordinated: Yes Fine Motor Movements are Fluid and Coordinated: No (pt reports decreased fine motor in UE) Finger Nose Finger Test: mild overshooting, most likely due to visual deficits Heel Shin Test: grossly [facility] Motor  Motor Motor: Other (comment);Abnormal postural alignment and control Motor - Skilled Clinical Observations: Pt with generalized weakness and decreased balance  Mobility Bed Mobility Bed Mobility: Supine to Sit;Sit  to Supine Supine to Sit: 5: Supervision Supine to Sit Details: Verbal cues for sequencing;Verbal cues for technique;Verbal cues for precautions/safety Sit to Supine: 5: Supervision Sit to Supine -  Details: Verbal cues for sequencing;Verbal cues for technique;Verbal cues for precautions/safety Transfers Transfers: Yes Sit to Stand: 4: Min assist Sit to Stand Details: Verbal cues for sequencing;Verbal cues for technique;Verbal cues for precautions/safety;Manual facilitation for weight shifting Stand to Sit: 4: Min assist Stand to Sit Details (indicate cue type and reason): Verbal cues for sequencing;Verbal cues for technique;Verbal cues for precautions/safety;Manual facilitation for weight shifting Stand Pivot Transfers: 4: Min assist Stand Pivot Transfer Details: Verbal cues for sequencing;Verbal cues for technique;Verbal cues for precautions/safety;Manual facilitation for weight shifting Locomotion  Ambulation Ambulation: Yes Ambulation/Gait Assistance: 4: Min guard Ambulation Distance (Feet): 30 Feet (x 2 reps and another 25') Assistive device: Rolling walker Gait Gait: Yes Gait Pattern: Impaired Gait Pattern: Step-through pattern;Decreased stride length;Shuffle;Trunk flexed Stairs / Additional Locomotion Stairs: Yes Stairs Assistance: 4: Min assist Stairs Assistance Details: Verbal cues for sequencing;Verbal cues for technique;Verbal cues for precautions/safety;Manual facilitation for weight shifting Stair Management Technique: Two rails;Step to pattern;Forwards Number of Stairs: 12 Height of Stairs: 3 (and 6) Architect: Yes Wheelchair Assistance: 5: Investment banker, operational Details: Verbal cues for sequencing;Verbal cues for Marketing executive: Both lower extermities Wheelchair Parts Management: Supervision/cueing Distance: 150  Trunk/Postural Assessment  Cervical Assessment Cervical Assessment: Exceptions to [facility] Cervical  Strength Overall Cervical Strength Comments: forward flexed posture Thoracic Assessment Thoracic Assessment: Exceptions to [facility] Thoracic Strength Overall Thoracic Strength Comments: forward rounded shoulders Lumbar Assessment Lumbar Assessment: Exceptions to [facility] Lumbar Strength Overall Lumbar Strength Comments: posterior pelvic tilt  Postural Control Postural Control: Deficits on evaluation Postural Limitations: Pt with forward flexed posture, very rigid and limited balance reactions.  Balance Balance Balance Assessed: Yes Static Standing Balance Static Standing - Level of Assistance: 5: Stand by assistance Dynamic Standing Balance Dynamic Standing - Balance Support: During functional activity;Left upper extremity supported Dynamic Standing - Level of Assistance: 4: Min assist Extremity Assessment  RUE Assessment RUE Assessment: Within Functional Limits (strength grossly 5/5) LUE Assessment LUE Assessment: Within Functional Limits (strength grossly 5/5) RLE Assessment RLE Assessment: Within Functional Limits (grossly 3 to 3+/5) LLE Assessment LLE Assessment: Within Functional Limits (grossly 3 to 3+/5)  FIM:  FIM - Bed/Chair Transfer Bed/Chair Transfer Assistive Devices: Copy: 5: Supine > Sit: Supervision (verbal cues/safety issues);5: Sit > Supine: Supervision (verbal cues/safety issues);4: Bed > Chair or W/C: Min A (steadying Pt. > 75%);4: Chair or W/C > Bed: Min A (steadying Pt. > 75%) FIM - Locomotion: Wheelchair Distance: 150 Locomotion: Wheelchair: 5: Travels 150 ft or more: maneuvers on rugs and over door sills with supervision, cueing or coaxing FIM - Locomotion: Ambulation Locomotion: Ambulation Assistive Devices: Administrator Ambulation/Gait Assistance: 4: Min guard Locomotion: Ambulation: 1: Travels less than 50 ft with minimal assistance (Pt.>75%) FIM - Locomotion: Stairs Locomotion: Scientist, physiological: Hand rail - 2 Locomotion:  Stairs: 4: Up and Down 12 - 14 stairs with minimal assistance (Pt.>75%)   Refer to Care Plan for Long Term Goals  Recommendations for other services: None  Discharge Criteria: Patient will be discharged from PT if patient refuses treatment 3 consecutive times without medical reason, if treatment goals not met, if there is a change in medical status, if patient makes no progress towards goals or if patient is discharged from hospital.  The above assessment, treatment plan, treatment alternatives and goals were discussed and mutually agreed upon: by patient  Denice Bors 06/23/2015, 12:12 PM

## 2015-06-23 NOTE — Progress Notes (Signed)
[facility] PHYSICAL MEDICINE & REHABILITATION     PROGRESS NOTE    Subjective/Complaints: Had a very good night. Appetite good. No sob, cough, belly pain  ROS: Pt denies fever, rash/itching, headache, blurred or double vision, nausea, vomiting,   diarrhea, chest pain,  , palpitations, dysuria, dizziness, neck or back pain, bleeding, anxiety, or depression   Objective: Vital Signs: Blood pressure 121/70, pulse 71, temperature 98.2 F (36.8 C), temperature source Oral, resp. rate 18, height 5\' 11"  (1.803 m), weight 134.582 kg (296 lb 11.2 oz), SpO2 94 %. No results found.  Recent Labs  06/22/15 0301 06/23/15 0520  WBC 10.4 11.1*  HGB 10.8* 10.6*  HCT 34.7* 34.0*  PLT 378 417*    Recent Labs  06/22/15 1130 06/23/15 0520  NA 137 136  K 4.4 4.4  CL 98* 101  GLUCOSE 125* 114*  BUN 20 20  CREATININE 1.34* 1.33*  CALCIUM 9.0 8.5*   CBG (last 3)  No results for input(s): GLUCAP in the last 72 hours.  Wt Readings from Last 3 Encounters:  06/23/15 134.582 kg (296 lb 11.2 oz)  06/22/15 128 kg (282 lb 3 oz)    Physical Exam:  Constitutional: He is oriented to person, place, and time. He appears well-developed and well-nourished. No distress.  obese  HENT:  Head: Normocephalic and atraumatic.  Eyes: Conjunctivae are normal. Pupils are equal, round, and reactive to light.  Neck: Normal range of motion. Neck supple. No JVD present. No tracheal deviation present. No thyromegaly present.  Cardiovascular: Normal rate, regular rhythm, normal heart sounds and intact distal pulses. Exam reveals no friction rub.  No murmur heard. Respiratory: Effort normal and breath sounds normal. No respiratory distress. He has no wheezes. He has no rales.  GI: Soft. He exhibits no distension. There is no tenderness.  Bowel sounds remain sluggish  Musculoskeletal: He exhibits edema (trace edema).  Lymphadenopathy:   He has no cervical adenopathy.  Neurological: He is alert and oriented  to person, place, and time. No cranial nerve deficit. He exhibits normal muscle tone. Coordination normal.  Speech clear. Follows commands without difficulty. BUE with intentional tremors. UE strength grossly 4+ to 5/5 deltoids, biceps, triceps, HI. LE: 4/5 hf, ke and 5/5 adf/apf. No sensory findings. Cognitively appropriate  Skin: Skin is warm and dry. He is not diaphoretic.  Psychiatric: He has a normal mood and affect. His behavior is normal. Judgment and thought content normal.     Assessment/Plan: 1. Functional deficits secondary to debility related to ischemic colitis which require 3+ hours per day of interdisciplinary therapy in a comprehensive inpatient rehab setting. Physiatrist is providing close team supervision and 24 hour management of active medical problems listed below. Physiatrist and rehab team continue to assess barriers to discharge/monitor patient progress toward functional and medical goals. FIM:                   Comprehension Comprehension Mode: Auditory Comprehension: 5-Understands complex 90% of the time/Cues < 10% of the time  Expression Expression Mode: Verbal Expression: 5-Expresses complex 90% of the time/cues < 10% of the time  Social Interaction Social Interaction: 7-Interacts appropriately with others - No medications needed.  Problem Solving Problem Solving: 5-Solves complex 90% of the time/cues < 10% of the time  Memory Memory: 7-Complete Independence: No helper  Medical Problem List and Plan: 1. Functional deficits secondary to Debility related to ischemic colitis and multiple medical issues.   -begin therapies today 2. DVT Prophylaxis/Anticoagulation: Pharmaceutical: Lovenox  resumed and indicated until mobility increases 3. Pain Management: Tylenol prn for pain control. Denies abdominal pain currently.  4. Mood: LCSW to follow for evaluation and support. Appears motivated and up beat. 5. Neuropsych: This patient is capable  of making decisions on his own behalf. 6. Skin/Wound Care: Encourage patient to boost when in chair. Routine pressure relief measures.  7. Fluids/Electrolytes/Nutrition: Monitor I/O. Checking daily weights. Low salt diet to continue 8. Ischemic colitis: Rectal bleeding resolved. To continue Cipro and Flagyl --thru Sunday 8/7  -needs bm  By tomorrow 9. NSTEMI: Echo with normal function. Treated medically with Cardiology following at a distance.  10. Solitary lung nodule: Follow up CT chest in 6-12 months.  11. Acute on chronic renal failure: Lactic acidosis resolved. Creatinine trending down. Avoid nephrotoxic drugs.   -labs all personally reviewed today---Cr still improving 12. ABLA: continue iron supplement. Follow up labs reviewed 13. Depression with anxiety disorder: Continue Wellbutrin daily and Klonopin bid. 14. Fluid overload: Resolved. Off lasix, minimal edema on exam   LOS (Days) 1 A FACE TO FACE EVALUATION WAS PERFORMED  Haider Hornaday T 06/23/2015 8:39 AM

## 2015-06-23 NOTE — Evaluation (Signed)
Occupational Therapy Assessment and Plan  Patient Details  Name: Sergio Griffin MRN: [id number] Date of Birth: 03/31/1951  OT Diagnosis: blindness and low vision and muscle weakness (generalized) Rehab Potential: Rehab Potential (ACUTE ONLY): Good ELOS: 7 days   Today's Date: 06/23/2015 OT Individual Time: 1035-1200 OT Individual Time Calculation (min): 85 min     Problem List:  Patient Active Problem List   Diagnosis Date Noted  . Physical debility 06/22/2015  . DOE (dyspnea on exertion)   . Acute on chronic renal failure   . Hematochezia   . Abnormal CT scan, colon   . Acute pulmonary edema   . Hypokalemia   . Acute blood loss anemia   . Ischemic colitis   . Essential hypertension   . Infectious colitis   . Acute cystitis without hematuria   . Pulmonary nodule   . Abdominal pain, acute   . Blood poisoning   . Hyperkalemia 06/11/2015  . CAD (coronary artery disease), native coronary artery 06/11/2015  . Wide-complex tachycardia 06/11/2015  . Abdominal pain, generalized   . CAD in native artery   . Elevated troponin   . UTI (lower urinary tract infection)   . Solitary pulmonary nodule   . Colitis 06/10/2015  . NSTEMI (non-ST elevated myocardial infarction) 06/10/2015  . Acute on chronic kidney failure 06/10/2015  . Lactic acidosis 06/10/2015    Past Medical History:  Past Medical History  Diagnosis Date  . Hypertension   . Reflux   . Glaucoma   . Hyperlipidemia   . CAD (coronary artery disease)     PCI, stent 2011  . Diverticulosis   . Gastric ulcer   . Prostatitis   . Pneumonia 11/2014  . Depression    Past Surgical History:  Past Surgical History  Procedure Laterality Date  . Cardiac stents    . Eye surgery      Assessment & Plan Clinical Impression: Patient is a 64 y.o. right handed male with history of HTN, diverticulosis, morbid obesity, recent prostatitis, CAD who was admitted via OSH on 06/10/15 with abdominal pain and diarrhea due to colitis  as well as CP due to NSTEMI. He was started on IV heparin and IVF for acute on chronic renal failure with lactic acidosis. 2D echo done revealing moderate LVH with EF 60%, aortic sclerosis without stenosis and no pericardial effusion. He has had sleep wake disruption with hypersomnia. He developed bloody stools and Dr. Fuller Plan questioned IBS v/s infectious v/s ischemic colitis. CTA abdomen/pelvis was negative for mesenteric arterial/venous occlusive disease and progressive changes of colitis in distal transverse and descending segments. Colitis felt to be ischemic and IV heparin d/c and patient placed on bowel rest with improvement in symptoms. Patient developed DOE due to pulmonary edema and was started on gentle diuresis with improvement in respiratory status. PT/OT evaluations done and patient noted to be deconditioned. CIR was recommended for follow up therapy.   Patient transferred to CIR on 06/22/2015 .    Patient currently requires min with basic self-care skills secondary to decreased cardiorespiratoy endurance and decreased standing balance.  Prior to hospitalization, patient could complete ADLs with independent, but required increased time with ADLs.  Patient will benefit from skilled intervention to increase independence with basic self-care skills and increase level of independence with iADL prior to discharge home independently.  Anticipate patient will require intermittent supervision and no further OT follow recommended.  OT - End of Session Activity Tolerance: Tolerates 30+ min activity with multiple rests Endurance  Deficit: Yes Endurance Deficit Description: pt requires multiple rest breaks OT Assessment Rehab Potential (ACUTE ONLY): Good OT Patient demonstrates impairments in the following area(s): Balance;Endurance;Safety OT Basic ADL's Functional Problem(s): Grooming;Bathing;Dressing;Toileting OT Advanced ADL's Functional Problem(s): Simple Meal Preparation;Light Housekeeping OT  Transfers Functional Problem(s): Toilet;Tub/Shower OT Additional Impairment(s): None OT Plan OT Intensity: Minimum of 1-2 x/day, 45 to 90 minutes OT Frequency: 5 out of 7 days OT Duration/Estimated Length of Stay: 7 days OT Treatment/Interventions: Balance/vestibular training;Discharge planning;DME/adaptive equipment instruction;Functional mobility training;Patient/family education;Psychosocial support;Self Care/advanced ADL retraining;Therapeutic Activities;Therapeutic Exercise;UE/LE Strength taining/ROM OT Self Feeding Anticipated Outcome(s): no goal OT Basic Self-Care Anticipated Outcome(s): Mod I OT Toileting Anticipated Outcome(s): Mod I OT Bathroom Transfers Anticipated Outcome(s): Mod I OT Recommendation Patient destination: Home Follow Up Recommendations: None Equipment Recommended: To be determined   Skilled Therapeutic Intervention OT eval completed with discussion of rehab process, OT purpose, POC, goals, and ELOS.  ADL assessment completed at sit > stand level with focus on overall activity tolerance and endurance.  Pt min guard with all mobility and transfers, requiring increased time and rest breaks throughout session.  Pt with difficulty reaching feet for washing and when donning socks and shoes, requiring assist.  Educated pt on use of step stool to increase ability to reach feet when donning socks and shoes, with plan to attempt during future session.    OT Evaluation Precautions/Restrictions  Precautions Precautions: Fall Precaution Comments: pt reports fall 3 mos PTA due to poor vision and tripped Restrictions Weight Bearing Restrictions: No Pain Pain Assessment Pain Assessment: No/denies pain Pain Score: 0-No pain Home Living/Prior Functioning Home Living Family/patient expects to be discharged to:: Private residence Living Arrangements: Alone Available Help at Discharge: Family, Available PRN/intermittently Type of Home: House Home Access: Stairs to  enter Technical brewer of Steps: 1 Entrance Stairs-Rails: None Home Layout: Two level, Bed/bath upstairs, 1/2 bath on main level Alternate Level Stairs-Number of Steps: 2 then landing, 12, then another landing then 1-2 more Alternate Level Stairs-Rails: Right Bathroom Shower/Tub: Tub/shower unit, Architectural technologist: Handicapped height Additional Comments: has shower chair that he was using inside of tub  Lives With: Alone IADL History Homemaking Responsibilities: Yes Meal Prep Responsibility: Primary Laundry Responsibility: Primary Cleaning Responsibility: Primary Bill Paying/Finance Responsibility: Primary Shopping Responsibility: Secondary Current License: Yes Mode of Transportation: Car Occupation: Retired Prior Function Level of Independence: Independent with basic ADLs, Independent with gait, Independent with transfers  Able to Take Stairs?: Yes Driving: Yes Vocation: Retired Leisure: Hobbies-yes (Comment) (likes to paint, read, write) Comments: Reports he was performing all ADLs however was taking a long time to complete even single task at home.   ADL  See FIM Vision/Perception  Vision- History Baseline Vision/History: Wears glasses;Glaucoma Wears Glasses: At all times Patient Visual Report: No change from baseline Vision- Assessment Vision Assessment?: Vision impaired- to be further tested in functional context Additional Comments: reports that he sees only 35-40% in L eye and about 80% in R eye and peripheral vision is also impaired.   Cognition Overall Cognitive Status: Within Functional Limits for tasks assessed Arousal/Alertness: Awake/alert Orientation Level: Person;Place;Situation Person: Oriented Place: Oriented Situation: Oriented Year: 2016 Month: August Day of Week: Correct Immediate Memory Recall: Sock;Blue;Bed Memory Recall: Sock;Blue;Bed Memory Recall Sock: Without Cue Memory Recall Blue: Without Cue Memory Recall Bed: Without  Cue Attention: Alternating;Selective Selective Attention: Appears intact Awareness: Appears intact Problem Solving: Appears intact Safety/Judgment: Appears intact Sensation Sensation Light Touch: Impaired Detail (intact in BUE, B feet light touch duller but is intact)  Light Touch Impaired Details: Impaired LLE;Impaired RLE Stereognosis: Not tested Hot/Cold: Appears Intact Proprioception: Appears Intact Additional Comments: B feet light touch impaired (duller) but is intact.  Coordination Gross Motor Movements are Fluid and Coordinated: Yes Fine Motor Movements are Fluid and Coordinated: No (pt reports decreased fine motor in UE) Finger Nose Finger Test: mild overshooting, most likely due to visual deficits Heel Shin Test: grossly [facility] Motor  Motor Motor: Other (comment);Abnormal postural alignment and control Motor - Skilled Clinical Observations: Pt with generalized weakness and decreased balance Mobility  Bed Mobility Bed Mobility: Supine to Sit;Sit to Supine Supine to Sit: 5: Supervision Supine to Sit Details: Verbal cues for sequencing;Verbal cues for technique;Verbal cues for precautions/safety Sit to Supine: 5: Supervision Sit to Supine - Details: Verbal cues for sequencing;Verbal cues for technique;Verbal cues for precautions/safety Transfers Sit to Stand: 4: Min assist Sit to Stand Details: Verbal cues for sequencing;Verbal cues for technique;Verbal cues for precautions/safety;Manual facilitation for weight shifting Stand to Sit: 4: Min assist Stand to Sit Details (indicate cue type and reason): Verbal cues for sequencing;Verbal cues for technique;Verbal cues for precautions/safety;Manual facilitation for weight shifting  Trunk/Postural Assessment  Cervical Assessment Cervical Assessment: Exceptions to [facility] Cervical Strength Overall Cervical Strength Comments: forward flexed posture Thoracic Assessment Thoracic Assessment: Exceptions to [facility] Thoracic Strength Overall  Thoracic Strength Comments: forward rounded shoulders Lumbar Assessment Lumbar Assessment: Exceptions to [facility] Lumbar Strength Overall Lumbar Strength Comments: posterior pelvic tilt  Postural Control Postural Control: Deficits on evaluation Postural Limitations: Pt with forward flexed posture, very rigid and limited balance reactions.  Balance Balance Balance Assessed: Yes Static Standing Balance Static Standing - Level of Assistance: 5: Stand by assistance Dynamic Standing Balance Dynamic Standing - Balance Support: During functional activity;Left upper extremity supported Dynamic Standing - Level of Assistance: 4: Min assist Extremity/Trunk Assessment RUE Assessment RUE Assessment: Within Functional Limits (strength grossly 5/5) LUE Assessment LUE Assessment: Within Functional Limits (strength grossly 5/5)  FIM:  FIM - Bathing Bathing Steps Patient Completed: Chest;Right Arm;Left Arm;Abdomen;Front perineal area;Buttocks;Right upper leg;Left upper leg Bathing: 4: Min-Patient completes 8-9 [id number] parts or 75+ percent FIM - Upper Body Dressing/Undressing Upper body dressing/undressing steps patient completed: Thread/unthread right sleeve of pullover shirt/dresss;Thread/unthread left sleeve of pullover shirt/dress;Put head through opening of pull over shirt/dress;Pull shirt over trunk Upper body dressing/undressing: 5: Set-up assist to: Obtain clothing/put away FIM - Lower Body Dressing/Undressing Lower body dressing/undressing steps patient completed: Thread/unthread left underwear leg;Pull underwear up/down;Thread/unthread right pants leg;Thread/unthread right underwear leg;Thread/unthread left pants leg;Pull pants up/down;Don/Doff right shoe;Don/Doff left shoe Lower body dressing/undressing: 3: Mod-Patient completed 50-74% of tasks FIM - BControl and instrumentation engineerDevices: WCopy 5: Supine > Sit: Supervision (verbal cues/safety issues);5: Sit  > Supine: Supervision (verbal cues/safety issues);4: Bed > Chair or W/C: Min A (steadying Pt. > 75%);4: Chair or W/C > Bed: Min A (steadying Pt. > 75%) FIM - TRadio producerDevices: BRecruitment consultantTransfers: 4-To toilet/BSC: Min A (steadying Pt. > 75%);4-From toilet/BSC: Min A (steadying Pt. > 75%) FIM - TSystems developerDevices: Shower cGeneticist, molecularWalk in shower;Grab bars Tub/shower Transfers: 4-Into Tub/Shower: Min A (steadying Pt. > 75%/lift 1 leg);4-Out of Tub/Shower: Min A (steadying Pt. > 75%/lift 1 leg)   Refer to Care Plan for Long Term Goals  Recommendations for other services: None  Discharge Criteria: Patient will be discharged from OT if patient refuses treatment 3 consecutive times without medical reason, if treatment goals not met, if there is a  change in medical status, if patient makes no progress towards goals or if patient is discharged from hospital.  The above assessment, treatment plan, treatment alternatives and goals were discussed and mutually agreed upon: by patient  Simonne Come 06/23/2015, 12:20 PM

## 2015-06-23 NOTE — Progress Notes (Signed)
Patient information reviewed and entered into eRehab system by Taylor Levick, RN, CRRN, PPS Coordinator.  Information including medical coding and functional independence measure will be reviewed and updated through discharge.     Per nursing patient was given "Data Collection Information Summary for Patients in Inpatient Rehabilitation Facilities with attached "Privacy Act Statement-Health Care Records" upon admission.  

## 2015-06-23 NOTE — Care Management Note (Signed)
[facility] Individual Statement of Services  Patient Name:  Sergio Griffin  Date:  06/23/2015  Welcome to the [facility].  Our goal is to provide you with an individualized program based on your diagnosis and situation, designed to meet your specific needs.  With this comprehensive rehabilitation program, you will be expected to participate in at least 3 hours of rehabilitation therapies Monday-Friday, with modified therapy programming on the weekends.  Your rehabilitation program will include the following services:  Physical Therapy (PT), Occupational Therapy (OT), 24 hour per day rehabilitation nursing, Therapeutic Recreaction (TR), Case Management (Social Worker), Rehabilitation Medicine, Nutrition Services and Pharmacy Services  Weekly team conferences will be held on Wednesday to discuss your progress.  Your Social Worker will talk with you frequently to get your input and to update you on team discussions.  Team conferences with you and your family in attendance may also be held.  Expected length of stay: 7 days  Overall anticipated outcome: mod/i level  Depending on your progress and recovery, your program may change. Your Social Worker will coordinate services and will keep you informed of any changes. Your Social Worker's name and contact numbers are listed  below.  The following services may also be recommended but are not provided by the [facility] will be made to provide these services after discharge if needed.  Arrangements include referral to agencies that provide these services.  Your insurance has been verified to be:  Medicare Your primary doctor is:  Looking for new PCP  Pertinent information will be shared with your doctor and your insurance company.  Social Worker:  Ovidio Kin, [place] or ([phone]  Information discussed with and copy given to patient by: Elease Hashimoto, 06/23/2015, 10:46 AM

## 2015-06-23 NOTE — IPOC Note (Signed)
Overall Plan of Care [facility]) Patient Details Name: MERVILLE HIJAZI MRN: [id number] DOB: 15-Jul-1951  Admitting Diagnosis: colitis  Hospital Problems: Principal Problem:   Physical debility Active Problems:   Acute on chronic kidney failure   CAD (coronary artery disease), native coronary artery   Essential hypertension   Ischemic colitis   Acute blood loss anemia     Functional Problem List: Nursing Edema, Endurance, Medication Management, Safety  PT Balance, Endurance, Motor, Safety  OT Balance, Endurance, Safety  SLP    TR         Basic ADL's: OT Grooming, Bathing, Dressing, Toileting     Advanced  ADL's: OT Simple Meal Preparation, Light Housekeeping     Transfers: PT Bed Mobility, Bed to Chair, Car, Manufacturing systems engineer, Metallurgist: PT Ambulation, Stairs     Additional Impairments: OT None  SLP        TR      Anticipated Outcomes Item Anticipated Outcome  Self Feeding no goal  Swallowing      Basic self-care  Mod I  Toileting  Mod I   Bathroom Transfers Mod I  Bowel/Bladder  Pt will manage bowel/bladder with min assist  Transfers  mod I   Locomotion  mod I   Communication     Cognition     Pain  <3  Safety/Judgment  will adhere to Safety precautions with min assist   Therapy Plan: PT Intensity: Minimum of 1-2 x/day ,45 to 90 minutes PT Frequency: 5 out of 7 days PT Duration Estimated Length of Stay: 7 days OT Intensity: Minimum of 1-2 x/day, 45 to 90 minutes OT Frequency: 5 out of 7 days OT Duration/Estimated Length of Stay: 7 days         Team Interventions: Nursing Interventions    PT interventions Ambulation/gait training, Training and development officer, Academic librarian, Discharge planning, Disease management/prevention, Engineer, drilling, Functional mobility training, Patient/family education, Psychosocial support, Stair training, Therapeutic Activities, Therapeutic Exercise, UE/LE Strength  taining/ROM, Wheelchair propulsion/positioning  OT Interventions Training and development officer, Discharge planning, DME/adaptive equipment instruction, Functional mobility training, Patient/family education, Psychosocial support, Self Care/advanced ADL retraining, Therapeutic Activities, Therapeutic Exercise, UE/LE Strength taining/ROM  SLP Interventions    TR Interventions    SW/CM Interventions Discharge Planning, Psychosocial Support, Patient/Family Education    Team Discharge Planning: Destination: PT-Home ,OT- Home , SLP-  Projected Follow-up: PT-Home health PT, Outpatient PT, OT-  None, SLP-  Projected Equipment Needs: PT-To be determined, OT- To be determined, SLP-  Equipment Details: PT- , OT-  Patient/family involved in discharge planning: PT- Patient,  OT-Patient, SLP-   MD ELOS: 7 days Medical Rehab Prognosis:  Excellent Assessment: The patient has been admitted for CIR therapies with the diagnosis of debility after multiple medical. The team will be addressing functional mobility, strength, stamina, balance, safety, adaptive techniques and equipment, self-care, bowel and bladder mgt, patient and caregiver education, activity tolerance, community reintegration, I ADL's, ego support. Goals have been set at mod I for mobility, self-care and ADL's.    Meredith Staggers, MD, FAAPMR      See Team Conference Notes for weekly updates to the plan of care

## 2015-06-23 NOTE — Progress Notes (Signed)
Social Work Assessment and Plan Social Work Assessment and Plan  Patient Details  Name: Sergio Griffin MRN: [id number] Date of Birth: 05/02/1951  Today's Date: 06/23/2015  Problem List:  Patient Active Problem List   Diagnosis Date Noted  . Physical debility 06/22/2015  . DOE (dyspnea on exertion)   . Acute on chronic renal failure   . Hematochezia   . Abnormal CT scan, colon   . Acute pulmonary edema   . Hypokalemia   . Acute blood loss anemia   . Ischemic colitis   . Essential hypertension   . Infectious colitis   . Acute cystitis without hematuria   . Pulmonary nodule   . Abdominal pain, acute   . Blood poisoning   . Hyperkalemia 06/11/2015  . CAD (coronary artery disease), native coronary artery 06/11/2015  . Wide-complex tachycardia 06/11/2015  . Abdominal pain, generalized   . CAD in native artery   . Elevated troponin   . UTI (lower urinary tract infection)   . Solitary pulmonary nodule   . Colitis 06/10/2015  . NSTEMI (non-ST elevated myocardial infarction) 06/10/2015  . Acute on chronic kidney failure 06/10/2015  . Lactic acidosis 06/10/2015   Past Medical History:  Past Medical History  Diagnosis Date  . Hypertension   . Reflux   . Glaucoma   . Hyperlipidemia   . CAD (coronary artery disease)     PCI, stent 2011  . Diverticulosis   . Gastric ulcer   . Prostatitis   . Pneumonia 11/2014  . Depression    Past Surgical History:  Past Surgical History  Procedure Laterality Date  . Cardiac stents    . Eye surgery     Social History:  reports that he has quit smoking. He does not have any smokeless tobacco history on file. He reports that he does not drink alcohol. His drug history is not on file.  Family / Support Systems Marital Status: Divorced How Long?: 2008 Patient Roles: Parent Children: Ian-65 yo son [phone] Other Supports: Jarrett Soho 65 yo daughter [phone] Anticipated Caregiver: Patient Ability/Limitations of Caregiver:  Pt only has intermittent assist via son and friends Caregiver Availability: Intermittent Family Dynamics: Pt is close with his two children but they are busy with their own lives. He has some friends but will not ask for assistance from them, due to his pride. He needs to get od/i before returning home.  Social History Preferred language: English Religion:  Cultural Background: No issues Education: Some college Read: Yes Write: Yes Employment Status: Retired Freight forwarder Issues: No issues Guardian/Conservator: None-according to MD pt is capable of making his own decisions while here   Abuse/Neglect Physical Abuse: Denies Verbal Abuse: Denies Sexual Abuse: Denies Exploitation of patient/patient's resources: Denies Self-Neglect: Denies  Emotional Status Pt's affect, behavior adn adjustment status: Pt is motivated and is feeling better now to participate in therapies and make progress to go home. He wants to be safe and able to take care of himself before he leaves. He has always been independent Recent Psychosocial Issues: Other health issues-was being followed by MD's but feels they were not following as much or as closely as they should have been Pyschiatric History: History of depression/anxiety takes medications for this and feel they are helpful and will continue these. He seems to be coping appropriately and doesn't need depression screen since already be addressed. Substance Abuse History: No issues  Patient / Family Perceptions, Expectations & Goals Pt/Family understanding of illness & functional limitations: Pt is  able to expalin his illness and treatment he has received. 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 He feels his concerns and questions are being addressed and he feels he is getting good care here. Premorbid pt/family roles/activities: Father, retiree, friend, church member, etc Anticipated changes in  roles/activities/participation: resume Pt/family expectations/goals: Pt states: " I want to be able to take care of myself before I leave here, I don';t have anybody at home."  [employer]: None Premorbid Home Care/DME Agencies: None Transportation available at discharge: Self Resource referrals recommended: Support group (specify)  Discharge Planning Living Arrangements: Alone Support Systems: Children, Friends/neighbors Type of Residence: Private residence Insurance Resources: Chartered certified accountant Resources: [place] Referred: No Living Expenses: Own Money Management: Patient Does the patient have any problems obtaining your medications?: No Home Management: Patient Patient/Family Preliminary Plans: Return home with intermittent assist from friends and son until son starts college in August. He needs to be mod/i-independent level before going home. He is willing to work hard and push himself to achieve these goals. Social Work Anticipated Follow Up Needs: HH/OP, Support Group  Clinical Impression Pleasant gentleman how is feeling much better and willing to work and achieve his independence again. He has limited support at home and needs to be high level before discharge. He should do well here and be a short length of stay. Will assist with finding a new PCP and work on discharge needs.  Elease Hashimoto 06/23/2015, 10:44 AM

## 2015-06-24 ENCOUNTER — Inpatient Hospital Stay (HOSPITAL_COMMUNITY): Payer: Medicare Other | Admitting: Occupational Therapy

## 2015-06-24 ENCOUNTER — Inpatient Hospital Stay (HOSPITAL_COMMUNITY): Payer: Medicare Other

## 2015-06-24 NOTE — Progress Notes (Signed)
Occupational Therapy Session Note  Patient Details  Name: Sergio Griffin MRN: [id number] Date of Birth: 07-09-1951  Today's Date: 06/24/2015 OT Individual Time: 1430-1500  (23  Min)  1st session OT Individual Time Calculation (min): 30 min    Short Term Goals: Week 1:  OT Short Term Goal 1 (Week 1): STG = LTGs due to ELOS Week 2:     Skilled Therapeutic Interventions/Progress Updates:    1st session:  Ambulated with RW to bathroom at end o hall.  Practiced stepping over into tub x2 with rail one time and holding to wall 2nd time with min steadying assist.  Ambulated back to room and left with all needs in reach.     2nd session:   1600-1700   (60 min)    Ambulated with RW to ADL apt.  Engaged in simple one item meal prep using cooktop.  Educated pt on RW and using safely while in kitchen.  Pt demonstrated safatey and mod I with cooking activity.  Returned to room and left with all needs in place.  Therapy Documentation Precautions:  Precautions Precautions: Fall Precaution Comments: pt reports fall 3 mos PTA due to poor vision and tripped Restrictions Weight Bearing Restrictions: No       Pain :none for both sessions          See FIM for current functional status  Therapy/Group: Individual Therapy  Lisa Roca 06/24/2015, 7:02 PM

## 2015-06-24 NOTE — Progress Notes (Signed)
Occupational Therapy Session Note  Patient Details  Name: Sergio Griffin MRN: [id number] Date of Birth: 1951/08/12  Today's Date: 06/24/2015 OT Individual Time: 0730-0830 60 min  Short Term Goals: Week 1:  OT Short Term Goal 1 (Week 1): STG = LTGs due to ELOS  Skilled Therapeutic Interventions/Progress Updates: ADL-retraining at shower level with focus on use of the following AE (LH sponge, reacher, wide sock aid, long shoe horn, and elastic laces).   Pt able to rise from bed to EOB unassisted using bed rail and HOB elevated.   Pt ambulates to bath bench with supervision and bathes seated unassisted using LH sponge provided to reach his back and feet with min instructional cues.   Pt rests on BSC and dons shirt with only setup assist and proceeds to lower body, using reacher to don underwear and pants, sock aid to don socks, and shoe horn to don shoes with elastic laces applied, again with min instructional cues and extra time.  Pt completes tasks in 53 minutes and ambulates to recliner to self-feed after setup to reheat food tray.     Therapy Documentation Precautions:  Precautions Precautions: Fall Precaution Comments: pt reports fall 3 mos PTA due to poor vision and tripped Restrictions Weight Bearing Restrictions: No   Vital Signs: Therapy Vitals Temp: 98 F (36.7 C) Temp Source: Oral Pulse Rate: 80 Resp: (!) 22 BP: 124/68 mmHg Patient Position (if appropriate): Lying Oxygen Therapy SpO2: 95 % O2 Device: Not Delivered   Pain: No/denies pain     See FIM for current functional status  Therapy/Group: Individual Therapy  Shalom Ware 06/24/2015, 7:09 AM

## 2015-06-24 NOTE — Progress Notes (Signed)
[place] PHYSICAL MEDICINE & REHABILITATION     PROGRESS NOTE    Subjective/Complaints: Had a very good night. Appetite good. No sob, cough, belly pain  ROS: Pt denies fever, rash/itching, headache, blurred or double vision, nausea, vomiting,   diarrhea, chest pain,  , palpitations, dysuria, dizziness, neck or back pain, bleeding, anxiety, or depression   Objective: Vital Signs: Blood pressure 124/68, pulse 80, temperature 98 F (36.7 C), temperature source Oral, resp. rate 22, height 5\' 11"  (1.803 m), weight 134.219 kg (295 lb 14.4 oz), SpO2 95 %. No results found.  Recent Labs  06/22/15 0301 06/23/15 0520  WBC 10.4 11.1*  HGB 10.8* 10.6*  HCT 34.7* 34.0*  PLT 378 417*    Recent Labs  06/22/15 1130 06/23/15 0520  NA 137 136  K 4.4 4.4  CL 98* 101  GLUCOSE 125* 114*  BUN 20 20  CREATININE 1.34* 1.33*  CALCIUM 9.0 8.5*   CBG (last 3)  No results for input(s): GLUCAP in the last 72 hours.  Wt Readings from Last 3 Encounters:  06/24/15 134.219 kg (295 lb 14.4 oz)  06/22/15 128 kg (282 lb 3 oz)    Physical Exam:  Constitutional: He is oriented to person, place, and time. He appears well-developed and well-nourished. No distress.  obese  HENT:  Head: Normocephalic and atraumatic.  Eyes: Conjunctivae are normal. Pupils are equal, round, and reactive to light.  Neck: Normal range of motion. Neck supple. No JVD present. No tracheal deviation present. No thyromegaly present.  Cardiovascular: Normal rate, regular rhythm, normal heart sounds and intact distal pulses. Exam reveals no friction rub.  No murmur heard. Respiratory: Effort normal and breath sounds normal. No respiratory distress. He has no wheezes. He has no rales.  GI: Soft. He exhibits no distension. There is no tenderness.  Bowel sounds remain sluggish  Musculoskeletal: He exhibits edema (trace edema).  Lymphadenopathy:   He has no cervical adenopathy.  Neurological: He is alert and oriented to  person, place, and time. No cranial nerve deficit. He exhibits normal muscle tone. Coordination normal.  Speech clear. Follows commands without difficulty. BUE with intentional tremors. UE strength grossly 4+ to 5/5 deltoids, biceps, triceps, HI. LE: 4/5 hf, ke and 5/5 adf/apf. No sensory findings. Cognitively appropriate  Skin: Skin is warm and dry. He is not diaphoretic.  Psychiatric: He has a normal mood and affect. His behavior is normal. Judgment and thought content normal.     Assessment/Plan: 1. Functional deficits secondary to debility related to ischemic colitis which require 3+ hours per day of interdisciplinary therapy in a comprehensive inpatient rehab setting. Physiatrist is providing close team supervision and 24 hour management of active medical problems listed below. Physiatrist and rehab team continue to assess barriers to discharge/monitor patient progress toward functional and medical goals. FIM: FIM - Bathing Bathing Steps Patient Completed: Chest, Right Arm, Left Arm, Abdomen, Front perineal area, Buttocks, Right upper leg, Left upper leg Bathing: 4: Min-Patient completes 8-9 15f 10 parts or 75+ percent  FIM - Upper Body Dressing/Undressing Upper body dressing/undressing steps patient completed: Thread/unthread right sleeve of pullover shirt/dresss, Thread/unthread left sleeve of pullover shirt/dress, Put head through opening of pull over shirt/dress, Pull shirt over trunk Upper body dressing/undressing: 5: Set-up assist to: Obtain clothing/put away FIM - Lower Body Dressing/Undressing Lower body dressing/undressing steps patient completed: Thread/unthread left underwear leg, Pull underwear up/down, Thread/unthread right pants leg, Thread/unthread right underwear leg, Thread/unthread left pants leg, Pull pants up/down, Don/Doff right shoe, Don/Doff  left shoe Lower body dressing/undressing: 3: Mod-Patient completed 50-74% of tasks     FIM - Sport and exercise psychologist Devices: Bedside commode Toilet Transfers: 4-To toilet/BSC: Min A (steadying Pt. > 75%), 4-From toilet/BSC: Min A (steadying Pt. > 75%)  FIM - Bed/Chair Transfer Bed/Chair Transfer Assistive Devices: Copy: 5: Supine > Sit: Supervision (verbal cues/safety issues), 5: Sit > Supine: Supervision (verbal cues/safety issues), 4: Bed > Chair or W/C: Min A (steadying Pt. > 75%), 4: Chair or W/C > Bed: Min A (steadying Pt. > 75%)  FIM - Locomotion: Wheelchair Distance: 150 Locomotion: Wheelchair: 5: Travels 150 ft or more: maneuvers on rugs and over door sills with supervision, cueing or coaxing FIM - Locomotion: Ambulation Locomotion: Ambulation Assistive Devices: Administrator Ambulation/Gait Assistance: 4: Min guard Locomotion: Ambulation: 1: Travels less than 50 ft with minimal assistance (Pt.>75%)  Comprehension Comprehension Mode: Auditory Comprehension: 5-Understands complex 90% of the time/Cues < 10% of the time  Expression Expression Mode: Verbal Expression: 5-Expresses basic 90% of the time/requires cueing < 10% of the time.  Social Interaction Social Interaction: 7-Interacts appropriately with others - No medications needed.  Problem Solving Problem Solving: 5-Solves basic 90% of the time/requires cueing < 10% of the time  Memory Memory: 7-Complete Independence: No helper  Medical Problem List and Plan: 1. Functional deficits secondary to Debility related to ischemic colitis and multiple medical issues.   -improving stamina  -needs to be mod I at home 2. DVT Prophylaxis/Anticoagulation: Pharmaceutical: Lovenox  indicated until mobility increases 3. Pain Management: Tylenol prn for pain control. Denies abdominal pain currently.  4. Mood: LCSW to follow for evaluation and support. Appears motivated and up beat. 5. Neuropsych: This patient is capable of making decisions on his own behalf. 6. Skin/Wound Care:   Routine pressure  relief measures.  7. Fluids/Electrolytes/Nutrition: Monitor I/O. Checking daily weights. Low salt diet to continue 8. Ischemic colitis: Rectal bleeding resolved. To continue Cipro and Flagyl --thru Sunday 8/7  -moved bowels thursday 9. NSTEMI: Echo with normal function. Treated medically with Cardiology following at a distance.  10. Solitary lung nodule: Follow up CT chest in 6-12 months.  11. Acute on chronic renal failure: Lactic acidosis resolved. Creatinine trending down. Avoid nephrotoxic drugs.   --Cr still improving 12. ABLA: continue iron supplement. Recheck next week 13. Depression with anxiety disorder: Continue Wellbutrin daily and Klonopin bid. 14. Fluid overload: Resolved. Off lasix, minimal edema on exam   LOS (Days) 2 A FACE TO FACE EVALUATION WAS PERFORMED  Aleyza Salmi T 06/24/2015 8:28 AM

## 2015-06-24 NOTE — Progress Notes (Addendum)
Physical Therapy Session Note  Patient Details  Name: Sergio Griffin MRN: [id number] Date of Birth: March 12, 1951  Today's Date: 06/24/2015 PT Individual Time: 1100-1200 PT Individual Time Calculation (min): 60 min   Short Term Goals: Week 1:  PT Short Term Goal 1 (Week 1): =LTG's due to ELOS  Skilled Therapeutic Interventions/Progress Updates:   Session focused on overall endurance and activity tolerance with functional mobility (rest breaks as needed), transfers from various surfaces at overall S level using RW for support, HEP given to address strength, balance, and endurance, stair negotiation for home and community mobility for curb step with RW with cues for technique and foot placement and steady A to close S, gait training with RW with focus on safety (2 mild LOB with steady A to recover and cues for widening BOS to decrease fall risk) while performing dual task activities and d/c planning in regards to safe home set-up. Introduced HEP for [employer] exercises to address strength and balance. Pt performed 10 reps for BLE including LAQ with 5 second hold, standing hip abduction, standing hamstring curls, mini squats, and standing heel/toe raises. Also added 2 core/trunk strengthening exercises for PNF diagonals and trunk rotation with 1lb weighted medicine ball to HEP with education on importance of core strengthening (and tightening core during gait) to decrease stress on low back due to pt c/o recent low back pain due to posture. End of session returned to room with all needs in reach. Pt with excellent participation. Continue PT POC.   Therapy Documentation Precautions:  Precautions Precautions: Fall Precaution Comments: pt reports fall 3 mos PTA due to poor vision and tripped Restrictions Weight Bearing Restrictions: No   Pain: Denies pain.  See FIM for current functional status  Therapy/Group: Individual Therapy  Canary Brim Ivory Broad, PT, DPT  06/24/2015, 12:09 PM

## 2015-06-25 ENCOUNTER — Inpatient Hospital Stay (HOSPITAL_COMMUNITY): Payer: Medicare Other | Admitting: Occupational Therapy

## 2015-06-25 ENCOUNTER — Inpatient Hospital Stay (HOSPITAL_COMMUNITY): Payer: Medicare Other | Admitting: *Deleted

## 2015-06-25 ENCOUNTER — Inpatient Hospital Stay (HOSPITAL_COMMUNITY): Payer: Medicare Other

## 2015-06-25 ENCOUNTER — Inpatient Hospital Stay (HOSPITAL_COMMUNITY): Payer: Medicare Other | Admitting: Physical Therapy

## 2015-06-25 NOTE — Progress Notes (Signed)
Occupational Therapy Session Note  Patient Details  Name: Sergio Griffin MRN: [id number] Date of Birth: 09/14/1951  Today's Date: 06/25/2015 OT Individual Time: 0800-0900 OT Individual Time Calculation (min): 60 min    Short Term Goals: Week 1:  OT Short Term Goal 1 (Week 1): STG = LTGs due to ELOS  Skilled Therapeutic Interventions/Progress Updates:    Pt sitting EOB finishing breakfast upon arrival.  Pt engaged in BADL retraining including bathing at shower level and dressing with sit<>stand from EOB.  Pt uses AE (LH sponge, reacher, sock aide, and shoe horn) appropriately to assist with all tasks.  Pt amb with RW to bathroom to use toilet before transferring to shower for bathing.  Pt requires more than a reasonable amount of time to complete all tasks with multiple rest breaks.  Pt exhibits SOB periodically throughout activities but takes rest breaks appropriately.  Focus on activity tolerance, sit<>stand, standing balance, functional amb with RW, continued discharge planning and education, and safety awareness.  Therapy Documentation Precautions:  Precautions Precautions: Fall Precaution Comments: pt reports fall 3 mos PTA due to poor vision and tripped Restrictions Weight Bearing Restrictions: No Pain: Pain Assessment Pain Assessment: No/denies pain  See FIM for current functional status  Therapy/Group: Individual Therapy  Leroy Libman 06/25/2015, 9:11 AM

## 2015-06-25 NOTE — Progress Notes (Signed)
Occupational Therapy Session Note  Patient Details  Name: Sergio Griffin MRN: [id number] Date of Birth: [birth year]  Today's Date: 06/25/2015 OT Individual Time:  -   1605-1700  (5 min)      Short Term Goals: Week 1:  OT Short Term Goal 1 (Week 1): STG = LTGs due to ELOS      Skilled Therapeutic Interventions/Progress Updates:    Therapy Documentation;         Addressed functional mobility, UE AROM and strengthening.  Pt ambulated with RW and SBA from room to gym.  Used Sci fit for  2 rounds of 8 min. And 9 1/2 min.  Pt reported he could tell his muscles were working.  Provided 5 minute rest in between each bout.    Pt. Ambulated back to room and to the toilet.  Left pt on toilet to call when he was finished.     Precautions:  Precautions Precautions: Fall Precaution Comments: pt reports fall 3 mos PTA due to poor vision and tripped Restrictions Weight Bearing Restrictions: No    Pain:  none     See FIM for current functional status  Therapy/Group: Individual Therapy  Lisa Roca 06/25/2015, 8:21 AM

## 2015-06-25 NOTE — Progress Notes (Addendum)
Physical Therapy Session Note  Patient Details  Name: Sergio Griffin MRN: [id number] Date of Birth: 07-04-1951  Today's Date: 06/25/2015 PT Individual Time: 1000-1100 PT Individual Time Calculation (min): 60 min   Short Term Goals: Week 1:  PT Short Term Goal 1 (Week 1): =LTG's due to ELOS  Skilled Therapeutic Interventions/Progress Updates:   Pt demonstrates improvement initiating gait without AD. Pt benefits from RW for increased activity tolerance compared to no AD at this time. Pt would continue to benefit from skilled PT services to increase functional mobility.  Therapy Documentation Precautions:  Precautions Precautions: Fall Precaution Comments: pt reports fall 3 mos PTA due to poor vision and tripped Restrictions Weight Bearing Restrictions: No Vital Signs: Oxygen Therapy SpO2: 98 % O2 Device: Not Delivered Pain: Pain Assessment Pain Assessment: No/denies pain Mobility:  SBA for transfers Locomotion : Ambulation Ambulation/Gait Assistance: 4: Min guard;5: Supervision  Other Treatments:  Pt educated on rehab plan, safety in mobility, talking with doctor regarding sleep study, positioning during sleep, and progressing activities.  UT, pec release. T/S mobs. Transfers x15. Free gait 15'x3 CGA. Pt performs static standing 1'x5. Pt performs chin tucks, heel raises 2x10. Standing on double foam 30"x3 with EO and EC each.  See FIM for current functional status  Therapy/Group: Individual Therapy  Monia Pouch 06/25/2015, 10:17 AM

## 2015-06-25 NOTE — Progress Notes (Signed)
Physical Therapy Session Note  Patient Details  Name: Sergio Griffin MRN: [id number] Date of Birth: 1951-07-15  Today's Date: 06/25/2015 PT Individual Time: 7711-6579 PT Individual Time Calculation (min): 30 min     Skilled Therapeutic Interventions/Progress Updates:  Patient in room , sitting in recliner no c/o pain or discomfort agrees to therapy interventions. Gait training to and from room to gym with [place] with Supervision. NuStep 1 x 10 min with resistance set at 4 in order to increase activity tolerance and facilitate coordination with reciprocal movements during gait. BORG scale utilized and patient reports 13-somewhat hard at the end of exercise.  Biodex 1 x on steady surface -maze program with min to CGA A and cues for weight shifting, 1 x level 12 with mod A to complete due to poor ability to weight shift forward and to the R.  Patient returned to room , left in recliner with all needs within reach.   Therapy Documentation Precautions:  Precautions Precautions: Fall Precaution Comments: pt reports fall 3 mos PTA due to poor vision and tripped Restrictions Weight Bearing Restrictions: No Vital Signs: Therapy Vitals Pulse Rate: 76 Resp: 18 BP:131/63 Patient Position (if appropriate): Sitting Oxygen Therapy SpO2: 99 % O2 Device: Not Delivered  See FIM for current functional status  Therapy/Group: Individual Therapy  [name] 06/25/2015, 3:51 PM

## 2015-06-25 NOTE — Progress Notes (Signed)
  [place] PHYSICAL MEDICINE & REHABILITATION     PROGRESS NOTE    Subjective/Complaints: He has no complaints this morning. He may be a little sore from therapyEating well.   Objective: Vital Signs: Blood pressure 111/65, pulse 67, temperature 97.9 F (36.6 C), temperature source Oral, resp. rate 18, height 5\' 11"  (1.803 m), weight 299 lb (135.626 kg), SpO2 94 %.  No acute distress. He is sitting up.HEENT: Atraumatic, normocephalic Chest is clear to auscultation Cardiac exam S1 and S2 are regular Abdominal exam overweight, active bowel sounds Extremities with 1+ edema to the mid tibia.l.     Assessment/Plan: 1. Functional deficits secondary to debility related to ischemic colitis   Medical Problem List and Plan: 1. Functional deficits secondary to Debility related to ischemic colitis and multiple medical issues.   -improving stamina  -needs to be mod I at home 2. DVT Prophylaxis/Anticoagulation: Pharmaceutical: Lovenox  indicated until mobility increases 3. Pain Management: Tylenol prn for pain control. Denies pain currently.  4. Mood: LCSW to follow for evaluation and support. Appears motivated and up beat. 5. Neuropsych: This patient is capable of making decisions on his own behalf. 6. Skin/Wound Care:   Routine pressure relief measures.  7. Fluids/Electrolytes/Nutrition: Monitor I/O. Checking daily weights. Low salt diet to continue 8. Ischemic colitis: Rectal bleeding resolved. To continue Cipro and Flagyl --thru Sunday 8/7  -moved bowels thursday 9. NSTEMI: Echo with normal function. Treated medically with Cardiology following at a distance.  10. Solitary lung nodule: Follow up CT chest in 6-12 months.  11. Acute on chronic renal failure:  Lab Results  Component Value Date   CREATININE 1.33* 06/23/2015    12. ABLA: continue iron supplement. Recheck next week Lab Results  Component Value Date   HGB 10.6* 06/23/2015    13. Depression with anxiety  disorder: controlled 14. Fluid overload: Resolved. Off lasix, minimal edema on exam   LOS (Days) 3 A FACE TO [place] 06/25/2015 8:54 AM

## 2015-06-26 ENCOUNTER — Inpatient Hospital Stay (HOSPITAL_COMMUNITY): Payer: Medicare Other | Admitting: Physical Therapy

## 2015-06-26 ENCOUNTER — Inpatient Hospital Stay (HOSPITAL_COMMUNITY): Payer: Medicare Other | Admitting: Occupational Therapy

## 2015-06-26 NOTE — Progress Notes (Signed)
Physical Therapy Session Note  Patient Details  Name: Sergio Griffin MRN: [id number] Date of Birth: 1951-07-19  Today's Date: 06/26/2015 PT Individual Time: 1000-1100, 1355-1440 PT Individual Time Calculation (min): 60 min , 45 min  Short Term Goals: Week 1:  PT Short Term Goal 1 (Week 1): =LTG's due to ELOS  Skilled Therapeutic Interventions/Progress Updates:    AM session limited for standing activities by lightheadedness and orthostasis. Pt also with limited ability to tolerate supine due to increased work of gravity with abdomen adipose and likely neck adipose tissue. Improvement in pt effort noted in sidelying and supported supine lying and facilitation of lateral chest breathing. Pt would continue to benefit from skilled PT services to increase functional mobility.  Therapy Documentation Precautions:  Precautions Precautions: Fall Precaution Comments: pt reports fall 3 mos PTA due to poor vision and tripped Restrictions Weight Bearing Restrictions: No Vital Signs: BP 138/53 Pulse 83 in sitting, BP 105/57 in standing in am session Pain: Pain Assessment Pain Assessment: No/denies pain Mobility:  SBA for transfers Locomotion : Ambulation Ambulation/Gait Assistance: 4: Min guard;5: Supervision  Other Treatments:  Tx 1: Pt educated on rehab plan, safety in mobility, anticipated disposition, positioning, and orthostasis. Quadratus lumborum, abd viscera, rib myofascial releases B/L followed by lateral chest breathing facilitation followed by active practice. Pt educated on sleep positioning and educated on neutral alignment. Static standing 2'x2. Standing marching and hip ext 2x10.  Tx 2: Pt educated on rehab plan, safety in mobility, and progressing mobility. Pt performs transfers 3x5. Anterior weight shifts 2x10, partial sit to stands 2x5. Pt performs standing balance 30"x4. Heel raises, alternating step-ups 2x4. Self ball toss, trunk rotations, PNF D2 UE B/L, marching 2x10. Free  gait 25'x4 with CGA and cues for gait velocity.  See FIM for current functional status  Therapy/Group: Individual Therapy  Monia Pouch 06/26/2015, 10:48 AM

## 2015-06-26 NOTE — Progress Notes (Signed)
  [place] PHYSICAL MEDICINE & REHABILITATION     PROGRESS NOTE    Subjective/Complaints:  Pt has no complaints. He may be a little sore from therapy. Eating well.   Objective: Vital Signs: Blood pressure 124/76, pulse 82, temperature 98 F (36.7 C), temperature source Oral, resp. rate 18, height 5\' 11"  (1.803 m), weight 298 lb 6.4 oz (135.353 kg), SpO2 95 %.  No acute distress. He is sitting up. HEENT: Atraumatic, normocephalic, moist Chest is clear to auscultation Cardiac exam S1 and S2 are regular Abdominal exam overweight, active bowel sounds Extremities with 1+ edema to the mid tibia. Skin w/aging changes      Medical Problem List and Plan:  1. Functional deficits secondary to Debility related to ischemic colitis and multiple medical issues.   -improving stamina  -needs to be mod I at home 2. DVT Prophylaxis/Anticoagulation: Pharmaceutical: Lovenox  indicated until mobility increases 3. Pain Management: Tylenol prn for pain control. Denies pain currently.  4. Mood: LCSW to follow for evaluation and support. Appears motivated and up beat. 5. Neuropsych: This patient is capable of making decisions on his own behalf. 6. Skin/Wound Care:   Routine pressure relief measures.  7. Fluids/Electrolytes/Nutrition: Monitor I/O. Checking daily weights. Low salt diet to continue 8. Ischemic colitis: Rectal bleeding resolved. To continue Cipro and Flagyl --thru Sunday 8/7  -moved bowels thursday 9. NSTEMI: Echo with normal function. Treated medically with Cardiology following at a distance.  10. Solitary lung nodule: Follow up CT chest in 6-12 months.  11. Acute on chronic renal failure:  Lab Results  Component Value Date   CREATININE 1.33* 06/23/2015    12. ABLA: continue iron supplement. Recheck next week Lab Results  Component Value Date   HGB 10.6* 06/23/2015    13. Depression with anxiety disorder: controlled 14. Fluid overload: Resolved. Off lasix,  minimal edema on exam   LOS (Days) 4 A FACE TO FACE EVALUATION WAS PERFORMED  Walker Kehr 06/26/2015 4:56 PM

## 2015-06-26 NOTE — Progress Notes (Signed)
Occupational Therapy Session Note  Patient Details  Name: Sergio Griffin MRN: [id number] Date of Birth: [birth year]  Today's Date: 06/26/2015 OT Individual Time: 2440-1027 and 2536-6440 OT Individual Time Calculation (min): 60 min and 45 min   Short Term Goals: Week 1:  OT Short Term Goal 1 (Week 1): STG = LTGs due to ELOS  Skilled Therapeutic Interventions/Progress Updates:    1) Engaged in ADL retraining with focus on activity tolerance, sit <> stand, standing balance, functional mobility with RW, and use of AE to increase independence with self-care tasks.  Pt finishing breakfast upon arrival.  Ambulated to toilet with RW and supervision, pt completed all toileting tasks with increased time.  Educated on side stepping within RW to increase safety when negotiating narrow spaces, pt retrieved clothing from dresser with good safety awareness while reaching outside BOS.  Bathing completed at sit > stand level in room shower with increased time due to requiring multiple rest breaks.  Educated on pursed lip breathing to increase activity tolerance and decrease frequency of SOB.  Pt demonstrated appropriate use of AE (long handled sponge, reacher, shoe horn, and sock aid) to increase independence with LB dressing.  Pt reports pleased with progress and ability to utilize AE to increase independence.   2) Engaged in therapeutic activity with focus on functional mobility in home environment and bathroom transfers.  Pt ambulated to ADL apt with RW and supervision.  Pt completed tub/shower transfers with stepping over ledge with supervision.  Pt utilized grab bar and without, recommend grab bar for increased safety with tub/shower transfers with pt agreeable to recommendation.  Discussed toilet setup with no modifications recommended at this time.  Noted pt to have increased swelling in RLE, educated on elevating BLE during rest times and ankle pumps to decrease swelling with pt again receptive to  recommendations.  Pt returned to room with RW with improved activity tolerance and spontaneous recall of pursed lip breathing throughout activity.  Therapy Documentation Precautions:  Precautions Precautions: Fall Precaution Comments: pt reports fall 3 mos PTA due to poor vision and tripped Restrictions Weight Bearing Restrictions: No General:   Vital Signs: Therapy Vitals Temp: 97.8 F (36.6 C) Temp Source: Oral Pulse Rate: 70 Resp: 18 BP: 127/61 mmHg Patient Position (if appropriate): Lying Oxygen Therapy SpO2: 97 % O2 Device: Not Delivered Pain:  Pt with no c/o pain  See FIM for current functional status  Therapy/Group: Individual Therapy  Simonne Come 06/26/2015, 8:47 AM

## 2015-06-27 ENCOUNTER — Inpatient Hospital Stay (HOSPITAL_COMMUNITY): Payer: Medicare Other | Admitting: Physical Therapy

## 2015-06-27 ENCOUNTER — Inpatient Hospital Stay (HOSPITAL_COMMUNITY): Payer: Medicare Other | Admitting: Occupational Therapy

## 2015-06-27 ENCOUNTER — Inpatient Hospital Stay (HOSPITAL_COMMUNITY): Payer: Medicare Other

## 2015-06-27 DIAGNOSIS — I8311 Varicose veins of right lower extremity with inflammation: Secondary | ICD-10-CM

## 2015-06-27 DIAGNOSIS — I2510 Atherosclerotic heart disease of native coronary artery without angina pectoris: Secondary | ICD-10-CM

## 2015-06-27 DIAGNOSIS — I8312 Varicose veins of left lower extremity with inflammation: Secondary | ICD-10-CM

## 2015-06-27 LAB — BASIC METABOLIC PANEL
Anion gap: 7 (ref 5–15)
BUN: 12 mg/dL (ref 6–20)
CHLORIDE: 105 mmol/L (ref 101–111)
CO2: 24 mmol/L (ref 22–32)
Calcium: 8.7 mg/dL — ABNORMAL LOW (ref 8.9–10.3)
Creatinine, Ser: 1.2 mg/dL (ref 0.61–1.24)
GFR calc non Af Amer: >60 mL/min (ref >60)
Glucose, Bld: 90 mg/dL (ref 65–99)
Potassium: 4.9 mmol/L (ref 3.5–5.1)
Sodium: 136 mmol/L (ref 135–145)

## 2015-06-27 MED ORDER — FUROSEMIDE 20 MG PO TABS
10.0000 mg | ORAL_TABLET | Freq: Every day | ORAL | Status: DC
  Administered 2015-06-27 – 2015-06-30 (×4): 10 mg via ORAL
  Filled 2015-06-27 (×4): qty 1

## 2015-06-27 NOTE — Progress Notes (Signed)
PHYSICAL MEDICINE & REHABILITATION     PROGRESS NOTE    Subjective/Complaints: CC Foot swelling  Had foot swelling at home, discussed compression hose with PCP but never got them, only started Lasix in hospital  ROS: Pt denies f, headache, blurred or double vision, nausea, vomiting,   diarrhea, chest pain,  ,+ dry eyes uses moisturizing drops  Objective: Vital Signs: Blood pressure 123/58, pulse 67, temperature 97.6 F (36.4 C), temperature source Oral, resp. rate 18, height 5\' 11"  (1.803 m), weight 133.6 kg (294 lb 8.6 oz), SpO2 97 %. No results found. No results for input(s): WBC, HGB, HCT, PLT in the last 72 hours. No results for input(s): NA, K, CL, GLUCOSE, BUN, CREATININE, CALCIUM in the last 72 hours.  Invalid input(s): CO CBG (last 3)  No results for input(s): GLUCAP in the last 72 hours.  Wt Readings from Last 3 Encounters:  06/27/15 133.6 kg (294 lb 8.6 oz)  06/22/15 128 kg (282 lb 3 oz)    Physical Exam:  Constitutional: He is oriented to person, place, and time. He appears well-developed and well-nourished. No distress.  obese  HENT:  Head: Normocephalic and atraumatic.  Eyes: Conjunctivae are normal. Pupils are equal, round, and reactive to light.  Neck: Normal range of motion. Neck supple. No JVD present. No tracheal deviation present. No thyromegaly present.  Cardiovascular: Normal rate, regular rhythm, normal heart sounds and intact distal pulses. Exam reveals no friction rub.  No murmur heard. Respiratory: Effort normal and breath sounds normal. No respiratory distress. He has no wheezes. He has no rales.  GI: Soft. He exhibits no distension. There is no tenderness.  Bowel sounds reduced  Musculoskeletal: He exhibits edema 1+ BLE Lymphadenopathy:   He has no cervical adenopathy.  Neurological: He is alert and oriented to person, place, and time. No cranial nerve deficit. He exhibits normal muscle tone. Coordination normal.  Speech clear.  Follows commands without difficulty.UE strength grossly 4+ to 5/5 deltoids, biceps, triceps, HI. LE: 4/5 hf, ke and 5/5 adf/apf. No sensory findings. Cognitively appropriate  Skin: Skin is warm and dry. Stasis dermatitis in BLE no open areas.  Psychiatric: He has a normal mood and affect. His behavior is normal. Judgment and thought content normal.     Assessment/Plan: 1. Functional deficits secondary to debility related to ischemic colitis which require 3+ hours per day of interdisciplinary therapy in a comprehensive inpatient rehab setting. Physiatrist is providing close team supervision and 24 hour management of active medical problems listed below. Physiatrist and rehab team continue to assess barriers to discharge/monitor patient progress toward functional and medical goals. FIM: FIM - Bathing Bathing Steps Patient Completed: Chest, Right Arm, Left Arm, Abdomen, Front perineal area, Buttocks, Right upper leg, Left upper leg, Right lower leg (including foot), Left lower leg (including foot) Bathing: 6: Assistive device (Comment)  FIM - Upper Body Dressing/Undressing Upper body dressing/undressing steps patient completed: Thread/unthread right sleeve of pullover shirt/dresss, Thread/unthread left sleeve of pullover shirt/dress, Put head through opening of pull over shirt/dress, Pull shirt over trunk Upper body dressing/undressing: 6: More than reasonable amount of time FIM - Lower Body Dressing/Undressing Lower body dressing/undressing steps patient completed: Thread/unthread left underwear leg, Pull underwear up/down, Thread/unthread right pants leg, Thread/unthread right underwear leg, Thread/unthread left pants leg, Pull pants up/down, Don/Doff right shoe, Don/Doff left shoe, Don/Doff right sock, Don/Doff left sock Lower body dressing/undressing: 5: Supervision: Safety issues/verbal cues  FIM - Toileting Toileting steps completed by patient: Adjust clothing prior  to toileting,  Performs perineal hygiene, Adjust clothing after toileting Toileting Assistive Devices: Grab bar or rail for support Toileting: 6: More than reasonable amount of time  FIM - Radio producer Devices: Environmental consultant, Product manager Transfers: 5-To toilet/BSC: Supervision (verbal cues/safety issues), 5-From toilet/BSC: Supervision (verbal cues/safety issues)  FIM - Control and instrumentation engineer Devices: Walker, Arm rests Bed/Chair Transfer: 5: Chair or W/C > Bed: Supervision (verbal cues/safety issues), 5: Bed > Chair or W/C: Supervision (verbal cues/safety issues)  FIM - Locomotion: Wheelchair Distance: 150 Locomotion: Wheelchair: 0: Activity did not occur (gait to and from therapy) FIM - Locomotion: Ambulation Locomotion: Ambulation Assistive Devices: Administrator Ambulation/Gait Assistance: 4: Min guard, 5: Supervision Locomotion: Ambulation: 4: Travels 150 ft or more with minimal assistance (Pt.>75%)  Comprehension Comprehension Mode: Auditory Comprehension: 5-Understands complex 90% of the time/Cues < 10% of the time  Expression Expression Mode: Verbal Expression: 5-Expresses basic 90% of the time/requires cueing < 10% of the time.  Social Interaction Social Interaction: 7-Interacts appropriately with others - No medications needed.  Problem Solving Problem Solving: 5-Solves complex 90% of the time/cues < 10% of the time  Memory Memory: 7-Complete Independence: No helper  Medical Problem List and Plan: 1. Functional deficits secondary to Debility related to ischemic colitis and multiple medical issues.   -improving stamina  -needs to be mod I at home 2. DVT Prophylaxis/Anticoagulation: Pharmaceutical: Lovenox  indicated until mobility increases 3. Pain Management: Tylenol prn for pain control. Denies abdominal pain currently.  4. Mood: LCSW to follow for evaluation and support. Appears motivated and up beat. 5. Neuropsych:  This patient is capable of making decisions on his own behalf. 6. Skin/Wound Care:   Routine pressure relief measures.  7. Fluids/Electrolytes/Nutrition: Monitor I/O. Checking daily weights. Low salt diet to continue 8. Ischemic colitis: Rectal bleeding resolved. To continue Cipro and Flagyl --thru Sunday 8/7  -moved bowels thursday 9. NSTEMI: Echo with normal function. Treated medically with Cardiology following at a distance.  10. Solitary lung nodule: Follow up CT chest in 6-12 months.  11. Acute on chronic renal failure: Lactic acidosis resolved. Creatinine trending down. Avoid nephrotoxic drugs.   --Cr will monitor on low dose Lasix 12. ABLA: continue iron supplement. Recheck next week 13. Depression with anxiety disorder: Continue Wellbutrin daily and Klonopin bid. 14. Fluid overload: Resolved. Off lasix, 1+-2+ edema on exam, resume low dose 14.  Stasis dermatitis, compression hose   LOS (Days) 5 A FACE TO FACE EVALUATION WAS PERFORMED  Charlett Blake 06/27/2015 6:57 AM

## 2015-06-27 NOTE — Progress Notes (Signed)
Physical Therapy Session Note  Patient Details  Name: Sergio Griffin MRN: [id number] Date of Birth: 18-May-1951  Today's Date: 06/27/2015 PT Individual Time: 0800-0900 and 1500-1530 PT Individual Time Calculation (min): 60 min and 30 min (total 90 min)   Short Term Goals: Week 1:  PT Short Term Goal 1 (Week 1): =LTG's due to ELOS  Skilled Therapeutic Interventions/Progress Updates:    0800-0900 session: Pt received standing at sink with NA present while performing grooming tasks with supervision. Seated rest break required prior to next activity due to fatigue. Gait with RW x150' from room to therapy gym with supervision; pt notes occasional leaning towards R side which he says is his strong side. Dynamic balance tasks performed without use of AD with pt locating and retrieving objects in gym at variable heights including over head and on the floor. Second trial performed with pt placing objects, then locating and retrieving after a short rest break to challenge short term memory and recall, visual scanning, as well as cognitive dual task with balance. Required min cues for recall of 2 objects' locations. Stair training 3 x 8 six inch stairs with seated rest breaks between due to fatigue. Performed with R handrail only as pt reports that is how home environment is setup. Nustep x8 min on level 5 with no rest breaks, however pt very fatigued after completion. States he is feeling stronger and more steady today than previous days, feels as if he is making good progress. Excited that he was able to squat to pick up objects on the floor and get back up without assistance. Pt returned to room with totalA due to fatigue. Left seated in w/c with all items within reach.   1500-1530 session: Pt received seated in w/c with no c/o pain, agreeable to treatment. Pt instructed in seated and standing LE strengthening exercises and given handout to A with recall and consistency of performance. Performed seated hip  flexion, long arc quads, hip adduction with pillow squeeze, hip abduction with orange band. Standing hip flexion, hip abduction, hamstring curl. 5x sit <>stand 27.69 seconds (age norm for 69-69 yrs old 11.4 sec). Several extended seated rest breaks needed throughout due to pt fatigue; required cues for pursed lip breathing. Pt given demonstration of ankle pumps, heel slides and quad sets and unable to practice due to time constraints. Pt left seated in w/c with all needs within reach.   Therapy Documentation Precautions:  Precautions Precautions: Fall Precaution Comments: pt reports fall 3 mos PTA due to poor vision and tripped Restrictions Weight Bearing Restrictions: No Pain: Pain Assessment Pain Assessment: No/denies pain Pain Score: 0-No pain Locomotion : Ambulation Ambulation/Gait Assistance: 5: Supervision   See FIM for current functional status  Therapy/Group: Individual Therapy  Luberta Mutter 06/27/2015, 8:53 AM

## 2015-06-27 NOTE — Progress Notes (Signed)
Physical Therapy Session Note  Patient Details  Name: Sergio Griffin MRN: [id number] Date of Birth: [birth year]  Today's Date: 06/27/2015 PT Individual Time: 1130-1200 PT Individual Time Calculation (min): 30 min   Short Term Goals: Week 1:  PT Short Term Goal 1 (Week 1): =LTG's due to ELOS  Skilled Therapeutic Interventions/Progress Updates:   Session focused on functional gait training without AD to challenge balance at close S level to and from therapy (decreased arm sway and ability to turn head during gait), simulated car transfer to prepare for d/c to sedan height at S level with cues for initial technique, and administered FTSS (see results below) to assess fall risk. D/c planning discussed with pt in regards to energy conservation - pt verbalized understanding. Left up in w/c at end of session with all needs in reach.  Five times Sit to Stand Test (FTSS) Method: Use a straight back chair with a solid seat that is 16-18" high. Ask participant to sit on the chair with arms folded across their chest.   Instructions: "Stand up and sit down as quickly as possible 5 times, keeping your arms folded across your chest."   Measurement: Stop timing when the participant stands the 5th time.  TIME: __1st trial: 21 seconds; 2nd trial: 19 seconds  Times > 13.6 seconds is associated with increased disability and morbidity (Guralnik, 2000) Times > 15 seconds is predictive of recurrent falls in healthy individuals aged 41 and older (Buatois, et al., 2008) Normal performance values in community dwelling individuals aged 63 and older (Bohannon, 2006): o 60-69 years: 11.4 seconds o 70-79 years: 12.6 seconds o 80-89 years: 14.8 seconds  MCID: ? 2.3 seconds for Vestibular Disorders Mariah Milling, 2006)   Therapy Documentation Precautions:  Precautions Precautions: Fall Precaution Comments: pt reports fall 3 mos PTA due to poor vision and tripped Restrictions Weight Bearing Restrictions:  No Pain:  Denies pain.  See FIM for current functional status  Therapy/Group: Individual Therapy  Canary Brim Ivory Broad, PT, DPT  06/27/2015, 2:49 PM

## 2015-06-27 NOTE — Progress Notes (Signed)
Occupational Therapy Session Note  Patient Details  Name: Sergio Griffin MRN: [id number] Date of Birth: [birth year]  Today's Date: 06/27/2015 OT Individual Time: 4098-1191 OT Individual Time Calculation (min): 63 min    Skilled Therapeutic Interventions/Progress Updates:    Pt ambulated with min assist to the tub/shower room for practice stepping in and out of the tub.  He needed min assist for balance at times with walking secondary to LOB to the right, with delayed reaction.  In the tub room he was able to successfully step into and out of the tub with mod instructional cueing to technique.  He performed this multiple times, stabilizing his UEs on the wall.  Discussed use of suction cup grab bar for steadiness only if needed, unless he decides to have some permanently installed.  Transitioned to the regular bed in the apartment as well.  He was able to get in and out of the bed with modified independence on multiple occasions.  He reports feeling stronger in his legs as he previously had trouble lifting his LEs in to the bed, per his report.  Next had pt work on BUE therex exercises in standing.  He performed 1 set of 12 reps for shoulder flexion, shoulder abduction, shoulder extension, elbow flexion.  Issued handout for shoulder exercises as well as green and orange theraband.  He was able to use the green theraband for all of his exercises this session.  Pt ambulated back to the room with min guard assist.   Therapy Documentation Precautions:  Precautions Precautions: Fall Precaution Comments: pt reports fall 3 mos PTA due to poor vision and tripped Restrictions Weight Bearing Restrictions: No  Pain: Pain Assessment Pain Assessment: No/denies pain ADL: See FIM for current functional status  Therapy/Group: Individual Therapy  Orlo Brickle OTR/L 06/27/2015, 3:54 PM

## 2015-06-27 NOTE — Progress Notes (Signed)
Physical Therapy Session Note  Patient Details  Name: Sergio Griffin MRN: [id number] Date of Birth: 1950/12/18  Today's Date: 06/27/2015 PT Individual Time: 1100-1130 PT Individual Time Calculation (min): 30 min   Short Term Goals: Week 1:  PT Short Term Goal 1 (Week 1): =LTG's due to ELOS  Skilled Therapeutic Interventions/Progress Updates:    Pt received in room, agreeable to therapy. Pt ambulated 150' with RW to gym with mod I. PT instructed patient in LE ther-ex for strengthening and to improve activity tolerance. Pt performed LAQ sitting edge of mat (x15 reps), and standing minisquats, hamstring curls, and heel/toe raises x15. Pt performed side stepping to R and L at rail 2 lengths of the rail each side. Pt performed NuStep at 2 resistance x5 minutes with BORG RPE reported between 10-13. Pt ambulated 150' back to room with no AD and supervision for safety. PT provided ongoing education about strengthening LEs to improve balance and energy conservation techniques to improve activity tolerance. Pt left sitting in w/c with needs in reach.   Therapy Documentation Precautions:  Precautions Precautions: Fall Precaution Comments: pt reports fall 3 mos PTA due to poor vision and tripped Restrictions Weight Bearing Restrictions: No  Pain: Pain Assessment Pain Assessment: No/denies pain  See FIM for current functional status  Therapy/Group: Individual Therapy  [name] 06/27/2015, 3:43 PM

## 2015-06-28 ENCOUNTER — Inpatient Hospital Stay (HOSPITAL_COMMUNITY): Payer: Medicare Other

## 2015-06-28 ENCOUNTER — Inpatient Hospital Stay (HOSPITAL_COMMUNITY): Payer: Medicare Other | Admitting: *Deleted

## 2015-06-28 ENCOUNTER — Inpatient Hospital Stay (HOSPITAL_COMMUNITY): Payer: Medicare Other | Admitting: Occupational Therapy

## 2015-06-28 ENCOUNTER — Inpatient Hospital Stay (HOSPITAL_COMMUNITY): Payer: Medicare Other | Admitting: Physical Therapy

## 2015-06-28 NOTE — Progress Notes (Signed)
Occupational Therapy Session Note  Patient Details  Name: Sergio Griffin MRN: [id number] Date of Birth: Mar 09, 1951  Today's Date: 06/28/2015 OT Individual Time: 0830-1000 OT Individual Time Calculation (min): 90 min    Short Term Goals: Week 1:  OT Short Term Goal 1 (Week 1): STG = LTGs due to ELOS  Skilled Therapeutic Interventions/Progress Updates:    Engaged in ADL retraining with focus on activity tolerance, functional mobility, sit <> stand, standing balance, and use of AE to increase independence with self-care tasks.  Pt ambulated to dresser with RW and sidestepped through narrow pathway to gather clothing in prep for shower.  Supervision ambulation with RW to shower.  Pt Mod I with bathing with use of long handled sponge and requiring increased time.  Dressing completed from EOB with demonstration of proper use of AE.  Pt reports having reacher from home that son brought in, finished dressing tasks with personal reacher.  Ambulated around room without RW with supervision to gather clothing and towels from floor to place in laundry.  Grooming completed in standing at sink overall Mod I.  Pt required multiple rest breaks throughout session, but overall demonstrated increased activity tolerance and recall of pursed lip breathing techniques.  Discussed d/c plan and progress towards goals, with pt agreeable to Thursday.  Therapy Documentation Precautions:  Precautions Precautions: Fall Precaution Comments: pt reports fall 3 mos PTA due to poor vision and tripped Restrictions Weight Bearing Restrictions: No Pain: Pain Assessment Pain Assessment: No/denies pain Pain Score: 0-No pain  See FIM for current functional status  Therapy/Group: Individual Therapy  Simonne Come 06/28/2015, 12:28 PM

## 2015-06-28 NOTE — Progress Notes (Addendum)
[place] [facility]ICINE & REHABILITATION     PROGRESS NOTE    Subjective/Complaints: CC Feels like bowels are not emptying  No abd pain but suprapubic fullness, Daily BMs reorded, no naueas or vomiting, stools have normal consistency   ROS: Pt denies headache, blurred or double vision, nausea, vomiting,   diarrhea, chest pain,  ,+ dry eyes uses moisturizing drops  Objective: Vital Signs: Blood pressure 132/61, pulse 66, temperature 97.7 F (36.5 C), temperature source Oral, resp. rate 17, height 5\' 11"  (1.803 m), weight 135.1 kg (297 lb 13.5 oz), SpO2 96 %. No results found. No results for input(s): WBC, HGB, HCT, PLT in the last 72 hours.  Recent Labs  06/27/15 1038  NA 136  K 4.9  CL 105  GLUCOSE 90  BUN 12  CREATININE 1.20  CALCIUM 8.7*   CBG (last 3)  No results for input(s): GLUCAP in the last 72 hours.  Wt Readings from Last 3 Encounters:  06/28/15 135.1 kg (297 lb 13.5 oz)  06/22/15 128 kg (282 lb 3 oz)    Physical Exam:  Constitutional: He is oriented to person, place, and time. He appears well-developed and well-nourished. No distress.  obese  HENT:  Head: Normocephalic and atraumatic.  Eyes: Conjunctivae are normal. Pupils are equal, round, and reactive to light.  Neck: Normal range of motion. Neck supple. No JVD present. No tracheal deviation present. No thyromegaly present.  Cardiovascular: Normal rate, regular rhythm, normal heart sounds and intact distal pulses. Exam reveals no friction rub.  No murmur heard. Respiratory: Effort normal and breath sounds normal. No respiratory distress. He has no wheezes. He has no rales.  GI: Soft. He exhibits no distension. There is no tenderness.  Bowel sounds reduced  Musculoskeletal: He exhibits edema 1+ BLE Lymphadenopathy:   He has no cervical adenopathy.  Neurological: He is alert and oriented to person, place, and time. No cranial nerve deficit. He exhibits normal muscle tone. Coordination normal.   Speech clear. Follows commands without difficulty.UE strength grossly 4+ to 5/5 deltoids, biceps, triceps, HI. LE: 4/5 hf, ke and 5/5 adf/apf. No sensory findings. Cognitively appropriate  Skin: Skin is warm and dry. Stasis dermatitis in BLE no open areas.  Psychiatric: He has a normal mood and affect. His behavior is normal. Judgment and thought content normal.     Assessment/Plan: 1. Functional deficits secondary to debility related to ischemic colitis which require 3+ hours per day of interdisciplinary therapy in a comprehensive inpatient rehab setting. Physiatrist is providing close team supervision and 24 hour management of active medical problems listed below. Physiatrist and rehab team continue to assess barriers to discharge/monitor patient progress toward functional and medical goals. FIM: FIM - Bathing Bathing Steps Patient Completed: Chest, Right Arm, Left Arm, Abdomen, Front perineal area, Buttocks, Right upper leg, Left upper leg, Right lower leg (including foot), Left lower leg (including foot) Bathing: 6: Assistive device (Comment)  FIM - Upper Body Dressing/Undressing Upper body dressing/undressing steps patient completed: Thread/unthread right sleeve of pullover shirt/dresss, Thread/unthread left sleeve of pullover shirt/dress, Put head through opening of pull over shirt/dress, Pull shirt over trunk Upper body dressing/undressing: 6: More than reasonable amount of time FIM - Lower Body Dressing/Undressing Lower body dressing/undressing steps patient completed: Thread/unthread left underwear leg, Pull underwear up/down, Thread/unthread right pants leg, Thread/unthread right underwear leg, Thread/unthread left pants leg, Pull pants up/down, Don/Doff right shoe, Don/Doff left shoe, Don/Doff right sock, Don/Doff left sock Lower body dressing/undressing: 5: Supervision: Safety issues/verbal cues  FIM - Toileting Toileting steps completed by patient: Adjust clothing prior to  toileting, Performs perineal hygiene, Adjust clothing after toileting Toileting Assistive Devices: Grab bar or rail for support Toileting: 6: More than reasonable amount of time  FIM - Radio producer Devices: Environmental consultant, Product manager Transfers: 5-To toilet/BSC: Supervision (verbal cues/safety issues), 5-From toilet/BSC: Supervision (verbal cues/safety issues)  FIM - Control and instrumentation engineer Devices: Walker, Arm rests Bed/Chair Transfer: 5: Bed > Chair or W/C: Supervision (verbal cues/safety issues), 5: Chair or W/C > Bed: Supervision (verbal cues/safety issues)  FIM - Locomotion: Wheelchair Distance: 150 Locomotion: Wheelchair: 0: Activity did not occur (gait to and from therapy) FIM - Locomotion: Ambulation Locomotion: Ambulation Assistive Devices:  (no AD) Ambulation/Gait Assistance: 5: Supervision Locomotion: Ambulation: 5: Travels 150 ft or more with supervision/safety issues  Comprehension Comprehension Mode: Auditory Comprehension: 5-Understands complex 90% of the time/Cues < 10% of the time  Expression Expression Mode: Verbal Expression: 5-Expresses basic 90% of the time/requires cueing < 10% of the time.  Social Interaction Social Interaction: 7-Interacts appropriately with others - No medications needed.  Problem Solving Problem Solving: 5-Solves complex 90% of the time/cues < 10% of the time  Memory Memory: 7-Complete Independence: No helper  Medical Problem List and Plan: 1. Functional deficits secondary to Debility related to ischemic colitis and multiple medical issues.   -improving stamina  -needs to be mod I at home 2. DVT Prophylaxis/Anticoagulation: Pharmaceutical: Lovenox  indicated until mobility increases 3. Pain Management: Tylenol prn for pain control. Denies abdominal pain currently.  4. Mood: LCSW to follow for evaluation and support. Appears motivated and up beat. 5. Neuropsych: This patient  is capable of making decisions on his own behalf. 6. Skin/Wound Care:   Routine pressure relief measures.  7. Fluids/Electrolytes/Nutrition: Monitor I/O. Checking daily weights. Low salt diet to continue 8. Ischemic colitis: Rectal bleeding resolved. off Cipro and Flagyl   -moved bowels 8/8 9. NSTEMI: Echo with normal function. Treated medically with Cardiology following at a distance.  10. Solitary lung nodule: Follow up CT chest in 6-12 months.  11. Acute on chronic renal failure: Lactic acidosis resolved. Creatinine trending down. Avoid nephrotoxic drugs.   --Cr will monitor on low dose Lasix 12. ABLA: continue iron supplement. Recheck next week 13. Depression with anxiety disorder: Continue Wellbutrin daily and Klonopin bid. 14. Fluid overload: Resolved. Off lasix, 1+-2+ edema on exam, resume low dose, pt drinking a lot 8-10 cups of water per day to avoid constipation 14.  Stasis dermatitis, compression hose, pt didn't receive   LOS (Days) 6 A FACE TO FACE EVALUATION WAS PERFORMED  Charlett Blake 06/28/2015 6:39 AM

## 2015-06-28 NOTE — Progress Notes (Signed)
Social Work Patient ID: Sergio Griffin, male   DOB: 1951-04-29, 64 y.o.   MRN: [id number] Team feels pt will be ready for discharge Thursday want to work on ambulation without assistive device. MD is agreeable to this and pt feels comfortable with this plan. He feels he will be able to practice what the therapist have been teaching him. Will work on discharge for Thursday.

## 2015-06-28 NOTE — Progress Notes (Signed)
Recreational Therapy Session Note  Patient Details  Name: Sergio Griffin MRN: [id number] Date of Birth: [birth year] Today's Date: 06/28/2015  Pain:no c/o  Order received and chart reviewed.  Met with pt today to discuss leisure interests and community resources.  Pt able to identify potential activities for mod I participation at discharge but stated interest and need for social interaction as he feels isolated at times.  Discussed potential community resources and encouraged pt to inquire about available programming.  Pt is scheduled for discharge on Thursday, no further TR implemented. [name] 06/28/2015, 3:37 PM

## 2015-06-28 NOTE — Progress Notes (Signed)
Physical Therapy Session Note  Patient Details  Name: Sergio Griffin MRN: [id number] Date of Birth: [birth year]  Today's Date: 06/28/2015 PT Individual Time: 1100-1200 and 1400-1500 PT Individual Time Calculation (min): 60 min and 60 min (total 120 min)   Short Term Goals: Week 1:  PT Short Term Goal 1 (Week 1): =LTG's due to ELOS  Skilled Therapeutic Interventions/Progress Updates:    1100-1200: Pt received in recliner; no c/o pain and agreeable to treatment. Discussed with pt upcoming d/c plan; pt stating he feels he would benefit from one more day of therapy to improve confidence with progression of activities and safety upon d/c home. Discussed with pt progress he has already made with balance, and that one more day of therapy will likely not improve endurance significantly. However pt will benefit from additional tx to solidify HEP, educate pt on progression of activity, and discuss home safety. Nustep performed x15 min on level 4 with no rest breaks needed due to fatigue until completion of activity; performed to improve aerobic endurance for carryover into gait for community distances. Pt reports enjoying Nustep and also mentioned he has an elliptical at home; discussed safe progression and symptom-limited activity. Gait training with rollator d/t pt report of feeling more confident with light UE support, as well as benefit of having seat available for rest breaks. 3 trials of 250-300' each before requiring 2-3 min seated rest break. Pt returned to room and remained seated in recliner with all needs within reach at completion of session.   1400-1500: Pt received in recliner with no c/o pain and agreeable to treatment. Pt states he would like to work on endurance and balance with reaching objects on the floor as he does not want to carry a reacher around the house with him. Gait with rollator 1 trial of 150' with supervision, 1 trial x 300' with supervision. Pt instructed and educated in use of  Ted hose to reduce edema and improve wound healing. Pt donned hose with supervision and cues for technique. Stair training 1 trial of 12 stairs, 1 trial of 16 stairs with R handrail and extended seated rest break between trials due to fatigue. Balance training with retrieving beanbags from floor. Able to retried 3-4 at a time with low squat before requiring a rest break due to fatigue. One major LOB requiring maxA and use of nearby chair to recover to seated position. Pt returned to room and remained seated in recliner at completion of session with all needs within reach.   Therapy Documentation Precautions:  Precautions Precautions: Fall Precaution Comments: pt reports fall 3 mos PTA due to poor vision and tripped Restrictions Weight Bearing Restrictions: No Pain: Pain Assessment Pain Assessment: No/denies pain Pain Score: 0-No pain Locomotion : Ambulation Ambulation/Gait Assistance: 5: Supervision   See FIM for current functional status  Therapy/Group: Individual Therapy  Luberta Mutter 06/28/2015, 12:13 PM

## 2015-06-29 ENCOUNTER — Inpatient Hospital Stay (HOSPITAL_COMMUNITY): Payer: Medicare Other | Admitting: Physical Therapy

## 2015-06-29 ENCOUNTER — Inpatient Hospital Stay (HOSPITAL_COMMUNITY): Payer: Medicare Other | Admitting: Occupational Therapy

## 2015-06-29 LAB — CREATININE, SERUM
CREATININE: 1.31 mg/dL — AB (ref 0.61–1.24)
GFR calc non Af Amer: 56 mL/min — ABNORMAL LOW (ref >60)

## 2015-06-29 NOTE — Progress Notes (Signed)
Physical Therapy Session Note  Patient Details  Name: Sergio Griffin MRN: [id number] Date of Birth: 09/11/1951  Today's Date: 06/29/2015 PT Individual Time: 1000-1100 PT Individual Time Calculation (min): 60 min   Short Term Goals: Week 1:  PT Short Term Goal 1 (Week 1): =LTG's due to ELOS  Skilled Therapeutic Interventions/Progress Updates:    Gait Training: Pt ambulates 150' x2 with rollator and mod I. Pt ambulates household distances with no AD and mod I. Pt negotiated 12 steps x2 with single hand rail and mod I.   Therapeutic Activity: Pt performs supine<>sit and sit<>stand transfers with mod I.  Pt performs car transfer with mod I and is able to explain transfer technique to PT prior to completing transfer. Pt instructed in OTAGO C for HEP (side stepping, tandem walking, heel/toe walking, SLS, figure 8 pattern). PT and patient discussed pt concerns about going home and problem solved solutions. Pt returned to room at end of session and left with needs in reach.   Therapy Documentation Precautions:  Precautions Precautions: Fall Precaution Comments: pt reports fall 3 mos PTA due to poor vision and tripped Restrictions Weight Bearing Restrictions: No  See FIM for current functional status  Therapy/Group: Individual Therapy  Zareah Hunzeker E Penven-Crew 06/29/2015, 12:30 PM

## 2015-06-29 NOTE — Progress Notes (Signed)
Social Work Patient ID: [name], male   DOB: 1951/04/04, 64 y.o.   MRN: [id number] Met with pt to discuss team conference and progression toward his goals. He is very pleased with his progress and feels his is doing better than 3-4 months ago when he started to decline. In agreement with no follow up at discharge and will work on his own and stay active. Rollator ordered. Set for discharge tomorrow.

## 2015-06-29 NOTE — Progress Notes (Signed)
Nutrition Brief Note  RD consulted to aid patient in how to chose his meals using the menu. Disccussed foods that were recommended and not recommended while on a heart healthy diet. Additionally discussed a general overall heart healthy diet for the patient to follow once discharged. Pt expressed understanding.   Pt currently reports having a good appetite with no other difficulties. Meal completion has been 100%. No further nutrition interventions at this time. Please re-consult RD if needed.   Sergio Parker, MS, RD, LDN Pager # [phone] After hours/ weekend pager # [phone]

## 2015-06-29 NOTE — Progress Notes (Signed)
Occupational Therapy Session Note  Patient Details  Name: Sergio Griffin MRN: [id number] Date of Birth: Oct 08, 1951  Today's Date: 06/29/2015 OT Individual Time: 1300-1400 OT Individual Time Calculation (min): 60 min    Short Term Goals: Week 1:  OT Short Term Goal 1 (Week 1): STG = LTGs due to ELOS  Skilled Therapeutic Interventions/Progress Updates:    Engaged in therapeutic activity with focus on ADL retraining and home management tasks.  Pt already dressed prior to session reporting completing dressing at Mod I level with PT this morning.  Educated on various techniques to increase independence with donning TED hose.  Pt return demonstrated and verbalized improvements and ease with education.  Pt with questions about wearing scheduled with TED hose, answered all questions to pt satisfaction.  Ambulated to ADL apt with Rollator, discussed home layout and reports not planning to use Rollator in home but for outings and long walks.  Engaged in simulated meal prep in kitchen without AD with pt gathering items from low cabinets and refrigerator.  Discussed use of chair in kitchen to allow rest breaks as needed.  Discussed laundry tasks and simple housekeeping tasks with pt demonstrating and verbalizing safe strategies.  Pt passed off to PT.  Therapy Documentation Precautions:  Precautions Precautions: Fall Precaution Comments: pt reports fall 3 mos PTA due to poor vision and tripped Restrictions Weight Bearing Restrictions: No Pain: Pain Assessment Pain Assessment: No/denies pain  See FIM for current functional status  Therapy/Group: Individual Therapy  Simonne Come 06/29/2015, 3:53 PM

## 2015-06-29 NOTE — Progress Notes (Signed)
Social Work Elease Hashimoto, LCSW Social Worker Signed  Patient Care Conference 06/29/2015  1:37 PM    Expand All Collapse All   Inpatient RehabilitationTeam Conference and Plan of Care Update Date: 06/29/2015   Time: 10;45 AM     Patient Name: Sergio Griffin       Medical Record Number: [id number]  Date of Birth: August 13, 1951 Sex: Male         Room/Bed: 4M05C/4M05C-01 Payor Info: Payor: MEDICARE / Plan: MEDICARE PART A AND B / Product Type: *No Product type* /    Admitting Diagnosis: colitis   Admit Date/Time:  06/22/2015  7:16 PM Admission Comments: No comment available   Primary Diagnosis:  Physical debility Principal Problem: Physical debility    Patient Active Problem List     Diagnosis  Date Noted   .  Physical debility  06/22/2015   .  DOE (dyspnea on exertion)     .  Acute on chronic renal failure     .  Hematochezia     .  Abnormal CT scan, colon     .  Acute pulmonary edema     .  Hypokalemia     .  Acute blood loss anemia     .  Ischemic colitis     .  Essential hypertension     .  Infectious colitis     .  Acute cystitis without hematuria     .  Pulmonary nodule     .  Abdominal pain, acute     .  Blood poisoning     .  Hyperkalemia  06/11/2015   .  CAD (coronary artery disease), native coronary artery  06/11/2015   .  Wide-complex tachycardia  06/11/2015   .  Abdominal pain, generalized     .  CAD in native artery     .  Elevated troponin     .  UTI (lower urinary tract infection)     .  Solitary pulmonary nodule     .  Colitis  06/10/2015   .  NSTEMI (non-ST elevated myocardial infarction)  06/10/2015   .  Acute on chronic kidney failure  06/10/2015   .  Lactic acidosis  06/10/2015     Expected Discharge Date: Expected Discharge Date: 06/30/15  Team Members Present: Physician leading conference: Dr. Alysia Penna Social Worker Present: Ovidio Kin, LCSW Nurse Present: Heather Roberts, RN PT Present: Raylene Everts, PT;Other (comment) Rudene Christians  Tygielski-PT) OT Present: Willeen Cass, Jules Schick, OT SLP Present: Windell Moulding, SLP PPS Coordinator present : Daiva Nakayama, RN, CRRN        Current Status/Progress  Goal  Weekly Team Focus   Medical     swelling in feet, tenesmus, KUB -  Home with Mod I  D/C   Bowel/Bladder     Continent to bowel and bladder.  To continue continent to B & B with min. assisst.   To monitor bladder and bowel function Q shift and PRN.   Swallow/Nutrition/ Hydration       na         ADL's     Mod I bathing and dressing, supervision tub/shower transfers   Mod I overall  activity tolearnce, balance, d/c planning    Mobility     S/steady A without AD  mod I overall  balance, endurance, stairs, HEP education in preparation for d/c   Communication       na  Safety/Cognition/ Behavioral Observations      no unsafe behaviors         Pain     No complain of pain.  To keep pain levels less than 3 on scale 1 to 10.  To assess pain levels Q 2-3 hrs. and PRN.   Skin     Skin is dry and intact  To keep skin free of pressure ulcers.   To monitor skin Q shift,and PRN      *See Care Plan and progress notes for long and short-term goals.    Barriers to Discharge:  none      Possible Resolutions to Barriers:   D/C in am     Discharge Planning/Teaching Needs:   Home alone with intermittent assist-made good gains in therapy home tomorrow       Team Discussion:    Goals-mod/i level-endurance better and balance issues. Ted hose on now-drinking too much water-scaling back. Nutritionist to see today. Medically stable and ready for discharge tomorrow. No follow up recommended   Revisions to Treatment Plan:    DC tomorrow    Continued Need for Acute Rehabilitation Level of Care: The patient requires daily medical management by a physician with specialized training in physical medicine and rehabilitation for the following conditions: Daily direction of a multidisciplinary physical rehabilitation  program to ensure safe treatment while eliciting the highest outcome that is of practical value to the patient.: Yes Daily medical management of patient stability for increased activity during participation in an intensive rehabilitation regime.: Yes Daily analysis of laboratory values and/or radiology reports with any subsequent need for medication adjustment of medical intervention for : Neurological problems;Other  Elease Hashimoto 06/29/2015, 1:38 PM                  Patient ID: Sergio Griffin, male   DOB: 09-05-1951, 64 y.o.   MRN: [id number]

## 2015-06-29 NOTE — Progress Notes (Signed)
Occupational Therapy Discharge Summary  Patient Details  Name: Sergio Griffin MRN: [id number] Date of Birth: [birth year]  Patient has met 62 of 11 long term goals due to improved activity tolerance, postural control, ability to compensate for deficits and improved awareness.  Patient to discharge at overall Modified Independent level.  Patient lives alone and is discharging at overall Mod I level, therefore no family education completed.  Reasons goals not met: NA  Recommendation:  Patient will not require OT follow up at this time.  Equipment: No equipment provided  Reasons for discharge: treatment goals met and discharge from hospital  Patient/family agrees with progress made and goals achieved: Yes  OT Discharge Precautions/Restrictions  Precautions Precautions: Fall Restrictions Weight Bearing Restrictions: No General   Vital Signs Therapy Vitals Temp: 98.8 F (37.1 C) Temp Source: Oral Pulse Rate: 83 Resp: 14 BP: 122/65 mmHg Patient Position (if appropriate): Sitting Oxygen Therapy SpO2: 96 % O2 Device: Not Delivered Pain Pain Assessment Pain Assessment: No/denies pain ADL  See FIM Vision/Perception  Vision- History Baseline Vision/History: Wears glasses;Glaucoma Wears Glasses: At all times Patient Visual Report: No change from baseline  Cognition Overall Cognitive Status: Within Functional Limits for tasks assessed Arousal/Alertness: Awake/alert Orientation Level: Oriented X4 Attention: Alternating;Selective Selective Attention: Appears intact Alternating Attention: Appears intact Memory: Appears intact Awareness: Appears intact Problem Solving: Appears intact Safety/Judgment: Appears intact Sensation Sensation Light Touch: Appears Intact Stereognosis: Not tested Hot/Cold: Appears Intact Proprioception: Appears Intact Coordination Gross Motor Movements are Fluid and Coordinated: Yes Fine Motor Movements are Fluid and Coordinated: Yes (mild  tremor noted in Lt hand with finger opposition) Finger Nose Finger Test: mild overshooting, worse in R visual field Heel Shin Test: [facility] Motor  Motor Motor - Discharge Observations: improved weakness, balance and activity tolerance overall Extremity/Trunk Assessment RUE Assessment RUE Assessment: Within Functional Limits LUE Assessment LUE Assessment: Within Functional Limits  See FIM for current functional status  [name], [facility] 06/29/2015, 4:03 PM

## 2015-06-29 NOTE — Progress Notes (Signed)
[place] PHYSICAL MEDICINE & REHABILITATION     PROGRESS NOTE    Subjective/Complaints: CC Feels like bowels are not emptying    ROS: Pt denies headache, blurred or double vision, nausea, vomiting,   diarrhea, chest pain,  ,+ dry eyes uses moisturizing drops  Objective: Vital Signs: Blood pressure 132/71, pulse 66, temperature 98.4 F (36.9 C), temperature source Oral, resp. rate 17, height 5\' 11"  (1.803 m), weight 134.7 kg (296 lb 15.4 oz), SpO2 96 %. Dg Abd 1 View  06/28/2015   CLINICAL DATA:  Constipation  EXAM: ABDOMEN - 1 VIEW  COMPARISON:  None.  FINDINGS: Scattered large and small bowel gas is noted. A mild amount of fecal material is noted within the colon. No free air is seen. No obstructive changes are. The osseous structures show some degenerative change of the lumbar spine.  IMPRESSION: No acute abnormality noted.   Electronically Signed   By: Inez Catalina M.D.   On: 06/28/2015 10:37   No results for input(s): WBC, HGB, HCT, PLT in the last 72 hours.  Recent Labs  06/27/15 1038  NA 136  K 4.9  CL 105  GLUCOSE 90  BUN 12  CREATININE 1.20  CALCIUM 8.7*   CBG (last 3)  No results for input(s): GLUCAP in the last 72 hours.  Wt Readings from Last 3 Encounters:  06/29/15 134.7 kg (296 lb 15.4 oz)  06/22/15 128 kg (282 lb 3 oz)    Physical Exam:  Constitutional: He is oriented to person, place, and time. He appears well-developed and well-nourished. No distress.  obese  HENT:  Head: Normocephalic and atraumatic.  Eyes: Conjunctivae are normal. Pupils are equal, round, and reactive to light.  Neck: Normal range of motion. Neck supple. No JVD present. No tracheal deviation present. No thyromegaly present.  Cardiovascular: Normal rate, regular rhythm, normal heart sounds and intact distal pulses. Exam reveals no friction rub.  No murmur heard. Respiratory: Effort normal and breath sounds normal. No respiratory distress. He has no wheezes. He has no rales.   GI: Soft. He exhibits no distension. There is no tenderness.  Bowel sounds reduced  Musculoskeletal: He exhibits edema 1+ BLE Lymphadenopathy:   He has no cervical adenopathy.  Neurological: He is alert and oriented to person, place, and time. No cranial nerve deficit. He exhibits normal muscle tone. Coordination normal.  Speech clear. Follows commands without difficulty.UE strength grossly 4+ to 5/5 deltoids, biceps, triceps, HI. LE: 4/5 hf, ke and 5/5 adf/apf. No sensory findings. Cognitively appropriate  Skin: Skin is warm and dry. Stasis dermatitis in BLE no open areas.  Psychiatric: He has a normal mood and affect. His behavior is normal. Judgment and thought content normal.     Assessment/Plan: 1. Functional deficits secondary to debility related to ischemic colitis which require 3+ hours per day of interdisciplinary therapy in a comprehensive inpatient rehab setting. Physiatrist is providing close team supervision and 24 hour management of active medical problems listed below. Physiatrist and rehab team continue to assess barriers to discharge/monitor patient progress toward functional and medical goals. Stable for D/C in am  FIM: FIM - Bathing Bathing Steps Patient Completed: Chest, Right Arm, Left Arm, Abdomen, Front perineal area, Buttocks, Right upper leg, Left upper leg, Right lower leg (including foot), Left lower leg (including foot) Bathing: 6: Assistive device (Comment)  FIM - Upper Body Dressing/Undressing Upper body dressing/undressing steps patient completed: Thread/unthread right sleeve of pullover shirt/dresss, Thread/unthread left sleeve of pullover shirt/dress, Put head through  opening of pull over shirt/dress, Pull shirt over trunk Upper body dressing/undressing: 6: More than reasonable amount of time FIM - Lower Body Dressing/Undressing Lower body dressing/undressing steps patient completed: Thread/unthread left underwear leg, Pull underwear up/down,  Thread/unthread right pants leg, Thread/unthread right underwear leg, Thread/unthread left pants leg, Pull pants up/down, Don/Doff right shoe, Don/Doff left shoe, Don/Doff right sock, Don/Doff left sock Lower body dressing/undressing: 6: Assistive device (Comment)  FIM - Toileting Toileting steps completed by patient: Adjust clothing prior to toileting, Performs perineal hygiene, Adjust clothing after toileting Toileting Assistive Devices: Grab bar or rail for support Toileting: 6: More than reasonable amount of time  FIM - Radio producer Devices: Product manager Transfers: 5-To toilet/BSC: Supervision (verbal cues/safety issues), 5-From toilet/BSC: Supervision (verbal cues/safety issues)  FIM - Control and instrumentation engineer Devices: Arm rests, Copy: 6: Assistive device: no helper, 6: Supine > Sit: No assist, 6: Bed > Chair or W/C: No assist, 6: Chair or W/C > Bed: No assist  FIM - Locomotion: Wheelchair Distance: 150 Locomotion: Wheelchair: 0: Activity did not occur (gait to and from therapy) FIM - Locomotion: Ambulation Locomotion: Ambulation Assistive Devices: Other (comment) (Rollator) Ambulation/Gait Assistance: 5: Supervision Locomotion: Ambulation: 5: Travels 150 ft or more with supervision/safety issues  Comprehension Comprehension Mode: Auditory Comprehension: 5-Understands basic 90% of the time/requires cueing < 10% of the time  Expression Expression Mode: Verbal Expression: 5-Expresses basic needs/ideas: With no assist  Social Interaction Social Interaction: 7-Interacts appropriately with others - No medications needed.  Problem Solving Problem Solving: 5-Solves basic 90% of the time/requires cueing < 10% of the time  Memory Memory: 7-Complete Independence: No helper  Medical Problem List and Plan: 1. Functional deficits secondary to Debility related to ischemic colitis and multiple medical  issues.   -improving stamina  -needs to be mod I at home 2. DVT Prophylaxis/Anticoagulation: Pharmaceutical: Lovenox  indicated until mobility increases 3. Pain Management: Tylenol prn for pain control. Denies abdominal pain currently.  4. Mood: LCSW to follow for evaluation and support. Appears motivated and up beat. 5. Neuropsych: This patient is capable of making decisions on his own behalf. 6. Skin/Wound Care:   Routine pressure relief measures.  7. Fluids/Electrolytes/Nutrition: Monitor I/O. Checking daily weights. Low salt diet to continue 8. Ischemic colitis: Rectal bleeding resolved. off Cipro and Flagyl   -moved bowels 8/9, still has fullness feeling may have some endothelial edema as residual of colitis, KUB normal 9. NSTEMI: Echo with normal function. Treated medically with Cardiology following at a distance.  10. Solitary lung nodule: Follow up CT chest in 6-12 months.  11. Acute on chronic renal failure: Lactic acidosis resolved. Creatinine trending down. Avoid nephrotoxic drugs.   --Cr will monitor on low dose Lasix 12. ABLA: continue iron supplement. Recheck next week 13. Depression with anxiety disorder: Continue Wellbutrin daily and Klonopin bid. 14. Fluid overload: Resolved. Off lasix, 1+-2+ edema on exam, resume low dose, pt drinking a lot 8-10 cups of water per day to avoid constipation, discussed reducing amt 14.  Stasis dermatitis, compression hose,   LOS (Days) 7 A FACE TO FACE EVALUATION WAS PERFORMED  Natika Geyer E 06/29/2015 6:21 AM

## 2015-06-29 NOTE — Progress Notes (Signed)
Physical Therapy Session Note  Patient Details  Name: Sergio Griffin MRN: [id number] Date of Birth: May 04, 1951  Today's Date: 06/29/2015 PT Individual Time: 1400-1530 PT Individual Time Calculation (min): 90 min   Short Term Goals: Week 1:  PT Short Term Goal 1 (Week 1): =LTG's due to ELOS  Skilled Therapeutic Interventions/Progress Updates:    Pt receive seated on couch in simulated apartment at completion of previous OT session; no c/o pain and agreeable to treatment. Pt expresses concern about being able to load rollator into a vehicle. Practiced folding and lifting rollator onto bed to simulate putting it into a trunk and pt able to perform with supervision. Spent extensive time discussing future lifestyle changes with pt including discussion about appropriate exercise and activities to improve strength and endurance as well as assisting with weight management and stress relief. Pt states he wants to go back to the [facility] where he was a member and used to take water aerobics classes with friends. Gait training performed with rollator and modI in community environments, indoors/outdoors on level and unlevel surfaces including inclines. Pt able to ambulate approximately 5-7 min at a time before requiring seated rest break; distance dependent on type of surfaces, inclines, etc. Nustep on level 5 with average 45 steps/min performed x15 min to improve aerobic endurance for carryover into adl's and gait for functional community distances. Stair training 1 trial ascent/descent x24 six-inch stairs with rest break on middle landing. Minor LOB on last step however pt able to recover without assistance. Pt has difficulty with visual contrast; discussed use of tape/paint lines to help differentiate where stairs are for increased safety. Pt returned to room and remained seated in w/c at end of session all needs within reach. PT and OT primary therapists clear pt for mod I in his room at this time; sign placed  outside door and RN notified.  Therapy Documentation Precautions:  Precautions Precautions: Fall Precaution Comments: pt reports fall 3 mos PTA due to poor vision and tripped Restrictions Weight Bearing Restrictions: No Pain: Pain Assessment Pain Assessment: No/denies pain Locomotion : Ambulation Ambulation/Gait Assistance: 6: Modified independent (Device/Increase time) Wheelchair Mobility Distance:  (pt ambulatory on unit)   See FIM for current functional status  Therapy/Group: Individual Therapy  Luberta Mutter 06/29/2015, 3:07 PM

## 2015-06-29 NOTE — Progress Notes (Signed)
Physical Therapy Discharge Summary  Patient Details  Name: Sergio Griffin MRN: [id number] Date of Birth: 1951-08-31  Today's Date: 06/29/2015 PT Individual Time: 1000-1100 PT Individual Time Calculation (min): 60 min    Patient has met 8 of 8 long term goals due to improved activity tolerance, improved balance, improved postural control and increased strength.  Patient to discharge at an ambulatory level Modified Independent.  Patient will be returning to home where he lives independently; does not have a caregiver required to be safe within the home. Patient is aware of any physical limitations and compensatory strategies and can educate those around him for assistance if needed.  Reasons goals not met: All goals met  Recommendation:  Pt has no further need for skilled PT services at this time. Pt has been thoroughly educated in safe progression in physical activity and exercise program for strength and balance, and is safe to perform independently to continue building on progress made in rehab.   Equipment: rollator  Reasons for discharge: treatment goals met  Patient/family agrees with progress made and goals achieved: Yes  PT Discharge Precautions/Restrictions Precautions Precautions: Fall Restrictions Weight Bearing Restrictions: No Pain Pain Assessment Pain Assessment: No/denies pain Pain Score: 0-No pain Cognition Overall Cognitive Status: Within Functional Limits for tasks assessed Arousal/Alertness: Awake/alert Orientation Level: Oriented X4 Selective Attention: Appears intact Alternating Attention: Appears intact Memory: Appears intact Awareness: Appears intact Problem Solving: Appears intact Sensation Sensation Light Touch: Appears Intact Coordination Gross Motor Movements are Fluid and Coordinated: Yes Fine Motor Movements are Fluid and Coordinated: Yes (mild tremor noted in L hand with finger opposition/abd/add) Finger Nose Finger Test: mild overshooting,  worse in R visual field Heel Shin Test: [facility] Motor  Motor Motor - Discharge Observations: improved weakness, balance and activity tolerance overall  Mobility Bed Mobility Bed Mobility: Sit to Supine;Supine to Sit Supine to Sit: 7: Independent Sit to Supine: 7: Independent Transfers Transfers: Yes Sit to Stand: 6: Modified independent (Device/Increase time) Stand to Sit: 6: Modified independent (Device/Increase time) Stand Pivot Transfers: 6: Modified independent (Device/Increase time) Locomotion  Ambulation Ambulation: Yes Ambulation/Gait Assistance: 6: Modified independent (Device/Increase time) Ambulation Distance (Feet): 150 Feet Assistive device: Rollator Gait Gait: Yes Gait Pattern: Impaired Gait Pattern: Step-through pattern Wheelchair Mobility Wheelchair Mobility: No (pt ambulatory on unit) Distance:  (pt ambulatory on unit)  Trunk/Postural Assessment  Cervical Assessment Cervical Assessment: Exceptions to [facility] Cervical Strength Overall Cervical Strength Comments: forward head posture, slight Thoracic Assessment Thoracic Assessment: Exceptions to [facility] Thoracic Strength Overall Thoracic Strength Comments: forward rounded shoulders Lumbar Assessment Lumbar Assessment: Exceptions to [facility] Lumbar Strength Overall Lumbar Strength Comments: posterior pelvic tilt   Balance Balance Balance Assessed: Yes Static Standing Balance Static Standing - Balance Support: No upper extremity supported Static Standing - Level of Assistance: 6: Modified independent (Device/Increase time) Dynamic Standing Balance Dynamic Standing - Balance Support: No upper extremity supported Dynamic Standing - Level of Assistance: 6: Modified independent (Device/Increase time) Extremity Assessment  RLE Assessment RLE Assessment: Within Functional Limits (4+/5 hip flex, knee flex/ext, DF/PF) LLE Assessment LLE Assessment: Within Functional Limits (4+/5 hip flex, knee flex/ext, DF/PF)  See FIM for  current functional status  Caitlin E Penven-Crew 06/29/2015, 12:26 PM

## 2015-06-29 NOTE — Patient Care Conference (Signed)
Inpatient RehabilitationTeam Conference and Plan of Care Update Date: 06/29/2015   Time: 10;45 AM    Patient Name: Sergio Griffin      Medical Record Number: [id number]  Date of Birth: 11-26-1950 Sex: Male         Room/Bed: 4M05C/4M05C-01 Payor Info: Payor: MEDICARE / Plan: MEDICARE PART A AND B / Product Type: *No Product type* /    Admitting Diagnosis: colitis  Admit Date/Time:  06/22/2015  7:16 PM Admission Comments: No comment available   Primary Diagnosis:  Physical debility Principal Problem: Physical debility  Patient Active Problem List   Diagnosis Date Noted  . Physical debility 06/22/2015  . DOE (dyspnea on exertion)   . Acute on chronic renal failure   . Hematochezia   . Abnormal CT scan, colon   . Acute pulmonary edema   . Hypokalemia   . Acute blood loss anemia   . Ischemic colitis   . Essential hypertension   . Infectious colitis   . Acute cystitis without hematuria   . Pulmonary nodule   . Abdominal pain, acute   . Blood poisoning   . Hyperkalemia 06/11/2015  . CAD (coronary artery disease), native coronary artery 06/11/2015  . Wide-complex tachycardia 06/11/2015  . Abdominal pain, generalized   . CAD in native artery   . Elevated troponin   . UTI (lower urinary tract infection)   . Solitary pulmonary nodule   . Colitis 06/10/2015  . NSTEMI (non-ST elevated myocardial infarction) 06/10/2015  . Acute on chronic kidney failure 06/10/2015  . Lactic acidosis 06/10/2015    Expected Discharge Date: Expected Discharge Date: 06/30/15  Team Members Present: Physician leading conference: Dr. Alysia Penna Social Worker Present: Ovidio Kin, LCSW Nurse Present: Heather Roberts, RN PT Present: Raylene Everts, PT;Other (comment) Rudene Christians Tygielski-PT) OT Present: Willeen Cass, Jules Schick, OT SLP Present: Windell Moulding, SLP PPS Coordinator present : Daiva Nakayama, RN, CRRN     Current Status/Progress Goal Weekly Team Focus  Medical   swelling in feet, tenesmus,  KUB -  Home with Mod I  D/C   Bowel/Bladder   Continent to bowel and bladder.  To continue continent to B & B with min. assisst.  To monitor bladder and bowel function Q shift and PRN.   Swallow/Nutrition/ Hydration     na        ADL's   Mod I bathing and dressing, supervision tub/shower transfers  Mod I overall  activity tolearnce, balance, d/c planning   Mobility   S/steady A without AD  mod I overall  balance, endurance, stairs, HEP education in preparation for d/c   Communication     na        Safety/Cognition/ Behavioral Observations    no unsafe behaviors        Pain   No complain of pain.  To keep pain levels less than 3 on scale 1 to 10.  To assess pain levels Q 2-3 hrs. and PRN.   Skin   Skin is dry and intact  To keep skin free of pressure ulcers.  To monitor skin Q shift,and PRN      *See Care Plan and progress notes for long and short-term goals.  Barriers to Discharge: none     Possible Resolutions to Barriers:  D/C in am    Discharge Planning/Teaching Needs:  Home alone with intermittent assist-made good gains in therapy home tomorrow      Team Discussion:  Goals-mod/i level-endurance better and balance issues. Clare Gandy  hose on now-drinking too much water-scaling back. Nutritionist to see today. Medically stable and ready for discharge tomorrow. No follow up recommended  Revisions to Treatment Plan:  DC tomorrow   Continued Need for Acute Rehabilitation Level of Care: The patient requires daily medical management by a physician with specialized training in physical medicine and rehabilitation for the following conditions: Daily direction of a multidisciplinary physical rehabilitation program to ensure safe treatment while eliciting the highest outcome that is of practical value to the patient.: Yes Daily medical management of patient stability for increased activity during participation in an intensive rehabilitation regime.: Yes Daily analysis of laboratory  values and/or radiology reports with any subsequent need for medication adjustment of medical intervention for : Neurological problems;Other  Brin Ruggerio, Gardiner Rhyme 06/29/2015, 1:38 PM

## 2015-06-30 MED ORDER — ROSUVASTATIN CALCIUM 20 MG PO TABS: 20.0000 mg | ORAL_TABLET | Freq: Every evening | ORAL | Status: DC

## 2015-06-30 MED ORDER — NITROGLYCERIN 0.4 MG SL SUBL: 0.4000 mg | SUBLINGUAL_TABLET | SUBLINGUAL | Status: AC | PRN

## 2015-06-30 MED ORDER — REGADENOSON 0.4 MG/5ML IV SOLN
INTRAVENOUS | Status: AC
  Filled 2015-06-30: qty 5

## 2015-06-30 MED ORDER — SENNOSIDES-DOCUSATE SODIUM 8.6-50 MG PO TABS: 2.0000 | ORAL_TABLET | Freq: Every day | ORAL | Status: DC

## 2015-06-30 MED ORDER — PANTOPRAZOLE SODIUM 40 MG PO TBEC: 40.0000 mg | DELAYED_RELEASE_TABLET | Freq: Two times a day (BID) | ORAL | Status: DC

## 2015-06-30 MED ORDER — FUROSEMIDE 20 MG PO TABS: 10.0000 mg | ORAL_TABLET | Freq: Every day | ORAL | Status: DC

## 2015-06-30 MED ORDER — METOPROLOL TARTRATE 25 MG PO TABS
12.5000 mg | ORAL_TABLET | Freq: Two times a day (BID) | ORAL | Status: DC
Start: 2015-06-30 — End: 2015-08-29

## 2015-06-30 NOTE — Progress Notes (Signed)
[facility]YS[facility]ICINE & REHABILITATION     PROGRESS NOTE    Subjective/Complaints: CC     ROS: Pt denies headache, blurred or double vision, nausea, vomiting,   diarrhea, chest pain,  ,+ dry eyes uses moisturizing drops  Objective: Vital Signs: Blood pressure 141/72, pulse 66, temperature 98.5 F (36.9 C), temperature source Oral, resp. rate 16, height 5\' 11"  (1.803 m), weight 135.6 kg (298 lb 15.1 oz), SpO2 97 %. Dg Abd 1 View  06/28/2015   CLINICAL DATA:  Constipation  EXAM: ABDOMEN - 1 VIEW  COMPARISON:  None.  FINDINGS: Scattered large and small bowel gas is noted. A mild amount of fecal material is noted within the colon. No free air is seen. No obstructive changes are. The osseous structures show some degenerative change of the lumbar spine.  IMPRESSION: No acute abnormality noted.   Electronically Signed   By: Inez Catalina M.D.   On: 06/28/2015 10:37   No results for input(s): WBC, HGB, HCT, PLT in the last 72 hours.  Recent Labs  06/27/15 1038 06/29/15 0615  NA 136  --   K 4.9  --   CL 105  --   GLUCOSE 90  --   BUN 12  --   CREATININE 1.20 1.31*  CALCIUM 8.7*  --    CBG (last 3)  No results for input(s): GLUCAP in the last 72 hours.  Wt Readings from Last 3 Encounters:  06/30/15 135.6 kg (298 lb 15.1 oz)  06/22/15 128 kg (282 lb 3 oz)    Physical Exam:  Constitutional: He is oriented to person, place, and time. He appears well-developed and well-nourished. No distress.  obese  HENT:  Head: Normocephalic and atraumatic.  Eyes: Conjunctivae are normal. Pupils are equal, round, and reactive to light.  Neck: Normal range of motion. Neck supple. No JVD present. No tracheal deviation present. No thyromegaly present.  Cardiovascular: Normal rate, regular rhythm, normal heart sounds and intact distal pulses. Exam reveals no friction rub.  No murmur heard. Respiratory: Effort normal and breath sounds normal. No respiratory distress. He has no wheezes. He  has no rales.  GI: Soft. He exhibits no distension. There is no tenderness.  Bowel sounds reduced  Musculoskeletal: He exhibits edema 1+ BLE Lymphadenopathy:   He has no cervical adenopathy.  Neurological: He is alert and oriented to person, place, and time. No cranial nerve deficit. He exhibits normal muscle tone. Coordination normal.  Speech clear. Follows commands without difficulty.UE strength grossly 4+ to 5/5 deltoids, biceps, triceps, HI. LE: 4/5 hf, ke and 5/5 adf/apf. No sensory findings. Cognitively appropriate  Skin: Skin is warm and dry. Stasis dermatitis in BLE no open areas.  Psychiatric: He has a normal mood and affect. His behavior is normal. Judgment and thought content normal.     Assessment/Plan: 1. Functional deficits secondary to debility related to ischemic colitis   FIM: FIM - Bathing Bathing Steps Patient Completed: Chest, Right Arm, Left Arm, Abdomen, Front perineal area, Buttocks, Right upper leg, Left upper leg, Right lower leg (including foot), Left lower leg (including foot) Bathing: 6: Assistive device (Comment)  FIM - Upper Body Dressing/Undressing Upper body dressing/undressing steps patient completed: Thread/unthread right sleeve of pullover shirt/dresss, Thread/unthread left sleeve of pullover shirt/dress, Put head through opening of pull over shirt/dress, Pull shirt over trunk Upper body dressing/undressing: 6: More than reasonable amount of time FIM - Lower Body Dressing/Undressing Lower body dressing/undressing steps patient completed: Thread/unthread left underwear leg, Pull underwear  up/down, Thread/unthread right pants leg, Thread/unthread right underwear leg, Thread/unthread left pants leg, Pull pants up/down, Don/Doff right shoe, Don/Doff left shoe, Don/Doff right sock, Don/Doff left sock Lower body dressing/undressing: 6: Assistive device (Comment)  FIM - Toileting Toileting steps completed by patient: Adjust clothing prior to toileting,  Performs perineal hygiene, Adjust clothing after toileting Toileting Assistive Devices: Grab bar or rail for support Toileting: 6: More than reasonable amount of time  FIM - Radio producer Devices: Grab bars Toilet Transfers: 5-To toilet/BSC: Supervision (verbal cues/safety issues), 5-From toilet/BSC: Supervision (verbal cues/safety issues)  FIM - Control and instrumentation engineer Devices: Arm rests Bed/Chair Transfer: 6: Supine > Sit: No assist, 6: Bed > Chair or W/C: No assist, 6: Chair or W/C > Bed: No assist, 6: Sit > Supine: No assist  FIM - Locomotion: Wheelchair Distance:  (pt ambulatory on unit) Locomotion: Wheelchair: 0: Activity did not occur FIM - Locomotion: Ambulation Locomotion: Ambulation Assistive Devices: Other (comment) (rollator) Ambulation/Gait Assistance: 6: Modified independent (Device/Increase time) Locomotion: Ambulation: 6: Travels 150 ft or more with assistive device/no helper  Comprehension Comprehension Mode: Auditory Comprehension: 5-Understands basic 90% of the time/requires cueing < 10% of the time  Expression Expression Mode: Verbal Expression: 5-Expresses complex 90% of the time/cues < 10% of the time  Social Interaction Social Interaction: 7-Interacts appropriately with others - No medications needed.  Problem Solving Problem Solving: 5-Solves basic 90% of the time/requires cueing < 10% of the time  Memory Memory: 7-Complete Independence: No helper  Medical Problem List and Plan: 1. Functional deficits secondary to Debility related to ischemic colitis and multiple medical issues.   -now mod I 2. DVT Prophylaxis/Anticoagulation: Pharmaceutical: Lovenox  indicated until mobility increases 3. Pain Management: Tylenol prn for pain control. Denies abdominal pain currently.  4. Mood: LCSW to follow for evaluation and support. Appears motivated and up beat. 5. Neuropsych: This patient is capable of  making decisions on his own behalf. 6. Skin/Wound Care:   Routine pressure relief measures.  7. Fluids/Electrolytes/Nutrition: Monitor I/O. Checking daily weights. Low salt diet to continue 8. Ischemic colitis: Rectal bleeding resolved. off Cipro and Flagyl   -moved bowels 8/10, still has fullness feeling may have some endothelial edema as residual of colitis, KUB normal 9. NSTEMI: Echo with normal function. Treated medically with Cardiology following at a distance.  10. Solitary lung nodule: Follow up CT chest in 6-12 months.  11. Acute on chronic renal failure: Lactic acidosis resolved. Creatinine trending down. Avoid nephrotoxic drugs.   --Cr slightly up will monitor on low dose Lasix, f/u PCP 12. ABLA: continue iron supplement. Recheck next week 13. Depression with anxiety disorder: Continue Wellbutrin daily and Klonopin bid. 14. Fluid overload: Resolved.  14.  Stasis dermatitis, compression hose,   LOS (Days) 8 A FACE TO FACE EVALUATION WAS PERFORMED  Charlett Blake 06/30/2015 7:19 AM

## 2015-06-30 NOTE — Progress Notes (Signed)
Dischaerged to home accompanied by family. Discahrge info given to pt by Algis Liming PAC, not questions noted. WAlker delivered to pt in room and belongings taken with him. Margarito Liner

## 2015-06-30 NOTE — Discharge Instructions (Signed)
Inpatient Rehab Discharge Instructions  Sergio Griffin Discharge date and time:    Activities/Precautions/ Functional Status: Activity: activity as tolerated Diet: cardiac diet Low salt Wound Care: none needed   Functional status:  ___ No restrictions     ___ Walk up steps independently ___ 24/7 supervision/assistance   ___ Walk up steps with assistance ___ Intermittent supervision/assistance  ___ Bathe/dress independently _X__ Walk with walker     ___ Bathe/dress with assistance ___ Walk Independently    ___ Shower independently ___ Walk with assistance    ___ Shower with assistance _X__ No alcohol     ___ Return to work/school ________  Special Instructions: 1.  Social Worker will call you regarding referral/appointment with primary care MD.  2. Wear support stocking when out of bed.    COMMUNITY REFERRALS UPON DISCHARGE:   None:  RECOMMENDED GAVE HOME EXERCISE PROGRAM AND ENCOURAGED TO ATTEND [facility] SNE[facility]ERS    Medical Equipment/Items Ordered:WIDE ROLLATOR [name]  Agency/Supplier:[facility]   [phone]    My questions have been answered and I understand these instructions. I will adhere to these goals and the provided educational materials after my discharge from the hospital.  Patient/Caregiver Signature _______________________________ Date __________  Clinician Signature _______________________________________ Date __________  Please bring this form and your medication list with you to all your follow-up doctor's appointments.

## 2015-06-30 NOTE — Progress Notes (Addendum)
Social Work Discharge Note Discharge Note  The overall goal for the admission was met for:   Discharge location: Yes-HOME WITH INTERMITTENT ASSIST OF SON AND FRIENDS  Length of Stay: Yes-8 DAYS  Discharge activity level: Yes-MOD/I-INDEPENDENT LEVEL  Home/community participation: Yes  Services provided included: MD, RD, PT, OT, RN, CM, TR, Pharmacy and [place]: Medicare  Follow-up services arranged: DME: [facility] and Patient/Family has no preference for HH/DME agencies  Comments (or additional information):PT DID [place] GOT TO MOD/I LEVEL, READY TO [place]. ENCOURAGED TO [facility] PCP FOR PT-GREGORY CALONE-NP APPT 8/23 @ 2;00 PM-INFORMATION GIVNE TO PT  Patient/Family verbalized understanding of follow-up arrangements: Yes  Individual responsible for coordination of the follow-up plan: SELF  Confirmed correct DME delivered: Elease Hashimoto 06/30/2015    Nestor Wieneke, Gard[place] Rhyme

## 2015-06-30 NOTE — Discharge Summary (Signed)
Physician Discharge Summary  Patient ID: Sergio Griffin MRN: [id number] DOB/AGE: Nov 12, 1951 64 y.o.  Admit date: 06/22/2015 Discharge date: 06/30/2015  Discharge Diagnoses:  Principal Problem:   Physical debility Active Problems:   Acute on chronic kidney failure   CAD (coronary artery disease), native coronary artery   Elevated troponin   Solitary pulmonary nodule   Essential hypertension   Ischemic colitis   Acute blood loss anemia   Discharged Condition: Stable.   Significant Diagnostic Studies: Dg Abd 1 View  06/28/2015   CLINICAL DATA:  Constipation  EXAM: ABDOMEN - 1 VIEW  COMPARISON:  None.  FINDINGS: Scattered large and small bowel gas is noted. A mild amount of fecal material is noted within the colon. No free air is seen. No obstructive changes are. The osseous structures show some degenerative change of the lumbar spine.  IMPRESSION: No acute abnormality noted.   Electronically Signed   By: Inez Catalina M.D.   On: 06/28/2015 10:37   Ct Chest Wo Contrast  06/12/2015   CLINICAL DATA:  Shortness of breath and chest pain for 2 days  EXAM: CT CHEST WITHOUT CONTRAST  TECHNIQUE: Multidetector CT imaging of the chest was performed following the standard protocol without IV contrast.  COMPARISON:  The chest radiograph 06/10/2015  FINDINGS: Mediastinum/Nodes: Thyroid is grossly unremarkable. Small AP window lymph node measures 1 cm image 19. Trace fluid within the superior pericardial recess. Probable LAD stent. Heart size upper limits of normal. No pericardial effusion.  Lungs/Pleura: Dependent trace pleural effusions with associated compressive atelectasis. Superior segment right lower lobe pulmonary nodule measures 0.6 cm, image 30. Central airways are patent.  Upper abdomen: Normal  Musculoskeletal: No acute osseous abnormality.  IMPRESSION: Trace pleural effusions with associated compressive atelectasis.  6 mm right lower lobe pulmonary nodule. If the patient is at high risk for  bronchogenic carcinoma, follow-up chest CT at 6-12 months is recommended. If the patient is at low risk for bronchogenic carcinoma, follow-up chest CT at 12 months is recommended. This recommendation follows the consensus statement: Guidelines for Management of Small Pulmonary Nodules Detected on CT Scans: A Statement from the [place] as published in Radiology 2005;237:395-400.   Electronically Signed   By: Conchita Paris M.D.   On: 06/12/2015 13:29    Labs:  Basic Metabolic Panel:  Recent Labs Lab 06/27/15 1038 06/29/15 0615  NA 136  --   K 4.9  --   CL 105  --   CO2 24  --   GLUCOSE 90  --   BUN 12  --   CREATININE 1.20 1.31*  CALCIUM 8.7*  --     CBC: CBC Latest Ref Rng 06/23/2015 06/22/2015 06/21/2015  WBC 4.0 - 10.5 K/uL 11.1(H) 10.4 11.1(H)  Hemoglobin 13.0 - 17.0 g/dL 10.6(L) 10.8(L) 11.3(L)  Hematocrit 39.0 - 52.0 % 34.0(L) 34.7(L) 35.4(L)  Platelets 150 - 400 K/uL 417(H) 378 330     CBG: No results for input(s): GLUCAP in the last 168 hours.  Brief HPI:   Sergio Griffin is a 64 y.o. male with history of HTN, diverticulosis, morbid obesity, recent prostatitis, CAD who was admitted via OSH on 06/10/15 with abdominal pain and diarrhea due to colitis as well as CP due to NSTEMI. He was started on IV heparin and IVF for acute on chronic renal failure with lactic acidosis. 2D echo done revealing moderate LVH with EF 60%, aortic sclerosis without stenosis and no pericardial effusion. He has had sleep wake disruption with  hypersomnia. He developed bloody stools and Dr. Fuller Plan questioned IBS v/s infectious v/s ischemic colitis. CTA showed evidence of progressive changes of colitis in distal transverse and descending segments. Colitis felt to be ischemic and IV heparin d/c and patient placed on bowel rest with improvement in symptoms. He had mild elevation in cardiac enzymes that was felt to be due to demand ischemia due to sepsis. DOE due to pulmonary edema has improved with  gentle diuresis. Therapy initiated and patient was noted to be deconditioned therefore CIR was recommended for follow up therapy   Hospital Course: Sergio Griffin was admitted to rehab 06/22/2015 for inpatient therapies to consist of PT, ST and OT at least three hours five days a week. Past admission physiatrist, therapy team and rehab RN have worked together to provide customized collaborative inpatient rehab.  Blood pressures were monitored on bid and have been reasonably controlled. No complaints of CP or SOB reported with increase in activity.  Weight were monitored daily and have been stable at 135.6 kg.   Peripheral edema has improved greatly with use of TEDs and low dose lasix. Follow up labs showed that renal status was stable and H/H is at baseline. GI symptoms have resolved and po intake has been good. He was started on Senna at bedtime to help with constipation.  Mood has been stable and no signs of anxiety noted. He has made steady progress during his rehab stay and is modified independent at discharge. He has been educated on HEP to help increase strength and endurance.     Rehab course: During patient's stay in rehab weekly team conferences were held to monitor patient's progress, set goals and discuss barriers to discharge. At admission, patient required min assist with ADL tasks as well as mobility. He has had improvement in activity tolerance, balance, postural control, as well as ability to compensate for deficits. He is able to complete ADL tasks independently. He is modified independent for transfers and ambulation with use of rollater. No family education needed due to modified independent level and as patient lives alone.     Disposition: Home  Diet: Heart Healthy  Special Instructions: 1. Needs sleep study for evaluation of sleep apnea. 2. Needs have follow up Chest CT in 6-12 months to monitor lung nodule.  3. Recheck CBC and BMET on post hospital follow up in 2 weeks.      Medication List    STOP taking these medications        ibuprofen 200 MG tablet  Commonly known as:  ADVIL,MOTRIN     losartan 50 MG tablet  Commonly known as:  COZAAR     metoprolol succinate 25 MG 24 hr tablet  Commonly known as:  TOPROL-XL     sucralfate 1 G tablet  Commonly known as:  CARAFATE     vitamin A 10000 UNIT capsule     Vitamin D (Cholecalciferol) 1000 UNITS Tabs      TAKE these medications        aspirin EC 81 MG tablet  Take 162 mg by mouth daily.     buPROPion 300 MG 24 hr tablet  Commonly known as:  WELLBUTRIN XL  Take 300 mg by mouth at bedtime.     clonazePAM 0.5 MG tablet  Commonly known as:  KLONOPIN  Take 0.5 mg by mouth 2 (two) times daily.     Fish Oil 1000 MG Caps  Take 1,000 mg by mouth daily.     furosemide 20  MG tablet  Commonly known as:  LASIX  Take 0.5 tablets (10 mg total) by mouth daily.     latanoprost 0.005 % ophthalmic solution  Commonly known as:  XALATAN  Place 1 drop into both eyes at bedtime.     metoprolol tartrate 25 MG tablet  Commonly known as:  LOPRESSOR  Take 0.5 tablets (12.5 mg total) by mouth 2 (two) times daily.     nitroGLYCERIN 0.4 MG SL tablet  Commonly known as:  NITROSTAT  Place 1 tablet (0.4 mg total) under the tongue every 5 (five) minutes x 3 doses as needed for chest pain.     pantoprazole 40 MG tablet  Commonly known as:  PROTONIX  Take 1 tablet (40 mg total) by mouth 2 (two) times daily.     promethazine 25 MG tablet  Commonly known as:  PHENERGAN  Take 25 mg by mouth every 6 (six) hours as needed for nausea or vomiting.     rosuvastatin 20 MG tablet  Commonly known as:  CRESTOR  Take 1 tablet (20 mg total) by mouth every evening.     senna-docusate 8.6-50 MG per tablet  Commonly known as:  Senokot-S  Take 2 tablets by mouth at bedtime.        Follow-up Information    Call Charlett Blake, MD.   Specialty:  Physical Medicine and Rehabilitation   Why:  As needed   Contact  information:   [place] [address] [phone]       Follow up with Dola Argyle, MD. Call today.   Specialty:  Cardiology   Why:  for cardiac workup/Or may follow up with your cardiologist in [facility] information:   [place] [address] [phone]       Follow up with Norberto Sorenson T. Fuller Plan, MD. Call on 08/04/2015.   Specialty:  Gastroenterology   Why:  appointment at 9:15 am for follow up on colitis.   Contact information:   [address] [place] [address] [phone]       Follow up with Mauricio Po, [place] On 07/12/2015.   Specialty:  Family Medicine   Why:  APPT @ 2;00 PM   Contact information:   [place] [address] [phone]       Signed: Bary Leriche 06/30/2015, 2:06 PM

## 2015-07-12 ENCOUNTER — Ambulatory Visit: Payer: Medicare Other | Admitting: Family

## 2015-07-20 ENCOUNTER — Encounter: Payer: Self-pay | Admitting: Family

## 2015-07-20 ENCOUNTER — Ambulatory Visit (INDEPENDENT_AMBULATORY_CARE_PROVIDER_SITE_OTHER): Payer: Medicare Other | Admitting: Family

## 2015-07-20 ENCOUNTER — Other Ambulatory Visit (INDEPENDENT_AMBULATORY_CARE_PROVIDER_SITE_OTHER): Payer: Medicare Other

## 2015-07-20 VITALS — BP 148/88 | HR 78 | Temp 98.3°F | Resp 18 | Ht 71.0 in | Wt 290.0 lb

## 2015-07-20 DIAGNOSIS — I10 Essential (primary) hypertension: Secondary | ICD-10-CM

## 2015-07-20 DIAGNOSIS — N179 Acute kidney failure, unspecified: Secondary | ICD-10-CM

## 2015-07-20 DIAGNOSIS — N189 Chronic kidney disease, unspecified: Secondary | ICD-10-CM

## 2015-07-20 DIAGNOSIS — R911 Solitary pulmonary nodule: Secondary | ICD-10-CM

## 2015-07-20 DIAGNOSIS — I2510 Atherosclerotic heart disease of native coronary artery without angina pectoris: Secondary | ICD-10-CM

## 2015-07-20 LAB — BASIC METABOLIC PANEL
BUN: 15 mg/dL (ref 6–23)
CHLORIDE: 104 meq/L (ref 96–112)
CO2: 29 meq/L (ref 19–32)
CREATININE: 1.19 mg/dL (ref 0.40–1.50)
Calcium: 9.5 mg/dL (ref 8.4–10.5)
GFR: 65.11 mL/min (ref >60.00)
Glucose, Bld: 89 mg/dL (ref 70–99)
Potassium: 3.9 mEq/L (ref 3.5–5.1)
Sodium: 140 mEq/L (ref 135–145)

## 2015-07-20 LAB — CBC
HCT: 39.7 % (ref 39.0–52.0)
Hemoglobin: 12.8 g/dL — ABNORMAL LOW (ref 13.0–17.0)
MCHC: 32.2 g/dL (ref 30.0–36.0)
MCV: 86.7 fl (ref 78.0–100.0)
PLATELETS: 237 10*3/uL (ref 150.0–400.0)
RBC: 4.58 Mil/uL (ref 4.22–5.81)
RDW: 15.2 % (ref 11.5–15.5)
WBC: 11.1 10*3/uL — ABNORMAL HIGH (ref 4.0–10.5)

## 2015-07-20 NOTE — Assessment & Plan Note (Signed)
Coronary artery disease appears stable with no evidence of new onset chest pain, although does express some potential symptoms related to possible angina. Follow-up with cardiology as scheduled.

## 2015-07-20 NOTE — Assessment & Plan Note (Signed)
Hypertension remains slightly elevated above goal for 140/90. Continue current dosage of furosemide and metoprolol. Continue to monitor blood pressure at home. Follow-up in 3 weeks for nurse visit to recheck blood pressure.

## 2015-07-20 NOTE — Assessment & Plan Note (Signed)
Symptoms appear to be improved since leaving the hospital. Obtain basic metabolic panel to recheck kidney function. Continue to drink plenty of fluids. Follow-up pending lab work.

## 2015-07-20 NOTE — Progress Notes (Signed)
Pre visit review using our clinic review tool, if applicable. No additional management support is needed unless otherwise documented below in the visit note. 

## 2015-07-20 NOTE — Progress Notes (Signed)
Subjective:    Patient ID: Sergio [facility], male    DOB: [birth year], 64 y.o.   MRN: [id number]  Chief Complaint  Patient presents with  . [facility] follow up    HPI:  Sergio Griffin is a 64 y.o. male with a PMH of non-ST elevated myocardial infarction, coronary artery disease, hypertension, pulmonary edema, colitis, acute on chronic kidney failure, pulmonary nodule, and physical debility who presents today for an office visit to establish care and posthospitalization follow-up.   Recently seen in the emergency room and admitted to the hospital for diffuse abdominal distention, dry heaving, and constipation. He was also noted to have new onset shortness of breath. He was admitted to the hospital with abdominal pain and diarrhea secondary to colitis as well as chest pain secondary to a non-ST elevated myocardial infarction. 2-D echo showed left ventricular hypertrophy with an ejection fraction of approximately 60%, aortic sclerosis without stenosis and no pericardial effusion. Infectious colitis stool cultures were negative and he improved with antibiotic treatment. Chest pain was related to possibly demand ischemia secondary to the stress of infection and is scheduled to follow-up with cardiology. He was noted to have a 6 mm pulmonary nodule on x-ray. Recommendation is to follow-up in 6 months for CT scan. Pulmonary edema stable upon discharge. Hospitalist recommendations include recheck of CBC and basic metabolic panel and follow-up chest CT scan in 6-12 months. Upon discharge from the hospital he was admitted to rehabilitation where he was discharged with improvement in his activity tolerance, balance, and postural control. All hospital records were reviewed in detail.   1.) NSTEMI vs demand ischemia - Notes that since leaving the hospital he has not had any problems. Will have the occasional shortness of breath with extreme exertion that is relieved by rest. Does note there  was one incident of chest pain that he atrributes to stress. Does have a cardiology follow up.   2.) Hypertension - Currently maintain on metoprolol and furosemide. Takes the medication as prescribed and denies any adverse side effects. Does not currently take his blood pressure at home. Currently being followed closely for glaucoma.   3.) Acute kidney failure - Noted to have a creatinine of 1.31 on average throughout hospitalization. Did have blood loss in the hospital which may have contributed to acute renal failure. Since leaving the hospital he has not had any issues with urination or pain.   Overall function has improved since leaving the hospital and is able to complete his activities of daily living with minimal difficulty. Has been pacing himself and makes appropriate adjustments including using a bar stool when cooking secondary to inability to stand for long periods of time.   Allergies  Allergen Reactions  . Sulfa Antibiotics Itching     Outpatient Prescriptions Prior to Visit  Medication Sig Dispense Refill  . aspirin EC 81 MG tablet Take 162 mg by mouth daily.    Marland Kitchen buPROPion (WELLBUTRIN XL) 300 MG 24 hr tablet Take 300 mg by mouth at bedtime.    . clonazePAM (KLONOPIN) 0.5 MG tablet Take 0.5 mg by mouth 2 (two) times daily.    . furosemide (LASIX) 20 MG tablet Take 0.5 tablets (10 mg total) by mouth daily. 30 tablet 1  . latanoprost (XALATAN) 0.005 % ophthalmic solution Place 1 drop into both eyes at bedtime.    . metoprolol tartrate (LOPRESSOR) 25 MG tablet Take 0.5 tablets (12.5 mg total) by mouth 2 (two) times daily.  30 tablet 1  . nitroGLYCERIN (NITROSTAT) 0.4 MG SL tablet Place 1 tablet (0.4 mg total) under the tongue every 5 (five) minutes x 3 doses as needed for chest pain. 30 tablet 1  . Omega-3 Fatty Acids (FISH OIL) 1000 MG CAPS Take 1,000 mg by mouth daily.    . pantoprazole (PROTONIX) 40 MG tablet Take 1 tablet (40 mg total) by mouth 2 (two) times daily. 60 tablet 1    . promethazine (PHENERGAN) 25 MG tablet Take 25 mg by mouth every 6 (six) hours as needed for nausea or vomiting.    . rosuvastatin (CRESTOR) 20 MG tablet Take 1 tablet (20 mg total) by mouth every evening. 30 tablet 1  . senna-docusate (SENOKOT-S) 8.6-50 MG per tablet Take 2 tablets by mouth at bedtime. 60 tablet 1   No facility-administered medications prior to visit.     Past Medical History  Diagnosis Date  . Hypertension   . Reflux   . Glaucoma   . Hyperlipidemia   . CAD (coronary artery disease)     PCI, stent 2011  . Diverticulosis   . Gastric ulcer   . Prostatitis   . Pneumonia 11/2014  . Depression   . Chicken pox   . GERD (gastroesophageal reflux disease)   . Kidney stones   . UTI (lower urinary tract infection)      Past Surgical History  Procedure Laterality Date  . Cardiac stents    . Eye surgery      Family History  Problem Relation Age of Onset  . Arthritis Mother   . Hyperlipidemia Mother   . Stroke Mother   . Hypertension Mother   . Hyperlipidemia Father   . Heart disease Father   . Hypertension Father   . Non-Hodgkin's lymphoma Father   . Hyperlipidemia Maternal Grandmother   . Heart disease Maternal Grandmother   . Stroke Maternal Grandmother   . Hypertension Maternal Grandmother   . Diabetes Maternal Grandmother   . Hyperlipidemia Maternal Grandfather   . Heart disease Maternal Grandfather   . Hypertension Maternal Grandfather   . Heart disease Paternal Grandmother   . Heart disease Paternal Grandfather   . Stroke Paternal Grandfather   . Hypertension Paternal Grandfather      Social History   Social History  . Marital Status: Divorced    Spouse Name: N/A  . Number of Children: 2  . Years of Education: 16   Occupational History  . Medical Technologist     Retired   Social History Main Topics  . Smoking status: Former Smoker    Types: Cigarettes  . Smokeless tobacco: Never Used  . Alcohol Use: No  . Drug Use: No  .  Sexual Activity: Not on file   Other Topics Concern  . Not on file   Social History Narrative   Fun: Paint, cook, travel   Denies religious beliefs effecting health care.     Review of Systems  Constitutional: Negative for fever and chills.  Respiratory: Negative for chest tightness and shortness of breath.   Cardiovascular: Negative for chest pain, palpitations and leg swelling.  Neurological: Negative for weakness, light-headedness and headaches.      Objective:    BP 148/88 mmHg  Pulse 78  Temp(Src) 98.3 F (36.8 C) (Oral)  Resp 18  Ht 5\' 11"  (1.803 m)  Wt 290 lb (131.543 kg)  BMI 40.46 kg/m2  SpO2 96% Nursing note and vital signs reviewed.  Physical Exam  Constitutional: He is  oriented to person, place, and time. He appears well-developed and well-nourished. No distress.  Cardiovascular: Normal rate, regular rhythm, normal heart sounds and intact distal pulses.   Pulmonary/Chest: Effort normal and breath sounds normal. He has no wheezes. He has no rales.  Neurological: He is alert and oriented to person, place, and time.  Skin: Skin is warm and dry.  Psychiatric: He has a normal mood and affect. His behavior is normal. Judgment and thought content normal.       Assessment & Plan:   Problem List Items Addressed This Visit      Cardiovascular and Mediastinum   CAD in native artery - Primary    Coronary artery disease appears stable with no evidence of new onset chest pain, although does express some potential symptoms related to possible angina. Follow-up with cardiology as scheduled.      Essential hypertension    Hypertension remains slightly elevated above goal for 140/90. Continue current dosage of furosemide and metoprolol. Continue to monitor blood pressure at home. Follow-up in 3 weeks for nurse visit to recheck blood pressure.      Relevant Orders   Basic Metabolic Panel (BMET) (Completed)     Genitourinary   Acute on chronic renal failure     Symptoms appear to be improved since leaving the hospital. Obtain basic metabolic panel to recheck kidney function. Continue to drink plenty of fluids. Follow-up pending lab work.      Relevant Orders   CBC (Completed)   Basic Metabolic Panel (BMET) (Completed)     Other   Pulmonary nodule    No current symptoms or cough or shortness of breath related to the pulmonary nodule. Recommend follow-up CT in 6 months. Follow-up sooner if needed.

## 2015-07-20 NOTE — Assessment & Plan Note (Signed)
No current symptoms or cough or shortness of breath related to the pulmonary nodule. Recommend follow-up CT in 6 months. Follow-up sooner if needed.

## 2015-07-20 NOTE — Patient Instructions (Signed)
Thank you for choosing [facility].  Summary/Instructions:  Please take you medications as prescribed.  Follow up for a chest CT scan in 6 months (March).  Follow up with cardiology and gastroenterology.  Please stop by the lab on the basement level of the building for your blood work. Your results will be released to [place] (or called to you) after review, usually within 72 hours after test completion. If any changes need to be made, you will be notified at that same time.  If your symptoms worsen or fail to improve, please contact our office for further instruction, or in case of emergency go directly to the emergency room at the closest medical facility.

## 2015-07-26 ENCOUNTER — Telehealth: Payer: Self-pay | Admitting: Family

## 2015-07-26 NOTE — Telephone Encounter (Signed)
Please inform patient that his blood work is improved and close to the normal expected ranges for post-hospitalization. Follow up as planned for blood pressure.

## 2015-07-27 ENCOUNTER — Telehealth: Payer: Self-pay | Admitting: Gastroenterology

## 2015-07-27 NOTE — Telephone Encounter (Signed)
LVM for pt to call back.

## 2015-07-27 NOTE — Telephone Encounter (Signed)
Received records from [facility] forwarded 9 pages to Dr. Lucio Edward 07/27/15 fbg.

## 2015-08-01 NOTE — Telephone Encounter (Signed)
Pt aware of results 

## 2015-08-04 ENCOUNTER — Ambulatory Visit: Payer: Medicare Other | Admitting: Gastroenterology

## 2015-08-24 ENCOUNTER — Other Ambulatory Visit: Payer: Self-pay | Admitting: Physical Medicine and Rehabilitation

## 2015-08-25 ENCOUNTER — Other Ambulatory Visit: Payer: Self-pay | Admitting: Physical Medicine and Rehabilitation

## 2015-08-29 ENCOUNTER — Other Ambulatory Visit: Payer: Self-pay | Admitting: Cardiovascular Disease

## 2015-08-29 NOTE — Telephone Encounter (Signed)
Pt requesting a refill on metoprolol tartrate 25 mg, [facility] pt, appt is on 09/15/15 at 2:30 pm, would you like to refill this medication. Please advise. Thanks

## 2015-08-30 MED ORDER — METOPROLOL TARTRATE 25 MG PO TABS: 12.5000 mg | ORAL_TABLET | Freq: Two times a day (BID) | ORAL | Status: DC

## 2015-09-15 ENCOUNTER — Encounter: Payer: Self-pay | Admitting: Cardiovascular Disease

## 2015-09-15 ENCOUNTER — Ambulatory Visit (INDEPENDENT_AMBULATORY_CARE_PROVIDER_SITE_OTHER): Payer: Medicare Other | Admitting: Cardiovascular Disease

## 2015-09-15 VITALS — BP 130/90 | HR 63 | Ht 71.0 in | Wt 296.8 lb

## 2015-09-15 DIAGNOSIS — I2510 Atherosclerotic heart disease of native coronary artery without angina pectoris: Secondary | ICD-10-CM | POA: Diagnosis not present

## 2015-09-15 MED ORDER — METOPROLOL TARTRATE 25 MG PO TABS: 12.5000 mg | ORAL_TABLET | Freq: Two times a day (BID) | ORAL | Status: DC

## 2015-09-15 MED ORDER — ROSUVASTATIN CALCIUM 20 MG PO TABS: 20.0000 mg | ORAL_TABLET | Freq: Every evening | ORAL | Status: DC

## 2015-09-15 MED ORDER — FUROSEMIDE 20 MG PO TABS: 10.0000 mg | ORAL_TABLET | Freq: Every day | ORAL | Status: DC

## 2015-09-15 NOTE — Progress Notes (Signed)
Cardiology Office Note   Date:  09/15/2015   ID:  Sergio [facility], DOB 06-15-1951, MRN [id number]  PCP:  Mauricio Po, FNP  Cardiologist:   Thayer Headings, MD   Chief Complaint  Patient presents with  . Chest Pain   1. CAD  - stent 2011. ( Prox and mid LAD )  2. Hyperlipidemia 3. GI bleed     History of Present Illness: Sergio [facility] is a 64 y.o. male who presents for follow up of a previous hospitalization  Originally [facility] bleed. During his evaluation he was found have some EKG abnormalities and was transferred to [facility].  He was admitted .  Did not have a cath because of renal insufficiency  Echo shows normal LV function  - Left ventricle: Technically difficult study. The cavity size was normal. Wall thickness was increased in a pattern of moderate LVH. The estimated ejection fraction was 60%. Regional wall motion abnormalities cannot be excluded. - Aortic valve: Sclerosis without stenosis. There was no significant regurgitation. - Right ventricle: The cavity size was normal. Systolic function was normal.  Previously had lots of leg edema.  Takes Lasix daily - 1/2 tab.  Occasionally has a whole tablet.   BP at home is very well controlled.  130s/ 70s   Past Medical History  Diagnosis Date  . Hypertension   . Reflux   . Glaucoma   . Hyperlipidemia   . CAD (coronary artery disease)     PCI, stent 2011  . Diverticulosis   . Gastric ulcer   . Prostatitis   . Pneumonia 11/2014  . Depression   . Chicken pox   . GERD (gastroesophageal reflux disease)   . Kidney stones   . UTI (lower urinary tract infection)     Past Surgical History  Procedure Laterality Date  . Cardiac stents    . Eye surgery       Current Outpatient Prescriptions  Medication Sig Dispense Refill  . aspirin EC 81 MG tablet Take 162 mg by mouth daily.    Marland Kitchen buPROPion (WELLBUTRIN XL) 300 MG 24 hr tablet Take 300 mg by  mouth at bedtime.    . clonazePAM (KLONOPIN) 0.5 MG tablet Take 0.5 mg by mouth 2 (two) times daily.    . furosemide (LASIX) 20 MG tablet Take 0.5 tablets (10 mg total) by mouth daily. 30 tablet 1  . latanoprost (XALATAN) 0.005 % ophthalmic solution Place 1 drop into both eyes at bedtime.    . metoprolol tartrate (LOPRESSOR) 25 MG tablet Take 0.5 tablets (12.5 mg total) by mouth 2 (two) times daily. 30 tablet 0  . nitroGLYCERIN (NITROSTAT) 0.4 MG SL tablet Place 1 tablet (0.4 mg total) under the tongue every 5 (five) minutes x 3 doses as needed for chest pain. 30 tablet 1  . Omega-3 Fatty Acids (FISH OIL) 1000 MG CAPS Take 1,000 mg by mouth daily.    . rosuvastatin (CRESTOR) 20 MG tablet Take 1 tablet (20 mg total) by mouth every evening. 30 tablet 1  . senna-docusate (SENOKOT-S) 8.6-50 MG per tablet Take 2 tablets by mouth at bedtime. 60 tablet 1   No current facility-administered medications for this visit.    Allergies:   Sulfa antibiotics    Social History:  The patient  reports that he has quit smoking. His smoking use included Cigarettes. He has never used smokeless tobacco. He reports that he does not drink alcohol  or use illicit drugs.   Family History:  The patient's family history includes Arthritis in his mother; Diabetes in his maternal grandmother; Heart disease in his father, maternal grandfather, maternal grandmother, paternal grandfather, and paternal grandmother; Hyperlipidemia in his father, maternal grandfather, maternal grandmother, and mother; Hypertension in his father, maternal grandfather, maternal grandmother, mother, and paternal grandfather; Non-Hodgkin's lymphoma in his father; Stroke in his maternal grandmother, mother, and paternal grandfather.    ROS:  Please see the history of present illness.    Review of Systems: Constitutional:  denies fever, chills, diaphoresis, appetite change and fatigue.  HEENT: denies photophobia, eye pain, redness, hearing loss, ear  pain, congestion, sore throat, rhinorrhea, sneezing, neck pain, neck stiffness and tinnitus.  Respiratory: denies SOB, DOE, cough, chest tightness, and wheezing.  Cardiovascular: denies chest pain, palpitations and leg swelling.  Gastrointestinal: denies nausea, vomiting, abdominal pain, diarrhea, constipation, blood in stool.  Genitourinary: denies dysuria, urgency, frequency, hematuria, flank pain and difficulty urinating.  Musculoskeletal: denies  myalgias, back pain, joint swelling, arthralgias and gait problem.   Skin: denies pallor, rash and wound.  Neurological: denies dizziness, seizures, syncope, weakness, light-headedness, numbness and headaches.   Hematological: denies adenopathy, easy bruising, personal or family bleeding history.  Psychiatric/ Behavioral: denies suicidal ideation, mood changes, confusion, nervousness, sleep disturbance and agitation.       All other systems are reviewed and negative.    PHYSICAL EXAM: VS:  BP 130/90 mmHg  Pulse 63  Ht 5\' 11"  (1.803 m)  Wt 296 lb 12.8 oz (134.628 kg)  BMI 41.41 kg/m2  SpO2 95% , BMI Body mass index is 41.41 kg/(m^2). GEN: Well nourished, well developed, in no acute distress HEENT: normal Neck: no JVD, carotid bruits, or masses Cardiac: RRR; no murmurs, rubs, or gallops, trace  edema , compression hose on  Respiratory:  clear to auscultation bilaterally, normal work of breathing GI: soft, nontender, nondistended, + BS MS: no deformity or atrophy Skin: warm and dry, no rash Neuro:  Strength and sensation are intact Psych: normal   EKG:  EKG is not ordered today.   Recent Labs: 06/10/2015: B Natriuretic Peptide 16.0 06/22/2015: Magnesium 2.4 06/23/2015: ALT 18 07/20/2015: BUN 15; Creatinine, Ser 1.19; Hemoglobin 12.8*; Platelets 237.0; Potassium 3.9; Sodium 140    Lipid Panel    Component Value Date/Time   CHOL 114 06/11/2015 0247   TRIG 73 06/11/2015 0247   HDL 45 06/11/2015 0247   CHOLHDL 2.5 06/11/2015 0247     VLDL 15 06/11/2015 0247   LDLCALC 54 06/11/2015 0247      Wt Readings from Last 3 Encounters:  09/15/15 296 lb 12.8 oz (134.628 kg)  07/20/15 290 lb (131.543 kg)  06/30/15 298 lb 15.1 oz (135.6 kg)      Other studies Reviewed: Additional studies/ records that were reviewed today include: . Review of the above records demonstrates:     ASSESSMENT AND PLAN:  1.  Coronary artery disease: Sharad has a history of coronary artery disease. He was recently admitted to the hospital with a GI bleed and was thought to have unstable angina. His cardiac enzymes were negative. He has not required a cardiac catheter station. He's now pain-free. We will continue to follow him.  2. Chronic leg edema: He's had leg edema for many years. He seems to be well-controlled on the low-dose Lasix-10 mg. Continue to wear compression hose. Gave him instruction on leg elevation ( including website for the [facility] Doctor leg rest )  ambulation, compression hose.  Current medicines are reviewed at length with the patient today.  The patient does not have concerns regarding medicines.  The following changes have been made:  no change  Labs/ tests ordered today include:  No orders of the defined types were placed in this encounter.    Disposition:   FU with me in 1 year     Tema Alire, Wonda Cheng, MD  09/15/2015 3:10 PM    [facility] [place], [place], [place]  [address] Phone: [phone]; Fax: [phone]   [facility]  [address] [place], [place]  [address] [phone]   Fax [phone]

## 2015-09-15 NOTE — Patient Instructions (Addendum)
Medication Instructions:  Your physician recommends that you continue on your current medications as directed. Please refer to the Current Medication list given to you today.   Labwork: Your physician recommends that you return for lab work in: 1 year on the day of or a few days before your office visit with Dr. Acie Fredrickson.  You will need to FAST for this appointment - nothing to eat or drink after midnight the night before except water.    Testing/Procedures: None Ordered   Follow-Up: Your physician wants you to follow-up in: 1 year with Dr. Acie Fredrickson.  You will receive a reminder letter in the mail two months in advance. If you don't receive a letter, please call our office to schedule the follow-up appointment.   If you need a refill on your cardiac medications before your next appointment, please call your pharmacy.   For your  leg edema you  should do  the following 1. Leg elevation - I recommend the Lounge Dr. Leg rest.  See below for details  2. Salt restriction  -  Use potassium chloride instead of regular salt as a salt substitute. 3. Walk regularly 4. Compression hose - [facility] 5. Weight loss     Go to [email or web address]       Thank you for choosing [facility]! Christen Bame, RN [phone]

## 2015-09-19 ENCOUNTER — Ambulatory Visit: Payer: Medicare Other | Admitting: Gastroenterology

## 2015-11-01 ENCOUNTER — Telehealth: Payer: Self-pay | Admitting: Family

## 2015-11-01 MED ORDER — BUPROPION HCL ER (XL) 300 MG PO TB24: 300.0000 mg | ORAL_TABLET | Freq: Every day | ORAL | Status: DC

## 2015-11-01 NOTE — Telephone Encounter (Signed)
Medication sent to pharmacy. Additional refills will need OV.

## 2015-11-01 NOTE — Telephone Encounter (Signed)
Patient called to advise that his psychiatrist is no longer practicing. He asks that we fill buPROPion (WELLBUTRIN XL) 300 MG 24 hr tablet [id number] for him while he finds a new psychiatrist. He states that there is no way that he can come in for an ov before xmas.

## 2015-11-02 NOTE — Telephone Encounter (Signed)
Called and advised.

## 2015-11-16 ENCOUNTER — Encounter: Payer: Self-pay | Admitting: Family

## 2015-11-16 ENCOUNTER — Ambulatory Visit (INDEPENDENT_AMBULATORY_CARE_PROVIDER_SITE_OTHER): Payer: Medicare Other | Admitting: Family

## 2015-11-16 ENCOUNTER — Other Ambulatory Visit: Payer: Medicare Other

## 2015-11-16 VITALS — BP 142/92 | HR 64 | Temp 97.8°F | Resp 18 | Ht 71.0 in | Wt 314.0 lb

## 2015-11-16 DIAGNOSIS — R350 Frequency of micturition: Secondary | ICD-10-CM | POA: Insufficient documentation

## 2015-11-16 DIAGNOSIS — F32A Depression, unspecified: Secondary | ICD-10-CM

## 2015-11-16 DIAGNOSIS — F329 Major depressive disorder, single episode, unspecified: Secondary | ICD-10-CM | POA: Insufficient documentation

## 2015-11-16 DIAGNOSIS — F418 Other specified anxiety disorders: Secondary | ICD-10-CM | POA: Diagnosis not present

## 2015-11-16 DIAGNOSIS — Z23 Encounter for immunization: Secondary | ICD-10-CM | POA: Diagnosis not present

## 2015-11-16 DIAGNOSIS — F419 Anxiety disorder, unspecified: Principal | ICD-10-CM

## 2015-11-16 DIAGNOSIS — I2510 Atherosclerotic heart disease of native coronary artery without angina pectoris: Secondary | ICD-10-CM

## 2015-11-16 LAB — POCT URINALYSIS DIPSTICK
Bilirubin, UA: NEGATIVE
Blood, UA: NEGATIVE
GLUCOSE UA: NEGATIVE
Ketones, UA: NEGATIVE
Leukocytes, UA: NEGATIVE
NITRITE UA: NEGATIVE
Protein, UA: NEGATIVE
Spec Grav, UA: 1.015
UROBILINOGEN UA: NEGATIVE
pH, UA: 6

## 2015-11-16 MED ORDER — ALFUZOSIN HCL ER 10 MG PO TB24: 10.0000 mg | ORAL_TABLET | Freq: Every day | ORAL | Status: DC

## 2015-11-16 MED ORDER — CLONAZEPAM 0.5 MG PO TABS: 0.5000 mg | ORAL_TABLET | Freq: Two times a day (BID) | ORAL | Status: DC | PRN

## 2015-11-16 NOTE — Assessment & Plan Note (Signed)
Anxiety and depression appear well controlled with current regimen and denies adverse side effects. Denies suicidal ideation. Continue current dosage of Wellbutrin and clonazepam. Follow-up in 6 months or sooner if symptoms are no longer controlled.

## 2015-11-16 NOTE — Assessment & Plan Note (Addendum)
Urinary frequency of questionable origin. Prostate exam is normal with no evidence of tenderness and no symptoms fever or chills that would support prostatitis. In office urinalysis negative for nitrites, leukocytes, or hematuria. Urine will be sent for culture. Symptoms most likely related to benign prostate hypertrophy. Start Uroxatral. Follow-up if symptoms worsen or fail to improve.

## 2015-11-16 NOTE — Progress Notes (Signed)
Subjective:    Patient ID: Sergio Griffin, male    DOB: [birth year], 64 y.o.   MRN: [id number]  Chief Complaint  Patient presents with  . Urinary Frequency    states that he is having sxs of having to urinate frequently but not being able to empty his bladder, he has had an inflammed prostate before and says this feels similar, wants to see if you will prescribe his klonopin and wellbutrin    HPI:  Sergio Griffin is a 64 y.o. male who  has a past medical history of Hypertension; Reflux; Glaucoma; Hyperlipidemia; CAD (coronary artery disease); Diverticulosis; Gastric ulcer; Prostatitis; Pneumonia (11/2014); Depression; Chicken pox; GERD (gastroesophageal reflux disease); Kidney stones; and UTI (lower urinary tract infection). and presents today for a follow-up office visit.  1.) Urinary issues - This is a new problem. Associated symptom of urinary frequency has been going on for about a week. Describes the feeling as if he does not completely empty his bladder. Also notes discomfort in the lower abdomen. Denies fevers, dysuria, penile pain or urgency. No flank pain. Modifying factors include drinking a little more water which has not helped very much. Frequency of urination is about    2.) Depression and anxiety - currently maintained on Wellbutrin and Klonopin and reports his mood is stable. Takes medication as prescribed and denies adverse side effects. Klonopin is used twice per day and has cut back to just as bed time because of improved control of his panic attacks and aggravating factors.    Allergies  Allergen Reactions  . Sulfa Antibiotics Itching     Current Outpatient Prescriptions on File Prior to Visit  Medication Sig Dispense Refill  . aspirin EC 81 MG tablet Take 162 mg by mouth daily.    Marland Kitchen buPROPion (WELLBUTRIN XL) 300 MG 24 hr tablet Take 1 tablet (300 mg total) by mouth at bedtime. 90 tablet 0  . furosemide (LASIX) 20 MG tablet Take 0.5 tablets (10 mg total) by mouth  daily. 45 tablet 3  . latanoprost (XALATAN) 0.005 % ophthalmic solution Place 1 drop into both eyes at bedtime.    . metoprolol tartrate (LOPRESSOR) 25 MG tablet Take 0.5 tablets (12.5 mg total) by mouth 2 (two) times daily. 45 tablet 3  . nitroGLYCERIN (NITROSTAT) 0.4 MG SL tablet Place 1 tablet (0.4 mg total) under the tongue every 5 (five) minutes x 3 doses as needed for chest pain. 30 tablet 1  . Omega-3 Fatty Acids (FISH OIL) 1000 MG CAPS Take 1,000 mg by mouth daily.    . rosuvastatin (CRESTOR) 20 MG tablet Take 1 tablet (20 mg total) by mouth every evening. 90 tablet 3  . senna-docusate (SENOKOT-S) 8.6-50 MG per tablet Take 2 tablets by mouth at bedtime. 60 tablet 1   No current facility-administered medications on file prior to visit.    Past Medical History  Diagnosis Date  . Hypertension   . Reflux   . Glaucoma   . Hyperlipidemia   . CAD (coronary artery disease)     PCI, stent 2011  . Diverticulosis   . Gastric ulcer   . Prostatitis   . Pneumonia 11/2014  . Depression   . Chicken pox   . GERD (gastroesophageal reflux disease)   . Kidney stones   . UTI (lower urinary tract infection)     Review of Systems  Constitutional: Negative for fever and chills.  Respiratory: Negative for chest tightness and shortness of breath.   Genitourinary: Positive  for frequency.      Objective:    BP 142/92 mmHg  Pulse 64  Temp(Src) 97.8 F (36.6 C) (Oral)  Resp 18  Ht 5\' 11"  (1.803 m)  Wt 314 lb (142.429 kg)  BMI 43.81 kg/m2  SpO2 97% Nursing note and vital signs reviewed.  Physical Exam  Constitutional: He is oriented to person, place, and time. He appears well-developed and well-nourished. No distress.  Cardiovascular: Normal rate, regular rhythm, normal heart sounds and intact distal pulses.   Pulmonary/Chest: Effort normal and breath sounds normal.  Genitourinary: Rectal exam shows external hemorrhoid. Rectal exam shows no internal hemorrhoid, no fissure, no mass and no  tenderness. Prostate is not enlarged and not tender.  Neurological: He is alert and oriented to person, place, and time.  Skin: Skin is warm and dry.  Psychiatric: He has a normal mood and affect. His behavior is normal. Judgment and thought content normal.       Assessment & Plan:   Problem List Items Addressed This Visit      Other   Urinary frequency    Urinary frequency of questionable origin. Prostate exam is normal with no evidence of tenderness and no symptoms fever or chills that would support prostatitis. In office urinalysis negative for nitrites, leukocytes, or hematuria. Urine will be sent for culture. Symptoms most likely related to benign prostate hypertrophy. Start Uroxatral. Follow-up if symptoms worsen or fail to improve.      Relevant Orders   POCT urinalysis dipstick (Completed)   Urine culture   Anxiety and depression - Primary    Anxiety and depression appear well controlled with current regimen and denies adverse side effects. Denies suicidal ideation. Continue current dosage of Wellbutrin and clonazepam. Follow-up in 6 months or sooner if symptoms are no longer controlled.      Relevant Medications   clonazePAM (KLONOPIN) 0.5 MG tablet    Other Visit Diagnoses    Need for diphtheria-tetanus-pertussis (Tdap) vaccine, adult/adolescent        Relevant Orders    Tdap vaccine greater than or equal to 7yo IM (Completed)

## 2015-11-16 NOTE — Progress Notes (Signed)
Pre visit review using our clinic review tool, if applicable. No additional management support is needed unless otherwise documented below in the visit note. 

## 2015-11-16 NOTE — Patient Instructions (Signed)
Thank you for choosing [facility].  Summary/Instructions:  Your prescription(s) have been submitted to your pharmacy or been printed and provided for you. Please take as directed and contact our office if you believe you are having problem(s) with the medication(s) or have any questions.  If your symptoms worsen or fail to improve, please contact our office for further instruction, or in case of emergency go directly to the emergency room at the closest medical facility.   Urinary Frequency The number of times a normal person urinates depends upon how much liquid they take in and how much liquid they are losing. If the temperature is hot and there is high humidity, then the person will sweat more and usually breathe a little more frequently. These factors decrease the amount of frequency of urination that would be considered normal. The amount you drink is easily determined, but the amount of fluid lost is sometimes more difficult to calculate.  Fluid is lost in two ways:  Sensible fluid loss is usually measured by the amount of urine that you get rid of. Losses of fluid can also occur with diarrhea.  Insensible fluid loss is more difficult to measure. It is caused by evaporation. Insensible loss of fluid occurs through breathing and sweating. It usually ranges from a little less than a quart to a little more than a quart of fluid a day. In normal temperatures and activity levels, the average person may urinate 4 to 7 times in a 24-hour period. Needing to urinate more often than that could indicate a problem. If one urinates 4 to 7 times in 24 hours and has large volumes each time, that could indicate a different problem from one who urinates 4 to 7 times a day and has small volumes. The time of urinating is also important. Most urinating should be done during the waking hours. Getting up at night to urinate frequently can indicate some problems. CAUSES  The bladder is the organ in your  lower abdomen that holds urine. Like a balloon, it swells some as it fills up. Your nerves sense this and tell you it is time to head for the bathroom. There are a number of reasons that you might feel the need to urinate more often than usual. They include:  Urinary tract infection. This is usually associated with other signs such as burning when you urinate.  In men, problems with the prostate (a walnut-size gland that is located near the tube that carries urine out of your body). There are two reasons why the prostate can cause an increased frequency of urination:  An enlarged prostate that does not let the bladder empty well. If the bladder only half empties when you urinate, then it only has half the capacity to fill before you have to urinate again.  The nerves in the bladder become more hypersensitive with an increased size of the prostate even if the bladder empties completely.  Pregnancy.  Obesity. Excess weight is more likely to cause a problem for women than for men.  Bladder stones or other bladder problems.  Caffeine.  Alcohol.  Medications. For example, drugs that help the body get rid of extra fluid (diuretics) increase urine production. Some other medicines must be taken with lots of fluids.  Muscle or nerve weakness. This might be the result of a spinal cord injury, a stroke, multiple sclerosis, or Parkinson disease.  Long-standing diabetes can decrease the sensation of the bladder. This loss of sensation makes it harder to sense  the bladder needs to be emptied. Over a period of years, the bladder is stretched out by constant overfilling. This weakens the bladder muscles so that the bladder does not empty well and has less capacity to fill with new urine.  Interstitial cystitis (also called painful bladder syndrome). This condition develops because the tissues that line the inside of the bladder are inflamed (inflammation is the body's way of reacting to injury or  infection). It causes pain and frequent urination. It occurs in women more often than in men. DIAGNOSIS   To decide what might be causing your urinary frequency, your health care provider will probably:  Ask about symptoms you have noticed.  Ask about your overall health. This will include questions about any medications you are taking.  Do a physical examination.  Order some tests. These might include:  A blood test to check for diabetes or other health issues that could be contributing to the problem.  Urine testing. This could measure the flow of urine and the pressure on the bladder.  A test of your neurological system (the brain, spinal cord, and nerves). This is the system that senses the need to urinate.  A bladder test to check whether it is emptying completely when you urinate.  Cystoscopy. This test uses a thin tube with a tiny camera on it. It offers a look inside your urethra and bladder to see if there are problems.  Imaging tests. You might be given a contrast dye and then asked to urinate. X-rays are taken to see how your bladder is working. TREATMENT  It is important for you to be evaluated to determine if the amount or frequency that you have is unusual or abnormal. If it is found to be abnormal, the cause should be determined and this can usually be found out easily. Depending upon the cause, treatment could include medication, stimulation of the nerves, or surgery. There are not too many things that you can do as an individual to change your urinary frequency. It is important that you balance the amount of fluid intake needed to compensate for your activity and the temperature. Medical problems will be diagnosed and taken care of by your physician. There is no particular bladder training such as Kegel exercises that you can do to help urinary frequency. This is an exercise that is usually recommended for people who have leaking of urine when they laugh, cough, or  sneeze. HOME CARE INSTRUCTIONS   Take any medications your health care provider prescribed or suggested. Follow the directions carefully.  Practice any lifestyle changes that are recommended. These might include:  Drinking less fluid or drinking at different times of the day. If you need to urinate often during the night, for example, you may need to stop drinking fluids early in the evening.  Cutting down on caffeine or alcohol. They both can make you need to urinate more often than normal. Caffeine is found in coffee, tea, and sodas.  Losing weight, if that is recommended.  Keep a journal or a log. You might be asked to record how much you drink and when and where you feel the need to urinate. This will also help evaluate how well the treatment provided by your physician is working. SEEK MEDICAL CARE IF:   Your need to urinate often gets worse.  You feel increased pain or irritation when you urinate.  You notice blood in your urine.  You have questions about any medications that your health care provider  recommended.  You notice blood, pus, or swelling at the site of any test or treatment procedure.  You develop a fever of more than 100.96F (38.1C). SEEK IMMEDIATE MEDICAL CARE IF:  You develop a fever of more than 102.47F (38.9C).   This information is not intended to replace advice given to you by your health care provider. Make sure you discuss any questions you have with your health care provider.   Document Released: 09/01/2009 Document Revised: 11/26/2014 Document Reviewed: 09/01/2009 Elsevier Interactive Patient Education [employer].

## 2015-11-17 LAB — URINE CULTURE
Colony Count: NO GROWTH
Organism ID, Bacteria: NO GROWTH

## 2015-11-18 ENCOUNTER — Encounter: Payer: Self-pay | Admitting: Family

## 2016-03-27 ENCOUNTER — Other Ambulatory Visit: Payer: Self-pay | Admitting: Cardiovascular Disease

## 2017-03-14 DIAGNOSIS — H401133 Primary open-angle glaucoma, bilateral, severe stage: Secondary | ICD-10-CM | POA: Diagnosis not present

## 2017-04-17 DIAGNOSIS — R0602 Shortness of breath: Secondary | ICD-10-CM | POA: Diagnosis not present

## 2017-04-17 DIAGNOSIS — R9431 Abnormal electrocardiogram [ECG] [EKG]: Secondary | ICD-10-CM | POA: Diagnosis not present

## 2017-04-17 DIAGNOSIS — R079 Chest pain, unspecified: Secondary | ICD-10-CM | POA: Diagnosis not present

## 2017-04-17 DIAGNOSIS — R002 Palpitations: Secondary | ICD-10-CM | POA: Diagnosis not present

## 2017-04-17 DIAGNOSIS — R0600 Dyspnea, unspecified: Secondary | ICD-10-CM | POA: Diagnosis not present

## 2017-04-24 ENCOUNTER — Encounter: Payer: Self-pay | Admitting: Gastroenterology

## 2017-04-30 DIAGNOSIS — R002 Palpitations: Secondary | ICD-10-CM | POA: Diagnosis not present

## 2017-05-16 DIAGNOSIS — I10 Essential (primary) hypertension: Secondary | ICD-10-CM | POA: Diagnosis not present

## 2017-05-16 DIAGNOSIS — R0789 Other chest pain: Secondary | ICD-10-CM | POA: Diagnosis not present

## 2017-05-16 DIAGNOSIS — K219 Gastro-esophageal reflux disease without esophagitis: Secondary | ICD-10-CM | POA: Diagnosis not present

## 2017-05-21 DIAGNOSIS — R1032 Left lower quadrant pain: Secondary | ICD-10-CM | POA: Diagnosis not present

## 2017-05-21 DIAGNOSIS — R197 Diarrhea, unspecified: Secondary | ICD-10-CM | POA: Diagnosis not present

## 2017-05-21 DIAGNOSIS — R1012 Left upper quadrant pain: Secondary | ICD-10-CM | POA: Diagnosis not present

## 2017-05-21 DIAGNOSIS — Z882 Allergy status to sulfonamides status: Secondary | ICD-10-CM | POA: Diagnosis not present

## 2017-05-21 DIAGNOSIS — R112 Nausea with vomiting, unspecified: Secondary | ICD-10-CM | POA: Diagnosis not present

## 2017-05-21 DIAGNOSIS — R1030 Lower abdominal pain, unspecified: Secondary | ICD-10-CM | POA: Diagnosis not present

## 2017-05-30 ENCOUNTER — Encounter: Payer: Self-pay | Admitting: Gastroenterology

## 2017-05-30 ENCOUNTER — Ambulatory Visit (INDEPENDENT_AMBULATORY_CARE_PROVIDER_SITE_OTHER): Payer: Medicare Other | Admitting: Gastroenterology

## 2017-05-30 VITALS — BP 148/72 | HR 52 | Ht 71.0 in | Wt 304.8 lb

## 2017-05-30 DIAGNOSIS — R197 Diarrhea, unspecified: Secondary | ICD-10-CM

## 2017-05-30 DIAGNOSIS — R1310 Dysphagia, unspecified: Secondary | ICD-10-CM

## 2017-05-30 DIAGNOSIS — K219 Gastro-esophageal reflux disease without esophagitis: Secondary | ICD-10-CM

## 2017-05-30 NOTE — Patient Instructions (Addendum)
Take your Protonix every day.  You have been scheduled for a Barium Esophogram at [facility] Radiology department on 06/04/17 at 10:00am. Please arrive 15 minutes prior to your appointment for registration. Make certain not to have anything to eat or drink 3 hours prior to your test. If you need to reschedule for any reason, please contact radiology at [phone] to do so. __________________________________________________________________ A barium swallow is an examination that concentrates on views of the esophagus. This tends to be a double contrast exam (barium and two liquids which, when combined, create a gas to distend the wall of the oesophagus) or single contrast (non-ionic iodine based). The study is usually tailored to your symptoms so a good history is essential. Attention is paid during the study to the form, structure and configuration of the esophagus, looking for functional disorders (such as aspiration, dysphagia, achalasia, motility and reflux) EXAMINATION You may be asked to change into a gown, depending on the type of swallow being performed. A radiologist and radiographer will perform the procedure. The radiologist will advise you of the type of contrast selected for your procedure and direct you during the exam. You will be asked to stand, sit or lie in several different positions and to hold a small amount of fluid in your mouth before being asked to swallow while the imaging is performed .In some instances you may be asked to swallow barium coated marshmallows to assess the motility of a solid food bolus. The exam can be recorded as a digital or video fluoroscopy procedure. POST PROCEDURE It will take 1-2 days for the barium to pass through your system. To facilitate this, it is important, unless otherwise directed, to increase your fluids for the next 24-48hrs and to resume your normal diet.  This test typically takes about 30 minutes to  perform. _________________________________________________________________________  Dennis Bast have been scheduled for an endoscopy. Please follow written instructions given to you at your visit today. If you use inhalers (even only as needed), please bring them with you on the day of your procedure. Your physician has requested that you go to www.startemmi.com and enter the access code given to you at your visit today. This web site gives a general overview about your procedure. However, you should still follow specific instructions given to you by our office regarding your preparation for the procedure.  Normal BMI (Body Mass Index- based on height and weight) is between 23 and 30. Your BMI today is Body mass index is 42.51 kg/m. Marland Kitchen Please consider follow up  regarding your BMI with your Primary Care Provider.  Thank you for choosing me and [facility].  Pricilla Riffle. Dagoberto Ligas., MD., [facility] Reg[facility]

## 2017-05-30 NOTE — Progress Notes (Signed)
History of Present Illness: This is a 66 year old male self referred for the evaluation of dysphagia and intermittent diarrhea. He relates one and a half years of liquid and solid food dysphagia. He notes regurgitation and mild heartburn for the past several weeks. He began taking Protonix 40 mg daily as needed for the past 3 weeks and noted some symptom improvement. He states he had an esophageal stricture dilated years ago by Dr. Docia Furl, perhaps around 2008. He states he underwent colonoscopy by Dr. Docia Furl around 2012 without polyps found. He states he was recommended to have a 10 year follow-up colonoscopy. He has had intermittent problems with chest pain felt to be reflux related that has improved with the use of Protonix however he does not take acid suppressing medications regularly. He relates 3 separate episodes of diarrhea associated with mild nausea and mild left lower quadrant pain that occurred on each of the last 3 weekends and then symptoms resolved. He cannot correlate it with any specific dietary stressors in between these episodes his bowel habits have returned to normal. Denies weight loss, constipation, change in stool caliber, melena, hematochezia, vomiting.   Allergies  Allergen Reactions  . Sulfa Antibiotics Itching   Outpatient Medications Prior to Visit  Medication Sig Dispense Refill  . aspirin EC 81 MG tablet Take 162 mg by mouth daily.    . clonazePAM (KLONOPIN) 0.5 MG tablet Take 1 tablet (0.5 mg total) by mouth 2 (two) times daily as needed for anxiety. 60 tablet 0  . furosemide (LASIX) 20 MG tablet Take 0.5 tablets (10 mg total) by mouth daily. 45 tablet 3  . latanoprost (XALATAN) 0.005 % ophthalmic solution Place 1 drop into both eyes at bedtime.    . metoprolol tartrate (LOPRESSOR) 25 MG tablet TAKE ONE-HALF TABLET (12.5 MG) BY MOUTH TWICE DAILY 90 tablet 1  . nitroGLYCERIN (NITROSTAT) 0.4 MG SL tablet Place 1 tablet (0.4 mg total) under the tongue every 5  (five) minutes x 3 doses as needed for chest pain. 30 tablet 1  . Omega-3 Fatty Acids (FISH OIL) 1000 MG CAPS Take 1,000 mg by mouth daily.    Marland Kitchen alfuzosin (UROXATRAL) 10 MG 24 hr tablet Take 1 tablet (10 mg total) by mouth daily with breakfast. 30 tablet 0  . buPROPion (WELLBUTRIN XL) 300 MG 24 hr tablet Take 1 tablet (300 mg total) by mouth at bedtime. 90 tablet 0  . rosuvastatin (CRESTOR) 20 MG tablet Take 1 tablet (20 mg total) by mouth every evening. 90 tablet 3  . senna-docusate (SENOKOT-S) 8.6-50 MG per tablet Take 2 tablets by mouth at bedtime. 60 tablet 1   No facility-administered medications prior to visit.    Past Medical History:  Diagnosis Date  . Anxiety   . Barrett's esophagus   . CAD (coronary artery disease)    PCI, stent 2011  . Cardiac arrhythmia   . Chicken pox   . Depression   . Diverticulosis   . Gastric ulcer   . GERD (gastroesophageal reflux disease)   . Glaucoma   . Hyperlipidemia   . Hypertension   . Kidney stones   . Pneumonia 11/2014  . Prostatitis   . Reflux   . UTI (lower urinary tract infection)    Past Surgical History:  Procedure Laterality Date  . cardiac stents    . EYE SURGERY Left    x 4  . EYE SURGERY Right    x 3   Social History  Social History  . Marital status: Divorced    Spouse name: N/A  . Number of children: 2  . Years of education: 16   Occupational History  . Medical Technologist     Retired   Social History Main Topics  . Smoking status: Former Smoker    Types: Cigarettes  . Smokeless tobacco: Never Used  . Alcohol use No  . Drug use: No  . Sexual activity: Not Asked   Other Topics Concern  . None   Social History Narrative   Fun: Paint, Training and development officer, travel   Denies religious beliefs effecting health care.    Family History  Problem Relation Age of Onset  . Arthritis Mother   . Hyperlipidemia Mother   . Stroke Mother   . Hypertension Mother   . Ovarian cancer Mother   . Hyperlipidemia Father   . Heart  disease Father   . Hypertension Father   . Non-Hodgkin's lymphoma Father   . Hyperlipidemia Maternal Grandmother   . Heart disease Maternal Grandmother   . Stroke Maternal Grandmother   . Hypertension Maternal Grandmother   . Diabetes Maternal Grandmother   . Hyperlipidemia Maternal Grandfather   . Heart disease Maternal Grandfather   . Hypertension Maternal Grandfather   . Heart disease Paternal Grandmother   . Heart disease Paternal Grandfather   . Stroke Paternal Grandfather   . Hypertension Paternal Grandfather   . Colon cancer Neg Hx   . Esophageal cancer Neg Hx   . Pancreatic cancer Neg Hx   . Stomach cancer Neg Hx   . Liver disease Neg Hx       Review of Systems: Pertinent positive and negative review of systems were noted in the above HPI section. All other review of systems were otherwise negative.   Physical Exam: General: Well developed, well nourished, no acute distress Head: Normocephalic and atraumatic Eyes:  sclerae anicteric, EOMI Ears: Normal auditory acuity Mouth: No deformity or lesions Neck: Supple, no masses or thyromegaly Lungs: Clear throughout to auscultation Heart: Regular rate and rhythm; no murmurs, rubs or bruits Abdomen: Soft, non tender and non distended. No masses, hepatosplenomegaly or hernias noted. Normal Bowel sounds Musculoskeletal: Symmetrical with no gross deformities  Skin: No lesions on visible extremities Pulses:  Normal pulses noted Extremities: No clubbing, cyanosis, edema or deformities noted Neurological: Alert oriented x 4, grossly nonfocal Cervical Nodes:  No significant cervical adenopathy Inguinal Nodes: No significant inguinal adenopathy Psychological:  Alert and cooperative. Normal mood and affect  Assessment and Recommendations:  1. Dysphagia to solids and liquids, GERD. Patient is advised to take pantoprazole 40 mg every morning every day and not intermittently or prn. Follow all standard antireflux measures. Request  records from Dr. Jon Gills office. Schedule barium esophagram and EGD. The risks (including bleeding, perforation, infection, missed lesions, medication reactions and possible hospitalization or surgery if complications occur), benefits, and alternatives to endoscopy with possible biopsy and possible dilation were discussed with the patient and they consent to proceed.   2. Intermittent diarrhea for past 3 weekends. Patient relates that he has stool culture bottles at home provided to him by [facility] urgent care center in [place] and if his diarrhea were to recur and will submit those.

## 2017-05-30 NOTE — Addendum Note (Signed)
Addended by: Marzella Schlein on: 05/30/2017 01:50 PM   Modules accepted: Orders

## 2017-06-04 ENCOUNTER — Ambulatory Visit (HOSPITAL_COMMUNITY)
Admission: RE | Admit: 2017-06-04 | Discharge: 2017-06-04 | Disposition: A | Discharge status: Home / Self Care | Payer: Medicare Other | Source: Ambulatory Visit | Attending: Gastroenterology | Admitting: Gastroenterology

## 2017-06-04 DIAGNOSIS — R1310 Dysphagia, unspecified: Secondary | ICD-10-CM | POA: Insufficient documentation

## 2017-06-04 DIAGNOSIS — K219 Gastro-esophageal reflux disease without esophagitis: Secondary | ICD-10-CM | POA: Diagnosis not present

## 2017-06-04 DIAGNOSIS — K222 Esophageal obstruction: Secondary | ICD-10-CM | POA: Diagnosis not present

## 2017-06-04 DIAGNOSIS — K220 Achalasia of cardia: Secondary | ICD-10-CM | POA: Diagnosis not present

## 2017-06-04 DIAGNOSIS — Q395 Congenital dilatation of esophagus: Secondary | ICD-10-CM | POA: Diagnosis not present

## 2017-06-05 ENCOUNTER — Encounter: Payer: Self-pay | Admitting: Gastroenterology

## 2017-06-05 ENCOUNTER — Ambulatory Visit (AMBULATORY_SURGERY_CENTER): Payer: Medicare Other | Admitting: Gastroenterology

## 2017-06-05 VITALS — BP 143/74 | HR 62 | Temp 98.0°F | Resp 6 | Ht 71.0 in | Wt 304.0 lb

## 2017-06-05 DIAGNOSIS — R1310 Dysphagia, unspecified: Secondary | ICD-10-CM | POA: Diagnosis not present

## 2017-06-05 DIAGNOSIS — R933 Abnormal findings on diagnostic imaging of other parts of digestive tract: Secondary | ICD-10-CM | POA: Diagnosis not present

## 2017-06-05 MED ORDER — SODIUM CHLORIDE 0.9 % IV SOLN: 500.0000 mL | INTRAVENOUS | Status: AC

## 2017-06-05 NOTE — Progress Notes (Signed)
Called to room to assist during endoscopic procedure.  Patient ID and intended procedure confirmed with present staff. Received instructions for my participation in the procedure from the performing physician.  

## 2017-06-05 NOTE — Progress Notes (Signed)
A and O x3. Report to RN. Tolerated MAC anesthesia well.Teeth unchanged after procedure.

## 2017-06-05 NOTE — Patient Instructions (Signed)
YOU HAD AN ENDOSCOPIC PROCEDURE TODAY AT [facility]:   Refer to the procedure report that was given to you for any specific questions about what was found during the examination.  If the procedure report does not answer your questions, please call your gastroenterologist to clarify.  If you requested that your care partner not be given the details of your procedure findings, then the procedure report has been included in a sealed envelope for you to review at your convenience later.  YOU SHOULD EXPECT: Some feelings of bloating in the abdomen. Passage of more gas than usual.  Walking can help get rid of the air that was put into your GI tract during the procedure and reduce the bloating.   Please Note:  You might notice some irritation and congestion in your nose or some drainage.  This is from the oxygen used during your procedure.  There is no need for concern and it should clear up in a day or so.  SYMPTOMS TO REPORT IMMEDIATELY:    Following upper endoscopy (EGD)  Vomiting of blood or coffee ground material  New chest pain or pain under the shoulder blades  Painful or persistently difficult swallowing  New shortness of breath  Fever of 100F or higher  Black, tarry-looking stools  For urgent or emergent issues, a gastroenterologist can be reached at any hour by calling [phone].   DIET:  Clear liquids until 1pm, and then a soft diet for the rest of today.  You may have a regular diet tomorrow.  Drink plenty of fluids but you should avoid alcoholic beverages for 24 hours.  ACTIVITY:  You should plan to take it easy for the rest of today and you should NOT DRIVE or use heavy machinery until tomorrow (because of the sedation medicines used during the test).    FOLLOW UP: Our staff will call the number listed on your records the next business day following your procedure to check on you and address any questions or concerns that you may have regarding the information  given to you following your procedure. If we do not reach you, we will leave a message.  However, if you are feeling well and you are not experiencing any problems, there is no need to return our call.  We will assume that you have returned to your regular daily activities without incident.  If any biopsies were taken you will be contacted by phone or by letter within the next 1-3 weeks.  Please call us at [phone] if you have not heard about the biopsies in 3 weeks.    SIGNATURES/CONFIDENTIALITY: You and/or your care partner have signed paperwork which will be entered into your electronic medical record.  These signatures attest to the fact that that the information above on your After Visit Summary has been reviewed and is understood.  Full responsibility of the confidentiality of this discharge information lies with you and/or your care-partner.  Read all of the handouts given to you by your recovery room nurse.

## 2017-06-05 NOTE — Op Note (Signed)
[facility] Patient Name: Sergio Griffin Procedure Date: 06/05/2017 10:42 AM MRN: [id number] Endoscopist: Ladene Artist , MD Age: 66 Referring MD:  Date of Birth: Dec 19, 1950 Gender: Male Account #: [id number] Procedure:                Upper GI endoscopy Indications:              Dysphagia, Abnormal barium esophagram Medicines:                Monitored Anesthesia Care Procedure:                Pre-Anesthesia Assessment:                           - Prior to the procedure, a History and Physical                            was performed, and patient medications and                            allergies were reviewed. The patient's tolerance of                            previous anesthesia was also reviewed. The risks                            and benefits of the procedure and the sedation                            options and risks were discussed with the patient.                            All questions were answered, and informed consent                            was obtained. Prior Anticoagulants: The patient has                            taken no previous anticoagulant or antiplatelet                            agents. ASA Grade Assessment: III - A patient with                            severe systemic disease. After reviewing the risks                            and benefits, the patient was deemed in                            satisfactory condition to undergo the procedure.                           After obtaining informed consent, the endoscope was  passed under direct vision. Throughout the                            procedure, the patient's blood pressure, pulse, and                            oxygen saturations were monitored continuously. The                            Endoscope was introduced through the mouth, and                            advanced to the second part of duodenum. The upper                            GI endoscopy  was accomplished without difficulty.                            The patient tolerated the procedure well. Scope In: Scope Out: Findings:                 No endoscopic abnormality was evident in the                            esophagus to explain the patient's complaint of                            dysphagia. Given his symptoms it was decided to                            proceed with dilation of the entire esophagus. A                            guidewire was placed and the scope was withdrawn.                            Dilation was performed with a Savary dilator with                            mild resistance at 16 mm. No heme noted.                           A hypertonic lower esophageal sphincter was noted.                           The entire examined stomach was normal.                           The duodenal bulb and second portion of the                            duodenum were normal. Complications:            No immediate complications. Estimated Blood Loss:     Estimated blood  loss: none. Impression:               - No endoscopic esophageal abnormality to explain                            patient's dysphagia. Esophagus dilated. Dilated.                           - Hypertonic lower esophageal sphincter.                           - Normal stomach.                           - Normal duodenal bulb and second portion of the                            duodenum.                           - No specimens collected. Recommendation:           - Patient has a contact number available for                            emergencies. The signs and symptoms of potential                            delayed complications were discussed with the                            patient. Return to normal activities tomorrow.                            Written discharge instructions were provided to the                            patient.                           - Clear liquid diet for 2 hours, then  advance as                            tolerated to soft diet today. Resume prior diet                            tomorrow.                           - Continue present medications.                           - If dysphagia persists proceed with esophageal                            manometry. Ladene Artist, MD 06/05/2017 10:56:31 AM This report has been signed electronically.

## 2017-06-06 ENCOUNTER — Telehealth: Payer: Self-pay | Admitting: *Deleted

## 2017-06-06 NOTE — Telephone Encounter (Signed)
  Follow up Call-  Call back number 06/05/2017  Post procedure Call Back phone  # [phone]  Permission to leave phone message Yes     Patient questions:  Do you have a fever, pain , or abdominal swelling? No. Pain Score  0 *  Have you tolerated food without any problems? Yes.    Have you been able to return to your normal activities? Yes.    Do you have any questions about your discharge instructions: Diet   No. Medications  No. Follow up visit  No.  Do you have questions or concerns about your Care? No.  Actions: * If pain score is 4 or above: No action needed, pain <4.

## 2017-07-16 ENCOUNTER — Telehealth: Payer: Self-pay | Admitting: Gastroenterology

## 2017-07-16 NOTE — Telephone Encounter (Signed)
GI pathogen panel  

## 2017-07-16 NOTE — Telephone Encounter (Signed)
He should get his PCP or another MD he sees in [place] area to order.

## 2017-07-16 NOTE — Telephone Encounter (Signed)
Patient notified.  He will let me know if he will come here for the labs

## 2017-07-16 NOTE — Telephone Encounter (Signed)
Patient does not want to come here for the pathogen panel.  He wants you to order a stool culture and sensitivity through [facility] in [place]. Is this ok?

## 2017-07-16 NOTE — Telephone Encounter (Signed)
Dr. Fuller Plan your note indicates possible stool studies if diarrhea returns.  Please advise what stool studies you would like to order.

## 2017-08-12 ENCOUNTER — Ambulatory Visit: Payer: 59 | Admitting: Physician Assistant

## 2017-08-15 DIAGNOSIS — L304 Erythema intertrigo: Secondary | ICD-10-CM | POA: Diagnosis not present

## 2017-08-20 ENCOUNTER — Ambulatory Visit: Payer: 59 | Admitting: Physician Assistant

## 2017-08-28 ENCOUNTER — Ambulatory Visit (INDEPENDENT_AMBULATORY_CARE_PROVIDER_SITE_OTHER): Payer: Medicare Other | Admitting: Physician Assistant

## 2017-08-28 ENCOUNTER — Encounter: Payer: Self-pay | Admitting: Physician Assistant

## 2017-08-28 VITALS — BP 126/72 | HR 66 | Ht 71.5 in | Wt 315.0 lb

## 2017-08-28 DIAGNOSIS — R110 Nausea: Secondary | ICD-10-CM

## 2017-08-28 DIAGNOSIS — R1310 Dysphagia, unspecified: Secondary | ICD-10-CM | POA: Diagnosis not present

## 2017-08-28 NOTE — Progress Notes (Signed)
Chief Complaint: Dysphagia,Nausea  HPI:  Sergio Griffin is a 66 year old Caucasian male with a past medical history as listed below who follows with Dr. Fuller Plan and returns to clinic today for continued complaint of dysphagia as well as nausea.   Please recall patient was last seen in clinic 05/30/17 by Dr. Fuller Plan and at that time described 1 1/2 years of liquid and solid food dysphagia. He described regurgitation and mild heartburn for several weeks. He had been taking Protonix 40 mg daily as needed for 3 weeks and noted some improvement. Apparently, he had a history of stricture dilated in 2008. At that time, patient had a barium esophagram and EGD. Barium esophagram showed a beaked appearance of the distal esophagus throughout the study with only intermittent small passage of barium through the [place] and into the stomach. Patient also had mildly patulous thoracic esophagus with normal mucosal pattern elsewhere.     Patient then had EGD 06/05/17 which revealed no endoscopic esophageal abnormality to explain dysphagia. The entire esophagus was dilated to 16 mm. Patient had hypertonic lower esophageal sphincter. Exam was otherwise normal. It was recommended that if patient continued with dysphagia symptoms he should have an esophageal manometry.    Today, the patient returns to clinic and explains that for about 2 weeks after dilation above he did see some relief of his "choking", but this all started to come back. He tells me that whenever he eats or drinks he will choke but he never knows when. He may do well for a couple meals of the day but then the third meal will get stuck and he just has to "take deep breaths and relax" and eventually this will go down. Patient tells me that when he chokes he will cough. This is very unpredictable. Patient also tells me he has trouble with swallowing pills. He used to be able to chase pills with applesauce but now has to eat something solid such as bread in order to get pills  down. Associated symptoms include "burping all the time". Patient does tell me that when he becomes upset or stressed this feeling is somewhat worse.   Patient also describes a unique feeling of "uncomfortableness/burning" in his esophagus if it is "moving around", such as if he leans to one side or bends his neck forward for a period of time it is uncomfortable and he has to sit up sit straight. Patient does continue his Pantoprazole 40 mg once a day. Apparently, he stopped this a few weeks ago but restarted about 3 days ago due to an increase in reflux symptoms when he tried to stop.   Patient denies fever, chills, blood in his stool, melena, weight loss, anorexia, vomiting or symptoms that awaken him at night.  Past Medical History:  Diagnosis Date  . Anxiety   . Barrett's esophagus   . CAD (coronary artery disease)    PCI, stent 2011  . Cardiac arrhythmia   . Chicken pox   . Depression   . Diverticulosis   . Gastric ulcer   . GERD (gastroesophageal reflux disease)   . Glaucoma   . Hyperlipidemia   . Hypertension   . Kidney stones   . Pneumonia 11/2014  . Prostatitis   . Reflux   . UTI (lower urinary tract infection)     Past Surgical History:  Procedure Laterality Date  . cardiac stents    . EYE SURGERY Left    x 4  . EYE SURGERY Right  x 3    Current Outpatient Prescriptions  Medication Sig Dispense Refill  . metoprolol tartrate (LOPRESSOR) 25 MG tablet Take 25 mg by mouth 2 (two) times daily.    Marland Kitchen aspirin EC 81 MG tablet Take 162 mg by mouth daily.    . clonazePAM (KLONOPIN) 0.5 MG tablet Take 1 tablet (0.5 mg total) by mouth 2 (two) times daily as needed for anxiety. 60 tablet 0  . furosemide (LASIX) 20 MG tablet Take 0.5 tablets (10 mg total) by mouth daily. 45 tablet 3  . latanoprost (XALATAN) 0.005 % ophthalmic solution Place 1 drop into both eyes at bedtime.    Marland Kitchen losartan (COZAAR) 50 MG tablet Take 50 mg by mouth 2 (two) times daily.     . nitroGLYCERIN  (NITROSTAT) 0.4 MG SL tablet Place 1 tablet (0.4 mg total) under the tongue every 5 (five) minutes x 3 doses as needed for chest pain. 30 tablet 1  . Omega-3 Fatty Acids (FISH OIL) 1000 MG CAPS Take 1,000 mg by mouth daily.    . pantoprazole (PROTONIX) 40 MG tablet Take 40 mg by mouth daily.     Current Facility-Administered Medications  Medication Dose Route Frequency Provider Last Rate Last Dose  . 0.9 %  sodium chloride infusion  500 mL Intravenous Continuous Ladene Artist, MD        Allergies as of 08/28/2017 - Review Complete 08/28/2017  Allergen Reaction Noted  . Other Itching 06/05/2017  . Sulfa antibiotics Itching 06/10/2015    Family History  Problem Relation Age of Onset  . Arthritis Mother   . Hyperlipidemia Mother   . Stroke Mother   . Hypertension Mother   . Ovarian cancer Mother   . Hyperlipidemia Father   . Heart disease Father   . Hypertension Father   . Non-Hodgkin's lymphoma Father   . Hyperlipidemia Maternal Grandmother   . Heart disease Maternal Grandmother   . Stroke Maternal Grandmother   . Hypertension Maternal Grandmother   . Diabetes Maternal Grandmother   . Hyperlipidemia Maternal Grandfather   . Heart disease Maternal Grandfather   . Hypertension Maternal Grandfather   . Heart disease Paternal Grandmother   . Heart disease Paternal Grandfather   . Stroke Paternal Grandfather   . Hypertension Paternal Grandfather   . Colon cancer Neg Hx   . Esophageal cancer Neg Hx   . Pancreatic cancer Neg Hx   . Stomach cancer Neg Hx   . Liver disease Neg Hx     Social History   Social History  . Marital status: Divorced    Spouse name: N/A  . Number of children: 2  . Years of education: 16   Occupational History  . Medical Technologist     Retired   Social History Main Topics  . Smoking status: Former Smoker    Types: Cigarettes  . Smokeless tobacco: Never Used  . Alcohol use No  . Drug use: No  . Sexual activity: Not on file   Other  Topics Concern  . Not on file   Social History Narrative   Fun: Paint, cook, travel   Denies religious beliefs effecting health care.     Review of Systems:    Constitutional: No weight loss, fever or chills Cardiovascular: No chest pain Respiratory: No SOB  Gastrointestinal: See HPI and otherwise negative   Physical Exam:  Vital signs: BP 126/72   Pulse 66   Ht 5' 11.5" (1.816 m)   Wt (!) 315 lb (142.9  kg)   BMI 43.32 kg/m   Constitutional:   Pleasant overweight Caucasian male appears to be in NAD, Well developed, Well nourished, alert and cooperative Respiratory: Respirations even and unlabored. Lungs clear to auscultation bilaterally.   No wheezes, crackles, or rhonchi.  Cardiovascular: Normal S1, S2. No MRG. Regular rate and rhythm. No peripheral edema, cyanosis or pallor.  Gastrointestinal:  Soft, nondistended, nontender. No rebound or guarding. Normal bowel sounds. No appreciable masses or hepatomegaly. Psychiatric:  Demonstrates good judgement and reason without abnormal affect or behaviors.  No recent labs or imaging.  Assessment: 1. Dysphagia: Continues, esophagram showed possible achalasia, EGD was unrevealing; consider achalasia versus motility disorder versus other 2. Nausea: Worse with above; consider element of functional dyspepsia  Plan: 1. Scheduled patient for an esophageal manometry with a 24 hour pH probe per Dr/ Lynne Leader recommendations 2. Patient to continue his Pantoprazole 40 mg once daily 3. Did discuss with the patient that he should find ways to relax/destress throughout the day 4. Patient to follow in clinic with Dr. Fuller Plan or myself in 4-6 weeks or sooner if necessary.  Ellouise Newer, PA-C [facility] 08/28/2017, 10:03 AM  Cc: Golden Circle, FNP

## 2017-08-28 NOTE — Patient Instructions (Addendum)
You have been scheduled for an esophageal manometry at [facility] on 09/11/17 at 10:30 pm. Please arrive 30 minutes prior to your procedure for registration. You will need to go to outpatient registration (1st floor of the hospital) first. Make certain to bring your insurance cards as well as a complete list of medications.  Please remember the following:  1) Do not take any muscle relaxants, xanax (alprazolam) or ativan for 1 day prior to your     test as well as the day of the test.  2) Nothing to eat or drink after 12:00 midnight on the night before your test.  3) Hold all diabetic medications/insulin the morning of the test. You may eat and take             your medications after the test.  It will take at least 2 weeks to receive the results of this test from your physician. ------------------------------------------ ABOUT ESOPHAGEAL MANOMETRY Esophageal manometry (muh-NOM-uh-tree) is a test that gauges how well your esophagus works. Your esophagus is the long, muscular tube that connects your throat to your stomach. Esophageal manometry measures the rhythmic muscle contractions (peristalsis) that occur in your esophagus when you swallow. Esophageal manometry also measures the coordination and force exerted by the muscles of your esophagus.  During esophageal manometry, a thin, flexible tube (catheter) that contains sensors is passed through your nose, down your esophagus and into your stomach. Esophageal manometry can be helpful in diagnosing some mostly uncommon disorders that affect your esophagus.  Why it's done Esophageal manometry is used to evaluate the movement (motility) of food through the esophagus and into the stomach. The test measures how well the circular bands of muscle (sphincters) at the top and bottom of your esophagus open and close, as well as the pressure, strength and pattern of the wave of esophageal muscle contractions that moves food along.  What you can  expect Esophageal manometry is an outpatient procedure done without sedation. Most people tolerate it well. You may be asked to change into a hospital gown before the test starts.  During esophageal manometry  While you are sitting up, a member of your health care team sprays your throat with a numbing medication or puts numbing gel in your nose or both.  A catheter is guided through your nose into your esophagus. The catheter may be sheathed in a water-filled sleeve. It doesn't interfere with your breathing. However, your eyes may water, and you may gag. You may have a slight nosebleed from irritation.  After the catheter is in place, you may be asked to lie on your back on an exam table, or you may be asked to remain seated.  You then swallow small sips of water. As you do, a computer connected to the catheter records the pressure, strength and pattern of your esophageal muscle contractions.  During the test, you'll be asked to breathe slowly and smoothly, remain as still as possible, and swallow only when you're asked to do so.  A member of your health care team may move the catheter down into your stomach while the catheter continues its measurements.  The catheter then is slowly withdrawn. The test usually lasts 20 to 30 minutes.  After esophageal manometry  When your esophageal manometry is complete, you may return to your normal activities  This test typically takes 30-45 minutes to complete. ________________________________________________________________________________

## 2017-08-29 NOTE — Progress Notes (Signed)
Reviewed and agree with management plan. Agree with esophageal manometry.   Pricilla Riffle. Fuller Plan, MD [facility]

## 2017-08-30 ENCOUNTER — Telehealth: Payer: Self-pay

## 2017-08-30 NOTE — Telephone Encounter (Signed)
-----   Message from Ladene Artist, MD sent at 08/29/2017  4:54 PM EDT ----- Governor Rooks ordered a manometry and 24hr pH study on this pt. Please DC the 24 hr pH study and be sure the mano is under Dr Silverio Decamp to read.

## 2017-08-30 NOTE — Telephone Encounter (Signed)
I spoke with Sharee Pimple at central scheduling and the 24 hour pH probe has been cancelled.

## 2017-09-09 NOTE — Progress Notes (Signed)
Called pt to confirm appt for esophageal manometry for 09/11/2017 at 1030. Pt confirmed that he was planning to come. Questions answered.

## 2017-09-10 ENCOUNTER — Telehealth: Payer: Self-pay | Admitting: Physician Assistant

## 2017-09-10 NOTE — Telephone Encounter (Signed)
Patient notified that the 24 hour probe was not needed, but he should proceed with manometry.  All additional questions answered.

## 2017-09-11 ENCOUNTER — Encounter (HOSPITAL_COMMUNITY)
Admission: RE | Disposition: A | Discharge status: Home / Self Care | Payer: Self-pay | Source: Ambulatory Visit | Attending: Gastroenterology

## 2017-09-11 ENCOUNTER — Ambulatory Visit (HOSPITAL_COMMUNITY)
Admission: RE | Admit: 2017-09-11 | Discharge: 2017-09-11 | Disposition: A | Discharge status: Home / Self Care | Payer: Medicare Other | Source: Ambulatory Visit | Attending: Gastroenterology | Admitting: Gastroenterology

## 2017-09-11 DIAGNOSIS — R1310 Dysphagia, unspecified: Secondary | ICD-10-CM | POA: Diagnosis not present

## 2017-09-11 DIAGNOSIS — R1319 Other dysphagia: Secondary | ICD-10-CM

## 2017-09-11 DIAGNOSIS — R110 Nausea: Secondary | ICD-10-CM | POA: Insufficient documentation

## 2017-09-11 DIAGNOSIS — K222 Esophageal obstruction: Secondary | ICD-10-CM | POA: Insufficient documentation

## 2017-09-11 HISTORY — PX: ESOPHAGEAL MANOMETRY: SHX5429

## 2017-09-11 SURGERY — MANOMETRY, ESOPHAGUS

## 2017-09-11 MED ORDER — LIDOCAINE VISCOUS 2 % MT SOLN
OROMUCOSAL | Status: AC
  Filled 2017-09-11: qty 15

## 2017-09-11 SURGICAL SUPPLY — 2 items
FACESHIELD LNG OPTICON STERILE (SAFETY) IMPLANT
GLOVE BIO SURGEON STRL SZ8 (GLOVE) ×6 IMPLANT

## 2017-09-11 NOTE — Progress Notes (Signed)
Esophageal Manometry done per protocol.  Patient tolerated well, w/o complications.  Dr. Silverio Decamp to be notified today of study.  Laverta Baltimore, RN

## 2017-09-12 ENCOUNTER — Telehealth: Payer: Self-pay | Admitting: Gastroenterology

## 2017-09-12 ENCOUNTER — Encounter (HOSPITAL_COMMUNITY): Payer: Self-pay | Admitting: Gastroenterology

## 2017-09-12 NOTE — Progress Notes (Addendum)
Returned a call from the patient.  Patient stated he was having chest pain with any type of exertion since his esophageal manometry yesterday.  Patient also stated it was the same chest pain that brought him Dr Fuller Plan to have a manometry.  Patient feels maybe the manometry exacerbated the problem.  Patient said the pain is not cardiac and he would know since he has had an MI in the past.  Patient was told to go to the ED if the pain worsens or changes.  Patient stated he understood.   This call was returned on 09/12/2017 at 1650.

## 2017-09-12 NOTE — Telephone Encounter (Signed)
Patient reports that since manometry yesterday .  Pain started with activity of lifting anything.  Pain is in his chest "my esophagus".  Pain improves with ibuprofen and rest.  He is advised to rest, continue ibuprofen and let us know if the pain does not resolve.  Denies SOB or pain at rest.

## 2017-09-14 DIAGNOSIS — I200 Unstable angina: Secondary | ICD-10-CM | POA: Diagnosis not present

## 2017-09-14 DIAGNOSIS — I2510 Atherosclerotic heart disease of native coronary artery without angina pectoris: Secondary | ICD-10-CM | POA: Diagnosis present

## 2017-09-14 DIAGNOSIS — F419 Anxiety disorder, unspecified: Secondary | ICD-10-CM | POA: Diagnosis present

## 2017-09-14 DIAGNOSIS — I214 Non-ST elevation (NSTEMI) myocardial infarction: Secondary | ICD-10-CM | POA: Diagnosis present

## 2017-09-14 DIAGNOSIS — H409 Unspecified glaucoma: Secondary | ICD-10-CM | POA: Diagnosis present

## 2017-09-14 DIAGNOSIS — I517 Cardiomegaly: Secondary | ICD-10-CM | POA: Diagnosis not present

## 2017-09-14 DIAGNOSIS — Y711 Therapeutic (nonsurgical) and rehabilitative cardiovascular devices associated with adverse incidents: Secondary | ICD-10-CM | POA: Diagnosis not present

## 2017-09-14 DIAGNOSIS — I10 Essential (primary) hypertension: Secondary | ICD-10-CM | POA: Diagnosis not present

## 2017-09-14 DIAGNOSIS — I5030 Unspecified diastolic (congestive) heart failure: Secondary | ICD-10-CM | POA: Diagnosis present

## 2017-09-14 DIAGNOSIS — K219 Gastro-esophageal reflux disease without esophagitis: Secondary | ICD-10-CM | POA: Diagnosis present

## 2017-09-14 DIAGNOSIS — J189 Pneumonia, unspecified organism: Secondary | ICD-10-CM | POA: Diagnosis present

## 2017-09-14 DIAGNOSIS — T82855A Stenosis of coronary artery stent, initial encounter: Secondary | ICD-10-CM | POA: Diagnosis present

## 2017-09-14 DIAGNOSIS — B369 Superficial mycosis, unspecified: Secondary | ICD-10-CM | POA: Diagnosis present

## 2017-09-14 DIAGNOSIS — R911 Solitary pulmonary nodule: Secondary | ICD-10-CM | POA: Diagnosis present

## 2017-09-14 DIAGNOSIS — E119 Type 2 diabetes mellitus without complications: Secondary | ICD-10-CM | POA: Diagnosis present

## 2017-09-14 DIAGNOSIS — R079 Chest pain, unspecified: Secondary | ICD-10-CM | POA: Diagnosis not present

## 2017-09-14 DIAGNOSIS — Z87891 Personal history of nicotine dependence: Secondary | ICD-10-CM | POA: Diagnosis not present

## 2017-09-14 DIAGNOSIS — Z955 Presence of coronary angioplasty implant and graft: Secondary | ICD-10-CM | POA: Diagnosis not present

## 2017-09-14 DIAGNOSIS — I371 Nonrheumatic pulmonary valve insufficiency: Secondary | ICD-10-CM | POA: Diagnosis not present

## 2017-09-14 DIAGNOSIS — I361 Nonrheumatic tricuspid (valve) insufficiency: Secondary | ICD-10-CM | POA: Diagnosis not present

## 2017-09-14 DIAGNOSIS — R0789 Other chest pain: Secondary | ICD-10-CM | POA: Diagnosis not present

## 2017-09-14 DIAGNOSIS — I110 Hypertensive heart disease with heart failure: Secondary | ICD-10-CM | POA: Diagnosis present

## 2017-09-19 DIAGNOSIS — R1319 Other dysphagia: Secondary | ICD-10-CM

## 2017-09-19 DIAGNOSIS — R1310 Dysphagia, unspecified: Secondary | ICD-10-CM

## 2017-09-23 DIAGNOSIS — R9431 Abnormal electrocardiogram [ECG] [EKG]: Secondary | ICD-10-CM | POA: Diagnosis not present

## 2017-09-23 DIAGNOSIS — I2510 Atherosclerotic heart disease of native coronary artery without angina pectoris: Secondary | ICD-10-CM | POA: Diagnosis not present

## 2017-09-23 DIAGNOSIS — I10 Essential (primary) hypertension: Secondary | ICD-10-CM | POA: Diagnosis not present

## 2017-09-23 DIAGNOSIS — I214 Non-ST elevation (NSTEMI) myocardial infarction: Secondary | ICD-10-CM | POA: Diagnosis not present

## 2017-09-23 DIAGNOSIS — E782 Mixed hyperlipidemia: Secondary | ICD-10-CM | POA: Diagnosis not present

## 2017-09-24 DIAGNOSIS — M79601 Pain in right arm: Secondary | ICD-10-CM | POA: Diagnosis not present

## 2017-09-24 DIAGNOSIS — R2231 Localized swelling, mass and lump, right upper limb: Secondary | ICD-10-CM | POA: Diagnosis not present

## 2017-09-25 ENCOUNTER — Telehealth: Payer: Self-pay | Admitting: Gastroenterology

## 2017-09-25 NOTE — Telephone Encounter (Signed)
Esophageal manometry report is in Epic.

## 2017-09-26 DIAGNOSIS — H401133 Primary open-angle glaucoma, bilateral, severe stage: Secondary | ICD-10-CM | POA: Diagnosis not present

## 2017-09-26 NOTE — Telephone Encounter (Signed)
Esophageal manometry questions obstruction vs Grade III achalasia. Recent EGD in July- recommend surgical referral? Thanks-JLL

## 2017-09-26 NOTE — Telephone Encounter (Signed)
I'm asking VN to review the case. I suspect achalasia and balloon dilation or surgical referral are options however I would like her opinion.

## 2017-09-27 ENCOUNTER — Telehealth: Payer: Self-pay | Admitting: Gastroenterology

## 2017-09-27 NOTE — Telephone Encounter (Signed)
The pt has been advised that Dr Silverio Decamp is reviewing the [facility] and we will call him as soon as resulted.

## 2017-10-14 DIAGNOSIS — I10 Essential (primary) hypertension: Secondary | ICD-10-CM | POA: Diagnosis not present

## 2017-10-14 DIAGNOSIS — R0602 Shortness of breath: Secondary | ICD-10-CM | POA: Diagnosis not present

## 2017-10-14 DIAGNOSIS — I2510 Atherosclerotic heart disease of native coronary artery without angina pectoris: Secondary | ICD-10-CM | POA: Diagnosis not present

## 2017-10-14 DIAGNOSIS — R0609 Other forms of dyspnea: Secondary | ICD-10-CM | POA: Diagnosis not present

## 2017-10-16 DIAGNOSIS — E7800 Pure hypercholesterolemia, unspecified: Secondary | ICD-10-CM | POA: Diagnosis not present

## 2017-10-16 DIAGNOSIS — I252 Old myocardial infarction: Secondary | ICD-10-CM | POA: Diagnosis not present

## 2017-10-16 DIAGNOSIS — I10 Essential (primary) hypertension: Secondary | ICD-10-CM | POA: Diagnosis not present

## 2017-10-16 DIAGNOSIS — E785 Hyperlipidemia, unspecified: Secondary | ICD-10-CM | POA: Diagnosis not present

## 2017-10-16 DIAGNOSIS — Z23 Encounter for immunization: Secondary | ICD-10-CM | POA: Diagnosis not present

## 2017-10-16 DIAGNOSIS — E669 Obesity, unspecified: Secondary | ICD-10-CM | POA: Diagnosis not present

## 2017-10-16 DIAGNOSIS — Z6841 Body Mass Index (BMI) 40.0 and over, adult: Secondary | ICD-10-CM | POA: Diagnosis not present

## 2017-10-16 DIAGNOSIS — Z955 Presence of coronary angioplasty implant and graft: Secondary | ICD-10-CM | POA: Diagnosis not present

## 2017-10-17 ENCOUNTER — Telehealth: Payer: Self-pay | Admitting: Gastroenterology

## 2017-10-17 NOTE — Telephone Encounter (Signed)
Tells me he has been sitting by the phone waiting for call back with test results. He was very upset. Asking for a call back even if test results are not available. Just wants to hear something today.

## 2017-10-17 NOTE — Telephone Encounter (Signed)
Please set up office appt with Dr. Silverio Decamp as she suggested.

## 2017-10-17 NOTE — Telephone Encounter (Signed)
See prior notes. Please inform him of manometry diagnosis and awaiting opinion from Dr. Silverio Decamp regarding balloon dilation or surgical referral to treat achalasia.

## 2017-10-17 NOTE — Telephone Encounter (Signed)
Patient states he was told Dr.Stark would contact him to let him know results of manometry done on 10.24.18 and he has still not been contacted. Patient was also scheduled for ov for 12.3.18 and states he was not notified about this appt and has another appt at the same time, so had to cancel. Patient requesting call from the nurse to discuss results and next appt.

## 2017-10-17 NOTE — Telephone Encounter (Signed)
Sergio Griffin I see where Dr Silverio Decamp reviewed the [facility] for Dr Fuller Plan (see phone note)  I also see that Dr Silverio Decamp did review but I do not see any recommendations.  The pt is calling for results and further req's.  Please advise

## 2017-10-17 NOTE — Telephone Encounter (Signed)
The pt has been given an appt with Dr Silverio Decamp on 12/17 at 3:30 pm

## 2017-10-17 NOTE — Telephone Encounter (Signed)
mano was ordered by Ellouise Newer, PA

## 2017-10-17 NOTE — Telephone Encounter (Signed)
Manometry findings showed EGJ outflow obstruction . Though not consistent with classic achalasia, cannot exclude spastic achalasia. I can bring him in for office visit soon to discuss treatment options pneumatic dilation vs surgical myotomy if he is interested in proceeding

## 2017-10-18 ENCOUNTER — Telehealth: Payer: Self-pay | Admitting: Gastroenterology

## 2017-10-18 DIAGNOSIS — N179 Acute kidney failure, unspecified: Secondary | ICD-10-CM | POA: Diagnosis not present

## 2017-10-18 DIAGNOSIS — Z885 Allergy status to narcotic agent status: Secondary | ICD-10-CM | POA: Diagnosis not present

## 2017-10-18 DIAGNOSIS — Z6841 Body Mass Index (BMI) 40.0 and over, adult: Secondary | ICD-10-CM | POA: Diagnosis not present

## 2017-10-18 DIAGNOSIS — Z7902 Long term (current) use of antithrombotics/antiplatelets: Secondary | ICD-10-CM | POA: Diagnosis not present

## 2017-10-18 DIAGNOSIS — E860 Dehydration: Secondary | ICD-10-CM | POA: Diagnosis not present

## 2017-10-18 DIAGNOSIS — I252 Old myocardial infarction: Secondary | ICD-10-CM | POA: Diagnosis not present

## 2017-10-18 DIAGNOSIS — E669 Obesity, unspecified: Secondary | ICD-10-CM | POA: Diagnosis not present

## 2017-10-18 DIAGNOSIS — R197 Diarrhea, unspecified: Secondary | ICD-10-CM | POA: Diagnosis not present

## 2017-10-18 DIAGNOSIS — I2510 Atherosclerotic heart disease of native coronary artery without angina pectoris: Secondary | ICD-10-CM | POA: Diagnosis not present

## 2017-10-18 DIAGNOSIS — K529 Noninfective gastroenteritis and colitis, unspecified: Secondary | ICD-10-CM | POA: Diagnosis not present

## 2017-10-18 DIAGNOSIS — R0789 Other chest pain: Secondary | ICD-10-CM | POA: Diagnosis not present

## 2017-10-18 DIAGNOSIS — R7303 Prediabetes: Secondary | ICD-10-CM | POA: Diagnosis not present

## 2017-10-18 DIAGNOSIS — Z886 Allergy status to analgesic agent status: Secondary | ICD-10-CM | POA: Diagnosis not present

## 2017-10-18 DIAGNOSIS — N19 Unspecified kidney failure: Secondary | ICD-10-CM | POA: Diagnosis not present

## 2017-10-18 DIAGNOSIS — I10 Essential (primary) hypertension: Secondary | ICD-10-CM | POA: Diagnosis not present

## 2017-10-18 DIAGNOSIS — F329 Major depressive disorder, single episode, unspecified: Secondary | ICD-10-CM | POA: Diagnosis not present

## 2017-10-18 DIAGNOSIS — Z7982 Long term (current) use of aspirin: Secondary | ICD-10-CM | POA: Diagnosis not present

## 2017-10-18 DIAGNOSIS — H409 Unspecified glaucoma: Secondary | ICD-10-CM | POA: Diagnosis not present

## 2017-10-18 DIAGNOSIS — R42 Dizziness and giddiness: Secondary | ICD-10-CM | POA: Diagnosis not present

## 2017-10-18 DIAGNOSIS — Z882 Allergy status to sulfonamides status: Secondary | ICD-10-CM | POA: Diagnosis not present

## 2017-10-18 DIAGNOSIS — K219 Gastro-esophageal reflux disease without esophagitis: Secondary | ICD-10-CM | POA: Diagnosis not present

## 2017-10-18 DIAGNOSIS — R531 Weakness: Secondary | ICD-10-CM | POA: Diagnosis not present

## 2017-10-18 DIAGNOSIS — Z955 Presence of coronary angioplasty implant and graft: Secondary | ICD-10-CM | POA: Diagnosis not present

## 2017-10-18 NOTE — Telephone Encounter (Signed)
The pt has been advised to go to the ED for evaluation.  He has had 3 hours of diarrhea and gagging in the setting of recent post op heart surgery.  The pt agreed and will go to the ED in [place].

## 2017-10-18 NOTE — Telephone Encounter (Signed)
[facility] patient

## 2017-10-19 DIAGNOSIS — N179 Acute kidney failure, unspecified: Secondary | ICD-10-CM | POA: Diagnosis not present

## 2017-10-19 DIAGNOSIS — R197 Diarrhea, unspecified: Secondary | ICD-10-CM | POA: Diagnosis not present

## 2017-10-21 ENCOUNTER — Ambulatory Visit: Payer: Medicare Other | Admitting: Gastroenterology

## 2017-10-21 DIAGNOSIS — R911 Solitary pulmonary nodule: Secondary | ICD-10-CM | POA: Diagnosis not present

## 2017-10-21 DIAGNOSIS — R0602 Shortness of breath: Secondary | ICD-10-CM | POA: Diagnosis not present

## 2017-10-21 DIAGNOSIS — Z8701 Personal history of pneumonia (recurrent): Secondary | ICD-10-CM | POA: Diagnosis not present

## 2017-10-21 DIAGNOSIS — K219 Gastro-esophageal reflux disease without esophagitis: Secondary | ICD-10-CM | POA: Diagnosis not present

## 2017-10-21 DIAGNOSIS — Z87891 Personal history of nicotine dependence: Secondary | ICD-10-CM | POA: Diagnosis not present

## 2017-10-21 DIAGNOSIS — R9389 Abnormal findings on diagnostic imaging of other specified body structures: Secondary | ICD-10-CM | POA: Diagnosis not present

## 2017-10-22 DIAGNOSIS — F332 Major depressive disorder, recurrent severe without psychotic features: Secondary | ICD-10-CM | POA: Diagnosis not present

## 2017-10-24 DIAGNOSIS — E669 Obesity, unspecified: Secondary | ICD-10-CM | POA: Diagnosis not present

## 2017-10-24 DIAGNOSIS — I252 Old myocardial infarction: Secondary | ICD-10-CM | POA: Diagnosis not present

## 2017-10-24 DIAGNOSIS — I10 Essential (primary) hypertension: Secondary | ICD-10-CM | POA: Diagnosis not present

## 2017-10-24 DIAGNOSIS — E7800 Pure hypercholesterolemia, unspecified: Secondary | ICD-10-CM | POA: Diagnosis not present

## 2017-10-24 DIAGNOSIS — E785 Hyperlipidemia, unspecified: Secondary | ICD-10-CM | POA: Diagnosis not present

## 2017-10-24 DIAGNOSIS — Z955 Presence of coronary angioplasty implant and graft: Secondary | ICD-10-CM | POA: Diagnosis not present

## 2017-10-31 DIAGNOSIS — E7800 Pure hypercholesterolemia, unspecified: Secondary | ICD-10-CM | POA: Diagnosis not present

## 2017-10-31 DIAGNOSIS — I10 Essential (primary) hypertension: Secondary | ICD-10-CM | POA: Diagnosis not present

## 2017-10-31 DIAGNOSIS — E669 Obesity, unspecified: Secondary | ICD-10-CM | POA: Diagnosis not present

## 2017-10-31 DIAGNOSIS — I252 Old myocardial infarction: Secondary | ICD-10-CM | POA: Diagnosis not present

## 2017-10-31 DIAGNOSIS — F332 Major depressive disorder, recurrent severe without psychotic features: Secondary | ICD-10-CM | POA: Diagnosis not present

## 2017-10-31 DIAGNOSIS — E785 Hyperlipidemia, unspecified: Secondary | ICD-10-CM | POA: Diagnosis not present

## 2017-10-31 DIAGNOSIS — Z955 Presence of coronary angioplasty implant and graft: Secondary | ICD-10-CM | POA: Diagnosis not present

## 2017-11-04 ENCOUNTER — Encounter: Payer: Self-pay | Admitting: Gastroenterology

## 2017-11-04 ENCOUNTER — Ambulatory Visit (INDEPENDENT_AMBULATORY_CARE_PROVIDER_SITE_OTHER): Payer: Medicare Other | Admitting: Gastroenterology

## 2017-11-04 VITALS — BP 126/70 | HR 65 | Ht 72.0 in | Wt 315.0 lb

## 2017-11-04 DIAGNOSIS — K220 Achalasia of cardia: Secondary | ICD-10-CM

## 2017-11-04 DIAGNOSIS — R1310 Dysphagia, unspecified: Secondary | ICD-10-CM | POA: Diagnosis not present

## 2017-11-04 DIAGNOSIS — Z6841 Body Mass Index (BMI) 40.0 and over, adult: Secondary | ICD-10-CM | POA: Diagnosis not present

## 2017-11-04 DIAGNOSIS — K5909 Other constipation: Secondary | ICD-10-CM | POA: Diagnosis not present

## 2017-11-04 DIAGNOSIS — K219 Gastro-esophageal reflux disease without esophagitis: Secondary | ICD-10-CM | POA: Diagnosis not present

## 2017-11-04 DIAGNOSIS — I2510 Atherosclerotic heart disease of native coronary artery without angina pectoris: Secondary | ICD-10-CM | POA: Diagnosis not present

## 2017-11-04 DIAGNOSIS — K224 Dyskinesia of esophagus: Secondary | ICD-10-CM | POA: Diagnosis not present

## 2017-11-04 DIAGNOSIS — G4730 Sleep apnea, unspecified: Secondary | ICD-10-CM | POA: Diagnosis not present

## 2017-11-04 DIAGNOSIS — R1319 Other dysphagia: Secondary | ICD-10-CM

## 2017-11-04 MED ORDER — HYOSCYAMINE SULFATE 0.125 MG SL SUBL: 0.1250 mg | SUBLINGUAL_TABLET | Freq: Four times a day (QID) | SUBLINGUAL | 3 refills | Status: DC | PRN

## 2017-11-04 MED ORDER — DILTIAZEM HCL 30 MG PO TABS: 30.0000 mg | ORAL_TABLET | Freq: Two times a day (BID) | ORAL | 1 refills | Status: DC

## 2017-11-04 NOTE — Patient Instructions (Signed)
We have sent Diltiazem 30 mg to your pharmacy to take twice a day, if you start to fill dizzy with this medication please contact the office  We have also sent Levsin antispasmodic to your pharmacy  Take Peppermint oil 1 drop in 4 oz of liquid before meals  Use Benefiber 1 tablespoon twice daily      Constipation, Adult Constipation is when a person:  Poops (has a bowel movement) fewer times in a week than normal.  Has a hard time pooping.  Has poop that is dry, hard, or bigger than normal.  Follow these instructions at home: Eating and drinking   Eat foods that have a lot of fiber, such as: ? Fresh fruits and vegetables. ? Whole grains. ? Beans.  Eat less of foods that are high in fat, low in fiber, or overly processed, such as: ? [place] fries. ? Hamburgers. ? Cookies. ? Candy. ? Soda.  Drink enough fluid to keep your pee (urine) clear or pale yellow. General instructions  Exercise regularly or as told by your doctor.  Go to the restroom when you feel like you need to poop. Do not hold it in.  Take over-the-counter and prescription medicines only as told by your doctor. These include any fiber supplements.  Do pelvic floor retraining exercises, such as: ? Doing deep breathing while relaxing your lower belly (abdomen). ? Relaxing your pelvic floor while pooping.  Watch your condition for any changes.  Keep all follow-up visits as told by your doctor. This is important. Contact a doctor if:  You have pain that gets worse.  You have a fever.  You have not pooped for 4 days.  You throw up (vomit).  You are not hungry.  You lose weight.  You are bleeding from the anus.  You have thin, pencil-like poop (stool). Get help right away if:  You have a fever, and your symptoms suddenly get worse.  You leak poop or have blood in your poop.  Your belly feels hard or bigger than normal (is bloated).  You have very bad belly pain.  You feel dizzy or  you faint. This information is not intended to replace advice given to you by your health care provider. Make sure you discuss any questions you have with your health care provider. Document Released: 04/23/2008 Document Revised: 05/25/2016 Document Reviewed: 04/25/2016 Elsevier Interactive Patient Education  2017 [employer].

## 2017-11-04 NOTE — Progress Notes (Signed)
Sergio Griffin    [id number]    16-Nov-1951  Primary Care Physician:Calone, Ples Specter, FNP  Referring Physician: Golden Circle, [place], [place] [address]  Chief complaint:  Achalasia, dysphagia  HPI: 66 year old male morbidly obese, CAD with chronic dysphagia worse in the past 1-2 years here to discuss options for further management.  The patient had EGD July 2018 which did not show any abnormality to explain dysphagia symptoms, was empirically dilated to 16 mm with [facility] dilator.  He did not have any improvement.  He feels that food gets hung up in his chest and  if he drinks rapidly feels it is backing up and has to wait to let it pass, sometimes takes a few seconds to minutes.  He is on pantoprazole with no significant change, continues to have intermittent heartburn.  He has spasms in his chest, sometimes relieves with burping.  Esophageal manometry 08/2017 showed findings suggestive of EGJ outflow obstruction with poor bolus clearance, could not exclude spastic achalasia.   No Vomiting, abdominal pain, blood per rectum or weight loss.  Review of system positive for intermittent constipation with hard stool alternating with diarrhea. Colonoscopy March 2012 was normal   Outpatient Encounter Medications as of 11/04/2017  Medication Sig  . aspirin EC 81 MG tablet Take 162 mg by mouth daily.  Marland Kitchen atorvastatin (LIPITOR) 40 MG tablet Take 40 mg by mouth daily.  . clonazePAM (KLONOPIN) 0.5 MG tablet Take 1 tablet (0.5 mg total) by mouth 2 (two) times daily as needed for anxiety.  . furosemide (LASIX) 20 MG tablet Take 0.5 tablets (10 mg total) by mouth daily.  Marland Kitchen latanoprost (XALATAN) 0.005 % ophthalmic solution Place 1 drop into both eyes at bedtime.  Marland Kitchen losartan (COZAAR) 50 MG tablet Take 50 mg by mouth 2 (two) times daily.   . metoprolol tartrate (LOPRESSOR) 25 MG tablet Take 25 mg by mouth 2 (two) times daily.  . nitroGLYCERIN (NITROSTAT)  0.4 MG SL tablet Place 1 tablet (0.4 mg total) under the tongue every 5 (five) minutes x 3 doses as needed for chest pain.  . Omega-3 Fatty Acids (FISH OIL) 1000 MG CAPS Take 1,000 mg by mouth daily.  . pantoprazole (PROTONIX) 40 MG tablet Take 40 mg by mouth daily.  . ticagrelor (BRILINTA) 90 MG TABS tablet Take 90 mg by mouth 2 (two) times daily.  Marland Kitchen diltiazem (CARDIZEM) 30 MG tablet Take 1 tablet (30 mg total) by mouth 2 (two) times daily.  . hyoscyamine (LEVSIN SL) 0.125 MG SL tablet Place 1 tablet (0.125 mg total) under the tongue every 6 (six) hours as needed.   Facility-Administered Encounter Medications as of 11/04/2017  Medication  . 0.9 %  sodium chloride infusion    Allergies as of 11/04/2017 - Review Complete 11/04/2017  Allergen Reaction Noted  . Other Itching 06/05/2017  . Sulfa antibiotics Itching 06/10/2015    Past Medical History:  Diagnosis Date  . Anxiety   . Barrett's esophagus   . CAD (coronary artery disease)    PCI, stent 2011  . Cardiac arrhythmia   . Chicken pox   . Depression   . Diverticulosis   . Gastric ulcer   . GERD (gastroesophageal reflux disease)   . Glaucoma   . Hyperlipidemia   . Hypertension   . Kidney stones   . Pneumonia 11/2014  . Prostatitis   . Reflux   . UTI (lower  urinary tract infection)     Past Surgical History:  Procedure Laterality Date  . cardiac stents    . ESOPHAGEAL MANOMETRY N/A 09/11/2017   Procedure: ESOPHAGEAL MANOMETRY (EM);  Surgeon: Mauri Pole, MD;  Location: [facility];  Service: Endoscopy;  Laterality: N/A;  . EYE SURGERY Left    x 4  . EYE SURGERY Right    x 3    Family History  Problem Relation Age of Onset  . Arthritis Mother   . Hyperlipidemia Mother   . Stroke Mother   . Hypertension Mother   . Ovarian cancer Mother   . Hyperlipidemia Father   . Heart disease Father   . Hypertension Father   . Non-Hodgkin's lymphoma Father   . Hyperlipidemia Maternal Grandmother   . Heart disease  Maternal Grandmother   . Stroke Maternal Grandmother   . Hypertension Maternal Grandmother   . Diabetes Maternal Grandmother   . Hyperlipidemia Maternal Grandfather   . Heart disease Maternal Grandfather   . Hypertension Maternal Grandfather   . Heart disease Paternal Grandmother   . Heart disease Paternal Grandfather   . Stroke Paternal Grandfather   . Hypertension Paternal Grandfather   . Colon cancer Neg Hx   . Esophageal cancer Neg Hx   . Pancreatic cancer Neg Hx   . Stomach cancer Neg Hx   . Liver disease Neg Hx     Social History   Socioeconomic History  . Marital status: Divorced    Spouse name: Not on file  . Number of children: 2  . Years of education: 65  . Highest education level: Not on file  Social Needs  . Financial resource strain: Not on file  . Food insecurity - worry: Not on file  . Food insecurity - inability: Not on file  . Transportation needs - medical: Not on file  . Transportation needs - non-medical: Not on file  Occupational History  . Occupation: Estate manager/land agent    Comment: Retired  Tobacco Use  . Smoking status: Former Smoker    Types: Cigarettes  . Smokeless tobacco: Never Used  Substance and Sexual Activity  . Alcohol use: No  . Drug use: No  . Sexual activity: Not Currently    Partners: Female  Other Topics Concern  . Not on file  Social History Narrative   Fun: Paint, cook, travel   Denies religious beliefs effecting health care.       Review of systems: Review of Systems  Constitutional: Negative for fever and chills.  HENT: Negative.   Eyes: Negative for blurred vision.  Respiratory: Negative for cough, shortness of breath and wheezing.   Cardiovascular: Negative for chest pain and palpitations.  Gastrointestinal: as per HPI Genitourinary: Negative for dysuria, urgency, frequency and hematuria.  Musculoskeletal: Negative for myalgias, back pain and joint pain.  Skin: Negative for itching and rash.  Neurological:  Negative for dizziness, tremors, focal weakness, seizures and loss of consciousness.  Endo/Heme/Allergies: Negative for seasonal allergies.  Psychiatric/Behavioral: Negative for depression, suicidal ideas and hallucinations.  All other systems reviewed and are negative.   Physical Exam: Vitals:   11/04/17 1508  BP: 126/70  Pulse: 65   Body mass index is 42.72 kg/m. Gen:      No acute distress, morbidly obese HEENT:  EOMI, sclera anicteric Neck:     No masses; no thyromegaly Lungs:    Clear to auscultation bilaterally; normal respiratory effort CV:         Regular rate and  rhythm; no murmurs Abd:      + bowel sounds; soft, non-tender; no palpable masses, no distension Ext:    No edema; adequate peripheral perfusion Skin:      Warm and dry; no rash Neuro: alert and oriented x 3 Psych: normal mood and affect  Data Reviewed:  Reviewed labs, radiology imaging, old records and pertinent past GI work up   Assessment and Plan/Recommendations:  66 year old male with history of CAD, hypertension, CHF (EF 60%) based on echo July 2016, morbid obesity with worsening liquid and solid dysphagia. EGD July 2018 was unremarkable status post empiric dilation Esophageal manometry with EGJ outflow obstruction and possible spastic achalasia Discussed in detail the options (Botox injection versus pneumatic balloon dilation versus Heller myotomy) to relieve EGJ outflow obstruction He does not want to do a trial of Botox injection and he prefers not to undergo surgery at this point He would like to go with least invasive procedure  Patient is mostly bothered by spasms than trouble swallowing Try low-dose long-acting diltiazem 30 mg twice daily, advised patient to contact our office if he notices any dizziness or lightheadedness and recheck blood pressure in 1-2 weeks Levsin sublingual 1 tablet every 6 hours as needed Trial of peppermint oil (dilute 1-2 drops of pure peppermint oil in 4 ounce liquid  and drink it prior to meals) Continue antireflux measures, discussed lifestyle modification in detail  If above measures do not improve his symptoms,  will plan to proceed with pneumatic balloon dilation Discussed in detail the potential benefits and risks associated with the procedure  Constipation with alternating diarrhea: Start Benefiber 1 tablespoon 3 times daily with meals Increase fluid intake We will reassess at next visit if he continues to have persistent symptoms  Return in 1 month  K. Denzil Magnuson , MD [phone] Mon-Fri 8a-5p [phone] after 5p, weekends, holidays  CC: Golden Circle, FNP

## 2017-11-05 ENCOUNTER — Telehealth: Payer: Self-pay | Admitting: Gastroenterology

## 2017-11-05 DIAGNOSIS — E669 Obesity, unspecified: Secondary | ICD-10-CM | POA: Diagnosis not present

## 2017-11-05 DIAGNOSIS — I10 Essential (primary) hypertension: Secondary | ICD-10-CM | POA: Diagnosis not present

## 2017-11-05 DIAGNOSIS — I252 Old myocardial infarction: Secondary | ICD-10-CM | POA: Diagnosis not present

## 2017-11-05 DIAGNOSIS — Z955 Presence of coronary angioplasty implant and graft: Secondary | ICD-10-CM | POA: Diagnosis not present

## 2017-11-05 DIAGNOSIS — E7800 Pure hypercholesterolemia, unspecified: Secondary | ICD-10-CM | POA: Diagnosis not present

## 2017-11-05 DIAGNOSIS — E785 Hyperlipidemia, unspecified: Secondary | ICD-10-CM | POA: Diagnosis not present

## 2017-11-05 NOTE — Telephone Encounter (Signed)
Left message for pt that his levsin was called in at [facility] in [place] for a 90 day supply

## 2017-11-05 NOTE — Telephone Encounter (Signed)
Patient states his medication that was sent in yesterday to [facility] was a 30 day supply but if it is sent in as a 90 day supply he can get it cheaper. Pt requesting a new prescription

## 2017-11-07 DIAGNOSIS — E785 Hyperlipidemia, unspecified: Secondary | ICD-10-CM | POA: Diagnosis not present

## 2017-11-07 DIAGNOSIS — I252 Old myocardial infarction: Secondary | ICD-10-CM | POA: Diagnosis not present

## 2017-11-07 DIAGNOSIS — I10 Essential (primary) hypertension: Secondary | ICD-10-CM | POA: Diagnosis not present

## 2017-11-07 DIAGNOSIS — E7800 Pure hypercholesterolemia, unspecified: Secondary | ICD-10-CM | POA: Diagnosis not present

## 2017-11-07 DIAGNOSIS — Z955 Presence of coronary angioplasty implant and graft: Secondary | ICD-10-CM | POA: Diagnosis not present

## 2017-11-07 DIAGNOSIS — E669 Obesity, unspecified: Secondary | ICD-10-CM | POA: Diagnosis not present

## 2017-11-14 DIAGNOSIS — E785 Hyperlipidemia, unspecified: Secondary | ICD-10-CM | POA: Diagnosis not present

## 2017-11-14 DIAGNOSIS — E7800 Pure hypercholesterolemia, unspecified: Secondary | ICD-10-CM | POA: Diagnosis not present

## 2017-11-14 DIAGNOSIS — I10 Essential (primary) hypertension: Secondary | ICD-10-CM | POA: Diagnosis not present

## 2017-11-14 DIAGNOSIS — I252 Old myocardial infarction: Secondary | ICD-10-CM | POA: Diagnosis not present

## 2017-11-14 DIAGNOSIS — Z955 Presence of coronary angioplasty implant and graft: Secondary | ICD-10-CM | POA: Diagnosis not present

## 2017-11-14 DIAGNOSIS — E669 Obesity, unspecified: Secondary | ICD-10-CM | POA: Diagnosis not present

## 2017-11-20 DIAGNOSIS — E669 Obesity, unspecified: Secondary | ICD-10-CM | POA: Diagnosis not present

## 2017-11-20 DIAGNOSIS — E7800 Pure hypercholesterolemia, unspecified: Secondary | ICD-10-CM | POA: Diagnosis not present

## 2017-11-20 DIAGNOSIS — Z955 Presence of coronary angioplasty implant and graft: Secondary | ICD-10-CM | POA: Diagnosis not present

## 2017-11-20 DIAGNOSIS — E785 Hyperlipidemia, unspecified: Secondary | ICD-10-CM | POA: Diagnosis not present

## 2017-11-20 DIAGNOSIS — I10 Essential (primary) hypertension: Secondary | ICD-10-CM | POA: Diagnosis not present

## 2017-11-20 DIAGNOSIS — I252 Old myocardial infarction: Secondary | ICD-10-CM | POA: Diagnosis not present

## 2017-11-22 DIAGNOSIS — I10 Essential (primary) hypertension: Secondary | ICD-10-CM | POA: Diagnosis not present

## 2017-11-22 DIAGNOSIS — E669 Obesity, unspecified: Secondary | ICD-10-CM | POA: Diagnosis not present

## 2017-11-22 DIAGNOSIS — E785 Hyperlipidemia, unspecified: Secondary | ICD-10-CM | POA: Diagnosis not present

## 2017-11-22 DIAGNOSIS — I252 Old myocardial infarction: Secondary | ICD-10-CM | POA: Diagnosis not present

## 2017-11-22 DIAGNOSIS — Z955 Presence of coronary angioplasty implant and graft: Secondary | ICD-10-CM | POA: Diagnosis not present

## 2017-11-22 DIAGNOSIS — E7800 Pure hypercholesterolemia, unspecified: Secondary | ICD-10-CM | POA: Diagnosis not present

## 2017-11-25 DIAGNOSIS — I252 Old myocardial infarction: Secondary | ICD-10-CM | POA: Diagnosis not present

## 2017-11-25 DIAGNOSIS — I2510 Atherosclerotic heart disease of native coronary artery without angina pectoris: Secondary | ICD-10-CM | POA: Diagnosis not present

## 2017-11-25 DIAGNOSIS — E669 Obesity, unspecified: Secondary | ICD-10-CM | POA: Diagnosis not present

## 2017-11-25 DIAGNOSIS — Z955 Presence of coronary angioplasty implant and graft: Secondary | ICD-10-CM | POA: Diagnosis not present

## 2017-11-25 DIAGNOSIS — E782 Mixed hyperlipidemia: Secondary | ICD-10-CM | POA: Diagnosis not present

## 2017-11-25 DIAGNOSIS — E785 Hyperlipidemia, unspecified: Secondary | ICD-10-CM | POA: Diagnosis not present

## 2017-11-25 DIAGNOSIS — I10 Essential (primary) hypertension: Secondary | ICD-10-CM | POA: Diagnosis not present

## 2017-11-25 DIAGNOSIS — R5383 Other fatigue: Secondary | ICD-10-CM | POA: Diagnosis not present

## 2017-11-25 DIAGNOSIS — E7800 Pure hypercholesterolemia, unspecified: Secondary | ICD-10-CM | POA: Diagnosis not present

## 2017-12-02 DIAGNOSIS — Z955 Presence of coronary angioplasty implant and graft: Secondary | ICD-10-CM | POA: Diagnosis not present

## 2017-12-02 DIAGNOSIS — I252 Old myocardial infarction: Secondary | ICD-10-CM | POA: Diagnosis not present

## 2017-12-02 DIAGNOSIS — G4730 Sleep apnea, unspecified: Secondary | ICD-10-CM | POA: Diagnosis not present

## 2017-12-02 DIAGNOSIS — E669 Obesity, unspecified: Secondary | ICD-10-CM | POA: Diagnosis not present

## 2017-12-02 DIAGNOSIS — E785 Hyperlipidemia, unspecified: Secondary | ICD-10-CM | POA: Diagnosis not present

## 2017-12-02 DIAGNOSIS — I10 Essential (primary) hypertension: Secondary | ICD-10-CM | POA: Diagnosis not present

## 2017-12-02 DIAGNOSIS — E7800 Pure hypercholesterolemia, unspecified: Secondary | ICD-10-CM | POA: Diagnosis not present

## 2017-12-06 DIAGNOSIS — Z955 Presence of coronary angioplasty implant and graft: Secondary | ICD-10-CM | POA: Diagnosis not present

## 2017-12-06 DIAGNOSIS — I10 Essential (primary) hypertension: Secondary | ICD-10-CM | POA: Diagnosis not present

## 2017-12-06 DIAGNOSIS — E785 Hyperlipidemia, unspecified: Secondary | ICD-10-CM | POA: Diagnosis not present

## 2017-12-06 DIAGNOSIS — I252 Old myocardial infarction: Secondary | ICD-10-CM | POA: Diagnosis not present

## 2017-12-06 DIAGNOSIS — E669 Obesity, unspecified: Secondary | ICD-10-CM | POA: Diagnosis not present

## 2017-12-06 DIAGNOSIS — E7800 Pure hypercholesterolemia, unspecified: Secondary | ICD-10-CM | POA: Diagnosis not present

## 2017-12-10 DIAGNOSIS — Z125 Encounter for screening for malignant neoplasm of prostate: Secondary | ICD-10-CM | POA: Diagnosis not present

## 2017-12-10 DIAGNOSIS — R7302 Impaired glucose tolerance (oral): Secondary | ICD-10-CM | POA: Diagnosis not present

## 2017-12-10 DIAGNOSIS — I10 Essential (primary) hypertension: Secondary | ICD-10-CM | POA: Diagnosis not present

## 2017-12-10 DIAGNOSIS — K219 Gastro-esophageal reflux disease without esophagitis: Secondary | ICD-10-CM | POA: Diagnosis not present

## 2017-12-10 DIAGNOSIS — E785 Hyperlipidemia, unspecified: Secondary | ICD-10-CM | POA: Diagnosis not present

## 2017-12-10 DIAGNOSIS — N200 Calculus of kidney: Secondary | ICD-10-CM | POA: Diagnosis not present

## 2017-12-10 DIAGNOSIS — I2510 Atherosclerotic heart disease of native coronary artery without angina pectoris: Secondary | ICD-10-CM | POA: Diagnosis not present

## 2017-12-10 DIAGNOSIS — K5792 Diverticulitis of intestine, part unspecified, without perforation or abscess without bleeding: Secondary | ICD-10-CM | POA: Diagnosis not present

## 2017-12-12 DIAGNOSIS — Z6841 Body Mass Index (BMI) 40.0 and over, adult: Secondary | ICD-10-CM | POA: Diagnosis not present

## 2017-12-12 DIAGNOSIS — G4733 Obstructive sleep apnea (adult) (pediatric): Secondary | ICD-10-CM | POA: Diagnosis not present

## 2017-12-24 ENCOUNTER — Encounter: Payer: Self-pay | Admitting: Gastroenterology

## 2017-12-24 ENCOUNTER — Telehealth: Payer: Self-pay | Admitting: *Deleted

## 2017-12-24 ENCOUNTER — Ambulatory Visit (INDEPENDENT_AMBULATORY_CARE_PROVIDER_SITE_OTHER): Payer: Medicare Other | Admitting: Gastroenterology

## 2017-12-24 VITALS — BP 140/78 | HR 64 | Ht 70.0 in | Wt 306.6 lb

## 2017-12-24 DIAGNOSIS — Z8371 Family history of colonic polyps: Secondary | ICD-10-CM

## 2017-12-24 DIAGNOSIS — L989 Disorder of the skin and subcutaneous tissue, unspecified: Secondary | ICD-10-CM | POA: Diagnosis not present

## 2017-12-24 DIAGNOSIS — R1310 Dysphagia, unspecified: Secondary | ICD-10-CM | POA: Diagnosis not present

## 2017-12-24 DIAGNOSIS — R1319 Other dysphagia: Secondary | ICD-10-CM

## 2017-12-24 DIAGNOSIS — K224 Dyskinesia of esophagus: Secondary | ICD-10-CM | POA: Diagnosis not present

## 2017-12-24 DIAGNOSIS — R198 Other specified symptoms and signs involving the digestive system and abdomen: Secondary | ICD-10-CM

## 2017-12-24 NOTE — Patient Instructions (Addendum)
You have been scheduled for a colonoscopy. Please follow written instructions given to you at your visit today.  Please pick up your prep supplies at the pharmacy within the next 1-3 days. If you use inhalers (even only as needed), please bring them with you on the day of your procedure.We will refer you to [facility] dermatology and contact you with that appointment    Continue Diltiazem   You will be contacted by our office prior to your procedure for directions on holding your Brilinta .  If you do not hear from our office 1 week prior to your scheduled procedure, please call [phone] to discuss.

## 2017-12-24 NOTE — Telephone Encounter (Signed)
  12/24/2017   RE: Sergio Griffin DOB: [birth year] MRN: [id number]   Dear  Dr Sabra Heck,  We have scheduled the above patient for an endoscopic procedure. Our records show that he is on anticoagulation therapy.   Please advise as to how long the patient may come off his therapy of Brilinta prior to the procedure, which is scheduled for 01/15/2018.  Please fax back/ or route the completed form to Tonita Phoenix at [phone].   Sincerely,    Genella Mech , CMA AAMA

## 2017-12-24 NOTE — Progress Notes (Signed)
Sergio Griffin    [id number]    11-18-1951  Primary Care Physician:Calone, Ples Specter, FNP  Referring Physician: Golden Circle, [place], [place] [address]  Chief complaint: Esophageal spasms, dysphagia  HPI: 67 year old male with morbid obesity, CAD status post PCI on Brilinta, OSA here with follow-up visit of dysphagia. Dysphagia has improved significantly with Diltiazem. He had very few episodes since last visit, most when he tries to drink something cold or with hot soup. Warm drinks like coffee doesn't have an significant effect.  He is using peppermint as needed with relief of spasms.  He no longer feels the food gets hung up with every meal.  He has modified his diet significantly and is avoiding fast foods, is trying to lose weight.  He is no longer having heartburn and has discontinued PPI.  Patient complains of change in bowel habits.  He is noticing change in stool color occasionally is very pale color but does not last for more than a day or 2.  Denies any dark urine or change in eye color.  Last colonoscopy in 2012, was normal.  He was recommended to undergo repeat colonoscopy in 5 years.  Patient is requesting to schedule colonoscopy.   Outpatient Encounter Medications as of 12/24/2017  Medication Sig  . aspirin EC 81 MG tablet Take 81 mg by mouth daily.   Marland Kitchen atorvastatin (LIPITOR) 40 MG tablet Take 40 mg by mouth daily.  . clonazePAM (KLONOPIN) 0.5 MG tablet Take 1 tablet (0.5 mg total) by mouth 2 (two) times daily as needed for anxiety.  Marland Kitchen diltiazem (CARDIZEM) 30 MG tablet Take 1 tablet (30 mg total) by mouth 2 (two) times daily.  Marland Kitchen ezetimibe (ZETIA) 10 MG tablet Take 10 mg by mouth daily.  . furosemide (LASIX) 20 MG tablet Take 0.5 tablets (10 mg total) by mouth daily. (Patient taking differently: Take 10 mg by mouth daily. As needed)  . latanoprost (XALATAN) 0.005 % ophthalmic solution Place 1 drop into both eyes at bedtime.  Marland Kitchen  losartan (COZAAR) 50 MG tablet Take 50 mg by mouth 2 (two) times daily.   . metoprolol tartrate (LOPRESSOR) 25 MG tablet Take 25 mg by mouth 2 (two) times daily.  . nitroGLYCERIN (NITROSTAT) 0.4 MG SL tablet Place 1 tablet (0.4 mg total) under the tongue every 5 (five) minutes x 3 doses as needed for chest pain.  . Omega-3 Fatty Acids (FISH OIL) 1000 MG CAPS Take 1,000 mg by mouth daily.  . ticagrelor (BRILINTA) 90 MG TABS tablet Take 90 mg by mouth 2 (two) times daily.  . [DISCONTINUED] hyoscyamine (LEVSIN SL) 0.125 MG SL tablet Place 1 tablet (0.125 mg total) under the tongue every 6 (six) hours as needed. (Patient not taking: Reported on 12/24/2017)  . [DISCONTINUED] pantoprazole (PROTONIX) 40 MG tablet Take 40 mg by mouth daily.   Facility-Administered Encounter Medications as of 12/24/2017  Medication  . 0.9 %  sodium chloride infusion    Allergies as of 12/24/2017 - Review Complete 12/24/2017  Allergen Reaction Noted  . Other Itching 06/05/2017  . Sulfa antibiotics Itching 06/10/2015    Past Medical History:  Diagnosis Date  . Anxiety   . Barrett's esophagus   . CAD (coronary artery disease)    PCI, stent 2011  . Cardiac arrhythmia   . Chicken pox   . Depression   . Diverticulosis   . Gastric ulcer   .  GERD (gastroesophageal reflux disease)   . Glaucoma   . Hyperlipidemia   . Hypertension   . Kidney stones   . Pneumonia 11/2014  . Prostatitis   . Reflux   . UTI (lower urinary tract infection)     Past Surgical History:  Procedure Laterality Date  . cardiac stents    . ESOPHAGEAL MANOMETRY N/A 09/11/2017   Procedure: ESOPHAGEAL MANOMETRY (EM);  Surgeon: Mauri Pole, MD;  Location: [facility];  Service: Endoscopy;  Laterality: N/A;  . EYE SURGERY Left    x 4  . EYE SURGERY Right    x 3    Family History  Problem Relation Age of Onset  . Arthritis Mother   . Hyperlipidemia Mother   . Stroke Mother   . Hypertension Mother   . Ovarian cancer Mother   .  Hyperlipidemia Father   . Heart disease Father   . Hypertension Father   . Non-Hodgkin's lymphoma Father   . Hyperlipidemia Maternal Grandmother   . Heart disease Maternal Grandmother   . Stroke Maternal Grandmother   . Hypertension Maternal Grandmother   . Diabetes Maternal Grandmother   . Hyperlipidemia Maternal Grandfather   . Heart disease Maternal Grandfather   . Hypertension Maternal Grandfather   . Heart disease Paternal Grandmother   . Heart disease Paternal Grandfather   . Stroke Paternal Grandfather   . Hypertension Paternal Grandfather   . Colon cancer Neg Hx   . Esophageal cancer Neg Hx   . Pancreatic cancer Neg Hx   . Stomach cancer Neg Hx   . Liver disease Neg Hx     Social History   Socioeconomic History  . Marital status: Divorced    Spouse name: Not on file  . Number of children: 2  . Years of education: 50  . Highest education level: Not on file  Social Needs  . Financial resource strain: Not on file  . Food insecurity - worry: Not on file  . Food insecurity - inability: Not on file  . Transportation needs - medical: Not on file  . Transportation needs - non-medical: Not on file  Occupational History  . Occupation: Estate manager/land agent    Comment: Retired  Tobacco Use  . Smoking status: Former Smoker    Types: Cigarettes  . Smokeless tobacco: Never Used  Substance and Sexual Activity  . Alcohol use: No  . Drug use: No  . Sexual activity: Not Currently    Partners: Female  Other Topics Concern  . Not on file  Social History Narrative   Fun: Paint, cook, travel   Denies religious beliefs effecting health care.       Review of systems: Review of Systems  Constitutional: Negative for fever and chills. Positive for lack of energy HENT: Positive for sinus problem.   Eyes: Negative for blurred vision.  Respiratory: Negative for cough, positive shortness of breath and wheezing.  ?  OSA Cardiovascular: Negative for chest pain and palpitations.    Gastrointestinal: as per HPI Genitourinary: Negative for dysuria, urgency, frequency and hematuria.  Musculoskeletal: Negative for myalgias, back pain and joint pain.  Skin: Negative for itching and rash.  Neurological: Negative for dizziness, tremors, focal weakness, seizures and loss of consciousness.  Endo/Heme/Allergies: Positive for seasonal allergies.  Psychiatric/Behavioral: Negative for  suicidal ideas and hallucinations. Positive for depression and anxiety All other systems reviewed and are negative.   Physical Exam: Vitals:   12/24/17 0848  BP: 140/78  Pulse: 64   Body  mass index is 43.99 kg/m. Gen:      No acute distress HEENT:  EOMI, sclera anicteric Neck:     No masses; no thyromegaly Lungs:    Clear to auscultation bilaterally; normal respiratory effort CV:         Regular rate and rhythm; no murmurs Abd:      + bowel sounds; soft, non-tender; no palpable masses, no distension Ext:    No edema; adequate peripheral perfusion Skin:      Warm and dry; no rash Neuro: alert and oriented x 3 Psych: normal mood and affect  Data Reviewed:  Reviewed labs, radiology imaging, old records and pertinent past GI work up  Colonoscopy February 07, 2011 by Dr. Docia Furl: Extensive diverticulosis, sigmoid lipoma, was recommended recall colonoscopy in 5 years  Assessment and Plan/Recommendations:  30 yr M with obesity, CAD on Brilinta s/p PCI 08/2017 with chronic dysphagia here for follow up Esophageal manometry features suggestive for EG junction outflow obstruction, with poor bolus clearance and couldn't exclude possible spastic achalasia.  He has noticed significant improvement of dysphagia with Dilatiazem 30mg  BID. He is having occasional sporadic episodes.  He is also on Lopressor and Losartan with HR 50's, limiting the possibility to further increase the dose of Dilatiazem.  Will hold off Botox injection at EG junction or pneumatic ballooon dilation at this point given  improvement of symptoms  Patient has family history of colon polyps in Brother, he was recommended repeat colonoscopy in 5 years by Dr Donnajean Lopes in 2012. He also complains of change in bowel habits and color, most likely secondary to recent change in his diet, is eating more fruits, vegetables and whole grains.   He is requesting to schedule colonoscopy . Will discuss with Cardiology if ok to hold Brilinta prior to the procedure  He has nonhealing skin lesion with scab on his forehead, refer to dermatology for biopsy  He had episodes of epistaxis, advised patient to contact PMD if he has recurrent episode, may need evaluation by ENT    K. Denzil Magnuson , MD [phone] Mon-Fri 8a-5p [phone] after 5p, weekends, holidays  CC: Golden Circle, FNP

## 2017-12-25 NOTE — Telephone Encounter (Signed)
Per patients doctor  Dr  Sabra Heck in [place] patient can not come off blood thinner because of recent PCI in 08/2017    Has colonoscopy scheduled on 2/27 what do we need to do?

## 2017-12-25 NOTE — Telephone Encounter (Signed)
Please inform patient, will need to reschedule the procedure once it will be safe for him to hold Brilinta, likely after Oct 2019. Thanks

## 2017-12-25 NOTE — Telephone Encounter (Signed)
Spoke with patient and explained to him he could not come off his blood thinner so we are cancelling his procedure, We will put a colonoscopy recall in for him for October 2019

## 2017-12-30 DIAGNOSIS — I209 Angina pectoris, unspecified: Secondary | ICD-10-CM | POA: Diagnosis not present

## 2017-12-30 DIAGNOSIS — I10 Essential (primary) hypertension: Secondary | ICD-10-CM | POA: Diagnosis not present

## 2017-12-31 DIAGNOSIS — C44319 Basal cell carcinoma of skin of other parts of face: Secondary | ICD-10-CM | POA: Diagnosis not present

## 2017-12-31 DIAGNOSIS — Z961 Presence of intraocular lens: Secondary | ICD-10-CM | POA: Diagnosis not present

## 2017-12-31 DIAGNOSIS — H04123 Dry eye syndrome of bilateral lacrimal glands: Secondary | ICD-10-CM | POA: Diagnosis not present

## 2017-12-31 DIAGNOSIS — H401132 Primary open-angle glaucoma, bilateral, moderate stage: Secondary | ICD-10-CM | POA: Diagnosis not present

## 2017-12-31 DIAGNOSIS — L218 Other seborrheic dermatitis: Secondary | ICD-10-CM | POA: Diagnosis not present

## 2017-12-31 DIAGNOSIS — D485 Neoplasm of uncertain behavior of skin: Secondary | ICD-10-CM | POA: Diagnosis not present

## 2017-12-31 DIAGNOSIS — H26492 Other secondary cataract, left eye: Secondary | ICD-10-CM | POA: Diagnosis not present

## 2017-12-31 DIAGNOSIS — I872 Venous insufficiency (chronic) (peripheral): Secondary | ICD-10-CM | POA: Diagnosis not present

## 2017-12-31 DIAGNOSIS — H524 Presbyopia: Secondary | ICD-10-CM | POA: Diagnosis not present

## 2018-01-02 DIAGNOSIS — F332 Major depressive disorder, recurrent severe without psychotic features: Secondary | ICD-10-CM | POA: Diagnosis not present

## 2018-01-09 DIAGNOSIS — F332 Major depressive disorder, recurrent severe without psychotic features: Secondary | ICD-10-CM | POA: Diagnosis not present

## 2018-01-14 ENCOUNTER — Encounter: Payer: Medicare Other | Admitting: Gastroenterology

## 2018-01-15 ENCOUNTER — Encounter: Payer: Medicare Other | Admitting: Gastroenterology

## 2018-01-16 DIAGNOSIS — F332 Major depressive disorder, recurrent severe without psychotic features: Secondary | ICD-10-CM | POA: Diagnosis not present

## 2018-01-20 DIAGNOSIS — G4733 Obstructive sleep apnea (adult) (pediatric): Secondary | ICD-10-CM | POA: Diagnosis not present

## 2018-01-22 DIAGNOSIS — C44319 Basal cell carcinoma of skin of other parts of face: Secondary | ICD-10-CM | POA: Diagnosis not present

## 2018-01-27 DIAGNOSIS — E785 Hyperlipidemia, unspecified: Secondary | ICD-10-CM | POA: Diagnosis not present

## 2018-01-29 DIAGNOSIS — I10 Essential (primary) hypertension: Secondary | ICD-10-CM | POA: Diagnosis not present

## 2018-01-29 DIAGNOSIS — G4733 Obstructive sleep apnea (adult) (pediatric): Secondary | ICD-10-CM | POA: Diagnosis not present

## 2018-01-29 DIAGNOSIS — Z6841 Body Mass Index (BMI) 40.0 and over, adult: Secondary | ICD-10-CM | POA: Diagnosis not present

## 2018-01-30 DIAGNOSIS — F332 Major depressive disorder, recurrent severe without psychotic features: Secondary | ICD-10-CM | POA: Diagnosis not present

## 2018-02-13 DIAGNOSIS — H401133 Primary open-angle glaucoma, bilateral, severe stage: Secondary | ICD-10-CM | POA: Diagnosis not present

## 2018-02-25 DIAGNOSIS — F332 Major depressive disorder, recurrent severe without psychotic features: Secondary | ICD-10-CM | POA: Diagnosis not present

## 2018-02-28 DIAGNOSIS — I2510 Atherosclerotic heart disease of native coronary artery without angina pectoris: Secondary | ICD-10-CM | POA: Diagnosis not present

## 2018-02-28 DIAGNOSIS — I10 Essential (primary) hypertension: Secondary | ICD-10-CM | POA: Diagnosis not present

## 2018-02-28 DIAGNOSIS — R600 Localized edema: Secondary | ICD-10-CM | POA: Diagnosis not present

## 2018-02-28 DIAGNOSIS — E782 Mixed hyperlipidemia: Secondary | ICD-10-CM | POA: Diagnosis not present

## 2018-03-04 DIAGNOSIS — Z85828 Personal history of other malignant neoplasm of skin: Secondary | ICD-10-CM | POA: Diagnosis not present

## 2018-03-04 DIAGNOSIS — I872 Venous insufficiency (chronic) (peripheral): Secondary | ICD-10-CM | POA: Diagnosis not present

## 2018-03-04 DIAGNOSIS — D485 Neoplasm of uncertain behavior of skin: Secondary | ICD-10-CM | POA: Diagnosis not present

## 2018-03-04 DIAGNOSIS — L218 Other seborrheic dermatitis: Secondary | ICD-10-CM | POA: Diagnosis not present

## 2018-03-04 DIAGNOSIS — D225 Melanocytic nevi of trunk: Secondary | ICD-10-CM | POA: Diagnosis not present

## 2018-03-04 DIAGNOSIS — D235 Other benign neoplasm of skin of trunk: Secondary | ICD-10-CM | POA: Diagnosis not present

## 2018-03-04 DIAGNOSIS — D224 Melanocytic nevi of scalp and neck: Secondary | ICD-10-CM | POA: Diagnosis not present

## 2018-03-04 DIAGNOSIS — D234 Other benign neoplasm of skin of scalp and neck: Secondary | ICD-10-CM | POA: Diagnosis not present

## 2018-03-04 DIAGNOSIS — D2371 Other benign neoplasm of skin of right lower limb, including hip: Secondary | ICD-10-CM | POA: Diagnosis not present

## 2018-03-04 DIAGNOSIS — L821 Other seborrheic keratosis: Secondary | ICD-10-CM | POA: Diagnosis not present

## 2018-03-04 DIAGNOSIS — Z1283 Encounter for screening for malignant neoplasm of skin: Secondary | ICD-10-CM | POA: Diagnosis not present

## 2018-03-04 DIAGNOSIS — Z08 Encounter for follow-up examination after completed treatment for malignant neoplasm: Secondary | ICD-10-CM | POA: Diagnosis not present

## 2018-03-04 DIAGNOSIS — L720 Epidermal cyst: Secondary | ICD-10-CM | POA: Diagnosis not present

## 2018-03-04 DIAGNOSIS — L918 Other hypertrophic disorders of the skin: Secondary | ICD-10-CM | POA: Diagnosis not present

## 2018-03-12 DIAGNOSIS — F332 Major depressive disorder, recurrent severe without psychotic features: Secondary | ICD-10-CM | POA: Diagnosis not present

## 2018-03-17 DIAGNOSIS — J0100 Acute maxillary sinusitis, unspecified: Secondary | ICD-10-CM | POA: Diagnosis not present

## 2018-03-17 DIAGNOSIS — G4733 Obstructive sleep apnea (adult) (pediatric): Secondary | ICD-10-CM | POA: Diagnosis not present

## 2018-03-17 DIAGNOSIS — R0981 Nasal congestion: Secondary | ICD-10-CM | POA: Diagnosis not present

## 2018-03-17 DIAGNOSIS — R040 Epistaxis: Secondary | ICD-10-CM | POA: Diagnosis not present

## 2018-03-17 DIAGNOSIS — Z9989 Dependence on other enabling machines and devices: Secondary | ICD-10-CM | POA: Diagnosis not present

## 2018-03-17 DIAGNOSIS — J342 Deviated nasal septum: Secondary | ICD-10-CM | POA: Diagnosis not present

## 2018-03-25 DIAGNOSIS — G4700 Insomnia, unspecified: Secondary | ICD-10-CM | POA: Diagnosis not present

## 2018-03-25 DIAGNOSIS — G4733 Obstructive sleep apnea (adult) (pediatric): Secondary | ICD-10-CM | POA: Diagnosis not present

## 2018-03-25 DIAGNOSIS — Z6841 Body Mass Index (BMI) 40.0 and over, adult: Secondary | ICD-10-CM | POA: Diagnosis not present

## 2018-03-26 DIAGNOSIS — S134XXA Sprain of ligaments of cervical spine, initial encounter: Secondary | ICD-10-CM | POA: Diagnosis not present

## 2018-03-26 DIAGNOSIS — F332 Major depressive disorder, recurrent severe without psychotic features: Secondary | ICD-10-CM | POA: Diagnosis not present

## 2018-03-26 DIAGNOSIS — M9901 Segmental and somatic dysfunction of cervical region: Secondary | ICD-10-CM | POA: Diagnosis not present

## 2018-03-26 DIAGNOSIS — M9903 Segmental and somatic dysfunction of lumbar region: Secondary | ICD-10-CM | POA: Diagnosis not present

## 2018-03-26 DIAGNOSIS — S335XXA Sprain of ligaments of lumbar spine, initial encounter: Secondary | ICD-10-CM | POA: Diagnosis not present

## 2018-03-26 DIAGNOSIS — S233XXA Sprain of ligaments of thoracic spine, initial encounter: Secondary | ICD-10-CM | POA: Diagnosis not present

## 2018-03-26 DIAGNOSIS — M9902 Segmental and somatic dysfunction of thoracic region: Secondary | ICD-10-CM | POA: Diagnosis not present

## 2018-03-28 DIAGNOSIS — M9901 Segmental and somatic dysfunction of cervical region: Secondary | ICD-10-CM | POA: Diagnosis not present

## 2018-03-28 DIAGNOSIS — S233XXA Sprain of ligaments of thoracic spine, initial encounter: Secondary | ICD-10-CM | POA: Diagnosis not present

## 2018-03-28 DIAGNOSIS — S134XXA Sprain of ligaments of cervical spine, initial encounter: Secondary | ICD-10-CM | POA: Diagnosis not present

## 2018-03-28 DIAGNOSIS — M9903 Segmental and somatic dysfunction of lumbar region: Secondary | ICD-10-CM | POA: Diagnosis not present

## 2018-03-28 DIAGNOSIS — S335XXA Sprain of ligaments of lumbar spine, initial encounter: Secondary | ICD-10-CM | POA: Diagnosis not present

## 2018-03-28 DIAGNOSIS — M9902 Segmental and somatic dysfunction of thoracic region: Secondary | ICD-10-CM | POA: Diagnosis not present

## 2018-04-01 DIAGNOSIS — M9901 Segmental and somatic dysfunction of cervical region: Secondary | ICD-10-CM | POA: Diagnosis not present

## 2018-04-01 DIAGNOSIS — S134XXA Sprain of ligaments of cervical spine, initial encounter: Secondary | ICD-10-CM | POA: Diagnosis not present

## 2018-04-01 DIAGNOSIS — S233XXA Sprain of ligaments of thoracic spine, initial encounter: Secondary | ICD-10-CM | POA: Diagnosis not present

## 2018-04-01 DIAGNOSIS — M9902 Segmental and somatic dysfunction of thoracic region: Secondary | ICD-10-CM | POA: Diagnosis not present

## 2018-04-01 DIAGNOSIS — M9903 Segmental and somatic dysfunction of lumbar region: Secondary | ICD-10-CM | POA: Diagnosis not present

## 2018-04-01 DIAGNOSIS — S335XXA Sprain of ligaments of lumbar spine, initial encounter: Secondary | ICD-10-CM | POA: Diagnosis not present

## 2018-04-03 DIAGNOSIS — M9901 Segmental and somatic dysfunction of cervical region: Secondary | ICD-10-CM | POA: Diagnosis not present

## 2018-04-03 DIAGNOSIS — M9902 Segmental and somatic dysfunction of thoracic region: Secondary | ICD-10-CM | POA: Diagnosis not present

## 2018-04-03 DIAGNOSIS — M9903 Segmental and somatic dysfunction of lumbar region: Secondary | ICD-10-CM | POA: Diagnosis not present

## 2018-04-03 DIAGNOSIS — S134XXA Sprain of ligaments of cervical spine, initial encounter: Secondary | ICD-10-CM | POA: Diagnosis not present

## 2018-04-03 DIAGNOSIS — S233XXA Sprain of ligaments of thoracic spine, initial encounter: Secondary | ICD-10-CM | POA: Diagnosis not present

## 2018-04-03 DIAGNOSIS — S335XXA Sprain of ligaments of lumbar spine, initial encounter: Secondary | ICD-10-CM | POA: Diagnosis not present

## 2018-04-04 DIAGNOSIS — F332 Major depressive disorder, recurrent severe without psychotic features: Secondary | ICD-10-CM | POA: Diagnosis not present

## 2018-04-04 DIAGNOSIS — M9903 Segmental and somatic dysfunction of lumbar region: Secondary | ICD-10-CM | POA: Diagnosis not present

## 2018-04-04 DIAGNOSIS — F410 Panic disorder [episodic paroxysmal anxiety] without agoraphobia: Secondary | ICD-10-CM | POA: Diagnosis not present

## 2018-04-04 DIAGNOSIS — S134XXA Sprain of ligaments of cervical spine, initial encounter: Secondary | ICD-10-CM | POA: Diagnosis not present

## 2018-04-04 DIAGNOSIS — M9901 Segmental and somatic dysfunction of cervical region: Secondary | ICD-10-CM | POA: Diagnosis not present

## 2018-04-04 DIAGNOSIS — S335XXA Sprain of ligaments of lumbar spine, initial encounter: Secondary | ICD-10-CM | POA: Diagnosis not present

## 2018-04-04 DIAGNOSIS — S233XXA Sprain of ligaments of thoracic spine, initial encounter: Secondary | ICD-10-CM | POA: Diagnosis not present

## 2018-04-04 DIAGNOSIS — M9902 Segmental and somatic dysfunction of thoracic region: Secondary | ICD-10-CM | POA: Diagnosis not present

## 2018-04-07 DIAGNOSIS — J0141 Acute recurrent pansinusitis: Secondary | ICD-10-CM | POA: Diagnosis not present

## 2018-04-07 DIAGNOSIS — R0981 Nasal congestion: Secondary | ICD-10-CM | POA: Diagnosis not present

## 2018-04-07 DIAGNOSIS — J342 Deviated nasal septum: Secondary | ICD-10-CM | POA: Diagnosis not present

## 2018-04-07 DIAGNOSIS — G4733 Obstructive sleep apnea (adult) (pediatric): Secondary | ICD-10-CM | POA: Diagnosis not present

## 2018-04-07 DIAGNOSIS — R040 Epistaxis: Secondary | ICD-10-CM | POA: Diagnosis not present

## 2018-04-07 DIAGNOSIS — Z9989 Dependence on other enabling machines and devices: Secondary | ICD-10-CM | POA: Diagnosis not present

## 2018-04-07 DIAGNOSIS — J343 Hypertrophy of nasal turbinates: Secondary | ICD-10-CM | POA: Diagnosis not present

## 2018-04-10 ENCOUNTER — Encounter: Payer: Self-pay | Admitting: *Deleted

## 2018-04-10 DIAGNOSIS — F332 Major depressive disorder, recurrent severe without psychotic features: Secondary | ICD-10-CM | POA: Diagnosis not present

## 2018-04-11 DIAGNOSIS — R0981 Nasal congestion: Secondary | ICD-10-CM | POA: Diagnosis not present

## 2018-04-11 DIAGNOSIS — J0141 Acute recurrent pansinusitis: Secondary | ICD-10-CM | POA: Diagnosis not present

## 2018-04-24 DIAGNOSIS — F332 Major depressive disorder, recurrent severe without psychotic features: Secondary | ICD-10-CM | POA: Diagnosis not present

## 2018-05-08 DIAGNOSIS — F332 Major depressive disorder, recurrent severe without psychotic features: Secondary | ICD-10-CM | POA: Diagnosis not present

## 2018-05-09 DIAGNOSIS — F332 Major depressive disorder, recurrent severe without psychotic features: Secondary | ICD-10-CM | POA: Diagnosis not present

## 2018-05-16 DIAGNOSIS — I10 Essential (primary) hypertension: Secondary | ICD-10-CM | POA: Diagnosis not present

## 2018-05-16 DIAGNOSIS — F458 Other somatoform disorders: Secondary | ICD-10-CM | POA: Diagnosis not present

## 2018-05-16 DIAGNOSIS — G4731 Primary central sleep apnea: Secondary | ICD-10-CM | POA: Diagnosis not present

## 2018-05-29 DIAGNOSIS — G4731 Primary central sleep apnea: Secondary | ICD-10-CM | POA: Diagnosis not present

## 2018-05-30 DIAGNOSIS — F332 Major depressive disorder, recurrent severe without psychotic features: Secondary | ICD-10-CM | POA: Diagnosis not present

## 2018-06-06 DIAGNOSIS — F332 Major depressive disorder, recurrent severe without psychotic features: Secondary | ICD-10-CM | POA: Diagnosis not present

## 2018-06-13 ENCOUNTER — Ambulatory Visit: Payer: Medicare Other | Admitting: Gastroenterology

## 2018-06-18 DIAGNOSIS — F332 Major depressive disorder, recurrent severe without psychotic features: Secondary | ICD-10-CM | POA: Diagnosis not present

## 2018-06-22 DIAGNOSIS — J029 Acute pharyngitis, unspecified: Secondary | ICD-10-CM | POA: Diagnosis not present

## 2018-06-22 DIAGNOSIS — G4731 Primary central sleep apnea: Secondary | ICD-10-CM | POA: Diagnosis not present

## 2018-06-22 DIAGNOSIS — K0889 Other specified disorders of teeth and supporting structures: Secondary | ICD-10-CM | POA: Diagnosis not present

## 2018-06-22 DIAGNOSIS — G501 Atypical facial pain: Secondary | ICD-10-CM | POA: Diagnosis not present

## 2018-06-23 DIAGNOSIS — F332 Major depressive disorder, recurrent severe without psychotic features: Secondary | ICD-10-CM | POA: Diagnosis not present

## 2018-06-27 DIAGNOSIS — R42 Dizziness and giddiness: Secondary | ICD-10-CM | POA: Diagnosis not present

## 2018-06-27 DIAGNOSIS — R001 Bradycardia, unspecified: Secondary | ICD-10-CM | POA: Diagnosis not present

## 2018-06-27 DIAGNOSIS — I2510 Atherosclerotic heart disease of native coronary artery without angina pectoris: Secondary | ICD-10-CM | POA: Diagnosis not present

## 2018-06-27 DIAGNOSIS — I10 Essential (primary) hypertension: Secondary | ICD-10-CM | POA: Diagnosis not present

## 2018-06-27 DIAGNOSIS — E782 Mixed hyperlipidemia: Secondary | ICD-10-CM | POA: Diagnosis not present

## 2018-06-27 DIAGNOSIS — I444 Left anterior fascicular block: Secondary | ICD-10-CM | POA: Diagnosis not present

## 2018-06-27 DIAGNOSIS — R9431 Abnormal electrocardiogram [ECG] [EKG]: Secondary | ICD-10-CM | POA: Diagnosis not present

## 2018-06-30 DIAGNOSIS — F332 Major depressive disorder, recurrent severe without psychotic features: Secondary | ICD-10-CM | POA: Diagnosis not present

## 2018-07-02 DIAGNOSIS — F458 Other somatoform disorders: Secondary | ICD-10-CM | POA: Diagnosis not present

## 2018-07-02 DIAGNOSIS — G4731 Primary central sleep apnea: Secondary | ICD-10-CM | POA: Diagnosis not present

## 2018-07-02 DIAGNOSIS — R42 Dizziness and giddiness: Secondary | ICD-10-CM | POA: Diagnosis not present

## 2018-07-11 DIAGNOSIS — F332 Major depressive disorder, recurrent severe without psychotic features: Secondary | ICD-10-CM | POA: Diagnosis not present

## 2018-07-16 ENCOUNTER — Encounter: Payer: Self-pay | Admitting: Gastroenterology

## 2018-07-24 DIAGNOSIS — F332 Major depressive disorder, recurrent severe without psychotic features: Secondary | ICD-10-CM | POA: Diagnosis not present

## 2018-07-31 DIAGNOSIS — F332 Major depressive disorder, recurrent severe without psychotic features: Secondary | ICD-10-CM | POA: Diagnosis not present

## 2018-08-14 DIAGNOSIS — F332 Major depressive disorder, recurrent severe without psychotic features: Secondary | ICD-10-CM | POA: Diagnosis not present

## 2018-08-22 ENCOUNTER — Other Ambulatory Visit: Payer: Self-pay

## 2018-08-22 MED ORDER — BUPROPION HCL ER (XL) 300 MG PO TB24: 300.0000 mg | ORAL_TABLET | ORAL | 1 refills | Status: DC

## 2018-09-03 ENCOUNTER — Ambulatory Visit (INDEPENDENT_AMBULATORY_CARE_PROVIDER_SITE_OTHER): Payer: Medicare Other | Admitting: Gastroenterology

## 2018-09-03 ENCOUNTER — Other Ambulatory Visit (INDEPENDENT_AMBULATORY_CARE_PROVIDER_SITE_OTHER): Payer: Medicare Other

## 2018-09-03 ENCOUNTER — Encounter: Payer: Self-pay | Admitting: Gastroenterology

## 2018-09-03 VITALS — BP 110/66 | HR 72 | Ht 70.0 in | Wt 269.0 lb

## 2018-09-03 DIAGNOSIS — Z7902 Long term (current) use of antithrombotics/antiplatelets: Secondary | ICD-10-CM | POA: Diagnosis not present

## 2018-09-03 DIAGNOSIS — R110 Nausea: Secondary | ICD-10-CM

## 2018-09-03 DIAGNOSIS — R197 Diarrhea, unspecified: Secondary | ICD-10-CM

## 2018-09-03 DIAGNOSIS — R1084 Generalized abdominal pain: Secondary | ICD-10-CM | POA: Diagnosis not present

## 2018-09-03 LAB — BASIC METABOLIC PANEL
BUN: 20 mg/dL (ref 6–23)
CO2: 24 mEq/L (ref 19–32)
Calcium: 8.4 mg/dL (ref 8.4–10.5)
Chloride: 109 mEq/L (ref 96–112)
Creatinine, Ser: 1.33 mg/dL (ref 0.40–1.50)
GFR: 56.73 mL/min — AB (ref >60.00)
Glucose, Bld: 112 mg/dL — ABNORMAL HIGH (ref 70–99)
POTASSIUM: 3.7 meq/L (ref 3.5–5.1)
Sodium: 139 mEq/L (ref 135–145)

## 2018-09-03 MED ORDER — ONDANSETRON 4 MG PO TBDP: 4.0000 mg | ORAL_TABLET | Freq: Three times a day (TID) | ORAL | 1 refills | Status: DC | PRN

## 2018-09-03 NOTE — Progress Notes (Signed)
Sergio Griffin    [id number]    20-Jun-1951  Primary Care Physician:Caplan, Legrand Como, MD  Referring Physician: [facility], [address], [place] [address]  Chief complaint: Nausea, diarrhea HPI: 67 year old male with history of morbid obesity, CAD status post PCI, obstructive sleep apnea, esophageal spasms here for follow-up visit. He is having nausea with dry heaving since Saturday, for past 4 to 5 days associated with watery diarrhea.  He is having 8-12 liquid to semi-formed bowel movements with nocturnal symptoms.  He had similar episodes that lasted for a few days about 5 times in the past year from spring to fall.  Denies any association with diet or activity.  No recent dietary changes or medication changes.  He did lose weight intentionally after he quit eating sweets and highly refined carbohydrates He feels lack of energy and generalized body aches.  No blood in stool but noticed some bright red blood when he wiped after multiple bowel movements yesterday.  Swallowing is better, he is no longer choking on food or liquids.  He is off diltiazem.  Colonoscopy 2012 normal.  He was recommended to undergo repeat colonoscopy in 5 years by Dr. Docia Furl due to family history of colon polyps.  Outpatient Encounter Medications as of 09/03/2018  Medication Sig  . amLODipine (NORVASC) 5 MG tablet Take 5 mg by mouth daily.  Marland Kitchen aspirin EC 81 MG tablet Take 81 mg by mouth daily.   Marland Kitchen buPROPion (WELLBUTRIN XL) 300 MG 24 hr tablet Take 1 tablet (300 mg total) by mouth every morning.  . clonazePAM (KLONOPIN) 0.5 MG tablet Take 1 tablet (0.5 mg total) by mouth 2 (two) times daily as needed for anxiety.  . furosemide (LASIX) 20 MG tablet Take 0.5 tablets (10 mg total) by mouth daily. (Patient taking differently: Take 10 mg by mouth daily. As needed)  . latanoprost (XALATAN) 0.005 % ophthalmic solution Place 1 drop into both eyes at bedtime.  Marland Kitchen losartan  (COZAAR) 50 MG tablet Take 50 mg by mouth 2 (two) times daily.   . metoprolol tartrate (LOPRESSOR) 25 MG tablet Take 25 mg by mouth 2 (two) times daily.  . nitroGLYCERIN (NITROSTAT) 0.4 MG SL tablet Place 1 tablet (0.4 mg total) under the tongue every 5 (five) minutes x 3 doses as needed for chest pain.  . Omega-3 Fatty Acids (FISH OIL) 1000 MG CAPS Take 1,000 mg by mouth daily.  . pantoprazole (PROTONIX) 40 MG tablet Take 40 mg by mouth daily.  . ticagrelor (BRILINTA) 90 MG TABS tablet Take 90 mg by mouth 2 (two) times daily.  . [DISCONTINUED] atorvastatin (LIPITOR) 40 MG tablet Take 40 mg by mouth daily.  . [DISCONTINUED] diltiazem (CARDIZEM) 30 MG tablet Take 1 tablet (30 mg total) by mouth 2 (two) times daily. (Patient not taking: Reported on 09/03/2018)  . [DISCONTINUED] ezetimibe (ZETIA) 10 MG tablet Take 10 mg by mouth daily.   No facility-administered encounter medications on file as of 09/03/2018.     Allergies as of 09/03/2018 - Review Complete 09/03/2018  Allergen Reaction Noted  . Other Itching 06/05/2017  . Sulfa antibiotics Itching 06/10/2015    Past Medical History:  Diagnosis Date  . Anxiety   . Barrett's esophagus   . CAD (coronary artery disease)    PCI, stent 2011  . Cardiac arrhythmia   . Chicken pox   . Depression   . Diverticulosis   . Gastric  ulcer   . GERD (gastroesophageal reflux disease)   . Glaucoma   . Hyperlipidemia   . Hypertension   . Kidney stones   . Pneumonia 11/2014  . Prostatitis   . Reflux   . UTI (lower urinary tract infection)     Past Surgical History:  Procedure Laterality Date  . cardiac stents    . ESOPHAGEAL MANOMETRY N/A 09/11/2017   Procedure: ESOPHAGEAL MANOMETRY (EM);  Surgeon: Mauri Pole, MD;  Location: [facility];  Service: Endoscopy;  Laterality: N/A;  . EYE SURGERY Left    x 4  . EYE SURGERY Right    x 3    Family History  Problem Relation Age of Onset  . Arthritis Mother   . Hyperlipidemia Mother     . Stroke Mother   . Hypertension Mother   . Ovarian cancer Mother   . Hyperlipidemia Father   . Heart disease Father   . Hypertension Father   . Non-Hodgkin's lymphoma Father   . Hyperlipidemia Maternal Grandmother   . Heart disease Maternal Grandmother   . Stroke Maternal Grandmother   . Hypertension Maternal Grandmother   . Diabetes Maternal Grandmother   . Hyperlipidemia Maternal Grandfather   . Heart disease Maternal Grandfather   . Hypertension Maternal Grandfather   . Heart disease Paternal Grandmother   . Heart disease Paternal Grandfather   . Stroke Paternal Grandfather   . Hypertension Paternal Grandfather   . Colon cancer Neg Hx   . Esophageal cancer Neg Hx   . Pancreatic cancer Neg Hx   . Stomach cancer Neg Hx   . Liver disease Neg Hx     Social History   Socioeconomic History  . Marital status: Divorced    Spouse name: Not on file  . Number of children: 2  . Years of education: 50  . Highest education level: Not on file  Occupational History  . 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 Financial resource strain: Not on file  . Food insecurity:    Worry: Not on file    Inability: Not on file  . Transportation needs:    Medical: Not on file    Non-medical: Not on file  Tobacco Use  . Smoking status: Former Smoker    Types: Cigarettes  . Smokeless tobacco: Never Used  Substance and Sexual Activity  . Alcohol use: No  . Drug use: No  . Sexual activity: Not Currently    Partners: Female  Lifestyle  . Physical activity:    Days per week: Not on file    Minutes per session: Not on file  . Stress: Not on file  Relationships  . Social connections:    Talks on phone: Not on file    Gets together: Not on file    Attends religious service: Not on file    Active member of club or organization: Not on file    Attends meetings of clubs or organizations: Not on file    Relationship status: Not on file  . Intimate partner  violence:    Fear of current or ex partner: Not on file    Emotionally abused: Not on file    Physically abused: Not on file    Forced sexual activity: Not on file  Other Topics Concern  . Not on file  Social History Narrative   Fun: Paint, cook, travel   Denies religious beliefs effecting health care.  Review of systems: Review of Systems  Constitutional: Negative for fever and chills.  HENT: Positive for sinus problem Eyes: Negative for blurred vision.  Respiratory: Negative for cough, shortness of breath and wheezing.   Cardiovascular: Negative for chest pain and palpitations.  Gastrointestinal: as per HPI Genitourinary: Negative for dysuria, urgency, frequency and hematuria.  Musculoskeletal: Negative for myalgias, back pain and joint pain.  Skin: Negative for itching and rash.  Neurological: Negative for dizziness, tremors, focal weakness, seizures and loss of consciousness.  Endo/Heme/Allergies: Positive for seasonal allergies.  Psychiatric/Behavioral: Negative for suicidal ideas and hallucinations.  Positive for depression and anxiety All other systems reviewed and are negative.   Physical Exam: Vitals:   09/03/18 0840  BP: 110/66  Pulse: 72   Body mass index is 38.6 kg/m. Gen:      No acute distress HEENT:  EOMI, sclera anicteric Neck:     No masses; no thyromegaly Lungs:    Clear to auscultation bilaterally; normal respiratory effort CV:         Regular rate and rhythm; no murmurs Abd:      + bowel sounds; soft, no palpable masses, large pannus, mild distension. Generalized abdominal tenderness left greater than right Ext:    No edema; adequate peripheral perfusion Skin:      Warm and dry; no rash Neuro: alert and oriented x 3 Psych: normal mood and affect  Data Reviewed:  Reviewed labs, radiology imaging, old records and pertinent past GI work up   Assessment and Plan/Recommendations:  67 year old male with history of obesity, obstructive sleep  apnea, CAD on Brilinta status post PCI October 2018 here with complaints of episodic nausea and diarrhea Will check stool GI pathogen panel to exclude infectious etiology Given significant tenderness on abdominal exam, scheduled for CT abdomen pelvis with contrast to evaluate for colitis Zofran 4 mg every 8 hours as needed for nausea He is currently hemodynamically stable.  Advised him to increase oral intake of fluids to avoid dehydration.  Pedialyte 1 to 2 cups every 1-2 hours. If above work-up unrevealing and has persistent symptoms will consider empiric therapy for small intestinal bacterial overgrowth with rifaximin 550 mg 3 times daily for 10 days May also consider colonoscopy with biopsies if continues to have persistent diarrhea Return in 4 to 6 weeks or sooner if needed   Greater than 50% of the time used for counseling as well as treatment plan and follow-up. He had multiple questions which were answered to his satisfaction  K. Denzil Magnuson , MD [phone]    CC: [facility], FNP

## 2018-09-03 NOTE — Patient Instructions (Signed)
Go to the basement for labs today    You have been scheduled for a CT scan of the abdomen and pelvis at [facility] ([address]---this is in the same building as [facility]).   You are scheduled on 09/08/2018 at 3:45pm. You should arrive 15 minutes prior to your appointment time for registration. Please follow the written instructions below on the day of your exam:  WARNING: IF YOU ARE ALLERGIC TO IODINE/X-RAY DYE, PLEASE NOTIFY RADIOLOGY IMMEDIATELY AT [phone]! YOU WILL BE GIVEN A 13 HOUR PREMEDICATION PREP.  1) Do not eat or drink anything after 11:45am (4 hours prior to your test) 2) You have been given 2 bottles of oral contrast to drink. The solution may taste better if refrigerated, but do NOT add ice or any other liquid to this solution. Shake well before drinking.    Drink 1 bottle of contrast @ 1:45pm (2 hours prior to your exam)  Drink 1 bottle of contrast @ 2:45pm (1 hour prior to your exam)  You may take any medications as prescribed with a small amount of water, if necessary. If you take any of the following medications: METFORMIN, GLUCOPHAGE, GLUCOVANCE, AVANDAMET, RIOMET, FORTAMET, [place] MET, JANUMET, GLUMETZA or METAGLIP, you MAY be asked to HOLD this medication 48 hours AFTER the exam.  The purpose of you drinking the oral contrast is to aid in the visualization of your intestinal tract. The contrast solution may cause some diarrhea. Depending on your individual set of symptoms, you may also receive an intravenous injection of x-ray contrast/dye. Plan on being at [facility] for 30 minutes or longer, depending on the type of exam you are having performed.  This test typically takes 30-45 minutes to complete.  If you have any questions regarding your exam or if you need to reschedule, you may call the CT department at [phone] between the hours of 8:00 am and 5:00 pm,  Monday-Friday.  ________________________________________________________________________

## 2018-09-08 ENCOUNTER — Ambulatory Visit (INDEPENDENT_AMBULATORY_CARE_PROVIDER_SITE_OTHER): Payer: Medicare Other | Admitting: Psychiatry

## 2018-09-08 ENCOUNTER — Ambulatory Visit (INDEPENDENT_AMBULATORY_CARE_PROVIDER_SITE_OTHER)
Admission: RE | Admit: 2018-09-08 | Discharge: 2018-09-08 | Disposition: A | Discharge status: Home / Self Care | Payer: Medicare Other | Source: Ambulatory Visit | Attending: Gastroenterology | Admitting: Gastroenterology

## 2018-09-08 DIAGNOSIS — R1084 Generalized abdominal pain: Secondary | ICD-10-CM

## 2018-09-08 DIAGNOSIS — F332 Major depressive disorder, recurrent severe without psychotic features: Secondary | ICD-10-CM | POA: Diagnosis not present

## 2018-09-08 DIAGNOSIS — N200 Calculus of kidney: Secondary | ICD-10-CM | POA: Diagnosis not present

## 2018-09-08 DIAGNOSIS — K5730 Diverticulosis of large intestine without perforation or abscess without bleeding: Secondary | ICD-10-CM | POA: Diagnosis not present

## 2018-09-08 DIAGNOSIS — R110 Nausea: Secondary | ICD-10-CM

## 2018-09-08 MED ORDER — IOPAMIDOL (ISOVUE-300) INJECTION 61%
100.0000 mL | Freq: Once | INTRAVENOUS | Status: AC | PRN
  Administered 2018-09-08: 100 mL via INTRAVENOUS

## 2018-09-08 NOTE — Progress Notes (Signed)
      [facility] Counselor/Therapist Progress Note   Patient ID: Sergio Griffin, MRN: [id number]  Date: 09/08/2018  Timespent: 45 minutes  Treatment Type: Individual  Subjective: Patient in today reporting depression, fatigue, stressed, anxiety.  Also reports some stomach issues for which he is seeing his medical doctor this afternoon.  Mom has been ill in hospital and had to reluctantly go to a nursing facility for rehab, however she did not want to go to the facility.  Mom at odds with patient as he tries to help make appropriate health decisions for her, and mom wants only to just go back to her own home.  Hard for patient to look after mom and take care of himself. Processed the issues patient has with his mom and her failing health, and the health of his brother as they want him to take care of them, but patient is feeling stressed knowing that he really can"t take on the responsibility of caring for both of them. Discussed difficulty in being helpful to mom and not being manipulated nor feel guilty.  Worked on additional strategies to set appropriate limits with mom and others without feeling he is being diifficult to deal with.  Self-care emphasized (sleep, exercise, eat healthy, friend contact).   Interventions:Solution Focused, Strength-based and Supportive  Mental Status Exam:   Appearance:   Casual     Behavior:  Appropriate, Sharing and Motivated  Motor:  Normal  Speech/Language:   Normal Rate  Affect:  Depressed and Flat  Mood:  anxious and depressed  Thought process:  Coherent  Thought content:    Logical  Perceptual disturbances:    Normal  Orientation:  Full (Time, Place, and Person)  Attention:  Good  Concentration:  good  Memory:  Immediate  Fund of knowledge:   Good  Insight:    Good  Judgment:   Good  Impulse Control:  good    Reported Symptoms: depression, stressed, anxiety, fatigue.  Risk Assessment: Danger to Self:  No Self-injurious Behavior:  No Danger to Others: No Duty to Warn:no Physical Aggression / Violence:No  Access to Firearms a concern: No  Gang Involvement:No   Diagnosis:   ICD-10-CM   1. Major depressive disorder, recurrent, severe without psychotic features ([place]) F33.2      Plan: Plan to see patient in 1-2 weeks to continue goal-directed treatment.  Shanon Ace, LCSW

## 2018-09-09 ENCOUNTER — Other Ambulatory Visit: Payer: Self-pay

## 2018-09-09 DIAGNOSIS — K654 Sclerosing mesenteritis: Secondary | ICD-10-CM

## 2018-09-09 MED ORDER — TRAMADOL HCL 50 MG PO TABS: 50.0000 mg | ORAL_TABLET | Freq: Two times a day (BID) | ORAL | 0 refills | Status: DC | PRN

## 2018-09-13 DIAGNOSIS — Z23 Encounter for immunization: Secondary | ICD-10-CM | POA: Diagnosis not present

## 2018-09-15 ENCOUNTER — Ambulatory Visit (INDEPENDENT_AMBULATORY_CARE_PROVIDER_SITE_OTHER): Payer: Medicare Other | Admitting: Psychiatry

## 2018-09-15 ENCOUNTER — Other Ambulatory Visit: Payer: Self-pay

## 2018-09-15 ENCOUNTER — Other Ambulatory Visit (INDEPENDENT_AMBULATORY_CARE_PROVIDER_SITE_OTHER): Payer: Medicare Other

## 2018-09-15 DIAGNOSIS — K654 Sclerosing mesenteritis: Secondary | ICD-10-CM

## 2018-09-15 DIAGNOSIS — K529 Noninfective gastroenteritis and colitis, unspecified: Secondary | ICD-10-CM

## 2018-09-15 DIAGNOSIS — F332 Major depressive disorder, recurrent severe without psychotic features: Secondary | ICD-10-CM

## 2018-09-15 LAB — CBC WITH DIFFERENTIAL/PLATELET
Basophils Absolute: 0.1 10*3/uL (ref 0.0–0.1)
Basophils Relative: 0.8 % (ref 0.0–3.0)
EOS PCT: 6.4 % — AB (ref 0.0–5.0)
Eosinophils Absolute: 0.8 10*3/uL — ABNORMAL HIGH (ref 0.0–0.7)
HCT: 44.2 % (ref 39.0–52.0)
Hemoglobin: 14.6 g/dL (ref 13.0–17.0)
Lymphocytes Relative: 22.4 % (ref 12.0–46.0)
Lymphs Abs: 2.7 10*3/uL (ref 0.7–4.0)
MCHC: 33 g/dL (ref 30.0–36.0)
MCV: 87.5 fl (ref 78.0–100.0)
MONOS PCT: 5.7 % (ref 3.0–12.0)
Monocytes Absolute: 0.7 10*3/uL (ref 0.1–1.0)
NEUTROS PCT: 64.7 % (ref 43.0–77.0)
Neutro Abs: 7.8 10*3/uL — ABNORMAL HIGH (ref 1.4–7.7)
Platelets: 290 10*3/uL (ref 150.0–400.0)
RBC: 5.04 Mil/uL (ref 4.22–5.81)
RDW: 14.3 % (ref 11.5–15.5)
WBC: 12 10*3/uL — AB (ref 4.0–10.5)

## 2018-09-15 LAB — SEDIMENTATION RATE: Sed Rate: 43 mm/hr — ABNORMAL HIGH (ref 0–20)

## 2018-09-15 NOTE — Progress Notes (Signed)
      [facility] Counselor/Therapist Progress Note   Patient ID: Sergio Griffin, MRN: [id number]  Date: 09/15/2018  Timespent:  50 minutes       Treatment Type: Individual   Reported Symptoms: Fatigue and depression, anxiety, frustration   Mental Status Exam:    Appearance:   Casual     Behavior:  Appropriate and Sharing  Motor:  Normal  Speech/Language:   Normal Rate  Affect:  Congruent  Mood:  depressed, irritable and anxious  Thought process:  goal directed  Thought content:    WNL  Sensory/Perceptual disturbances:    WNL  Orientation:  oriented to person, place, time/date, situation, day of week, month of year and year  Attention:  Good  Concentration:  Good  Memory:  WNL  Fund of knowledge:   Good  Insight:    Good  Judgment:   Good  Impulse Control:  Good     Risk Assessment: Danger to Self:  No Self-injurious Behavior: No Danger to Others: No Duty to Warn:no Physical Aggression / Violence:No  Access to Firearms a concern: No  Gang Involvement:No    Subjective:  Patient in today very stressed, anxious, some depressed mood, and some irritability mostly around the illness of his mother.  She can be very demanding of patient and he senses a real need to set appropriate boundaries, and yet still be involved in her care and support. Struggling also with issue of aging.  "I don't feel a lot older but seem to notice that often people treat me like I'm older."  Sometimes he laughs it off and sometimes it annoys him. Worked on ways to respond in conversations that would be better for patient, setting realistic boundaries, and stress management. Feeling weighted down by some of his issues, so is motivated for change.     Interventions: Cognitive Behavioral Therapy, Solution-Oriented/Positive Psychology and Insight-Oriented   Diagnosis:   ICD-10-CM   1. Major depressive disorder, recurrent, severe without psychotic features ([place]) F33.2      Plan: Will see  patient again within 2 weeks to continue goal-directed treatment.   Shanon Ace, LCSW

## 2018-09-22 ENCOUNTER — Ambulatory Visit (INDEPENDENT_AMBULATORY_CARE_PROVIDER_SITE_OTHER): Payer: Medicare Other | Admitting: Psychiatry

## 2018-09-22 DIAGNOSIS — F332 Major depressive disorder, recurrent severe without psychotic features: Secondary | ICD-10-CM

## 2018-09-22 NOTE — Progress Notes (Signed)
      [facility] Counselor/Therapist Progress Note   Patient ID: Sergio Griffin, MRN: [id number]  Date: 09/22/2018  Timespent:    Treatment Type: Individual   Reported Symptoms: Fatigue and anxiety, headache, anger, frustration   Mental Status Exam:    Appearance:   Casual     Behavior:  Appropriate and Sharing  Motor:  Normal  Speech/Language:   Normal Rate  Affect:  Congruent  Mood:  anxious and depressed  Thought process:  normal  Thought content:    WNL  Sensory/Perceptual disturbances:    WNL  Orientation:  oriented to person, place, time/date, situation, day of week, month of year and year  Attention:  Good  Concentration:  Good  Memory:  WNL  Fund of knowledge:   Good  Insight:    Good  Judgment:   Good  Impulse Control:  Good     Risk Assessment: Danger to Self:  No Self-injurious Behavior: No Danger to Others: No Duty to Warn:no Physical Aggression / Violence:No  Access to Firearms a concern: No  Gang Involvement:No    Subjective:  Patient in today feeling anxiety, some depression, frustrated, and angry.  Issues continue with elderly mother's excessive expectations, dealing with medical professionals on behalf of his mother and they have been very unreliable, and trying to get some help from his self-centered brother in working together for their mother. Attended his high school reunion this past weekend Patient became very sad during session today--"I've spent so much of my live living in the shadows feeling I'm not as good as others, being afraid to do some things for fear of being made fun of by peers".  Hard to try to things or behave in different ways for fear of people making fun of him.  Feels very inferior to those who he feels are wealthier than him---"have always felt this even when I was growing up."  Discussed how to make changes both in his behavior and is his thoughts that would be more freeing and not as self-limiting.  "Have let fear and  grief dominate my life for years and years and I can't change that."  Agreed with patient he couldn't change that but that he can do a lot to change his future.  Showing good motivation as it's obvious patient  is not real comfortable talking this opening about his emotions and feelings.  Has chronically felt "very low opinion of himself. Is doing well in following up on suggestions in previous sessions and states that he feels like he is starting to really live his life now.  This has been a gradual process.  Some positive interaction started with patient and his adult son's girlfriend.  Goal review and progress noted.    Interventions: Cognitive Behavioral Therapy, Solution-Oriented/Positive Psychology and Ego-Supportive   Diagnosis:   ICD-10-CM   1. Major depressive disorder, recurrent, severe without psychotic features ([place]) F33.2      Plan: Plan to see patient in 1 week for continued goal-directed treatment.   Shanon Ace, LCSW

## 2018-09-23 DIAGNOSIS — H401133 Primary open-angle glaucoma, bilateral, severe stage: Secondary | ICD-10-CM | POA: Diagnosis not present

## 2018-09-26 DIAGNOSIS — R911 Solitary pulmonary nodule: Secondary | ICD-10-CM | POA: Diagnosis not present

## 2018-09-29 ENCOUNTER — Ambulatory Visit (INDEPENDENT_AMBULATORY_CARE_PROVIDER_SITE_OTHER): Payer: Medicare Other | Admitting: Psychiatry

## 2018-09-29 DIAGNOSIS — F332 Major depressive disorder, recurrent severe without psychotic features: Secondary | ICD-10-CM

## 2018-09-29 NOTE — Progress Notes (Signed)
      [facility] Counselor/Therapist Progress Note   Patient ID: Sergio Griffin, MRN: [id number]  Date: 09/29/2018  Timespent:  58 minutes   Treatment Type: Individual   Reported Symptoms: fatigue, depression, anxiety, stress, frustration, anger, disappointment   Mental Status Exam:    Appearance:   Casual     Behavior:  Appropriate and Sharing  Motor:  Normal  Speech/Language:   Normal Rate  Affect:  Blunt  Mood:  anxious and depressed  Thought process:  normal  Thought content:    WNL  Sensory/Perceptual disturbances:    WNL  Orientation:  oriented to person, place, time/date, situation, day of week, month of year and year  Attention:  Good  Concentration:  Good  Memory:  WNL  Fund of knowledge:   Good  Insight:    Good  Judgment:   Good  Impulse Control:  Good     Risk Assessment: Danger to Self:  No Self-injurious Behavior: No Danger to Others: No Duty to Warn:no Physical Aggression / Violence:No  Access to Firearms a concern: No  Gang Involvement:No    Subjective: Patient in today reporting the symptoms named above.  Having a lot of difficulty with the caregiving of his elderly mother.  Patient's brother is a complicating factor in all this with his anger and disruption in household as he lives with mother.  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 Also trying to have better relationship with son and his GF.  GF is perceived to feel that patient is a threat to GF and son's relationship. Patient working to let go more of negative incidents in past.     Interventions: Cognitive Behavioral Therapy, Solution-Oriented/Positive Psychology and Ego-Supportive   Diagnosis:   ICD-10-CM   1. Major depressive disorder, recurrent, severe without psychotic features ([place]) F33.2      Plan: Plan to see patient within 1-2 weeks to continue goal-directed treatment.   Shanon Ace, LCSW

## 2018-09-30 DIAGNOSIS — Z85828 Personal history of other malignant neoplasm of skin: Secondary | ICD-10-CM | POA: Diagnosis not present

## 2018-09-30 DIAGNOSIS — Z08 Encounter for follow-up examination after completed treatment for malignant neoplasm: Secondary | ICD-10-CM | POA: Diagnosis not present

## 2018-09-30 DIAGNOSIS — L720 Epidermal cyst: Secondary | ICD-10-CM | POA: Diagnosis not present

## 2018-09-30 DIAGNOSIS — L218 Other seborrheic dermatitis: Secondary | ICD-10-CM | POA: Diagnosis not present

## 2018-09-30 DIAGNOSIS — L2089 Other atopic dermatitis: Secondary | ICD-10-CM | POA: Diagnosis not present

## 2018-10-06 ENCOUNTER — Ambulatory Visit: Payer: Self-pay | Admitting: Psychiatry

## 2018-10-14 ENCOUNTER — Ambulatory Visit (INDEPENDENT_AMBULATORY_CARE_PROVIDER_SITE_OTHER): Payer: Medicare Other | Admitting: Psychiatry

## 2018-10-14 DIAGNOSIS — F332 Major depressive disorder, recurrent severe without psychotic features: Secondary | ICD-10-CM

## 2018-10-14 NOTE — Progress Notes (Signed)
      [facility] Counselor/Therapist Progress Note  Patient ID: Sergio Griffin, MRN: [id number],    Date: 10/14/2018  Time Spent:  58 minutes   Treatment Type: Individual Therapy  Reported Symptoms: Depressed mood, Anxious Mood, Panic Attacks, Ruminations, Irritability, Fatigue, Concentration impairments and Distractible  Mental Status Exam:  Appearance:   Casual     Behavior:  Appropriate and Sharing  Motor:  Normal  Speech/Language:   Normal Rate  Affect:  Congruent  Mood:  anxious, depressed and irritable  Thought process:  normal  Thought content:    WNL  Sensory/Perceptual disturbances:    WNL  Orientation:  oriented to person, place, time/date, situation, day of week, month of year and year  Attention:  Good  Concentration:  Good  Memory:  Patient reports "forgetfulness" with recent memory [employer] of knowledge:   Good  Insight:    Good  Judgment:   Good  Impulse Control:  Good   Risk Assessment: Danger to Self:  No Self-injurious Behavior: No Danger to Others: No Duty to Warn:no Physical Aggression / Violence:No  Access to Firearms a concern: No  Gang Involvement:No   Subjective:  Patient in today with the above-mentioned symptoms. Issues with his brother and how he treats their mother. Talked about options he has in case that situation if it does not improve.  Also discussed son and his girlfriend and their relationship especially as it relates to patient.  Patient no particularly comfortable with son's GF, although it's becoming a "work in progress".  Helped him to see some of the positive signs his son is showing in regards to staying in relationship with patient.  Dreaming some about his ex-wife some, which he processed   Interventions: Cognitive Behavioral Therapy and Ego-Supportive  Diagnosis:   ICD-10-CM   1. Major depressive disorder, recurrent, severe without psychotic features ([place]) F33.2     Plan:  Patient encouraged in his self-care  (mentally, physically, emotionally).  To be open to viewing some situations discussed today in different ways rather than self-limiting ways.  To work further on relationship with son and his GF rather than avoiding.  Discussed ways of doing this during session today.  Shanon Ace, LCSW

## 2018-10-21 DIAGNOSIS — R9389 Abnormal findings on diagnostic imaging of other specified body structures: Secondary | ICD-10-CM | POA: Diagnosis not present

## 2018-10-21 DIAGNOSIS — B349 Viral infection, unspecified: Secondary | ICD-10-CM | POA: Diagnosis not present

## 2018-10-21 DIAGNOSIS — R911 Solitary pulmonary nodule: Secondary | ICD-10-CM | POA: Diagnosis not present

## 2018-10-21 DIAGNOSIS — R0602 Shortness of breath: Secondary | ICD-10-CM | POA: Diagnosis not present

## 2018-10-22 ENCOUNTER — Ambulatory Visit (INDEPENDENT_AMBULATORY_CARE_PROVIDER_SITE_OTHER): Payer: Medicare Other | Admitting: Psychiatry

## 2018-10-22 ENCOUNTER — Ambulatory Visit (INDEPENDENT_AMBULATORY_CARE_PROVIDER_SITE_OTHER): Payer: Medicare Other | Admitting: Gastroenterology

## 2018-10-22 ENCOUNTER — Encounter: Payer: Self-pay | Admitting: Gastroenterology

## 2018-10-22 VITALS — BP 126/62 | HR 69 | Ht 71.0 in

## 2018-10-22 DIAGNOSIS — K5909 Other constipation: Secondary | ICD-10-CM

## 2018-10-22 DIAGNOSIS — F332 Major depressive disorder, recurrent severe without psychotic features: Secondary | ICD-10-CM

## 2018-10-22 DIAGNOSIS — M793 Panniculitis, unspecified: Secondary | ICD-10-CM

## 2018-10-22 DIAGNOSIS — K588 Other irritable bowel syndrome: Secondary | ICD-10-CM | POA: Diagnosis not present

## 2018-10-22 DIAGNOSIS — K219 Gastro-esophageal reflux disease without esophagitis: Secondary | ICD-10-CM | POA: Diagnosis not present

## 2018-10-22 MED ORDER — FAMOTIDINE 40 MG PO TABS: 40.0000 mg | ORAL_TABLET | Freq: Every day | ORAL | 3 refills | Status: DC

## 2018-10-22 MED ORDER — LINACLOTIDE 72 MCG PO CAPS: 72.0000 ug | ORAL_CAPSULE | Freq: Every day | ORAL | 3 refills | Status: DC

## 2018-10-22 NOTE — Progress Notes (Signed)
Sergio Griffin    [id number]    August 20, 1951  Primary Care Physician:Caplan, Legrand Como, MD  Referring Physician: Moshe Cipro, MD [address] [place] [address]  Chief complaint: Constipation, abdominal discomfort HPI: 67 year old male with morbid obesity, CAD s/p PCI, obstructive sleep apnea, GERD and esophageal dysmotility with esophageal spasms here for follow-up visit He is no longer having esophageal spasms are severe GERD related symptoms.  Is taking pantoprazole as needed, has noticed episodes of diarrhea whenever he takes pantoprazole and is trying to avoid it. He had only one episode of diarrhea in the past month.  Currently he is constipated with bowel movement once every 2 to 3 days.  He is trying to increase dietary fiber intake and also trying to increase water intake.  He is drinking prune juice daily to improve bowel habits.  On most days he is passing hard pellets and also has to strain excessively. Denies any rectal bleeding.  Colonoscopy 2012 by Dr. Algis Greenhouse normal with no polyps.  He was recommended to repeat colonoscopy in 5 years due to family history of colon polyps  CT abdomen pelvis September 08, 2018 showed severe diverticulosis involving left side of the colon with no acute diverticulitis, small nonobstructing left renal calculi and mesenteric panniculitis  Outpatient Encounter Medications as of 10/22/2018  Medication Sig  . amLODipine (NORVASC) 5 MG tablet Take 5 mg by mouth daily.  Marland Kitchen aspirin EC 81 MG tablet Take 81 mg by mouth daily.   Marland Kitchen buPROPion (WELLBUTRIN XL) 300 MG 24 hr tablet Take 1 tablet (300 mg total) by mouth every morning.  . clonazePAM (KLONOPIN) 0.5 MG tablet Take 1 tablet (0.5 mg total) by mouth 2 (two) times daily as needed for anxiety.  Marland Kitchen latanoprost (XALATAN) 0.005 % ophthalmic solution Place 1 drop into both eyes at bedtime.  Marland Kitchen losartan (COZAAR) 50 MG tablet Take 50 mg by mouth 2 (two) times daily.   . metoprolol  tartrate (LOPRESSOR) 25 MG tablet Take 25 mg by mouth 2 (two) times daily.  . nitroGLYCERIN (NITROSTAT) 0.4 MG SL tablet Place 1 tablet (0.4 mg total) under the tongue every 5 (five) minutes x 3 doses as needed for chest pain.  . Omega-3 Fatty Acids (FISH OIL) 1000 MG CAPS Take 1,000 mg by mouth daily.  . ondansetron (ZOFRAN ODT) 4 MG disintegrating tablet Take 1 tablet (4 mg total) by mouth every 8 (eight) hours as needed for nausea or vomiting.  . ticagrelor (BRILINTA) 90 MG TABS tablet Take 90 mg by mouth 2 (two) times daily.  . traMADol (ULTRAM) 50 MG tablet Take 1 tablet (50 mg total) by mouth every 12 (twelve) hours as needed for moderate pain or severe pain (M79.3).  . [DISCONTINUED] furosemide (LASIX) 20 MG tablet Take 0.5 tablets (10 mg total) by mouth daily. (Patient taking differently: Take 10 mg by mouth daily. As needed)  . [DISCONTINUED] pantoprazole (PROTONIX) 40 MG tablet Take 40 mg by mouth daily.   No facility-administered encounter medications on file as of 10/22/2018.     Allergies as of 10/22/2018 - Review Complete 10/22/2018  Allergen Reaction Noted  . Other Itching 06/05/2017  . Sulfa antibiotics Itching 06/10/2015    Past Medical History:  Diagnosis Date  . Anxiety   . Barrett's esophagus   . CAD (coronary artery disease)    PCI, stent 2011  . Cardiac arrhythmia   . Chicken pox   . Depression   .  Diverticulosis   . Gastric ulcer   . GERD (gastroesophageal reflux disease)   . Glaucoma   . Hyperlipidemia   . Hypertension   . Kidney stones   . Pneumonia 11/2014  . Prostatitis   . Reflux   . UTI (lower urinary tract infection)     Past Surgical History:  Procedure Laterality Date  . cardiac stents    . ESOPHAGEAL MANOMETRY N/A 09/11/2017   Procedure: ESOPHAGEAL MANOMETRY (EM);  Surgeon: Mauri Pole, MD;  Location: [facility];  Service: Endoscopy;  Laterality: N/A;  . EYE SURGERY Left    x 4  . EYE SURGERY Right    x 3    Family History    Problem Relation Age of Onset  . Arthritis Mother   . Hyperlipidemia Mother   . Stroke Mother   . Hypertension Mother   . Ovarian cancer Mother   . Hyperlipidemia Father   . Heart disease Father   . Hypertension Father   . Non-Hodgkin's lymphoma Father   . Hyperlipidemia Maternal Grandmother   . Heart disease Maternal Grandmother   . Stroke Maternal Grandmother   . Hypertension Maternal Grandmother   . Diabetes Maternal Grandmother   . Hyperlipidemia Maternal Grandfather   . Heart disease Maternal Grandfather   . Hypertension Maternal Grandfather   . Heart disease Paternal Grandmother   . Heart disease Paternal Grandfather   . Stroke Paternal Grandfather   . Hypertension Paternal Grandfather   . Colon cancer Neg Hx   . Esophageal cancer Neg Hx   . Pancreatic cancer Neg Hx   . Stomach cancer Neg Hx   . Liver disease Neg Hx     Social History   Socioeconomic History  . Marital status: Divorced    Spouse name: Not on file  . Number of children: 2  . Years of education: 21  . Highest education level: Not on file  Occupational History  . 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 Financial resource strain: Not on file  . Food insecurity:    Worry: Not on file    Inability: Not on file  . Transportation needs:    Medical: Not on file    Non-medical: Not on file  Tobacco Use  . Smoking status: Former Smoker    Types: Cigarettes  . Smokeless tobacco: Never Used  . Tobacco comment: quit 1987  Substance and Sexual Activity  . Alcohol use: No  . Drug use: No  . Sexual activity: Not Currently    Partners: Female  Lifestyle  . Physical activity:    Days per week: Not on file    Minutes per session: Not on file  . Stress: Not on file  Relationships  . Social connections:    Talks on phone: Not on file    Gets together: Not on file    Attends religious service: Not on file    Active member of club or organization: Not on file     Attends meetings of clubs or organizations: Not on file    Relationship status: Not on file  . Intimate partner violence:    Fear of current or ex partner: Not on file    Emotionally abused: Not on file    Physically abused: Not on file    Forced sexual activity: Not on file  Other Topics Concern  . Not on file  Social History Narrative   Fun: Paint, cook, travel   Denies  religious beliefs effecting health care.       Review of systems: Review of Systems  Constitutional: Negative for fever and chills.  Positive for lack of energy HENT: Positive for sinus problem Eyes: Negative for blurred vision.  Respiratory: Negative for cough, shortness of breath and wheezing.   Cardiovascular: Negative for chest pain and palpitations.  Gastrointestinal: as per HPI Genitourinary: Negative for dysuria, urgency, frequency and hematuria.  Musculoskeletal: Negative for myalgias, back pain and joint pain.  Skin: Negative for itching and rash.  Neurological: Negative for dizziness, tremors, focal weakness, seizures and loss of consciousness.  Endo/Heme/Allergies: Positive for seasonal allergies.  Psychiatric/Behavioral: Negative for  suicidal ideas and hallucinations.  Positive for depression and anxiety All other systems reviewed and are negative.   Physical Exam: Vitals:   10/22/18 1343  BP: 126/62  Pulse: 69   Body mass index is 37.52 kg/m. Gen:      No acute distress HEENT:  EOMI, sclera anicteric Neck:     No masses; no thyromegaly Lungs:    Clear to auscultation bilaterally; normal respiratory effort CV:         Regular rate and rhythm; no murmurs Abd:      + bowel sounds; soft, non-tender; no palpable masses, no distension Ext:    No edema; adequate peripheral perfusion Skin:      Warm and dry; no rash Neuro: alert and oriented x 3 Psych: normal mood and affect  Data Reviewed:  Reviewed labs, radiology imaging, old records and pertinent past GI work up   Assessment and  Plan/Recommendations:  67 year old male with history of obesity, obstructive sleep apnea, CAD status post PCI October 2018 on Brilinta Abdominal pain is improving, mild mesenteric panniculitis noted on CT abdomen pelvis. Constipation despite increase dietary fiber and fluid intake Start Linzess 72 mcg daily, will up titrate the dose based on response Esophageal dysmotility and esophageal spasms: Improved  GERD: Improved DC Protonix as patient complaining of diarrhea whenever he takes it Use Pepcid 20 mg twice daily as needed for breakthrough heartburn  25 minutes was spent face-to-face with the patient. Greater than 50% of the time used for counseling as well as treatment plan and follow-up. He had multiple questions which were answered to his satisfaction  K. Denzil Magnuson , MD [phone]    CC: Moshe Cipro, MD

## 2018-10-22 NOTE — Progress Notes (Signed)
      [facility] Counselor/Therapist Progress Note  Patient ID: Sergio Griffin, MRN: [id number],    Date: 10/22/2018  Time Spent: 57 minutes  Treatment Type: Individual Therapy  Reported Symptoms: Depressed mood, Anxious Mood, Ruminations, Irritability and Fatigue  Mental Status Exam:  Appearance:   Casual     Behavior:  Appropriate and Sharing  Motor:  Normal  Speech/Language:   Normal Rate  Affect:  anxious  Mood:  anxious  Thought process:  normal  Thought content:    WNL  Sensory/Perceptual disturbances:    WNL  Orientation:  oriented to person, place, time/date, situation, day of week, month of year and year  Attention:  Good  Concentration:  Good  Memory:  WNL  Fund of knowledge:   Good  Insight:    Good  Judgment:   Good  Impulse Control:  Good   Risk Assessment: Danger to Self:  No Self-injurious Behavior: No Danger to Others: No Duty to Warn:no Physical Aggression / Violence:No  Access to Firearms a concern: No  Gang Involvement:No   Subjective:  Patient in today with anxiety and some depression, as well as fatigued and irritable.  Difficult situations currently with brother (interpersonally) and mother (significant health issues).  Brother's situation is worsening and patient is beginning to set healthier limits with him.  Has gotten outside more lately which has helped mood some. Reviewed communication strategies that could help him as he talks with mother and brother, focusing on listening first and then speaking, and also being more honest in his communications with them.     Interventions: Cognitive Behavioral Therapy and Solution-Oriented/Positive Psychology  Diagnosis:   ICD-10-CM   1. Major depressive disorder, recurrent, severe without psychotic features ([place]) F33.2     Plan: Patient to follow through on some techniques mentioned above and feels he will get a chance to practice some different behaviors during th upcoming holiday.  To return in  approx 1-2 weeks.    Shanon Ace, LCSW

## 2018-10-22 NOTE — Patient Instructions (Signed)
We have given you samples of Linzess 72 mcg and sent a prescription to your pharmacy   We have sent to Pepcid to your pharmacy  If you are age 68 or older, your body mass index should be between 23-30. Your Body mass index is 37.52 kg/m. If this is out of the aforementioned range listed, please consider follow up with your Primary Care Provider.  If you are age 83 or younger, your body mass index should be between 19-25. Your Body mass index is 37.52 kg/m. If this is out of the aformentioned range listed, please consider follow up with your Primary Care Provider.    Thank you for choosing [facility]  Karleen Hampshire Nandigam,MD

## 2018-10-23 ENCOUNTER — Ambulatory Visit: Payer: Medicare Other | Admitting: Psychiatry

## 2018-10-30 ENCOUNTER — Ambulatory Visit (INDEPENDENT_AMBULATORY_CARE_PROVIDER_SITE_OTHER): Payer: Medicare Other | Admitting: Psychiatry

## 2018-10-30 ENCOUNTER — Encounter: Payer: Self-pay | Admitting: Psychiatry

## 2018-10-30 VITALS — BP 118/61 | HR 59

## 2018-10-30 DIAGNOSIS — F332 Major depressive disorder, recurrent severe without psychotic features: Secondary | ICD-10-CM

## 2018-10-30 DIAGNOSIS — F5101 Primary insomnia: Secondary | ICD-10-CM | POA: Diagnosis not present

## 2018-10-30 DIAGNOSIS — F331 Major depressive disorder, recurrent, moderate: Secondary | ICD-10-CM | POA: Diagnosis not present

## 2018-10-30 DIAGNOSIS — F410 Panic disorder [episodic paroxysmal anxiety] without agoraphobia: Secondary | ICD-10-CM

## 2018-10-30 MED ORDER — CLONAZEPAM 0.5 MG PO TABS: 0.5000 mg | ORAL_TABLET | Freq: Two times a day (BID) | ORAL | 0 refills | Status: DC | PRN

## 2018-10-30 MED ORDER — BUPROPION HCL ER (XL) 300 MG PO TB24: 300.0000 mg | ORAL_TABLET | ORAL | 1 refills | Status: DC

## 2018-10-30 NOTE — Progress Notes (Signed)
Sergio Griffin [id number] Jan 11, 1951 67 y.o.  Subjective:   Patient ID:  Sergio Griffin is a 67 y.o. (DOB 1951-06-26) male.  Chief Complaint:  Chief Complaint  Patient presents with  . Anxiety  . Depression  . Insomnia    HPI Sergio Griffin presents to the office today for follow-up of depression, anxiety, and insomnia. He reports that he is "in some ways better." Reports that mother was hospitalized x 1 week for CHF. Reports rehab was recommended and mother was resistant to this and pt agreed to come see her daily. Reports that he has been spending 6-12 hours a day at mother's SNF. Mother is now at home. Brother is also having health issues. Pt's brother and mother live together and pt is trying to help them. He reports that at times they are not compliant with medical recommendations and at times this causes him severe frustration and irritability. Reports that he has limited psychosocial support and interacts with several people, but often feels that he listens and supports others and this is not reciprocated. "My only peace is when I go home at night." Reports that he has recently been taking Klonopin prn daily and occasionally BID. Reports panic attacks occur a few times a week. Has been trying other strategies to alleviate anxiety to include meditation. Reports some increase in anxiety with psychosocial stress. Reports that Wellbutrin has been helpful for his mood. He reports that therapy has been helpful for his mood, anxiety, and coping skills. Reports depressed mood "vascillates" in response to family stressors. Reports that in some ways depression has improved. Reports occasional irritability. Reports that he had lost weight and has not been able to eat as healthy recently due to being away from home with family member's health issues. Energy has not been as good since not eating as healthy. Now sleeping 6.5-8 hours a night. Motivation is adequate. Concentration is better away from  family.   Daughter is pregnant and is expecting in March. Son will likely move out in the next 3-4 months. Reports that he will miss regular interactions with son.   Started BiBap instead of cPap last night.   Past Psychiatric Medication Trials: Prozac- Adverse effects Wellbutrin- helpful for depression Celexa- Affective dulling Lexapro Imipramine- Helpful for anxiety, worsened glaucoma Klonopin- effective Ambien-ineffective.     Review of Systems:  Review of Systems  Constitutional: Positive for fatigue.  Respiratory: Positive for shortness of breath.   Musculoskeletal: Negative for gait problem.  Neurological: Negative for tremors.       Reports some difficulty with balance immediately upon standing.  Psychiatric/Behavioral:       Please refer to HPI    Medications: I have reviewed the patient's current medications.  Current Outpatient Medications  Medication Sig Dispense Refill  . amLODipine (NORVASC) 10 MG tablet Take 10 mg by mouth daily.     Marland Kitchen aspirin EC 81 MG tablet Take 81 mg by mouth daily.     Marland Kitchen buPROPion (WELLBUTRIN XL) 300 MG 24 hr tablet Take 1 tablet (300 mg total) by mouth every morning. 90 tablet 1  . clonazePAM (KLONOPIN) 0.5 MG tablet Take 1 tablet (0.5 mg total) by mouth 2 (two) times daily as needed for anxiety. 135 tablet 0  . famotidine (PEPCID) 40 MG tablet Take 1 tablet (40 mg total) by mouth daily. 30 tablet 3  . furosemide (LASIX) 20 MG tablet Take 20 mg by mouth daily as needed.    . latanoprost (XALATAN) 0.005 %  ophthalmic solution Place 1 drop into both eyes at bedtime.    Marland Kitchen linaclotide (LINZESS) 72 MCG capsule Take 1 capsule (72 mcg total) by mouth daily before breakfast. 30 capsule 3  . losartan (COZAAR) 50 MG tablet Take 50 mg by mouth 2 (two) times daily.     . metoprolol tartrate (LOPRESSOR) 25 MG tablet Take 25 mg by mouth 2 (two) times daily.    . Omega-3 Fatty Acids (FISH OIL) 1000 MG CAPS Take 1,000 mg by mouth daily.    . ondansetron  (ZOFRAN ODT) 4 MG disintegrating tablet Take 1 tablet (4 mg total) by mouth every 8 (eight) hours as needed for nausea or vomiting. 30 tablet 1  . ticagrelor (BRILINTA) 90 MG TABS tablet Take 90 mg by mouth 2 (two) times daily.    . traMADol (ULTRAM) 50 MG tablet Take 1 tablet (50 mg total) by mouth every 12 (twelve) hours as needed for moderate pain or severe pain (M79.3). 28 tablet 0  . nitroGLYCERIN (NITROSTAT) 0.4 MG SL tablet Place 1 tablet (0.4 mg total) under the tongue every 5 (five) minutes x 3 doses as needed for chest pain. 30 tablet 1   No current facility-administered medications for this visit.     Medication Side Effects: None  Allergies:  Allergies  Allergen Reactions  . Other Itching    Darvocet-itching  . Sulfa Antibiotics Itching    Past Medical History:  Diagnosis Date  . Anxiety   . Barrett's esophagus   . CAD (coronary artery disease)    PCI, stent 2011  . Cardiac arrhythmia   . Chicken pox   . Depression   . Diverticulosis   . Gastric ulcer   . GERD (gastroesophageal reflux disease)   . Glaucoma   . History of myocardial infarction   . Hyperlipidemia   . Hypertension   . Kidney stones   . Kidney stones   . Pneumonia 11/2014  . Prostatitis   . Reflux   . Sleep apnea   . UTI (lower urinary tract infection)     Family History  Problem Relation Age of Onset  . Arthritis Mother   . Hyperlipidemia Mother   . Stroke Mother   . Hypertension Mother   . Ovarian cancer Mother   . Hyperlipidemia Father   . Heart disease Father   . Hypertension Father   . Non-Hodgkin's lymphoma Father   . Hyperlipidemia Maternal Grandmother   . Heart disease Maternal Grandmother   . Stroke Maternal Grandmother   . Hypertension Maternal Grandmother   . Diabetes Maternal Grandmother   . Hyperlipidemia Maternal Grandfather   . Heart disease Maternal Grandfather   . Hypertension Maternal Grandfather   . Heart disease Paternal Grandmother   . Heart disease Paternal  Grandfather   . Stroke Paternal Grandfather   . Hypertension Paternal Grandfather   . Colon cancer Neg Hx   . Esophageal cancer Neg Hx   . Pancreatic cancer Neg Hx   . Stomach cancer Neg Hx   . Liver disease Neg Hx     Social History   Socioeconomic History  . Marital status: Divorced    Spouse name: Not on file  . Number of children: 2  . Years of education: 34  . Highest education level: Not on file  Occupational History  . 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 Financial resource strain: Not on file  . Food insecurity:    Worry: Not on  file    Inability: Not on file  . Transportation needs:    Medical: Not on file    Non-medical: Not on file  Tobacco Use  . Smoking status: Former Smoker    Types: Cigarettes  . Smokeless tobacco: Never Used  . Tobacco comment: quit 1987  Substance and Sexual Activity  . Alcohol use: No  . Drug use: No  . Sexual activity: Not Currently    Partners: Female  Lifestyle  . Physical activity:    Days per week: Not on file    Minutes per session: Not on file  . Stress: Not on file  Relationships  . Social connections:    Talks on phone: Not on file    Gets together: Not on file    Attends religious service: Not on file    Active member of club or organization: Not on file    Attends meetings of clubs or organizations: Not on file    Relationship status: Not on file  . Intimate partner violence:    Fear of current or ex partner: Not on file    Emotionally abused: Not on file    Physically abused: Not on file    Forced sexual activity: Not on file  Other Topics Concern  . Not on file  Social History Narrative   Fun: Paint, cook, travel   Denies religious beliefs effecting health care.     Past Medical History, Surgical history, Social history, and Family history were reviewed and updated as appropriate.   Please see review of systems for further details on the patient's review from today.    Objective:   Physical Exam:  BP 118/61   Pulse (!) 59   Physical Exam Constitutional:      General: He is not in acute distress.    Appearance: He is well-developed.  Musculoskeletal:        General: No deformity.  Neurological:     Mental Status: He is alert and oriented to person, place, and time.     Coordination: Coordination normal.  Psychiatric:        Attention and Perception: Attention normal. He does not perceive auditory or visual hallucinations.        Mood and Affect: Mood is anxious. Mood is not depressed. Affect is not labile, blunt, angry or inappropriate.        Speech: Speech normal.        Behavior: Behavior normal.        Thought Content: Thought content normal. Thought content does not include homicidal or suicidal ideation. Thought content does not include homicidal or suicidal plan.        Cognition and Memory: Cognition normal.        Judgment: Judgment normal.     Comments: Insight intact. No auditory or visual hallucinations. No delusions.      Lab Review:     Component Value Date/Time   NA 139 09/03/2018 0924   K 3.7 09/03/2018 0924   CL 109 09/03/2018 0924   CO2 24 09/03/2018 0924   GLUCOSE 112 (H) 09/03/2018 0924   BUN 20 09/03/2018 0924   CREATININE 1.33 09/03/2018 0924   CALCIUM 8.4 09/03/2018 0924   PROT 6.4 (L) 06/23/2015 0520   ALBUMIN 2.6 (L) 06/23/2015 0520   AST 22 06/23/2015 0520   ALT 18 06/23/2015 0520   ALKPHOS 33 (L) 06/23/2015 0520   BILITOT 0.4 06/23/2015 0520   GFRNONAA 56 (L) 06/29/2015 0615   GFRAA >60  06/29/2015 0615       Component Value Date/Time   WBC 12.0 (H) 09/15/2018 1421   RBC 5.04 09/15/2018 1421   HGB 14.6 09/15/2018 1421   HCT 44.2 09/15/2018 1421   PLT 290.0 09/15/2018 1421   MCV 87.5 09/15/2018 1421   MCH 27.5 06/23/2015 0520   MCHC 33.0 09/15/2018 1421   RDW 14.3 09/15/2018 1421   LYMPHSABS 2.7 09/15/2018 1421   MONOABS 0.7 09/15/2018 1421   EOSABS 0.8 (H) 09/15/2018 1421   BASOSABS 0.1  09/15/2018 1421    No results found for: POCLITH, LITHIUM   No results found for: PHENYTOIN, PHENOBARB, VALPROATE, CBMZ   .res Assessment: Plan:    Moderate episode of recurrent major depressive disorder (HCC) - Some recent worsening and depressive signs and symptoms due to situational stressors - Plan: buPROPion (WELLBUTRIN XL) 300 MG 24 hr tablet  Panic disorder - Occasional panic attacks in the context of acute stressors - Plan: clonazePAM (KLONOPIN) 0.5 MG tablet  Primary insomnia - Some recent worsening due to stressors.  Starting BiPAP to improve sleep quality - Plan: clonazePAM (KLONOPIN) 0.5 MG tablet  Please see After Visit Summary for patient specific instructions.  Future Appointments  Date Time Provider [place]  11/06/2018 10:00 AM Shanon Ace, LCSW [facility] None  11/25/2018 11:00 AM Shanon Ace, LCSW [facility] None  12/02/2018 11:00 AM Shanon Ace, LCSW [facility] None  12/09/2018 11:00 AM Shanon Ace, LCSW [facility] None  12/16/2018 11:00 AM Shanon Ace, LCSW [facility] None  12/30/2018 11:00 AM Shanon Ace, LCSW [facility] None  01/13/2019 11:00 AM Shanon Ace, LCSW [facility] None  01/29/2019 10:00 AM Thayer Headings, PMHNP [facility] None    No orders of the defined types were placed in this encounter.     -------------------------------

## 2018-10-30 NOTE — Progress Notes (Signed)
      [facility] Counselor/Therapist Progress Note  Patient ID: Sergio Griffin, MRN: [id number],    Date: 10/30/2018  Time Spent:  60 minutes  Treatment Type: Individual Therapy   Reported Symptoms:   Anxiety, depression, frustration, angry, sometimes hopeless but not suicidal     Mental Status Exam:  Appearance:   Casual     Behavior:  Appropriate and Sharing  Motor:  Normal  Speech/Language:   Normal Rate  Affect:  Congruent  Mood:  anxious and depressed  Thought process:  normal  Thought content:    WNL  Sensory/Perceptual disturbances:    WNL  Orientation:  oriented to person, place, time/date, situation, day of week, month of year and year  Attention:  Good  Concentration:  Good  Memory:  WNL  Fund of knowledge:   Good  Insight:    Good  Judgment:   Good  Impulse Control:  Good   Risk Assessment: Danger to Self:  No Self-injurious Behavior: No Danger to Others: No Duty to Warn:no Physical Aggression / Violence:No  Access to Firearms a concern: No  Gang Involvement:No   Subjective:  Patient in today with symptoms of anxiety and depression, frustration, anger, and some hopelessness occasionally without any suicidal ideation.  Family issues between patient, brother, and elderly mother have increased some and patient tries to be helpful but stay "neutral". Hard to deal with his frustration and hard to set limits.  Realizing his mom is having some cognitive issues at this point in her life which is exaggerating some of her symptoms that she has had most of her life---"micro-managing, overly controlling, manipulative", which make care-giving difficult for patient to help her.  Discussed patient have better boundaries for his own self-care.  Also worked on some communication strategies that might be helpful with mother and brother.  It does seem that mother's cognitive decline is affecting her communication and behavior with patient, which adds some emotional difficulty  for patient.  Interventions: Cognitive Behavioral Therapy and Ego-Supportive  Diagnosis:   ICD-10-CM   1. Major depressive disorder, recurrent, severe without psychotic features ([place]) F33.2     Plan: Patient to follow through on setting appropriate boundaries with his mother and brother.  Wants to be helpful but also recognizes his need to step back from situations as needed to support and preserve his emotional stability, so that he has the strength needed to provide help when needed for his mother.  Shanon Ace, LCSW

## 2018-11-06 ENCOUNTER — Ambulatory Visit (INDEPENDENT_AMBULATORY_CARE_PROVIDER_SITE_OTHER): Payer: Medicare Other | Admitting: Psychiatry

## 2018-11-06 DIAGNOSIS — F331 Major depressive disorder, recurrent, moderate: Secondary | ICD-10-CM | POA: Diagnosis not present

## 2018-11-06 NOTE — Progress Notes (Signed)
      [facility] Counselor/Therapist Progress Note  Patient ID: Sergio Griffin, MRN: [id number],    Date: 11/06/2018  Time Spent: 58 minutes  Treatment Type: Individual Therapy  Reported Symptoms:   Anxious, sad, depressed, anger  Mental Status Exam:  Appearance:   Casual     Behavior:  Appropriate and Sharing  Motor:  Normal  Speech/Language:   Normal Rate  Affect:  Congruent  Mood:    Thought process:  normal  Thought content:    WNL  Sensory/Perceptual disturbances:    WNL  Orientation:  oriented to person, place, time/date, situation, day of week, month of year and year  Attention:  Good  Concentration:  Good  Memory:  WNL  Fund of knowledge:   Good  Insight:    Good  Judgment:   Good  Impulse Control:  Good   Risk Assessment: Danger to Self:  No Self-injurious Behavior: No Danger to Others: No Duty to Warn:no Physical Aggression / Violence:No  Access to Firearms a concern: No  Gang Involvement:No   Subjective:   Patient in today feeling sad, anxious, angry, and depressed.  "What's really on my mind today  Is Christmas and adult kids, and my mother and brother's health.  Daughter is pregnant and due end of March 2020.  Patient sharing family plans for holidays which are somewhat different than in prior years and he is grieving that some.  Also taps into his feeling "single and alone".  Tendency to look for what might go wrong versus right, and to feel that "things are about him' that really are not.  Lots of assumptions and does recognize this when pointed out.  On a depression scale fo 1-10 (with 10 being the highest, patient rates himself as a "7" today and a week ago it was a "9".  Stressors with mother and brother's help is also an issue that stresses him.  Reviewed strategies for managing stress and anxiety, in addition to setting healthy limits with other people.  Interventions: Cognitive Behavioral Therapy and Ego-Supportive  Diagnosis:   ICD-10-CM   1.  Moderate episode of recurrent major depressive disorder ([place]) F33.1     Plan:  Patient to follow through on strategies and changes talked about in session today.  Left with some more motivation and desire to change the things that he can change and work to accept what he cannot change.    Shanon Ace, LCSW

## 2018-11-25 ENCOUNTER — Ambulatory Visit (INDEPENDENT_AMBULATORY_CARE_PROVIDER_SITE_OTHER): Payer: Medicare Other | Admitting: Psychiatry

## 2018-11-25 DIAGNOSIS — F331 Major depressive disorder, recurrent, moderate: Secondary | ICD-10-CM | POA: Diagnosis not present

## 2018-11-25 NOTE — Progress Notes (Signed)
      [facility] Counselor/Therapist Progress Note  Patient ID: Sergio Griffin, MRN: [id number],    Date: 11/25/2018  Time Spent: 60 minutes  Treatment Type: Individual Therapy  Reported Symptoms:  Depressed, anger, anxiety, hurt , some tearfulness  Mental Status Exam:  Appearance:   Casual     Behavior:  Appropriate and Sharing  Motor:  Normal  Speech/Language:   Normal Rate  Affect:  Congruent  Mood:  angry, anxious and depressed  Thought process:  normal  Thought content:    WNL  Sensory/Perceptual disturbances:    WNL  Orientation:  oriented to person, place, time/date, situation, day of week, month of year and year  Attention:  Good  Concentration:  Good  Memory:  WNL  Fund of knowledge:   Good  Insight:    Fair  Judgment:   Good  Impulse Control:  Good   Risk Assessment: Danger to Self:  No Self-injurious Behavior: No Danger to Others: No Duty to Warn:no Physical Aggression / Violence:No  Access to Firearms a concern: No  Gang Involvement:No   Subjective: Lots of stress and frustration over the holidays especially with family.  Discussed in detail as he states "I can't talk to others about this".  Excessive guilt because of the way he feels towards some family members.  Long history of hurt, anger, and being taken advantage of within the family. Realizes he gets manipulated by some family members but has not yet set limits.  Patient needing to address issues of forgiveness in order to move forward. Agrees to consider working harder on forgiveness, setting limits re: how others "make me angry", and claiming his power to make some needed changes.    Interventions: Solution-Oriented/Positive Psychology and Ego-Supportive  Diagnosis:   ICD-10-CM   1. Moderate episode of recurrent major depressive disorder ([place]) F33.1     Plan:   Patient to do some writing between sessions re: forgiveness issues regarding past hurts and to practice healthier boundaries within  family, per strategies reviewed in session.  Will see again next week.  Shanon Ace, LCSW

## 2018-12-02 ENCOUNTER — Ambulatory Visit (INDEPENDENT_AMBULATORY_CARE_PROVIDER_SITE_OTHER): Payer: Medicare Other | Admitting: Psychiatry

## 2018-12-02 DIAGNOSIS — F331 Major depressive disorder, recurrent, moderate: Secondary | ICD-10-CM | POA: Diagnosis not present

## 2018-12-02 NOTE — Progress Notes (Signed)
      [facility] Counselor/Therapist Progress Note  Patient ID: Sergio Griffin, MRN: [id number],    Date: 12/02/2018  Time Spent: 60 minutes  Treatment Type:   Individual Therapy  Reported Symptoms:  Anxiety, anger, frustration, sadness  Mental Status Exam:  Appearance:   Casual     Behavior:  Appropriate and Sharing  Motor:  Normal  Speech/Language:   Normal Rate  Affect:  Congruent  Mood:  anxious and dysthymic  Thought process:  normal  Thought content:    WNL  Sensory/Perceptual disturbances:    WNL  Orientation:  oriented to person, place, time/date, situation, day of week, month of year and year  Attention:  Good  Concentration:  Good  Memory:  WNL  Fund of knowledge:   Good  Insight:    Fair  Judgment:   Good  Impulse Control:  Good   Risk Assessment: Danger to Self:  No Self-injurious Behavior: No Danger to Others: No Duty to Warn:no Physical Aggression / Violence:No  Access to Firearms a concern: No  Gang Involvement:No   Subjective:  Patient in today with anxiety, some anger and depressed mood.  Appearing "dysthymic".  Still working on his anger towards mother and brother.  With Mother, I want to help her but she makes it "so damn hard", but trying to "guilt me" about random things.  Discussed how mom intervenes and tends to turn it towards herself.  Feels his mom "baits him a lot" and it's hard not to fall into negative communication with her.  Problem-solved some situations he runs into frequently, and role-played some situations that involve patient and his mom. Looked at different ways of really listening to mom and then tactfully responding to her.  Talked about the importance of having patience with situation and with elderly mom and with himself.  Very aware of his anger but also his sadness in his relationship with mom.  Discussed and shared drawn out model of CBT techniques that can help him in reframing some of his negative thoughts and also enabled him  to better realize the connection between his thoughts, his feelings, and his resulting behaviors.   Interventions: Cognitive Behavioral Therapy and Ego-Supportive  Diagnosis:   ICD-10-CM   1. Moderate episode of recurrent major depressive disorder ([place]) F33.1     Plan:  Patient to follow through and practice CBT strategies especially in dealing with elderly mother and brother.  To return in one week.  Shanon Ace, LCSW

## 2018-12-09 ENCOUNTER — Ambulatory Visit (INDEPENDENT_AMBULATORY_CARE_PROVIDER_SITE_OTHER): Payer: Medicare Other | Admitting: Psychiatry

## 2018-12-09 DIAGNOSIS — F331 Major depressive disorder, recurrent, moderate: Secondary | ICD-10-CM

## 2018-12-09 NOTE — Progress Notes (Signed)
      [facility] Counselor/Therapist Progress Note  Patient ID: Sergio Griffin, MRN: [id number],    Date: 12/09/2018  Time Spent:  60 minutes  Treatment Type: Individual Therapy  Reported Symptoms: some depressed mood, anxious, frustrated, stressed  Mental Status Exam:  Appearance:   Neat     Behavior:  Appropriate and Sharing  Motor:  Normal  Speech/Language:   Normal Rate  Affect:  Depressed and irritated, anxious, frustrated  Mood:  anxious, depressed and irritable  Thought process:  normal  Thought content:    WNL  Sensory/Perceptual disturbances:    WNL  Orientation:  oriented to person, place, time/date, situation, day of week, month of year and year  Attention:  Good  Concentration:  Good  Memory:  WNL  Fund of knowledge:   Good  Insight:    Fair  Judgment:   Good  Impulse Control:  Good   Risk Assessment: Danger to Self:  No Self-injurious Behavior: No Danger to Others: No Duty to Warn:no Physical Aggression / Violence:No  Access to Firearms a concern: No  Gang Involvement:No   Subjective:  Patient in today with symptoms of anxiety, depressed mood, stressed, and frustrated.  Frustration and stressed by [employer] attorney re: former property that he previously owned, and is working to get situation resolved.  Ongoing stressors with elderly mom and brother who is difficult for patient to communicate with.    Still learning and getting adjusted to GF of son, and making progress on this which is very important to patient because of his closeness with son.  Lots of anticipatory grief on son moving out despite him being happy for son and GF.  Also has had 1  coworker friend who died recently, and another one in Hospice.   Patient does report using the CBT strategies and diagram shared last session and that has been helpful.    Interventions: Cognitive Behavioral Therapy and Ego-Supportive  Diagnosis:   ICD-10-CM   1. Moderate episode of recurrent major  depressive disorder ([place]) F33.1     Plan:  Encouraged good self-care and to follow up with the CBT strategies for continued use in several areas in which he is working on.    Shanon Ace, LCSW

## 2018-12-16 ENCOUNTER — Ambulatory Visit: Payer: 59 | Admitting: Psychiatry

## 2018-12-30 ENCOUNTER — Ambulatory Visit (INDEPENDENT_AMBULATORY_CARE_PROVIDER_SITE_OTHER): Payer: Medicare Other | Admitting: Psychiatry

## 2018-12-30 DIAGNOSIS — F331 Major depressive disorder, recurrent, moderate: Secondary | ICD-10-CM | POA: Diagnosis not present

## 2018-12-30 NOTE — Progress Notes (Signed)
      [facility] Counselor/Therapist Progress Note  Patient ID: Sergio Griffin, MRN: [id number],    Date: 12/30/2018  Time Spent:  58 minutes  Treatment Type: Individual Therapy  Reported Symptoms: some depressed mood, anxious, frustrated, stressed,sadness  Mental Status Exam:  Appearance:   Neat     Behavior:  Appropriate and Sharing  Motor:  Normal  Speech/Language:   Normal Rate  Affect:  Depressed and irritated, anxious, frustrated  Mood:  anxious, depressed and irritable  Thought process:  normal  Thought content:    WNL  Sensory/Perceptual disturbances:    WNL  Orientation:  oriented to person, place, time/date, situation, day of week, month of year and year  Attention:  Good  Concentration:  Good  Memory:  WNL  Fund of knowledge:   Good  Insight:    Fair  Judgment:   Good  Impulse Control:  Good   Risk Assessment: Danger to Self:  No Self-injurious Behavior: No Danger to Others: No Duty to Warn:no Physical Aggression / Violence:No  Access to Firearms a concern: No  Gang Involvement:No   Subjective:   Patient sick most recently with upper respiratory symptoms.  Seems to be fine past 3 days per his report.  Patient in today with symptoms stated above, and especially sadness.  Has had 3 long time friends die within the past couple wks. Processes these losses well during session, each of them having a special connection with patient. With these recent deaths, patient has lost a total of 6 long time friends since beginning of 2020.  Ongoing stressors with elderly mom and brother who is difficult for patient to communicate with. Frequent stressors in both of these 2 relationships.   Still learning and getting adjusted to GF of son, and making progress on this which is very important to patient because of his closeness with son.  Lots of anticipatory grief on son moving out despite him being happy for son and GF.  Also has had 1  coworker friend who died recently, and  another one in Hospice.   Patient does report using the CBT strategies and diagram shared last session and that has been helpful.    Interventions: Cognitive Behavioral Therapy and Ego-Supportive  Diagnosis:   ICD-10-CM   1. Moderate episode of recurrent major depressive disorder ([place]) F33.1     Plan:  Encouraged good self-care and to follow up with the CBT strategies for continued use in several areas in which he is working on.    Shanon Ace, LCSW

## 2019-01-13 ENCOUNTER — Ambulatory Visit (INDEPENDENT_AMBULATORY_CARE_PROVIDER_SITE_OTHER): Payer: Medicare Other | Admitting: Psychiatry

## 2019-01-13 DIAGNOSIS — F331 Major depressive disorder, recurrent, moderate: Secondary | ICD-10-CM | POA: Diagnosis not present

## 2019-01-13 NOTE — Progress Notes (Signed)
      [facility] Counselor/Therapist Progress Note  Patient ID: Sergio Griffin, MRN: [id number],    Date: 01/13/2019  Time Spent:  60 minutes  Treatment Type: Individual Therapy  Reported Symptoms: depressed mood, anxious, frustrated, stressed,sadness  Mental Status Exam:  Appearance:   Neat     Behavior:  Appropriate and Sharing  Motor:  Normal  Speech/Language:   Normal Rate  Affect:  Depressed and irritated, anxious, frustrated  Mood:  anxious, depressed and irritable  Thought process:  normal  Thought content:    WNL  Sensory/Perceptual disturbances:    WNL  Orientation:  oriented to person, place, time/date, situation, day of week, month of year and year  Attention:  Good  Concentration:  Good  Memory:  WNL  Fund of knowledge:   Good  Insight:    Fair  Judgment:   Good  Impulse Control:  Good   Risk Assessment: Danger to Self:  No Self-injurious Behavior: No Danger to Others: No Duty to Warn:no Physical Aggression / Violence:No  Access to Firearms a concern: No  Gang Involvement:No   Subjective:   "My symptoms are about the same but my focus has changed some. I'm starting to realize I can't change my elderly mother nor my brother and I've begun at least a little bit to work on" letting go" some as we've talked in sessions."  Some lessened anxiety, less depressed, let sadness, less frustration.  Does seem to have a little more energy.  Visited pregnant daughter this past week and that was good, as she is getting closer to her delivery date towards end of March. Getting excited about the upcoming arrival of grandchild.   Discusses the differences he sees in himself and some of his self-consciousness.  Shares some of his thinking about aging, and are often judgmental.  "I don't see myself as old, and I'm terrified of getting old". Starting to feel older and the years "seem more real to me". Talking really open about the aging issue and his fears but also able to add  in some humor. Seemed to feel relief as he states he doesn't really have other people to talk with about this subject.  Patient does report using the CBT strategies and diagram shared last session and that has been helpful.  Continues to decrease his "people pleasing".  Interventions: Cognitive Behavioral Therapy and Ego-Supportive  Diagnosis:   ICD-10-CM   1. Moderate episode of recurrent major depressive disorder ([facility]) F33.1     Plan:  Affirmed his openness in talking about difficult subject matter for him today.  Encouraged good self-care and to follow up with the CBT strategies for continued use in several areas in which he is working on, including his thoughts/feelings about aging.  Shanon Ace, LCSW

## 2019-01-22 DIAGNOSIS — L720 Epidermal cyst: Secondary | ICD-10-CM | POA: Diagnosis not present

## 2019-01-22 DIAGNOSIS — D485 Neoplasm of uncertain behavior of skin: Secondary | ICD-10-CM | POA: Diagnosis not present

## 2019-01-22 DIAGNOSIS — Z85828 Personal history of other malignant neoplasm of skin: Secondary | ICD-10-CM | POA: Diagnosis not present

## 2019-01-22 DIAGNOSIS — L2089 Other atopic dermatitis: Secondary | ICD-10-CM | POA: Diagnosis not present

## 2019-01-22 DIAGNOSIS — L820 Inflamed seborrheic keratosis: Secondary | ICD-10-CM | POA: Diagnosis not present

## 2019-01-22 DIAGNOSIS — L218 Other seborrheic dermatitis: Secondary | ICD-10-CM | POA: Diagnosis not present

## 2019-01-22 DIAGNOSIS — Z08 Encounter for follow-up examination after completed treatment for malignant neoplasm: Secondary | ICD-10-CM | POA: Diagnosis not present

## 2019-01-27 ENCOUNTER — Ambulatory Visit (INDEPENDENT_AMBULATORY_CARE_PROVIDER_SITE_OTHER): Payer: Medicare Other | Admitting: Psychiatry

## 2019-01-27 DIAGNOSIS — F331 Major depressive disorder, recurrent, moderate: Secondary | ICD-10-CM

## 2019-01-27 NOTE — Progress Notes (Signed)
      [facility] Counselor/Therapist Progress Note  Patient ID: Sergio Griffin, MRN: [id number],    Date: 01/27/2019  Time Spent:  30 minutes  Treatment Type: Individual Therapy  Reported Symptoms: depressed mood, anxious, frustrated, stressed  Mental Status Exam:  Appearance:   Neat     Behavior:  Appropriate and Sharing  Motor:  Normal  Speech/Language:   Normal Rate  Affect:  Depressed and irritated, anxious, frustrated  Mood:  anxious, depressed and irritable  Thought process:  normal  Thought content:    WNL  Sensory/Perceptual disturbances:    WNL  Orientation:  oriented to person, place, time/date, situation, day of week, month of year and year  Attention:  Good  Concentration:  Good  Memory:  WNL  Fund of knowledge:   Good  Insight:    Fair  Judgment:   Good  Impulse Control:  Good   Risk Assessment: Danger to Self:  No Self-injurious Behavior: No Danger to Others: No Duty to Warn:no Physical Aggression / Violence:No  Access to Firearms a concern: No  Gang Involvement:No   Subjective:    Patient arrived late today for appt as he was mixed up on time.  A bit upset at himself but I reassured him we still had time to meet for a shorter session (30 minutes).  Does report some difficulty sleeping at night every night.  Is not doing anything different as far as timing of going ot bed, but responded also that he's had more dreams/nightmares.  Dreams sometimes involve where he used to work "but nothing alarming".  Low energy and tires easily.  "My symptoms are about the same but my focus has changed some. I'm starting to realize I can't change my elderly mother nor my brother and I've begun at least a little bit to work on" letting go" some as we've talked in sessions."    Had good visit recently with pregnant daughter as she is getting closer to her delivery date towards end of March. Getting excited about the upcoming arrival of grandchild.    Shares more of his  thinking about aging, and are often judgmental.  "I don't see myself as old, and I'm terrified of getting old". Starting to feel older and the years "seem more real to me". Talking really open about the aging issue and his fears but also able to add in some humor. Seemed to feel relief as he states he doesn't really have other people to talk with about this "sensitive" subject.  Patient does report using the CBT strategies and diagram shared in a prior session and that has been helpful.    Interventions: Cognitive Behavioral Therapy and Ego-Supportive  Diagnosis:   ICD-10-CM   1. Moderate episode of recurrent major depressive disorder ([place]) F33.1     Plan:   Encouraged good self-care and to follow up with the CBT strategies for continued use in several areas in which he is working on, including his thoughts/feelings about aging.  Shanon Ace, LCSW

## 2019-01-29 ENCOUNTER — Ambulatory Visit: Payer: Medicare Other | Admitting: Psychiatry

## 2019-01-29 DIAGNOSIS — I130 Hypertensive heart and chronic kidney disease with heart failure and stage 1 through stage 4 chronic kidney disease, or unspecified chronic kidney disease: Secondary | ICD-10-CM | POA: Diagnosis not present

## 2019-01-29 DIAGNOSIS — I214 Non-ST elevation (NSTEMI) myocardial infarction: Secondary | ICD-10-CM | POA: Diagnosis not present

## 2019-01-29 DIAGNOSIS — N179 Acute kidney failure, unspecified: Secondary | ICD-10-CM | POA: Diagnosis not present

## 2019-01-29 DIAGNOSIS — I200 Unstable angina: Secondary | ICD-10-CM | POA: Diagnosis not present

## 2019-01-29 DIAGNOSIS — I219 Acute myocardial infarction, unspecified: Secondary | ICD-10-CM

## 2019-01-29 DIAGNOSIS — I10 Essential (primary) hypertension: Secondary | ICD-10-CM | POA: Diagnosis not present

## 2019-01-29 DIAGNOSIS — F329 Major depressive disorder, single episode, unspecified: Secondary | ICD-10-CM | POA: Diagnosis not present

## 2019-01-29 DIAGNOSIS — R079 Chest pain, unspecified: Secondary | ICD-10-CM | POA: Diagnosis not present

## 2019-01-29 DIAGNOSIS — I509 Heart failure, unspecified: Secondary | ICD-10-CM | POA: Diagnosis not present

## 2019-01-29 DIAGNOSIS — R0789 Other chest pain: Secondary | ICD-10-CM | POA: Diagnosis not present

## 2019-01-29 DIAGNOSIS — Z0289 Encounter for other administrative examinations: Secondary | ICD-10-CM | POA: Diagnosis not present

## 2019-01-29 DIAGNOSIS — E785 Hyperlipidemia, unspecified: Secondary | ICD-10-CM | POA: Diagnosis not present

## 2019-01-29 DIAGNOSIS — N183 Chronic kidney disease, stage 3 (moderate): Secondary | ICD-10-CM | POA: Diagnosis not present

## 2019-01-29 DIAGNOSIS — F419 Anxiety disorder, unspecified: Secondary | ICD-10-CM | POA: Diagnosis not present

## 2019-01-29 DIAGNOSIS — E860 Dehydration: Secondary | ICD-10-CM | POA: Diagnosis not present

## 2019-01-29 HISTORY — DX: Acute myocardial infarction, unspecified: I21.9

## 2019-01-30 DIAGNOSIS — E119 Type 2 diabetes mellitus without complications: Secondary | ICD-10-CM | POA: Diagnosis not present

## 2019-01-30 DIAGNOSIS — F329 Major depressive disorder, single episode, unspecified: Secondary | ICD-10-CM | POA: Diagnosis not present

## 2019-01-30 DIAGNOSIS — I2510 Atherosclerotic heart disease of native coronary artery without angina pectoris: Secondary | ICD-10-CM | POA: Diagnosis not present

## 2019-01-30 DIAGNOSIS — I130 Hypertensive heart and chronic kidney disease with heart failure and stage 1 through stage 4 chronic kidney disease, or unspecified chronic kidney disease: Secondary | ICD-10-CM | POA: Diagnosis not present

## 2019-01-30 DIAGNOSIS — I509 Heart failure, unspecified: Secondary | ICD-10-CM | POA: Diagnosis not present

## 2019-01-30 DIAGNOSIS — Z955 Presence of coronary angioplasty implant and graft: Secondary | ICD-10-CM | POA: Diagnosis not present

## 2019-01-30 DIAGNOSIS — I214 Non-ST elevation (NSTEMI) myocardial infarction: Secondary | ICD-10-CM | POA: Diagnosis not present

## 2019-01-30 DIAGNOSIS — E860 Dehydration: Secondary | ICD-10-CM | POA: Diagnosis not present

## 2019-01-30 DIAGNOSIS — F419 Anxiety disorder, unspecified: Secondary | ICD-10-CM | POA: Diagnosis not present

## 2019-01-30 DIAGNOSIS — N179 Acute kidney failure, unspecified: Secondary | ICD-10-CM | POA: Diagnosis not present

## 2019-01-30 DIAGNOSIS — E785 Hyperlipidemia, unspecified: Secondary | ICD-10-CM | POA: Diagnosis not present

## 2019-01-30 DIAGNOSIS — I200 Unstable angina: Secondary | ICD-10-CM | POA: Diagnosis not present

## 2019-01-30 DIAGNOSIS — I2582 Chronic total occlusion of coronary artery: Secondary | ICD-10-CM | POA: Diagnosis not present

## 2019-01-30 DIAGNOSIS — R0789 Other chest pain: Secondary | ICD-10-CM | POA: Diagnosis not present

## 2019-01-30 DIAGNOSIS — I10 Essential (primary) hypertension: Secondary | ICD-10-CM | POA: Diagnosis not present

## 2019-01-30 DIAGNOSIS — N183 Chronic kidney disease, stage 3 (moderate): Secondary | ICD-10-CM | POA: Diagnosis not present

## 2019-01-31 DIAGNOSIS — I2510 Atherosclerotic heart disease of native coronary artery without angina pectoris: Secondary | ICD-10-CM | POA: Diagnosis not present

## 2019-01-31 DIAGNOSIS — I517 Cardiomegaly: Secondary | ICD-10-CM | POA: Diagnosis not present

## 2019-01-31 DIAGNOSIS — Z79899 Other long term (current) drug therapy: Secondary | ICD-10-CM | POA: Diagnosis not present

## 2019-01-31 DIAGNOSIS — I214 Non-ST elevation (NSTEMI) myocardial infarction: Secondary | ICD-10-CM | POA: Diagnosis present

## 2019-01-31 DIAGNOSIS — Z8379 Family history of other diseases of the digestive system: Secondary | ICD-10-CM | POA: Diagnosis not present

## 2019-01-31 DIAGNOSIS — K219 Gastro-esophageal reflux disease without esophagitis: Secondary | ICD-10-CM | POA: Diagnosis present

## 2019-01-31 DIAGNOSIS — I509 Heart failure, unspecified: Secondary | ICD-10-CM | POA: Diagnosis present

## 2019-01-31 DIAGNOSIS — R079 Chest pain, unspecified: Secondary | ICD-10-CM | POA: Diagnosis not present

## 2019-01-31 DIAGNOSIS — Z9089 Acquired absence of other organs: Secondary | ICD-10-CM | POA: Diagnosis not present

## 2019-01-31 DIAGNOSIS — Z7982 Long term (current) use of aspirin: Secondary | ICD-10-CM | POA: Diagnosis not present

## 2019-01-31 DIAGNOSIS — I371 Nonrheumatic pulmonary valve insufficiency: Secondary | ICD-10-CM | POA: Diagnosis not present

## 2019-01-31 DIAGNOSIS — Z955 Presence of coronary angioplasty implant and graft: Secondary | ICD-10-CM | POA: Diagnosis not present

## 2019-01-31 DIAGNOSIS — Z882 Allergy status to sulfonamides status: Secondary | ICD-10-CM | POA: Diagnosis not present

## 2019-01-31 DIAGNOSIS — I10 Essential (primary) hypertension: Secondary | ICD-10-CM | POA: Diagnosis not present

## 2019-01-31 DIAGNOSIS — Z885 Allergy status to narcotic agent status: Secondary | ICD-10-CM | POA: Diagnosis not present

## 2019-01-31 DIAGNOSIS — N183 Chronic kidney disease, stage 3 (moderate): Secondary | ICD-10-CM | POA: Diagnosis present

## 2019-01-31 DIAGNOSIS — E785 Hyperlipidemia, unspecified: Secondary | ICD-10-CM | POA: Diagnosis present

## 2019-01-31 DIAGNOSIS — F329 Major depressive disorder, single episode, unspecified: Secondary | ICD-10-CM | POA: Diagnosis present

## 2019-01-31 DIAGNOSIS — N179 Acute kidney failure, unspecified: Secondary | ICD-10-CM | POA: Diagnosis present

## 2019-01-31 DIAGNOSIS — F419 Anxiety disorder, unspecified: Secondary | ICD-10-CM | POA: Diagnosis present

## 2019-01-31 DIAGNOSIS — Z886 Allergy status to analgesic agent status: Secondary | ICD-10-CM | POA: Diagnosis not present

## 2019-01-31 DIAGNOSIS — E860 Dehydration: Secondary | ICD-10-CM | POA: Diagnosis not present

## 2019-01-31 DIAGNOSIS — I200 Unstable angina: Secondary | ICD-10-CM | POA: Diagnosis present

## 2019-01-31 DIAGNOSIS — I130 Hypertensive heart and chronic kidney disease with heart failure and stage 1 through stage 4 chronic kidney disease, or unspecified chronic kidney disease: Secondary | ICD-10-CM | POA: Diagnosis present

## 2019-01-31 DIAGNOSIS — E119 Type 2 diabetes mellitus without complications: Secondary | ICD-10-CM | POA: Diagnosis not present

## 2019-02-01 DIAGNOSIS — I214 Non-ST elevation (NSTEMI) myocardial infarction: Secondary | ICD-10-CM | POA: Diagnosis not present

## 2019-02-01 DIAGNOSIS — I10 Essential (primary) hypertension: Secondary | ICD-10-CM | POA: Diagnosis not present

## 2019-02-01 DIAGNOSIS — I2510 Atherosclerotic heart disease of native coronary artery without angina pectoris: Secondary | ICD-10-CM | POA: Diagnosis not present

## 2019-02-01 DIAGNOSIS — Z955 Presence of coronary angioplasty implant and graft: Secondary | ICD-10-CM | POA: Diagnosis not present

## 2019-02-03 DIAGNOSIS — H401133 Primary open-angle glaucoma, bilateral, severe stage: Secondary | ICD-10-CM | POA: Diagnosis not present

## 2019-02-09 DIAGNOSIS — I214 Non-ST elevation (NSTEMI) myocardial infarction: Secondary | ICD-10-CM | POA: Diagnosis not present

## 2019-02-09 DIAGNOSIS — Z9861 Coronary angioplasty status: Secondary | ICD-10-CM | POA: Diagnosis not present

## 2019-02-09 DIAGNOSIS — I208 Other forms of angina pectoris: Secondary | ICD-10-CM | POA: Diagnosis not present

## 2019-02-09 DIAGNOSIS — I2510 Atherosclerotic heart disease of native coronary artery without angina pectoris: Secondary | ICD-10-CM | POA: Diagnosis not present

## 2019-02-10 ENCOUNTER — Ambulatory Visit: Payer: 59 | Admitting: Psychiatry

## 2019-02-24 ENCOUNTER — Ambulatory Visit (INDEPENDENT_AMBULATORY_CARE_PROVIDER_SITE_OTHER): Payer: Medicare Other | Admitting: Psychiatry

## 2019-02-24 ENCOUNTER — Other Ambulatory Visit: Payer: Self-pay

## 2019-02-24 DIAGNOSIS — F331 Major depressive disorder, recurrent, moderate: Secondary | ICD-10-CM | POA: Diagnosis not present

## 2019-02-24 NOTE — Progress Notes (Addendum)
[facility] Counselor/Therapist Progress Note  Patient ID: Sergio Griffin, MRN: [id number],    Date: 02/24/2019  Time Spent:  60 minutes         10:58am to 11:58am  Treatment Type: Individual Therapy   Virtual Visit via Video Note:  I connected with patient by a video enabled telemedicine application or telephone, with their informed consent, and verified patient privacy and that I am speaking with the correct person using two identifiers. I am at [place] and patient is at his home.   I discussed the limitations, risks, security and privacy concerns of performing psychotherapy and management service by telephone and the availability of in person appointments. I also discussed with the patient that there may be a patient responsible charge related to this service. The patient expressed understanding and agreed to proceed.  I discussed the treatment planning with the patient. The patient was provided an opportunity to ask questions and all were answered. The patient agreed with the plan and demonstrated an understanding of the instructions.   The patient was advised to call  our office if  symptoms worsen or feel they are in a crisis state and need immediate contact.   Reported Symptoms: Patient had heart attack since last appt,, depressed mood, anxious, frustrated, stressed  Mental Status Exam:  Appearance:    casual  Behavior:  Sharing  Motor:  Normal  Speech/Language:   Normal Rate  Affect:  anxious  Mood:  anxious, depressed and irritable  Thought process:  normal  Thought content:    WNL  Sensory/Perceptual disturbances:    WNL  Orientation:  oriented to person, place, time/date, situation, day of week, month of year and year  Attention:  Good  Concentration:  Good  Memory:  WNL  Fund of knowledge:   Good  Insight:    Fair  Judgment:   Good  Impulse Control:  Good   Risk Assessment: Danger to Self:  No Self-injurious Behavior: No Danger to Others:  No Duty to Warn:no Physical Aggression / Violence:No  Access to Firearms a concern: No  Gang Involvement:No   Subjective:    Patient today reports symptoms of anxiety, stress, depression, and frustration.   Patient had a heart attack since his last appt and was hospitalized at [facility] (formerly [facility]  Has not begun his rehab yet as they are waiting to determine where (due to coronavirus concerns). Processed his concerns about this whole experience--uncertainties and some increased anxiety.  He did share that having a heart attack and dealing with the coronavirus made him think back to some of our previous conversations about coping skills for managing anxiety and depression.    I'm still realizing that I can't change my elderly mother nor my brother and because of that I need to keep working on" letting go" as we've talked in sessions."  Concerned re: son's girlfriend and relationship with her, mostly around communication issues.  Discussed some communication skills that patient may find helpful in interacting with son's girlfriend, especially in active listening skills and how we interpret and possible misinterpret information and clarification to be sure we heard the other person correctly.     Also discussed his concerns about the coronavirus which had built up over time recently and he was unable to come to office for appt. so was glad for [place] to connect today.  Sounds like he is following all the virus precautions, and honoring the social distancing recommended.  Patient does continue using the CBT strategies and diagram shared in a prior session and that has been helpful.    Interventions: Cognitive Behavioral Therapy and Ego-Supportive  Diagnosis:   ICD-10-CM   1. Moderate episode of recurrent major depressive disorder ([place]) F33.1     Plan:   Encouraged good self-care and adhering to virus precautions, as well as following up with the CBT strategies for  continued use in several areas noted above.  Next appt within 2 wks.  Shanon Ace, LCSW

## 2019-03-03 ENCOUNTER — Other Ambulatory Visit: Payer: Self-pay

## 2019-03-03 ENCOUNTER — Ambulatory Visit (INDEPENDENT_AMBULATORY_CARE_PROVIDER_SITE_OTHER): Payer: Medicare Other | Admitting: Psychiatry

## 2019-03-03 DIAGNOSIS — F331 Major depressive disorder, recurrent, moderate: Secondary | ICD-10-CM

## 2019-03-03 DIAGNOSIS — R9431 Abnormal electrocardiogram [ECG] [EKG]: Secondary | ICD-10-CM | POA: Diagnosis not present

## 2019-03-03 DIAGNOSIS — I10 Essential (primary) hypertension: Secondary | ICD-10-CM | POA: Diagnosis not present

## 2019-03-03 DIAGNOSIS — I25119 Atherosclerotic heart disease of native coronary artery with unspecified angina pectoris: Secondary | ICD-10-CM | POA: Diagnosis not present

## 2019-03-03 DIAGNOSIS — E782 Mixed hyperlipidemia: Secondary | ICD-10-CM | POA: Diagnosis not present

## 2019-03-03 NOTE — Progress Notes (Signed)
[facility] Counselor/Therapist Progress Note  Patient ID: LESHAWN STRAKA, MRN: [id number],    Date: 03/03/2019  Time Spent:  60 minutes         9:00am to 10:00am  Treatment Type: Individual Therapy   Virtual Visit via Video Note:  I connected with patient by a video enabled telemedicine application or telephone, with their informed consent, and verified patient privacy and that I am speaking with the correct person using two identifiers. I am at [place] and patient is at his home.   I discussed the limitations, risks, security and privacy concerns of performing psychotherapy and management service by telephone and the availability of in person appointments. I also discussed with the patient that there may be a patient responsible charge related to this service. The patient expressed understanding and agreed to proceed.  I discussed the treatment planning with the patient. The patient was provided an opportunity to ask questions and all were answered. The patient agreed with the plan and demonstrated an understanding of the instructions.   The patient was advised to call  our office if  symptoms worsen or feel they are in a crisis state and need immediate contact.   Reported Symptoms: Patient continuing to recover from heart attack,  depressed mood, anxious, stressed  Mental Status Exam:  Appearance:    casual  Behavior:  Sharing  Motor:  Normal  Speech/Language:   Normal Rate  Affect:  anxious  Mood:  Anxious, some depressed mood  Thought process:  normal  Thought content:    WNL  Sensory/Perceptual disturbances:    WNL  Orientation:  oriented to person, place, time/date, situation, day of week, month of year and year  Attention:  Good  Concentration:  Good  Memory:  WNL  Fund of knowledge:   Good  Insight:    Fair  Judgment:   Good  Impulse Control:  Good   Risk Assessment: Danger to Self:  No Self-injurious Behavior: No Danger to Others: No Duty  to Warn:no Physical Aggression / Violence:No  Access to Firearms a concern: No  Gang Involvement:No   Subjective:   Patient reports symptoms of anxiety, stress, and depression.   Patient continues to heal from his recent heart attack. Processed  this whole experience--uncertainties and some increased anxiety.  He did share that having a heart attack and dealing with the coronavirus made him think back to some of our previous conversations about coping skills for managing anxiety and depression and he is trying to use those skills more especially during this time of pandemic.    Worked hard today on relationship concerns with adult son and his girlfriend who will soon be living together. Son has been living with patient and will be eventually moving out to be with girlfriend.  Patient having difficulty overthinking and is making some assumptions that probably are not true about whether or not he will remain a part of their lives. Discussed ways patient can have an open discussion with son and with his girlfriend about his concerns to see if there's any validity to his assumptions and possible misinterpretations.       Also discussed his concerns about the coronavirus and is less fearful at this point. He reports he is following all the virus precautions, and honoring the social distancing recommended.  Patient does continue using the CBT strategies and diagram shared in a prior session and that has been helpful in managing his anxiety, depression, and today we  spoke about him using it also to help curb his overthinking.   Interventions: Cognitive Behavioral Therapy and Ego-Supportive  Diagnosis:   ICD-10-CM   1. Moderate episode of recurrent major depressive disorder ([place]) F33.1     Plan:   Encouraged good self-care and adhering to virus precautions, as well as following up with the CBT strategies for continued use in several areas noted above.  Next appt within 1-2 wks.  Shanon Ace,  LCSW

## 2019-03-10 ENCOUNTER — Other Ambulatory Visit: Payer: Self-pay

## 2019-03-10 ENCOUNTER — Ambulatory Visit (INDEPENDENT_AMBULATORY_CARE_PROVIDER_SITE_OTHER): Payer: Medicare Other | Admitting: Psychiatry

## 2019-03-10 DIAGNOSIS — F331 Major depressive disorder, recurrent, moderate: Secondary | ICD-10-CM | POA: Diagnosis not present

## 2019-03-10 NOTE — Progress Notes (Signed)
[facility] Counselor/Therapist Progress Note  Patient ID: Sergio Griffin, MRN: [id number],    Date: 03/10/2019  Time Spent:  60 minutes         11:00am to 12:00 noon  Treatment Type: Individual Therapy   Virtual Visit via Video:  I connected with patient by a video enabled telemedicine application or telephone, with their informed consent, and verified patient privacy and that I am speaking with the correct person using two identifiers. I am at [place] and patient is at his home.   I discussed the limitations, risks, security and privacy concerns of performing psychotherapy and management service by telephone and the availability of in person appointments. I also discussed with the patient that there may be a patient responsible charge related to this service. The patient expressed understanding and agreed to proceed.  I discussed the treatment planning with the patient. The patient was provided an opportunity to ask questions and all were answered. The patient agreed with the plan and demonstrated an understanding of the instructions.   The patient was advised to call  our office if  symptoms worsen or feel they are in a crisis state and need immediate contact.   Reported Symptoms: Patient continuing to recover from heart attack;  Also reports depressed mood, anxious, stressed.  Mental Status Exam:  Appearance:    casual  Behavior:  Sharing  Motor:  Normal  Speech/Language:   Normal Rate  Affect:  anxious  Mood:  Anxious, some depressed mood, stressed  Thought process:  Goal-directed  Thought content:    WNL  Sensory/Perceptual disturbances:    WNL  Orientation:  oriented to person, place, time/date, situation, day of week, month of year and year  Attention:  Good  Concentration:  Good  Memory:  WNL  Fund of knowledge:   Good  Insight:    Fair  Judgment:   Good  Impulse Control:  Good   Risk Assessment: Danger to Self:  No Self-injurious Behavior:  No Danger to Others: No Duty to Warn:no Physical Aggression / Violence:No  Access to Firearms a concern: No  Gang Involvement:No   Subjective:   Patient just found out that one of his longtime coworkers died 4 days. This is the 5th friend he has lost in past 3 1/2 months. Processed at length some of his feelings and what sounded like fears, about the multiple deaths which is even more difficult with the coronavirus restrictions and not having funerals as usual.  Talks about how when his son moves out "that will be another loss".  Seemed to be comparing that to the recent losses that were situations involving death.  Focused on the differences in the multiple losses without minimizing what patient is feeling.   Patient today reports symptoms of anxiety, stress, and depression.  He continues to heal from his recent heart attack and seems to be progressing. States he doesn't have his normal stamina back yet. Trying not to comfort-eat during this time of being isolated due to corona-virus. Talked about strategies to help curb this and contribute to his self-care for his other health issues.  Patient does continue using the CBT strategies and diagram shared in a prior session and that has been helpful in managing his anxiety, depression, and today we spoke about him using it also to help curb his overthinking and comfort eating.    Interventions: Cognitive Behavioral Therapy and Ego-Supportive  Diagnosis:   ICD-10-CM   1. Moderate episode of  recurrent major depressive disorder ([place]) F33.1     Plan:   Encouraged good self-care and adhering to virus precautions, as well as following up with the CBT strategies for continued use in target areas noted above.  Next appt within 2 wks.  Shanon Ace, LCSW

## 2019-03-12 ENCOUNTER — Ambulatory Visit (INDEPENDENT_AMBULATORY_CARE_PROVIDER_SITE_OTHER): Payer: Medicare Other | Admitting: Psychiatry

## 2019-03-12 ENCOUNTER — Other Ambulatory Visit: Payer: Self-pay

## 2019-03-12 DIAGNOSIS — F331 Major depressive disorder, recurrent, moderate: Secondary | ICD-10-CM | POA: Diagnosis not present

## 2019-03-12 DIAGNOSIS — F5101 Primary insomnia: Secondary | ICD-10-CM | POA: Diagnosis not present

## 2019-03-12 DIAGNOSIS — F410 Panic disorder [episodic paroxysmal anxiety] without agoraphobia: Secondary | ICD-10-CM | POA: Diagnosis not present

## 2019-03-12 MED ORDER — BUPROPION HCL ER (XL) 300 MG PO TB24: 300.0000 mg | ORAL_TABLET | ORAL | 1 refills | Status: DC

## 2019-03-12 MED ORDER — CLONAZEPAM 0.5 MG PO TABS: 0.5000 mg | ORAL_TABLET | Freq: Two times a day (BID) | ORAL | 0 refills | Status: DC | PRN

## 2019-03-12 NOTE — Progress Notes (Signed)
Sergio Griffin [id number] February 17, 1951 68 y.o.  Virtual Visit via Video Note  I connected with Sergio Griffin on 03/12/19 at  8:30 AM EDT by a video enabled telemedicine application and verified that I am speaking with the correct person using two identifiers.   I discussed the limitations of evaluation and management by telemedicine and the availability of in person appointments. The patient expressed understanding and agreed to proceed.   I discussed the assessment and treatment plan with the patient. The patient was provided an opportunity to ask questions and all were answered. The patient agreed with the plan and demonstrated an understanding of the instructions.   The patient was advised to call back or seek an in-person evaluation if the symptoms worsen or if the condition fails to improve as anticipated.  I provided 30 minutes of non-face-to-face time during this encounter.  Patient was located at home.  Provider was located at home.   Thayer Headings, PMHNP   Subjective:   Patient ID:  Sergio Griffin is a 68 y.o. (DOB 28-Oct-1951) male.  Chief Complaint:  Chief Complaint  Patient presents with  . Follow-up    History of depression and anxiety  . Insomnia    HPI Sergio Griffin presents to the office today for follow-up of depression and anxiety.  Reports that he had a corneal abrasion. He reports that on his birthday he had another cardiac event and was hospitalized. He reports that he is trying to recover physically from this. "There is a gulf between what you want to do and what you actually can do." He reports that his mood has "remained fairly even." He reports that he continues to have some stress in response to family members' health issues.   Reports that his son is going to be moving out in the upcoming weeks and that he has mixed emotions about this. "My support system is going to change." Reports that this brings up reminders of other losses he has had in the last  several years. He reports that he has had 5 co-workers die this year and 4 classmates.   He feels that medications have been helpful for his mood and anxiety s/s. Reports that he feels that Wellbutrin XL has been helpful for depression. He reports that Klonopin has helped his anxiety and that it was increased back to BID during hospitalization. He reports feeling the onset of panic at time and can use coping strategies to prevent s/s from escalating to full blown panic attacks.   He reports that he has gained some weight due to inactivity, changes in diet, and "comfort eating." He reports that his sleep has been fair. He reports sleeping a few hours and then awakening. Reports it will sometimes take a few hours to return to sleep. Reports occasionally taking naps. Energy has been low since cardiac issues. Motivation is fair. Reports that he is able to do some things he needs to do around the house and needing to take frequent breaks.   Denies SI.    Review of Systems:  Review of Systems  Constitutional: Positive for fatigue.  HENT: Positive for voice change.   Respiratory: Positive for cough and choking.   Cardiovascular:       Recent cardiac event and stents placed  Gastrointestinal:       Worsening GERD  Musculoskeletal: Negative for gait problem.  Allergic/Immunologic: Positive for environmental allergies.  Neurological: Negative for tremors.  Psychiatric/Behavioral:       Please  refer to HPI    Medications: I have reviewed the patient's current medications.  Current Outpatient Medications  Medication Sig Dispense Refill  . amLODipine (NORVASC) 10 MG tablet Take 10 mg by mouth daily.     Marland Kitchen aspirin EC 81 MG tablet Take 81 mg by mouth daily.     . clonazePAM (KLONOPIN) 0.5 MG tablet Take 1 tablet (0.5 mg total) by mouth 2 (two) times daily as needed for anxiety. 180 tablet 0  . furosemide (LASIX) 20 MG tablet Take 20 mg by mouth daily as needed.    . latanoprost (XALATAN) 0.005 %  ophthalmic solution Place 1 drop into both eyes at bedtime.    Marland Kitchen linaclotide (LINZESS) 72 MCG capsule Take 1 capsule (72 mcg total) by mouth daily before breakfast. 30 capsule 3  . losartan (COZAAR) 50 MG tablet Take 50 mg by mouth 2 (two) times daily.     . metoprolol tartrate (LOPRESSOR) 25 MG tablet Take 25 mg by mouth 2 (two) times daily.    . nitroGLYCERIN (NITROSTAT) 0.4 MG SL tablet Place 1 tablet (0.4 mg total) under the tongue every 5 (five) minutes x 3 doses as needed for chest pain. 30 tablet 1  . Omega-3 Fatty Acids (FISH OIL) 1000 MG CAPS Take 1,000 mg by mouth daily.    . ondansetron (ZOFRAN ODT) 4 MG disintegrating tablet Take 1 tablet (4 mg total) by mouth every 8 (eight) hours as needed for nausea or vomiting. 30 tablet 1  . prasugrel (EFFIENT) 10 MG TABS tablet     . traMADol (ULTRAM) 50 MG tablet Take 1 tablet (50 mg total) by mouth every 12 (twelve) hours as needed for moderate pain or severe pain (M79.3). 28 tablet 0  . buPROPion (WELLBUTRIN XL) 300 MG 24 hr tablet Take 1 tablet (300 mg total) by mouth every morning. 90 tablet 1  . famotidine (PEPCID) 40 MG tablet Take 1 tablet (40 mg total) by mouth daily. (Patient not taking: Reported on 03/12/2019) 30 tablet 3   No current facility-administered medications for this visit.     Medication Side Effects: None  Allergies:  Allergies  Allergen Reactions  . Other Itching    Darvocet-itching  . Sulfa Antibiotics Itching    Past Medical History:  Diagnosis Date  . Anxiety   . Barrett's esophagus   . CAD (coronary artery disease)    PCI, stent 2011  . Cardiac arrhythmia   . Chicken pox   . Depression   . Diverticulosis   . Gastric ulcer   . GERD (gastroesophageal reflux disease)   . Glaucoma   . History of myocardial infarction   . Hyperlipidemia   . Hypertension   . Kidney stones   . Kidney stones   . Pneumonia 11/2014  . Prostatitis   . Reflux   . Sleep apnea   . UTI (lower urinary tract infection)      Family History  Problem Relation Age of Onset  . Arthritis Mother   . Hyperlipidemia Mother   . Stroke Mother   . Hypertension Mother   . Ovarian cancer Mother   . Hyperlipidemia Father   . Heart disease Father   . Hypertension Father   . Non-Hodgkin's lymphoma Father   . Hyperlipidemia Maternal Grandmother   . Heart disease Maternal Grandmother   . Stroke Maternal Grandmother   . Hypertension Maternal Grandmother   . Diabetes Maternal Grandmother   . Hyperlipidemia Maternal Grandfather   . Heart disease Maternal Grandfather   .  Hypertension Maternal Grandfather   . Heart disease Paternal Grandmother   . Heart disease Paternal Grandfather   . Stroke Paternal Grandfather   . Hypertension Paternal Grandfather   . Colon cancer Neg Hx   . Esophageal cancer Neg Hx   . Pancreatic cancer Neg Hx   . Stomach cancer Neg Hx   . Liver disease Neg Hx     Social History   Socioeconomic History  . Marital status: Divorced    Spouse name: Not on file  . Number of children: 2  . Years of education: 62  . Highest education level: Not on file  Occupational History  . 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 Financial resource strain: Not on file  . Food insecurity:    Worry: Not on file    Inability: Not on file  . Transportation needs:    Medical: Not on file    Non-medical: Not on file  Tobacco Use  . Smoking status: Former Smoker    Types: Cigarettes  . Smokeless tobacco: Never Used  . Tobacco comment: quit 1987  Substance and Sexual Activity  . Alcohol use: No  . Drug use: No  . Sexual activity: Not Currently    Partners: Female  Lifestyle  . Physical activity:    Days per week: Not on file    Minutes per session: Not on file  . Stress: Not on file  Relationships  . Social connections:    Talks on phone: Not on file    Gets together: Not on file    Attends religious service: Not on file    Active member of club or organization:  Not on file    Attends meetings of clubs or organizations: Not on file    Relationship status: Not on file  . Intimate partner violence:    Fear of current or ex partner: Not on file    Emotionally abused: Not on file    Physically abused: Not on file    Forced sexual activity: Not on file  Other Topics Concern  . Not on file  Social History Narrative   Fun: Paint, cook, travel   Denies religious beliefs effecting health care.     Past Medical History, Surgical history, Social history, and Family history were reviewed and updated as appropriate.   Please see review of systems for further details on the patient's review from today.   Objective:   Physical Exam:  There were no vitals taken for this visit.  Physical Exam Neurological:     Mental Status: He is alert and oriented to person, place, and time.     Cranial Nerves: No dysarthria.  Psychiatric:        Attention and Perception: Attention normal.        Mood and Affect: Mood normal.        Speech: Speech normal.        Behavior: Behavior is cooperative.        Thought Content: Thought content normal. Thought content is not paranoid or delusional. Thought content does not include homicidal or suicidal ideation. Thought content does not include homicidal or suicidal plan.        Cognition and Memory: Cognition and memory normal.        Judgment: Judgment normal.     Lab Review:     Component Value Date/Time   NA 139 09/03/2018 0924   K 3.7 09/03/2018 0924   CL 109  09/03/2018 0924   CO2 24 09/03/2018 0924   GLUCOSE 112 (H) 09/03/2018 0924   BUN 20 09/03/2018 0924   CREATININE 1.33 09/03/2018 0924   CALCIUM 8.4 09/03/2018 0924   PROT 6.4 (L) 06/23/2015 0520   ALBUMIN 2.6 (L) 06/23/2015 0520   AST 22 06/23/2015 0520   ALT 18 06/23/2015 0520   ALKPHOS 33 (L) 06/23/2015 0520   BILITOT 0.4 06/23/2015 0520   GFRNONAA 56 (L) 06/29/2015 0615   GFRAA >60 06/29/2015 0615       Component Value Date/Time   WBC 12.0  (H) 09/15/2018 1421   RBC 5.04 09/15/2018 1421   HGB 14.6 09/15/2018 1421   HCT 44.2 09/15/2018 1421   PLT 290.0 09/15/2018 1421   MCV 87.5 09/15/2018 1421   MCH 27.5 06/23/2015 0520   MCHC 33.0 09/15/2018 1421   RDW 14.3 09/15/2018 1421   LYMPHSABS 2.7 09/15/2018 1421   MONOABS 0.7 09/15/2018 1421   EOSABS 0.8 (H) 09/15/2018 1421   BASOSABS 0.1 09/15/2018 1421    No results found for: POCLITH, LITHIUM   No results found for: PHENYTOIN, PHENOBARB, VALPROATE, CBMZ   .res Assessment: Plan:   Discussed treatment plan and patient reports that he would like to continue with current medications since mood, anxiety, and insomnia have been fairly well-controlled overall without tolerability issues. We will continue Wellbutrin XL 300 mg daily for depression. Continue Klonopin 0.5 mg twice daily as needed for anxiety. Recommend continuing psychotherapy with Rinaldo Cloud, LCSW. Patient advised to contact office with any questions, adverse effects, or acute worsening in signs and symptoms.  Moderate episode of recurrent major depressive disorder ([place]) - Plan: buPROPion (WELLBUTRIN XL) 300 MG 24 hr tablet  Panic disorder - Plan: clonazePAM (KLONOPIN) 0.5 MG tablet  Primary insomnia  Please see After Visit Summary for patient specific instructions.  Future Appointments  Date Time Provider [place]  03/24/2019  3:00 PM Shanon Ace, LCSW [facility] None  04/07/2019 10:00 AM Shanon Ace, LCSW [facility] None  04/21/2019 10:00 AM Shanon Ace, LCSW [facility] None  05/19/2019 10:00 AM Shanon Ace, LCSW [facility] None  06/11/2019  9:00 AM Thayer Headings, PMHNP [facility] None    No orders of the defined types were placed in this encounter.     -------------------------------

## 2019-03-14 ENCOUNTER — Encounter: Payer: Self-pay | Admitting: Psychiatry

## 2019-03-24 ENCOUNTER — Ambulatory Visit (INDEPENDENT_AMBULATORY_CARE_PROVIDER_SITE_OTHER): Payer: Medicare Other | Admitting: Psychiatry

## 2019-03-24 ENCOUNTER — Other Ambulatory Visit: Payer: Self-pay

## 2019-03-24 DIAGNOSIS — F331 Major depressive disorder, recurrent, moderate: Secondary | ICD-10-CM

## 2019-03-24 DIAGNOSIS — M25562 Pain in left knee: Secondary | ICD-10-CM | POA: Diagnosis not present

## 2019-03-24 DIAGNOSIS — S2239XA Fracture of one rib, unspecified side, initial encounter for closed fracture: Secondary | ICD-10-CM | POA: Diagnosis not present

## 2019-03-24 DIAGNOSIS — M25532 Pain in left wrist: Secondary | ICD-10-CM | POA: Diagnosis not present

## 2019-03-24 DIAGNOSIS — R0781 Pleurodynia: Secondary | ICD-10-CM | POA: Diagnosis not present

## 2019-03-24 NOTE — Progress Notes (Signed)
[facility] Counselor/Therapist Progress Note  Patient ID: Sergio Griffin, MRN: [id number],    Date: 03/24/2019  Time Spent:  60 minutes         3:00am to 4:00 noon  Treatment Type: Individual Therapy   Virtual Visit Note:  I connected with patient by a video enabled telemedicine application or telephone, with their informed consent, and verified patient privacy and that I am speaking with the correct person using two identifiers. I am at [place] and patient is at his home.   I discussed the limitations, risks, security and privacy concerns of performing psychotherapy and management service by telephone and the availability of in person appointments. I also discussed with the patient that there may be a patient responsible charge related to this service. The patient expressed understanding and agreed to proceed.  I discussed the treatment planning with the patient. The patient was provided an opportunity to ask questions and all were answered. The patient agreed with the plan and demonstrated an understanding of the instructions.   The patient was advised to call  our office if  symptoms worsen or feel they are in a crisis state and need immediate contact.   Reported Symptoms: Patient continuing to recover from heart attack; some depressed mood, anxious, stressed.  Mental Status Exam:  Appearance:    n/a   (telehealth)  Behavior:  Sharing  Motor:  Normal  Speech/Language:   Normal Rate  Affect:  N/A    (telehealth)  Mood:  Anxious, some depressed mood, stressed  Thought process:  Goal-directed  Thought content:    WNL  Sensory/Perceptual disturbances:    WNL  Orientation:  oriented to person, place, time/date, situation, day of week, month of year and year  Attention:  Good  Concentration:  Good  Memory:  WNL  Fund of knowledge:   Good  Insight:    Fair  Judgment:   Good  Impulse Control:  Good   Risk Assessment: Danger to Self:  No Self-injurious  Behavior: No Danger to Others: No Duty to Warn:no Physical Aggression / Violence:No  Access to Firearms a concern: No  Gang Involvement:No   Subjective:    Since last appt, patient was cleaning water off floor and while he was getting up, he lost balance and fell and hurt himself but not badly. Scared him and he just laid there for a few minutes but got up later.  Was x-rayed and had 2 upper ribs that were broken, so is healing from that.  Patient follows up on his grief of losing 5 friends to death within last 4 months.  Is more grounded at this point and feels like he is gradually getting better.  Says he contacted some of his friends and they shared their feelings with each other, and also used some suggested journaling which he did follow through on and states just writing thoughts down did help him.  Mentioned again that when his son moves out "that will be another loss" and that is happening later this week. Is feeling a bit threatened by son's moving out to go live with girlfriend, and is assuming and fearing that his relationship with son may be heavily affected. Has been very close to son for a long time. Patient shared that he and son spoke recently about some of patient's fears and that seemed to help.  Patient adds "I'm going to trust my faith rather than my fears."  Is making some good steps in  getting to know son's girlfriend better and she has responded appropriately.  Patient does continue using the CBT strategies and diagram shared in a prior session and that has been helpful in managing his anxiety, depression, and today we spoke about him using it also to help curb his overthinking.  Still bouncing back from his heart attack and is trying to increase his stamina. Understandably, his "recent fall set him back a bit."  Attitude good today with a sense of humor even though he's encountered some obstacles.  Interventions: Cognitive Behavioral Therapy and Ego-Supportive  Diagnosis:    ICD-10-CM   1. Moderate episode of recurrent major depressive disorder ([place]) F33.1     Plan:   Encouraged good self-care and adhering to virus precautions, as well as following up with the CBT strategies for continued use with issues noted above.  Next appt within 2 wks.  Shanon Ace, LCSW

## 2019-04-07 ENCOUNTER — Other Ambulatory Visit: Payer: Self-pay

## 2019-04-07 ENCOUNTER — Ambulatory Visit (INDEPENDENT_AMBULATORY_CARE_PROVIDER_SITE_OTHER): Payer: Medicare Other | Admitting: Psychiatry

## 2019-04-07 DIAGNOSIS — F331 Major depressive disorder, recurrent, moderate: Secondary | ICD-10-CM | POA: Diagnosis not present

## 2019-04-07 NOTE — Progress Notes (Signed)
[facility] Counselor/Therapist Progress Note  Patient ID: Sergio Griffin, MRN: [id number],    Date: 04/07/2019  Time Spent:  60 minutes         10:00am to 11:00am  Treatment Type: Individual Therapy   Virtual Visit Note:  I connected with patient by a video enabled telemedicine application or telephone, with their informed consent, and verified patient privacy and that I am speaking with the correct person using two identifiers. I am at [place] and patient is at his home.   I discussed the limitations, risks, security and privacy concerns of performing psychotherapy and management service by telephone and the availability of in person appointments. I also discussed with the patient that there may be a patient responsible charge related to this service. The patient expressed understanding and agreed to proceed.  I discussed the treatment planning with the patient. The patient was provided an opportunity to ask questions and all were answered. The patient agreed with the plan and demonstrated an understanding of the instructions.   The patient was advised to call  our office if  symptoms worsen or feel they are in a crisis state and need immediate contact.   Reported Symptoms: Stressed. Still having some pain in ribs from recent fall. Has been more depressed, anxious, angry  Mental Status Exam:  Appearance:    n/a   (telehealth)  Behavior:  Sharing  Motor:  Normal  Speech/Language:   Normal Rate  Affect:  N/A    (telehealth)  Mood:  Anxious, some depressed mood, stressed  Thought process:  Goal-directed  Thought content:    WNL  Sensory/Perceptual disturbances:    WNL  Orientation:  oriented to person, place, time/date, situation, day of week, month of year and year  Attention:  Good  Concentration:  Good  Memory:  WNL  Fund of knowledge:   Good  Insight:    Fair  Judgment:   Good  Impulse Control:  Good   Risk Assessment: Danger to Self:   No Self-injurious Behavior: No Danger to Others: No Duty to Warn:no Physical Aggression / Violence:No  Access to Firearms a concern: No  Gang Involvement:No   Subjective:   Patient reports the symptoms listed above.  "Rough week last week with more depression and anxiety, and some anger and impatience.  Also had some good moments as he got to see his new grandchild.    Recently his son did move out, as planned, "which is another loss" and patient is  feeling a bit threatened by son's moving out to go live with girlfriend, and is assuming and fearing that his relationship with son may be negatively impacted.  Has been very close to son for a long time. Patient shared that he and son spoke recently about some of patient's fears and that seemed to help temporarily.  Patient adds "I'm going to trust my faith rather than my fears."  Had made some good steps in getting to know son's girlfriend better and she had responded appropriately. However more recently there's been some times when things were more difficult and this has been challenging for patient.  We discussed this today and how his mind wanders and he self-doubts some things in relationship to people, sometimes centered around son's girlfriend. Needed to process multiple relationship issues and did seem more settled and calm towards end of session today. Trust issues also brought up and addressed some and to be followed up on next session as needed.  Encouraged patient to continue using the CBT strategies and diagram that has been helpful in managing his anxiety, depression, and today we spoke further about him using it also to help curb his overthinking. Due to relationship concerns recently, it has been difficult for patient to curb the overthinking.  Understandably, his "recent fall set him back a bit and is continuing to heal.    Interventions: Cognitive Behavioral Therapy and Ego-Supportive  Diagnosis:   ICD-10-CM   1. Moderate episode  of recurrent major depressive disorder ([place]) F33.1     Plan:   Encouraged good self-care and adhering to virus precautions, as well as following up with the CBT strategies for continued use with issues noted above.  Next appt within 2 wks.  Shanon Ace, LCSW

## 2019-04-21 ENCOUNTER — Ambulatory Visit (INDEPENDENT_AMBULATORY_CARE_PROVIDER_SITE_OTHER): Payer: Medicare Other | Admitting: Psychiatry

## 2019-04-21 ENCOUNTER — Other Ambulatory Visit: Payer: Self-pay

## 2019-04-21 DIAGNOSIS — F331 Major depressive disorder, recurrent, moderate: Secondary | ICD-10-CM | POA: Diagnosis not present

## 2019-04-21 NOTE — Progress Notes (Signed)
[facility] Counselor/Therapist Progress Note  Patient ID: Sergio Griffin, MRN: [id number],    Date: 04/21/2019  Time Spent:  60 minutes         10:00am to 11:00am  Treatment Type: Individual Therapy   Virtual Visit Note:  I connected with patient by a video enabled telemedicine application or telephone, with their informed consent, and verified patient privacy and that I am speaking with the correct person using two identifiers. I am at [place] and patient is at his home.   I discussed the limitations, risks, security and privacy concerns of performing psychotherapy and management service by telephone and the availability of in person appointments. I also discussed with the patient that there may be a patient responsible charge related to this service. The patient expressed understanding and agreed to proceed.  I discussed the treatment planning with the patient. The patient was provided an opportunity to ask questions and all were answered. The patient agreed with the plan and demonstrated an understanding of the instructions.   The patient was advised to call  our office if  symptoms worsen or feel they are in a crisis state and need immediate contact.   Reported Symptoms: depression, sadness, anxiety  Any new medications or physical concerns since last appt:  Patient says "none".  Mental Status Exam:  Appearance:    n/a   (telehealth)  Behavior:  Sharing  Motor:  N/A    (telehealth)  Speech/Language:   Normal Rate  Affect:  N/A    (telehealth)  Mood:  Anxious, some depressed mood, stressed  Thought process:  Goal-directed  Thought content:    WNL  Sensory/Perceptual disturbances:    WNL  Orientation:  oriented to person, place, time/date, situation, day of week, month of year and year  Attention:  Good  Concentration:  Good  Memory:  WNL  Fund of knowledge:   Good  Insight:    Fair  Judgment:   Good  Impulse Control:  Good   Risk  Assessment: Danger to Self:  No Self-injurious Behavior: No Danger to Others: No Duty to Warn:no Physical Aggression / Violence:No  Access to Firearms a concern: No  Gang Involvement:No   Subjective:   Patient reports anxiety, sadness, and some depression.  His anger has diminished a lot.  Still working through his son moving out and going to live with girlfriend.  Not having as much time with his son and patient is struggling to get through this transition and not interpret it as something against him.  Became quite tearful. Tends to see things that are different and take it all personally and ends up feeling rejected and "it's another loss for me."  Has had prior losses in recent years and a couple of them have been brought up in addition to "the loss of his adult son moving out."  Discussed strategies, including CBT, that can help him with his feelings of loss, and yet not personalize it in a negative way.  Has recovered more from his prior fall at this point.  Encouraged him in good self-care and continued precautions with the COVID-19.  States he feels better after venting today and admits "losses are just difficult for me."   Interventions: Cognitive Behavioral Therapy and Ego-Supportive  Diagnosis:   ICD-10-CM   1. Moderate episode of recurrent major depressive disorder ([place]) F33.1     Plan:   Encouraged good self-care and adhering to virus precautions, as well as following up  with the CBT strategies for continued use with issues noted above.  Next appt within 2 wks.  Shanon Ace, LCSW

## 2019-05-05 ENCOUNTER — Ambulatory Visit: Payer: 59 | Admitting: Psychiatry

## 2019-05-05 DIAGNOSIS — I2510 Atherosclerotic heart disease of native coronary artery without angina pectoris: Secondary | ICD-10-CM | POA: Diagnosis not present

## 2019-05-05 DIAGNOSIS — I10 Essential (primary) hypertension: Secondary | ICD-10-CM | POA: Diagnosis not present

## 2019-05-05 DIAGNOSIS — I209 Angina pectoris, unspecified: Secondary | ICD-10-CM | POA: Diagnosis not present

## 2019-05-05 DIAGNOSIS — R5383 Other fatigue: Secondary | ICD-10-CM | POA: Diagnosis not present

## 2019-05-05 DIAGNOSIS — R600 Localized edema: Secondary | ICD-10-CM | POA: Diagnosis not present

## 2019-05-07 ENCOUNTER — Ambulatory Visit (INDEPENDENT_AMBULATORY_CARE_PROVIDER_SITE_OTHER): Payer: Medicare Other | Admitting: Psychiatry

## 2019-05-07 ENCOUNTER — Other Ambulatory Visit: Payer: Self-pay

## 2019-05-07 DIAGNOSIS — F331 Major depressive disorder, recurrent, moderate: Secondary | ICD-10-CM

## 2019-05-07 NOTE — Progress Notes (Signed)
[facility] Counselor/Therapist Progress Note  Patient ID: Sergio Griffin, MRN: [id number],    Date: 05/07/2019  Time Spent:  60 minutes         1:00pm to 2:00pm  Treatment Type: Individual Therapy   Virtual Visit Note:  I connected with patient by a video enabled telemedicine application or telephone, with their informed consent, and verified patient privacy and that I am speaking with the correct person using two identifiers. I am at [place] and patient is at his home.   I discussed the limitations, risks, security and privacy concerns of performing psychotherapy and management service by telephone and the availability of in person appointments. I also discussed with the patient that there may be a patient responsible charge related to this service. The patient expressed understanding and agreed to proceed.  I discussed the treatment planning with the patient. The patient was provided an opportunity to ask questions and all were answered. The patient agreed with the plan and demonstrated an understanding of the instructions.   The patient was advised to call  our office if  symptoms worsen or feel they are in a crisis state and need immediate contact.   Reported Symptoms: depression, sadness, anxiety, frustration, disappointed, anger  Any new medications or physical concerns since last appt:  Patient says no new meds but some changes in dosage with my BP meds due to BP being elevated.  Have been in touch with my cardiologist twice recently.  Reports "normal" readings now.    Mental Status Exam:  Appearance:    n/a   (telehealth)  Behavior:  Sharing  Motor:  N/A    (telehealth)  Speech/Language:   Normal Rate  Affect:  N/A    (telehealth)  Mood:  Anxious, some depressed mood  Thought process:  Goal-directed  Thought content:    WNL  Sensory/Perceptual disturbances:    WNL  Orientation:  oriented to person, place, time/date, situation, day of week, month of  year and year  Attention:  Good  Concentration:  Good  Memory:  WNL  Fund of knowledge:   Good  Insight:    Fair  Judgment:   Good  Impulse Control:  Good   Risk Assessment: Danger to Self:  No Self-injurious Behavior: No Danger to Others: No Duty to Warn:no Physical Aggression / Violence:No  Access to Firearms a concern: No  Gang Involvement:No   Subjective:   Patient reports anxiety, sadness, depression, anger, disappointed, and frustration. Has been having some issues with not feeling heard from some of his medical providers.  Discussed this in detail during session today and we eventually talked through how he could speak with his medical providers about his concerns and gain a clearer understanding as to whether he's being heard or not.  If not, it will open the door for him to clearly state again what he wants them to hear from him.  Talked about ways of communicating to be heard more clearly.  Still working through his  son moving out and going to live with girlfriend.  Not having as much time with his son and patient is struggling to get through this transition and not interpret it as something against him. Patient is beginning to question if some of what he is projecting onto adult son, might really be patient's issues and some possible misinterpretation.  Followed up on this with patient and supported his willingness to look more at his feelings and thoughts, relating it to  CBT.  Discussed strategies, including CBT, that can help him with his feelings of loss, and yet not personalize it in a negative way.   Encouraged him in good self-care and continued precautions with the COVID-19.  States he feels better after venting today and plans to follow up on suggested strategies.   Interventions: Cognitive Behavioral Therapy and Ego-Supportive  Diagnosis:   ICD-10-CM   1. Moderate episode of recurrent major depressive disorder ([place])  F33.1     Plan:   Encouraged good self-care and  adhering to virus precautions, as well as following up with the CBT strategies for continued use with issues noted above.  Next appt within 2 wks.  Shanon Ace, LCSW

## 2019-05-19 ENCOUNTER — Ambulatory Visit (INDEPENDENT_AMBULATORY_CARE_PROVIDER_SITE_OTHER): Payer: Medicare Other | Admitting: Psychiatry

## 2019-05-19 ENCOUNTER — Other Ambulatory Visit: Payer: Self-pay

## 2019-05-19 DIAGNOSIS — F331 Major depressive disorder, recurrent, moderate: Secondary | ICD-10-CM

## 2019-05-19 NOTE — Progress Notes (Signed)
[facility] Counselor/Therapist Progress Note  Patient ID: Sergio Griffin, MRN: [id number],    Date: 05/19/2019  Time Spent:  60 minutes         10:00am to 11:00am  Treatment Type: Individual Therapy   Virtual Visit Note:  I connected with patient by a video enabled telemedicine application or telephone, with their informed consent, and verified patient privacy and that I am speaking with the correct person using two identifiers. I am at [place] and patient is at his home.   I discussed the limitations, risks, security and privacy concerns of performing psychotherapy and management service by telephone and the availability of in person appointments. I also discussed with the patient that there may be a patient responsible charge related to this service. The patient expressed understanding and agreed to proceed.  I discussed the treatment planning with the patient. The patient was provided an opportunity to ask questions and all were answered. The patient agreed with the plan and demonstrated an understanding of the instructions.   The patient was advised to call  our office if  symptoms worsen or feel they are in a crisis state and need immediate contact.   Reported Symptoms: depression, sadness, anxiety, frustration, sadness with loss of several friends  Any new medications or physical concerns since last appt:  Patient says no new meds and no new health concerns.  Mental Status Exam:  Appearance:    n/a   (telehealth)  Behavior:  Sharing  Motor:  Normal  Speech/Language:   Normal Rate  Affect:  N/A    (telehealth)  Mood:  Anxious, some depressed mood  Thought process:  Goal-directed  Thought content:    WNL  Sensory/Perceptual disturbances:    WNL  Orientation:  oriented to person, place, time/date, situation, day of week, month of year and year  Attention:  Good  Concentration:  Good  Memory:  WNL  Fund of knowledge:   Good  Insight:    Fair   Judgment:   Good  Impulse Control:  Good   Risk Assessment: Danger to Self:  No Self-injurious Behavior: No Danger to Others: No Duty to Warn:no Physical Aggression / Violence:No  Access to Firearms a concern: No  Gang Involvement:No   Subjective:  Patient reporting today the symptoms noted above. Is frustrated with some of his healthcare issues and circumstances which he was able to vent his concerns about and seemed to eventually feel heard and had some ideas about how to communicate better with providers.  Is continuing to be checked for some fluctuation in BP and is feeling tired quite a bit which is frustrating for him. Discussed some strategies for both communication and also better managing frustration "without just giving in" which was his earlier interpretation of what he should do.  Has also experienced the death this past week of another longtime friend.  Patient especially sad on this issue and shared his thoughts and feelings of losing several friends to death recently. Still working through his sadness of adult son moving out and going to live with girlfriend.  Not having as much time with his son and patient is struggling to get through this transition and not interpret it as something against him. Patient is open toi the thought that he may sometimes be over-personalizing and misinterpreting his son's lessened contact although son is staying in touch with him and offers for him to drop by his new home.  Followed up on this with  patient and supported his willingness to look more at his feelings and thoughts, using some re-framing strategies.  Also discussed strategies that can help him manage his grief/sadness regarding multiple losses recently, as well as letting people know when he needs support.   Encouraged him in good self-care and continued precautions with the COVID-19.  States he feels better after venting today and plans to follow up on suggested  strategies.   Interventions: Cognitive Behavioral Therapy and Ego-Supportive  Diagnosis:   ICD-10-CM   1. Moderate episode of recurrent major depressive disorder ([place])  F33.1     Plan:   Encouraged good self-care and adhering to virus precautions, as well as following up with strategies for continued use with issues noted above.  Next appt within 2 wks.  Shanon Ace, LCSW

## 2019-06-11 ENCOUNTER — Ambulatory Visit (INDEPENDENT_AMBULATORY_CARE_PROVIDER_SITE_OTHER): Payer: Medicare Other | Admitting: Psychiatry

## 2019-06-11 ENCOUNTER — Other Ambulatory Visit: Payer: Self-pay

## 2019-06-11 ENCOUNTER — Ambulatory Visit: Payer: 59 | Admitting: Psychiatry

## 2019-06-11 DIAGNOSIS — F331 Major depressive disorder, recurrent, moderate: Secondary | ICD-10-CM | POA: Diagnosis not present

## 2019-06-11 NOTE — Progress Notes (Signed)
[facility] Counselor/Therapist Progress Note  Patient ID: Sergio Griffin, MRN: [id number],    Date: 06/11/2019  Time Spent:  60 minutes      3:00pm to 4:00pm  Treatment Type: Individual Therapy   Virtual Visit Note:  I connected with patient by a video enabled telemedicine application or telephone, with their informed consent, and verified patient privacy and that I am speaking with the correct person using two identifiers. I am at [place] and patient is at his home.   I discussed the limitations, risks, security and privacy concerns of performing psychotherapy and management service by telephone and the availability of in person appointments. I also discussed with the patient that there may be a patient responsible charge related to this service. The patient expressed understanding and agreed to proceed.  I discussed the treatment planning with the patient. The patient was provided an opportunity to ask questions and all were answered. The patient agreed with the plan and demonstrated an understanding of the instructions.   The patient was advised to call  our office if  symptoms worsen or feel they are in a crisis state and need immediate contact.   Reported Symptoms:  depression, sadness, anxiety, frustration, sadness with loss of several friends  Any new medications or physical concerns since last appt:  Patient says no new meds and no new health concerns.  Has had some blood pressure irregularity recently and is in touch with his PCP about it and will see doctor next week.  Patient states he is also having some balance issues ("not bad")  lately and is going to speak with his doctor about this as well at his appt.  Mental Status Exam:  Appearance:    n/a   (telehealth)  Behavior:  Sharing  Motor:  Normal  Speech/Language:   Normal Rate  Affect:  N/A    (telehealth)  Mood:  Anxious, some depressed mood  Thought process:  Goal-directed  Thought content:     WNL  Sensory/Perceptual disturbances:    WNL  Orientation:  oriented to person, place, time/date, situation, day of week, month of year and year  Attention:  Good  Concentration:  Good  Memory:  WNL  Fund of knowledge:   Good  Insight:    Fair  Judgment:   Good  Impulse Control:  Good   Risk Assessment: Danger to Self:  No Self-injurious Behavior: No Danger to Others: No Duty to Warn:no Physical Aggression / Violence:No  Access to Firearms a concern: No  Gang Involvement:No   Subjective:  Patient reporting today the symptoms noted above.  Is troubled because 2 more of his friends have died since his last appt here.  This makes several friends that have died wiithin past 2-3 months so his grief has been cumulative.  Does a good job of talking through his feelings of grief and sadness.  States he really doesn't have other people to talk to much about this.  Is still having some sadness over his adult son moving out to go live on his own with GF. This continues to be a big issue for patient and he is working to not overthink and over-personalize things in regards to his son and their relationship.  Discussed this more today and reviewed strategies that can help him cope in this transition time as well as with the multiple losses of friends. Supported his willingness to look more at his feelings and thoughts, using some re-framing strategies which  is difficult for patient and he is putting forth good effort.   Interventions: CBT, Grief therapy, Solution-focused  Diagnosis:   ICD-10-CM   1. Moderate episode of recurrent major depressive disorder ([place])  F33.1     Plan:   Encouraged good self-care and adhering to virus precautions, as well as following up with strategies for continued use with issues noted above.  Next appt within 2 wks.  Shanon Ace, LCSW

## 2019-06-25 ENCOUNTER — Ambulatory Visit: Payer: Medicare Other | Admitting: Psychiatry

## 2019-07-02 ENCOUNTER — Other Ambulatory Visit: Payer: Self-pay

## 2019-07-02 ENCOUNTER — Encounter: Payer: Self-pay | Admitting: Psychiatry

## 2019-07-02 ENCOUNTER — Ambulatory Visit (INDEPENDENT_AMBULATORY_CARE_PROVIDER_SITE_OTHER): Payer: Medicare Other | Admitting: Psychiatry

## 2019-07-02 DIAGNOSIS — F410 Panic disorder [episodic paroxysmal anxiety] without agoraphobia: Secondary | ICD-10-CM

## 2019-07-02 DIAGNOSIS — F331 Major depressive disorder, recurrent, moderate: Secondary | ICD-10-CM | POA: Diagnosis not present

## 2019-07-02 MED ORDER — BUPROPION HCL ER (XL) 300 MG PO TB24: 300.0000 mg | ORAL_TABLET | ORAL | 1 refills | Status: DC

## 2019-07-02 MED ORDER — CLONAZEPAM 0.5 MG PO TABS: 0.5000 mg | ORAL_TABLET | Freq: Two times a day (BID) | ORAL | 0 refills | Status: DC | PRN

## 2019-07-02 NOTE — Progress Notes (Signed)
Sergio Griffin [id number] [birth year] 68 y.o.  Virtual Visit via Telephone Note  I connected with pt on 07/02/19 at 10:30 AM EDT by telephone and verified that I am speaking with the correct person using two identifiers.   I discussed the limitations, risks, security and privacy concerns of performing an evaluation and management service by telephone and the availability of in person appointments. I also discussed with the patient that there may be a patient responsible charge related to this service. The patient expressed understanding and agreed to proceed.   I discussed the assessment and treatment plan with the patient. The patient was provided an opportunity to ask questions and all were answered. The patient agreed with the plan and demonstrated an understanding of the instructions.   The patient was advised to call back or seek an in-person evaluation if the symptoms worsen or if the condition fails to improve as anticipated.  I provided 30 minutes of non-face-to-face time during this encounter.  The patient was located at home.  The provider was located at [place].   Thayer Headings, PMHNP   Subjective:   Patient ID:  Sergio Griffin is a 68 y.o. (DOB 1951/03/21) male.  Chief Complaint:  Chief Complaint  Patient presents with  . Follow-up    h/o Depression and anxiety.     HPI Sergio Griffin presents for follow-up of depression and anxiety. He reports "the situation is stable overall with an exception here and there." He reports that he continues to try to recover from fall several months ago. He reports that he is easily fatigued, especially with increased heat and humidity. He reports that his motivation has "gone back and forth." He reports that talking about political issues has a negative effect on his mood and anxiety and has therefore been trying to limit exposure to current events and is setting limits with people that initiate these conversation. Reports  that he had some sadness and anxiety with son moving out. He reports that mood was more depressed right after son moved out and has improved some after 3-4 weeks. Denies any recent panic s/s. Reports periods of increased anxiety when he becomes choked. Appetite has been decreased over the last few weeks. Reports that he has been cooking less since son moved out. He reports adequate sleep. Reports sleeping excessively at times. He reports adequate concentration. Reports adequate concentration.   Son has moved out and now living in a house with is girlfriend. Reports that son did not give him a definitive move out and suddenly moved out. Reports limited contact with son since son moved out. Reports that he has had limited interaction with others during the pandemic and reports that he will go multiple days without seeing anyone. Reports knowing 13 people that have died in the last several months. Denies SI.  Past Psychiatric Medication Trials: Prozac- Adverse effects Wellbutrin- helpful for depression Celexa- Affective dulling Lexapro Imipramine- Helpful for anxiety, worsened glaucoma Klonopin- effective Ambien-ineffective.    Review of Systems:  Review of Systems  Respiratory: Positive for cough and choking.   Musculoskeletal: Positive for arthralgias and gait problem.  Neurological: Negative for tremors.  Psychiatric/Behavioral:       Please refer to HPI    Medications: I have reviewed the patient's current medications.  Current Outpatient Medications  Medication Sig Dispense Refill  . amLODipine (NORVASC) 10 MG tablet Take 10 mg by mouth daily.     Marland Kitchen aspirin EC 81 MG tablet Take 81 mg  by mouth daily.     . clonazePAM (KLONOPIN) 0.5 MG tablet Take 1 tablet (0.5 mg total) by mouth 2 (two) times daily as needed for anxiety. 180 tablet 0  . furosemide (LASIX) 20 MG tablet Take 20 mg by mouth daily as needed.    . latanoprost (XALATAN) 0.005 % ophthalmic solution Place 1 drop into both  eyes at bedtime.    Marland Kitchen losartan (COZAAR) 50 MG tablet Take 50 mg by mouth 2 (two) times daily.     . metoprolol tartrate (LOPRESSOR) 25 MG tablet Take 25 mg by mouth 2 (two) times daily.    . Omega-3 Fatty Acids (FISH OIL) 1000 MG CAPS Take 1,000 mg by mouth daily.    . ondansetron (ZOFRAN ODT) 4 MG disintegrating tablet Take 1 tablet (4 mg total) by mouth every 8 (eight) hours as needed for nausea or vomiting. 30 tablet 1  . prasugrel (EFFIENT) 10 MG TABS tablet Take 10 mg by mouth daily.     . rosuvastatin (CRESTOR) 40 MG tablet Take 40 mg by mouth daily.    Marland Kitchen buPROPion (WELLBUTRIN XL) 300 MG 24 hr tablet Take 1 tablet (300 mg total) by mouth every morning. 90 tablet 1  . famotidine (PEPCID) 40 MG tablet Take 1 tablet (40 mg total) by mouth daily. (Patient not taking: Reported on 03/12/2019) 30 tablet 3  . linaclotide (LINZESS) 72 MCG capsule Take 1 capsule (72 mcg total) by mouth daily before breakfast. (Patient taking differently: Take 72 mcg by mouth daily before breakfast. Reports taking as needed) 30 capsule 3  . nitroGLYCERIN (NITROSTAT) 0.4 MG SL tablet Place 1 tablet (0.4 mg total) under the tongue every 5 (five) minutes x 3 doses as needed for chest pain. 30 tablet 1  . traMADol (ULTRAM) 50 MG tablet Take 1 tablet (50 mg total) by mouth every 12 (twelve) hours as needed for moderate pain or severe pain (M79.3). (Patient not taking: Reported on 07/02/2019) 28 tablet 0   No current facility-administered medications for this visit.     Medication Side Effects: None  Allergies:  Allergies  Allergen Reactions  . Other Itching    Darvocet-itching  . Sulfa Antibiotics Itching    Past Medical History:  Diagnosis Date  . Anxiety   . Barrett's esophagus   . CAD (coronary artery disease)    PCI, stent 2011  . Cardiac arrhythmia   . Chicken pox   . Depression   . Diverticulosis   . Gastric ulcer   . GERD (gastroesophageal reflux disease)   . Glaucoma   . History of myocardial  infarction   . Hyperlipidemia   . Hypertension   . Kidney stones   . Kidney stones   . Pneumonia 11/2014  . Prostatitis   . Reflux   . Sleep apnea   . UTI (lower urinary tract infection)     Family History  Problem Relation Age of Onset  . Arthritis Mother   . Hyperlipidemia Mother   . Stroke Mother   . Hypertension Mother   . Ovarian cancer Mother   . Hyperlipidemia Father   . Heart disease Father   . Hypertension Father   . Non-Hodgkin's lymphoma Father   . Hyperlipidemia Maternal Grandmother   . Heart disease Maternal Grandmother   . Stroke Maternal Grandmother   . Hypertension Maternal Grandmother   . Diabetes Maternal Grandmother   . Hyperlipidemia Maternal Grandfather   . Heart disease Maternal Grandfather   . Hypertension Maternal Grandfather   .  Heart disease Paternal Grandmother   . Heart disease Paternal Grandfather   . Stroke Paternal Grandfather   . Hypertension Paternal Grandfather   . Colon cancer Neg Hx   . Esophageal cancer Neg Hx   . Pancreatic cancer Neg Hx   . Stomach cancer Neg Hx   . Liver disease Neg Hx     Social History   Socioeconomic History  . Marital status: Divorced    Spouse name: Not on file  . Number of children: 2  . Years of education: 33  . Highest education level: Not on file  Occupational History  . 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 Financial resource strain: Not on file  . Food insecurity    Worry: Not on file    Inability: Not on file  . Transportation needs    Medical: Not on file    Non-medical: Not on file  Tobacco Use  . Smoking status: Former Smoker    Types: Cigarettes  . Smokeless tobacco: Never Used  . Tobacco comment: quit 1987  Substance and Sexual Activity  . Alcohol use: No  . Drug use: No  . Sexual activity: Not Currently    Partners: Female  Lifestyle  . Physical activity    Days per week: Not on file    Minutes per session: Not on file  . Stress: Not on  file  Relationships  . Social Herbalist on phone: Not on file    Gets together: Not on file    Attends religious service: Not on file    Active member of club or organization: Not on file    Attends meetings of clubs or organizations: Not on file    Relationship status: Not on file  . Intimate partner violence    Fear of current or ex partner: Not on file    Emotionally abused: Not on file    Physically abused: Not on file    Forced sexual activity: Not on file  Other Topics Concern  . Not on file  Social History Narrative   Fun: Paint, cook, travel   Denies religious beliefs effecting health care.     Past Medical History, Surgical history, Social history, and Family history were reviewed and updated as appropriate.   Please see review of systems for further details on the patient's review from today.   Objective:   Physical Exam:  There were no vitals taken for this visit.  Physical Exam Neurological:     Mental Status: He is alert and oriented to person, place, and time.     Cranial Nerves: No dysarthria.  Psychiatric:        Attention and Perception: Attention normal.        Speech: Speech normal.        Behavior: Behavior is cooperative.        Thought Content: Thought content normal. Thought content is not paranoid or delusional. Thought content does not include homicidal or suicidal ideation. Thought content does not include homicidal or suicidal plan.        Cognition and Memory: Cognition and memory normal.        Judgment: Judgment normal.     Comments: Mood is appropriate to content.     Lab Review:     Component Value Date/Time   NA 139 09/03/2018 0924   K 3.7 09/03/2018 0924   CL 109 09/03/2018 0924   CO2 24 09/03/2018 1025  GLUCOSE 112 (H) 09/03/2018 0924   BUN 20 09/03/2018 0924   CREATININE 1.33 09/03/2018 0924   CALCIUM 8.4 09/03/2018 0924   PROT 6.4 (L) 06/23/2015 0520   ALBUMIN 2.6 (L) 06/23/2015 0520   AST 22 06/23/2015 0520    ALT 18 06/23/2015 0520   ALKPHOS 33 (L) 06/23/2015 0520   BILITOT 0.4 06/23/2015 0520   GFRNONAA 56 (L) 06/29/2015 0615   GFRAA >60 06/29/2015 0615       Component Value Date/Time   WBC 12.0 (H) 09/15/2018 1421   RBC 5.04 09/15/2018 1421   HGB 14.6 09/15/2018 1421   HCT 44.2 09/15/2018 1421   PLT 290.0 09/15/2018 1421   MCV 87.5 09/15/2018 1421   MCH 27.5 06/23/2015 0520   MCHC 33.0 09/15/2018 1421   RDW 14.3 09/15/2018 1421   LYMPHSABS 2.7 09/15/2018 1421   MONOABS 0.7 09/15/2018 1421   EOSABS 0.8 (H) 09/15/2018 1421   BASOSABS 0.1 09/15/2018 1421    No results found for: POCLITH, LITHIUM   No results found for: PHENYTOIN, PHENOBARB, VALPROATE, CBMZ   .res Assessment: Plan:   Pt reports that he would like to continue current medications since he feels mood and anxiety s/s are adequately controlled. Recommend continuing therapy with Rinaldo Cloud, LCSW. Pt to f/u with this provider in 3 months or sooner if clinically indicated.  [facility] was seen today for follow-up.  Diagnoses and all orders for this visit:  Moderate episode of recurrent major depressive disorder (HCC) -     buPROPion (WELLBUTRIN XL) 300 MG 24 hr tablet; Take 1 tablet (300 mg total) by mouth every morning.  Panic disorder -     clonazePAM (KLONOPIN) 0.5 MG tablet; Take 1 tablet (0.5 mg total) by mouth 2 (two) times daily as needed for anxiety.    Please see After Visit Summary for patient specific instructions.  Future Appointments  Date Time Provider [place]  07/09/2019 11:00 AM Shanon Ace, LCSW [facility] None    No orders of the defined types were placed in this encounter.     -------------------------------

## 2019-07-09 ENCOUNTER — Ambulatory Visit (INDEPENDENT_AMBULATORY_CARE_PROVIDER_SITE_OTHER): Payer: Medicare Other | Admitting: Psychiatry

## 2019-07-09 ENCOUNTER — Other Ambulatory Visit: Payer: Self-pay

## 2019-07-09 DIAGNOSIS — F331 Major depressive disorder, recurrent, moderate: Secondary | ICD-10-CM | POA: Diagnosis not present

## 2019-07-09 NOTE — Progress Notes (Signed)
[facility] Counselor/Therapist Progress Note  Patient ID: Sergio Griffin, MRN: [id number],    Date: 07/09/2019  Time Spent:  60 minutes      11:00am to 12:00noon  Treatment Type: Individual Therapy   Virtual Visit Note:  I connected with patient by a video enabled telemedicine application or telephone, with their informed consent, and verified patient privacy and that I am speaking with the correct person using two identifiers. I am at [place] and patient is at his home.   I discussed the limitations, risks, security and privacy concerns of performing psychotherapy and management service by telephone and the availability of in person appointments. I also discussed with the patient that there may be a patient responsible charge related to this service. The patient expressed understanding and agreed to proceed.  I discussed the treatment planning with the patient. The patient was provided an opportunity to ask questions and all were answered. The patient agreed with the plan and demonstrated an understanding of the instructions.   The patient was advised to call  our office if  symptoms worsen or feel they are in a crisis state and need immediate contact.   Reported Symptoms:  depression, anxiety, sadness with loss of several friends in recent weeks/months  Any new medications or physical concerns since last appt:  Patient says Patient reports no new meds and no new health concerns. " Dr is still working with some blood pressure issues."  Mental Status Exam:  Appearance:    n/a   (telehealth)  Behavior:  Sharing  Motor:  Normal  Speech/Language:   Normal Rate  Affect:  N/A    (telehealth)  Mood:  Anxious, some depressed mood  Thought process:  Goal-directed  Thought content:    WNL  Sensory/Perceptual disturbances:    WNL  Orientation:  oriented to person, place, time/date, situation, day of week, month of year and year  Attention:  Good  Concentration:   Good  Memory:  WNL  Fund of knowledge:   Good  Insight:    Fair  Judgment:   Good  Impulse Control:  Good   Risk Assessment: Danger to Self:  No Self-injurious Behavior: No Danger to Others: No Duty to Warn:no Physical Aggression / Violence:No  Access to Firearms a concern: No  Gang Involvement:No   Subjective:   Patient reporting today symptoms of anxiety, depression, and some sadness (which he says is mostly due to death of multiple friends in recents weeks/months).  Is working with his doctor on some continued BP issues.  Reports some decreased energy which he says he has reported to his Dr also.  Patient has had some appt conflicts recently so it's been a month since we were able to have an appt.  He feels that he has struggled some more during this time and had more things that "bottled up inside me as I don't vent my feelings very freely."   Supported him in his openly discussing multiple stressors and concerns.  Has had a couple good talks with adult son recently which helped some, as he has had a difficult time with son's moving out even though it had nothing to do with patient and son has definitely wanted to keep a good relationship with patient.  Has felt more secluded with the "coronavirus continuing on and on".  Reviewed some coping strategies with patient to help manage the aloneness and isolation at times, and ways of staying connected with people important to him.  Also the death of another friend this past week has added to his sadness and some of the aloneness.  Encouraged patient to stay in touch especially with his adult son and daughter, as well as other friends.  Supported his willingness to look more at his feelings and thoughts, using some re-framing strategies which is difficult for patient and he is putting forth good effort, as he has recognized that he sometimes interprets things in the wrong way and needs to re-frame some of our thoughts and be willing to sometimes  look at things in a different way. Seems less anxious and more grounded by session end.   Interventions: CBT, Grief therapy, Solution-focused  Diagnosis:   ICD-10-CM   1. Moderate episode of recurrent major depressive disorder ([place])  F33.1     Plan:   Encouraged good self-care (as noted above) and adhering to virus precautions, as well as following up with strategies for continued use with issues noted above. Goal review and progress noted in Flowsheets in chart.  Next appt within 2 wks.  Shanon Ace, LCSW

## 2019-07-30 ENCOUNTER — Ambulatory Visit (INDEPENDENT_AMBULATORY_CARE_PROVIDER_SITE_OTHER): Payer: Medicare Other | Admitting: Psychiatry

## 2019-07-30 ENCOUNTER — Other Ambulatory Visit: Payer: Self-pay

## 2019-07-30 DIAGNOSIS — F331 Major depressive disorder, recurrent, moderate: Secondary | ICD-10-CM | POA: Diagnosis not present

## 2019-07-30 NOTE — Progress Notes (Addendum)
[facility] Counselor/Therapist Progress Note  Patient ID: LEXX ETZEL, MRN: [id number],    Date: 07/30/2019  Time Spent: 60 minutes    12:00noon to 1:00pm   Virtual Visit via Video Note Connected with patient by a video enabled telemedicine/telehealth application, with their informed consent, and verified patient privacy and that I am speaking with the correct person using two identifiers. I discussed the limitations, risks, security and privacy concerns of performing psychotherapy and management service by telephone and the availability of in person appointments. I also discussed with the patient that there may be a patient responsible charge related to this service. The patient expressed understanding and agreed to proceed. I discussed the treatment planning with the patient. The patient was provided an opportunity to ask questions and all were answered. The patient agreed with the plan and demonstrated an understanding of the instructions. The patient was advised to call  our office if  symptoms worsen or feel they are in a crisis state and need immediate contact.   Therapist Location: [facility] Patient Location: home    Treatment Type: Individual Therapy  Reported Symptoms: anxiety, depression, fears, anger  Mental Status Exam:  Appearance:    n/a  telehealth    Behavior:  Sharing  Motor:  n/a   telehealth  Speech/Language:   Clear and Coherent  Affect:   n/a   telehealth  Mood:  anxious and depressed  Thought process:  normal  Thought content:    WNL  Sensory/Perceptual disturbances:    WNL  Orientation:  oriented to person, place, time/date, situation, day of week, month of year and year  Attention:  Good  Concentration:  Good  Memory:  Good  Fund of knowledge:   Good  Insight:    Good  Judgment:   Good  Impulse Control:  Good   Risk Assessment: Danger to Self:  No Self-injurious Behavior: No Danger to Others: No Duty to Warn:no Physical  Aggression / Violence:No  Access to Firearms a concern: No  Gang Involvement:No   Subjective:  Patient reports today having anxiety, depression, stress, anger, and fears. Shared issues between his mother and brother that is stirring up anger for him, and talked through this some today. Still struggling some and missing seeing his son as often as he did when they lived together. Anxiety and depression are "not any worse but hard to get better during this coronavirus situation and with all that's going on with violence in the world." Talked more about what has helped him some in the past including being in touch also with his daughter and son, setting healthier limits with his brother and his mother, and making his own needs a priority also. Reviewed thought patterns and how the more positive thoughts can help him manage stressors more effectively.    Interventions: Cognitive Behavioral Therapy  Diagnosis:   ICD-10-CM   1. Moderate episode of recurrent major depressive disorder ([place])  F33.1     Plan:  Patient not signing tx goal updates on computer screen due to [place].  Treatment Goals:  Long term goal: Develop healthy cognitive patterns and beliefs about himself and the world that lead to alleviation of depression and anxiety.  Progressing--Patient working to be more motivated and recognize his thought patterns that are problematic and feed his depression and anxiety.  Short term goal: Learn and implement personal skills for managing stress, solving daily problems, and resolving conflicts effectively. Progressing:  Patient motivated and working on improving  his stress and conflict/anger management skills to help in resolving daily problems and conflict resolution.    Next appt in 2-3 weeks.   Shanon Ace, LCSW

## 2019-08-13 ENCOUNTER — Other Ambulatory Visit: Payer: Self-pay

## 2019-08-13 ENCOUNTER — Ambulatory Visit (INDEPENDENT_AMBULATORY_CARE_PROVIDER_SITE_OTHER): Payer: Medicare Other | Admitting: Psychiatry

## 2019-08-13 ENCOUNTER — Telehealth: Payer: Self-pay | Admitting: Gastroenterology

## 2019-08-13 DIAGNOSIS — F331 Major depressive disorder, recurrent, moderate: Secondary | ICD-10-CM

## 2019-08-13 NOTE — Telephone Encounter (Signed)
Seen 10 months ago for GERD and constipation. Calls today with complaints of constipation. He used Linzess with success. Stopped it because it started causing "terrible stomach cramps." He then began using Ducolax tablets. Those stopped working very well. He is now taking Ducolax Liquid (magnesium hydroxide). He still feels bloated and uncomfortable "like something is still in there." His low back has begun to hurt. Denies any history of back issues. Please advise.

## 2019-08-13 NOTE — Progress Notes (Signed)
[facility] Counselor/Therapist Progress Note  Patient ID: Sergio Griffin, MRN: [id number],    Date: 08/13/2019  Time Spent: 60 minutes   1:00pm to 2:00pm  Treatment Type: Individual Therapy  Virtual Visit Video Note Connected with patient by a video enabled telemedicine/telehealth application or telephone, with their informed consent, and verified patient privacy and that I am speaking with the correct person using two identifiers. I discussed the limitations, risks, security and privacy concerns of performing psychotherapy and management service by telephone and the availability of in person appointments. I also discussed with the patient that there may be a patient responsible charge related to this service. The patient expressed understanding and agreed to proceed. I discussed the treatment planning with the patient. The patient was provided an opportunity to ask questions and all were answered. The patient agreed with the plan and demonstrated an understanding of the instructions. The patient was advised to call  our office if  symptoms worsen or feel they are in a crisis state and need immediate contact.   Therapist Location: [facility] Patient Location: home   Reported Symptoms:  Anxiety, depression, frustration  Mental Status Exam:  Appearance:   Casual     Behavior:  Appropriate and Sharing  Motor:  Normal  Speech/Language:   Normal Rate  Affect:  Depressed and anxious  Mood:  anxious and depressed  Thought process:  normal  Thought content:    WNL  Sensory/Perceptual disturbances:    WNL  Orientation:  oriented to person, place, time/date, situation, day of week, month of year and year  Attention:  Good  Concentration:  Good  Memory:  WNL  Fund of knowledge:   Good  Insight:    Good  Judgment:   Good  Impulse Control:  Good   Risk Assessment: Danger to Self:  No Self-injurious Behavior: No Danger to Others: No Duty to Warn:no Physical  Aggression / Violence:No  Access to Firearms a concern: No  Gang Involvement:No   Subjective:  Patient today anxious Interventions: Solution-Oriented/Positive Psychology and Ego-Supportive  Diagnosis:   ICD-10-CM   1. Moderate episode of recurrent major depressive disorder ([place])  F33.1       Plan:  Patient not signing tx goal updates on computer screen due to [place].  Treatment Goals: Goals remain on tx plan as patient works on strategies to meet their goals.  Long term goal: Develop healthy cognitive patterns and beliefs about himself and the world that lead to alleviation of depression and anxiety.  Short term goal: Learn and implement personal skills for managing stress, solving daily problems, and resolving conflicts effectively.  Strategy:  Teach patient calming skills, problem solving skills, conflict resolution skills, to manage daily stressors.   Progressing-- Patient motivated and has not made much progress most recently as he has not been feeling well and is trying to get in to see his PCP sooner than 2-3 wks.  Encouraged him to let his adult son know he is not feeling well and also check back with PCP's office for any cancellations.  And to consider if he gets worse, to be willing to be seen at an urgent care clinic or hospital ED.  Aside from this, as he feels better, he will resume goal work by recognizing negative automatic thoughts and interrupt them as they feed his anxiety, depression, and tendency to look for what may go wrong versus right.  Will follow up on next steps at his next appt. Goal review  and progress noted with patient.   Next appt within a 2-3 weeks.   Shanon Ace, LCSW

## 2019-08-14 NOTE — Telephone Encounter (Signed)
Patient is advised. Appointment is scheduled for evaluation.

## 2019-08-14 NOTE — Telephone Encounter (Signed)
Please advise him to start Miralax 1 capful daily, bring him for office visit with me or APP soon. Thanks

## 2019-08-18 ENCOUNTER — Encounter: Payer: Self-pay | Admitting: Gastroenterology

## 2019-08-18 ENCOUNTER — Ambulatory Visit (INDEPENDENT_AMBULATORY_CARE_PROVIDER_SITE_OTHER): Payer: Medicare Other | Admitting: Gastroenterology

## 2019-08-18 VITALS — BP 120/76 | HR 71 | Temp 97.0°F | Ht 71.0 in | Wt 333.4 lb

## 2019-08-18 DIAGNOSIS — Z86010 Personal history of colonic polyps: Secondary | ICD-10-CM | POA: Diagnosis not present

## 2019-08-18 DIAGNOSIS — R0602 Shortness of breath: Secondary | ICD-10-CM

## 2019-08-18 DIAGNOSIS — K5909 Other constipation: Secondary | ICD-10-CM

## 2019-08-18 DIAGNOSIS — K219 Gastro-esophageal reflux disease without esophagitis: Secondary | ICD-10-CM

## 2019-08-18 DIAGNOSIS — R1084 Generalized abdominal pain: Secondary | ICD-10-CM | POA: Diagnosis not present

## 2019-08-18 MED ORDER — SUCRALFATE 1 G PO TABS: 1.0000 g | ORAL_TABLET | Freq: Two times a day (BID) | ORAL | 6 refills | Status: DC

## 2019-08-18 NOTE — Progress Notes (Addendum)
Sergio Griffin    [id number]    05/01/1951  Primary Care Physician:Caplan, Legrand Como, MD  Referring Physician: Moshe Cipro, MD [address],  [place] [address]   Chief complaint:  Constipation, Abdominal pain  HPI: 68 yr M morbid obesity, CAD s/p PCI, obstructive sleep apnea, chronic GERD with esophageal dysmotility and spasms for follow-up visit  He stopped Protonix as it was causing abdominal cramping and diarrhea, he had similar side effects with Pepcid, and is no longer taking it. He denies having regurgitation or heartburn but has epigastric burning sensation.  He is taking Carafate as needed with improvement.  He feels bloated with fullness and lower abdominal pain.  He is unable to evacuate, easily passing small pellets.  He started taking MiraLAX but has not had any significant improvement yet.  He had a heart attack in March 2020 while he was still on Brilinta, had a in-stent restenosis, underwent repeat PCI with stent placement and is currently on Prasugrel   Colonoscopy 2012 by Dr. Algis Greenhouse normal with no polyps.  He was recommended to repeat colonoscopy in 5 years due to family history of colon polyps  CT abdomen pelvis September 08, 2018 showed severe diverticulosis involving left side of the colon with no acute diverticulitis, small nonobstructing left renal calculi and mesenteric panniculitis  Outpatient Encounter Medications as of 08/18/2019  Medication Sig  . amLODipine (NORVASC) 10 MG tablet Take 10 mg by mouth daily.   Marland Kitchen aspirin EC 81 MG tablet Take 81 mg by mouth daily.   Marland Kitchen buPROPion (WELLBUTRIN XL) 300 MG 24 hr tablet Take 1 tablet (300 mg total) by mouth every morning.  . clonazePAM (KLONOPIN) 0.5 MG tablet Take 1 tablet (0.5 mg total) by mouth 2 (two) times daily as needed for anxiety.  . furosemide (LASIX) 20 MG tablet Take 20 mg by mouth daily.   Marland Kitchen latanoprost (XALATAN) 0.005 % ophthalmic solution Place 1 drop into both eyes at  bedtime.  Marland Kitchen losartan (COZAAR) 50 MG tablet Take 50 mg by mouth 2 (two) times daily.   . metoprolol tartrate (LOPRESSOR) 25 MG tablet Take by mouth as directed. Take a 1 tablet in the morning and a 1/2 tablet at night  . nitroGLYCERIN (NITROSTAT) 0.4 MG SL tablet Place 1 tablet (0.4 mg total) under the tongue every 5 (five) minutes x 3 doses as needed for chest pain.  . Omega-3 Fatty Acids (FISH OIL) 1000 MG CAPS Take 1,000 mg by mouth daily.  . ondansetron (ZOFRAN ODT) 4 MG disintegrating tablet Take 1 tablet (4 mg total) by mouth every 8 (eight) hours as needed for nausea or vomiting.  . polyethylene glycol (MIRALAX / GLYCOLAX) 17 g packet Take 17 g by mouth daily.  . prasugrel (EFFIENT) 10 MG TABS tablet Take 10 mg by mouth daily.   . rosuvastatin (CRESTOR) 40 MG tablet Take 40 mg by mouth daily.  . traMADol (ULTRAM) 50 MG tablet Take 1 tablet (50 mg total) by mouth every 12 (twelve) hours as needed for moderate pain or severe pain (M79.3).  Marland Kitchen linaclotide (LINZESS) 72 MCG capsule Take 1 capsule (72 mcg total) by mouth daily before breakfast. (Patient not taking: Reported on 08/18/2019)  . [DISCONTINUED] famotidine (PEPCID) 40 MG tablet Take 1 tablet (40 mg total) by mouth daily. (Patient not taking: Reported on 03/12/2019)   No facility-administered encounter medications on file as of 08/18/2019.     Allergies as of  08/18/2019 - Review Complete 08/18/2019  Allergen Reaction Noted  . Other Itching 06/05/2017  . Sulfa antibiotics Itching 06/10/2015    Past Medical History:  Diagnosis Date  . Anxiety   . Barrett's esophagus   . CAD (coronary artery disease)    PCI, stent 2011  . Cardiac arrhythmia   . Chicken pox   . Depression   . Diverticulosis   . Gastric ulcer   . GERD (gastroesophageal reflux disease)   . Glaucoma   . Heart attack ([place]) 01/29/2019  . History of myocardial infarction   . Hyperlipidemia   . Hypertension   . Kidney stones   . Kidney stones   . Pneumonia 11/2014   . Prostatitis   . Reflux   . Sleep apnea   . UTI (lower urinary tract infection)     Past Surgical History:  Procedure Laterality Date  . cardiac stents     last one 01/29/2019  . ESOPHAGEAL MANOMETRY N/A 09/11/2017   Procedure: ESOPHAGEAL MANOMETRY (EM);  Surgeon: Mauri Pole, MD;  Location: [facility];  Service: Endoscopy;  Laterality: N/A;  . EYE SURGERY Left    x 4  . EYE SURGERY Right    x 3    Family History  Problem Relation Age of Onset  . Arthritis Mother   . Hyperlipidemia Mother   . Stroke Mother   . Hypertension Mother   . Ovarian cancer Mother   . Hyperlipidemia Father   . Heart disease Father   . Hypertension Father   . Non-Hodgkin's lymphoma Father   . Hyperlipidemia Maternal Grandmother   . Heart disease Maternal Grandmother   . Stroke Maternal Grandmother   . Hypertension Maternal Grandmother   . Diabetes Maternal Grandmother   . Hyperlipidemia Maternal Grandfather   . Heart disease Maternal Grandfather   . Hypertension Maternal Grandfather   . Heart disease Paternal Grandmother   . Heart disease Paternal Grandfather   . Stroke Paternal Grandfather   . Hypertension Paternal Grandfather   . Congestive Heart Failure Brother   . Colon cancer Neg Hx   . Esophageal cancer Neg Hx   . Pancreatic cancer Neg Hx   . Stomach cancer Neg Hx   . Liver disease Neg Hx     Social History   Socioeconomic History  . Marital status: Divorced    Spouse name: Not on file  . Number of children: 2  . Years of education: 27  . Highest education level: Not on file  Occupational History  . 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 Financial resource strain: Not on file  . Food insecurity    Worry: Not on file    Inability: Not on file  . Transportation needs    Medical: Not on file    Non-medical: Not on file  Tobacco Use  . Smoking status: Former Smoker    Types: Cigarettes  . Smokeless tobacco: Never Used  .  Tobacco comment: quit 1987  Substance and Sexual Activity  . Alcohol use: No  . Drug use: No  . Sexual activity: Not Currently    Partners: Female  Lifestyle  . Physical activity    Days per week: Not on file    Minutes per session: Not on file  . Stress: Not on file  Relationships  . Social Herbalist on phone: Not on file    Gets together: Not on file    Attends religious  service: Not on file    Active member of club or organization: Not on file    Attends meetings of clubs or organizations: Not on file    Relationship status: Not on file  . Intimate partner violence    Fear of current or ex partner: Not on file    Emotionally abused: Not on file    Physically abused: Not on file    Forced sexual activity: Not on file  Other Topics Concern  . Not on file  Social History Narrative   Fun: Paint, cook, travel   Denies religious beliefs effecting health care.       Review of systems: Review of Systems  Constitutional: Negative for fever and chills.  Positive for lack of energy HENT: Sinus problem Eyes: Negative for blurred vision.  Respiratory: Negative for cough and wheezing. positive for shortness of breath.   Cardiovascular: Negative for chest pain and palpitations.  Gastrointestinal: as per HPI Genitourinary: Negative for dysuria, urgency, frequency and hematuria.  Musculoskeletal: Positive for myalgias, back pain and joint pain.  Skin: Negative for itching and rash.  Neurological: Negative for dizziness, tremors, focal weakness, seizures and loss of consciousness.  Endo/Heme/Allergies: Positive for seasonal allergies.  Psychiatric/Behavioral: Negative for suicidal ideas and hallucinations. Positive for depression and anxiety. All other systems reviewed and are negative.   Physical Exam: Vitals:   08/18/19 1014  BP: 120/76  Pulse: 71  Temp: (!) 97 F (36.1 C)   Body mass index is 46.5 kg/m. Gen:      No acute distress HEENT:  EOMI, sclera  anicteric Neck:     No masses; no thyromegaly Lungs:    Clear to auscultation bilaterally; normal respiratory effort CV:         Regular rate and rhythm; no murmurs Abd:      + bowel sounds; soft, non-tender; no palpable masses, no distension Ext:    No edema; adequate peripheral perfusion Skin:      Warm and dry; no rash Neuro: alert and oriented x 3 Psych: normal mood and affect  Data Reviewed:  Reviewed labs, radiology imaging, old records and pertinent past GI work up   Assessment and Plan/Recommendations:  68 year old male with morbid obesity, CAD s/p PCI March 2020 on Prasugrel with worsening constipation and abdominal pain  We will do bowel purge with MiraLAX followed by MiraLAX 1 capful twice daily, titrate dose based on response to have 1 soft bowel movement daily. Increase dietary fiber and fluid intake  He is past due for surveillance colonoscopy but given he needed uninterrupted anticoagulation, it was not scheduled in the past year. Will request cardiology clearance prior to scheduling colonoscopy given recent history of MI and patient is also complaining of new worsening shortness of breath.  GERD continue antireflux measures. Patient does not want start PPI or H2 blocker. Okay to use Carafate as needed  25 minutes was spent face-to-face with the patient. Greater than 50% of the time used for counseling as well as treatment plan and follow-up. He had multiple questions which were answered to his satisfaction  K. Denzil Magnuson , MD    CC: Moshe Cipro, MD

## 2019-08-18 NOTE — Patient Instructions (Addendum)
We will refill your carafate today  Dr Silverio Decamp recommends that you complete a bowel purge (to clean out your bowels). Please do the following: Purchase a bottle of Miralax over the counter as well as a box of 5 mg dulcolax tablets. Take 4 dulcolax tablets. Wait 1 hour. You will then drink 6-8 capfuls of Miralax mixed in an adequate amount of water/juice/gatorade (you may choose which of these liquids to drink) over the next 2-3 hours. You should expect results within 1 to 6 hours after completing the bowel purge.  Take Miralax 1 capful daily  We will contact your cardiologist in [place] for clearance for you to be able to have a colonoscopy if ok we will contact you to schedule and to get clearance of your blood thinner  If you are age 69 or older, your body mass index should be between 23-30. Your Body mass index is 46.5 kg/m. If this is out of the aforementioned range listed, please consider follow up with your Primary Care Provider.  If you are age 46 or younger, your body mass index should be between 19-25. Your Body mass index is 46.5 kg/m. If this is out of the aformentioned range listed, please consider follow up with your Primary Care Provider.    I appreciate the  opportunity to care for you  Thank You   Harl Bowie , MD

## 2019-08-19 ENCOUNTER — Encounter: Payer: Self-pay | Admitting: Gastroenterology

## 2019-08-19 ENCOUNTER — Telehealth: Payer: Self-pay | Admitting: *Deleted

## 2019-08-19 NOTE — Telephone Encounter (Signed)
I need the name of patients cardiologist in [place] to check with them and see if its ok to schedule a colonoscopy, Pt has an appointment with cardiologist next week and will ask his doctor at that time and call me back to let me know if he can schedule

## 2019-08-27 ENCOUNTER — Ambulatory Visit (INDEPENDENT_AMBULATORY_CARE_PROVIDER_SITE_OTHER): Payer: Medicare Other | Admitting: Psychiatry

## 2019-08-27 ENCOUNTER — Other Ambulatory Visit: Payer: Self-pay

## 2019-08-27 DIAGNOSIS — F331 Major depressive disorder, recurrent, moderate: Secondary | ICD-10-CM
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
## 2019-09-03 ENCOUNTER — Telehealth: Payer: Self-pay | Admitting: *Deleted

## 2019-09-03 DIAGNOSIS — H401133 Primary open-angle glaucoma, bilateral, severe stage: Secondary | ICD-10-CM | POA: Diagnosis not present

## 2019-09-03 NOTE — Telephone Encounter (Signed)
Patient called me back and told me he has rescheduled his cardiology appointment until day  10/29, will call back afterwards

## 2019-09-03 NOTE — Telephone Encounter (Signed)
FYI- Dr Silverio Decamp, Patient states he is still constipated , the bowel purge helped but now feels like he is constipated again, Was using Linzess 72 mcg samples but was causing abdominal cramping so he has been using 1 capful of miralax daily with no help,  I told him to increase his water intake and use 2 capfuls of Miralax instead if the 1 and titrate as needed. He will try that and call back next week if no better

## 2019-09-04 NOTE — Telephone Encounter (Signed)
Ok thank you 

## 2019-09-10 ENCOUNTER — Ambulatory Visit (INDEPENDENT_AMBULATORY_CARE_PROVIDER_SITE_OTHER): Payer: Medicare Other | Admitting: Psychiatry

## 2019-09-10 ENCOUNTER — Other Ambulatory Visit: Payer: Self-pay

## 2019-09-10 DIAGNOSIS — F331 Major depressive disorder, recurrent, moderate: Secondary | ICD-10-CM | POA: Diagnosis not present

## 2019-09-10 NOTE — Progress Notes (Signed)
[facility] Counselor/Therapist Progress Note  Patient ID: Sergio Griffin, MRN: [id number],    Date: 09/10/2019  Time Spent: 60 minutes   10:00am to 11:00am  Virtual Visit with Video Note Connected with patient by a video enabled telemedicine/telehealth application or telephone, with their informed consent, and verified patient privacy and that I am speaking with the correct person using two identifiers. I discussed the limitations, risks, security and privacy concerns of performing psychotherapy and management service by telephone and the availability of in person appointments. I also discussed with the patient that there may be a patient responsible charge related to this service. The patient expressed understanding and agreed to proceed. I discussed the treatment planning with the patient. The patient was provided an opportunity to ask questions and all were answered. The patient agreed with the plan and demonstrated an understanding of the instructions. The patient was advised to call  our office if  symptoms worsen or feel they are in a crisis state and need immediate contact.   Therapist Location: [facility] Patient Location: home   Treatment Type: Individual Therapy  Reported Symptoms: depression, worrying, anxiety, concerned about health problems  Mental Status Exam:  Appearance:   Casual     Behavior:  Appropriate and Sharing  Motor:  Normal  Speech/Language:   Normal Rate  Affect:  Depressed and anxious  Mood:  anxious and depressed  Thought process:  normal  Thought content:    WNL  Sensory/Perceptual disturbances:    WNL  Orientation:  oriented to person, place, time/date, situation, day of week, month of year and year  Attention:  Good  Concentration:  Good  Memory:  patient reports some forgetfulness at times  Fund of knowledge:   Good  Insight:    Good  Judgment:   Good  Impulse Control:  Good   Risk Assessment: Danger to Self:   No Self-injurious Behavior: No Danger to Others: No Duty to Warn:no Physical Aggression / Violence:No  Access to Firearms a concern: No  Gang Involvement:No   Subjective: Patient trying to manage his depressive feelings and anxiety.  Having some health concerns that has contributed to his worrying and anxiety, and feeling some overwhelmedness.  Lives alone and does not have much contact with others since he has had increased health issues.  Trying to follow all the COVID precautions.  Adult son is checking on him frequently which helps.  He freely vented how he "is feeling his age by tiring more easily, feeling lonely sometimes, having more health issues."  Trying not to "be so negative."   Interventions: Cognitive Behavioral Therapy and Ego-Supportive  Diagnosis:   ICD-10-CM   1. Moderate episode of recurrent major depressive disorder ([place])  F33.1      Plan: Patient not signing tx goal updates on computer screen due to [place].  Treatment Goals: Goals remain on tx plan as patient works on strategies to [employer].  Long term goal: Develop healthy cognitive patterns and beliefs about himself and the world that lead to alleviation of depression and anxiety.  Short term goal: Learn and implement personal skills for managing stress, solving daily problems, and resolving conflicts effectively.  Strategy: Teach patient calming skills, problem solving skills, conflict resolution skills, to manage daily stressors.  PROGRESS: Patient is working on both his long and short term therapy goals.  Trying to better manage stress and is making more efforts to "stay in the present" rather than "re-hashing the past" and not  jumping forward "and borrowing stress and worries". He reflected on his difficulty staying in the present and we discussed how he might narrow his focus a bit more and concentrate on a few things in the present that tend to be more encouraging for him such as  interactions with him 73 months old grandson, contacts with adult son, and that he and brother have gotten along a little better now.  Also supported his limiting his watching of negative or anxiety-producing world news etc that tends to feel his anxiety and fears. Patient added that he is also limiting time he talks with friends who seem to focus more on the negative vs positive.  Is to seen his Dr again on 09/17/2019. Reviewed calming strategies again including getting outside some, walking a bit more when able, deep breathing exercises, and being in touch with family and friends that are positive for him. To continue to follow his Dr's advice, taking care of himself, and working on his therapy goals re: negative/anxious thoughts.  Goal review and "positives" noted with patient.    Next appt within 2 weeks.   Shanon Ace, LCSW

## 2019-09-17 DIAGNOSIS — I2510 Atherosclerotic heart disease of native coronary artery without angina pectoris: Secondary | ICD-10-CM | POA: Diagnosis not present

## 2019-09-17 DIAGNOSIS — I10 Essential (primary) hypertension: Secondary | ICD-10-CM | POA: Diagnosis not present

## 2019-09-17 DIAGNOSIS — E782 Mixed hyperlipidemia: Secondary | ICD-10-CM | POA: Diagnosis not present

## 2019-09-17 DIAGNOSIS — R079 Chest pain, unspecified: Secondary | ICD-10-CM | POA: Diagnosis not present

## 2019-09-21 DIAGNOSIS — R0609 Other forms of dyspnea: Secondary | ICD-10-CM | POA: Diagnosis not present

## 2019-09-21 DIAGNOSIS — I272 Pulmonary hypertension, unspecified: Secondary | ICD-10-CM | POA: Diagnosis not present

## 2019-09-21 DIAGNOSIS — I517 Cardiomegaly: Secondary | ICD-10-CM | POA: Diagnosis not present

## 2019-09-21 DIAGNOSIS — I361 Nonrheumatic tricuspid (valve) insufficiency: Secondary | ICD-10-CM | POA: Diagnosis not present

## 2019-09-21 DIAGNOSIS — R0602 Shortness of breath: Secondary | ICD-10-CM | POA: Diagnosis not present

## 2019-09-21 DIAGNOSIS — R05 Cough: Secondary | ICD-10-CM | POA: Diagnosis not present

## 2019-09-21 DIAGNOSIS — R5383 Other fatigue: Secondary | ICD-10-CM | POA: Diagnosis not present

## 2019-09-22 ENCOUNTER — Other Ambulatory Visit: Payer: Self-pay

## 2019-09-22 ENCOUNTER — Encounter: Payer: Self-pay | Admitting: Psychiatry

## 2019-09-22 ENCOUNTER — Ambulatory Visit (INDEPENDENT_AMBULATORY_CARE_PROVIDER_SITE_OTHER): Payer: Medicare Other | Admitting: Psychiatry

## 2019-09-22 DIAGNOSIS — F5101 Primary insomnia: Secondary | ICD-10-CM

## 2019-09-22 DIAGNOSIS — F331 Major depressive disorder, recurrent, moderate: Secondary | ICD-10-CM

## 2019-09-22 DIAGNOSIS — F410 Panic disorder [episodic paroxysmal anxiety] without agoraphobia: Secondary | ICD-10-CM

## 2019-09-22 MED ORDER — CLONAZEPAM 0.5 MG PO TABS: 0.5000 mg | ORAL_TABLET | Freq: Two times a day (BID) | ORAL | 0 refills | Status: DC | PRN

## 2019-09-22 MED ORDER — BUPROPION HCL ER (XL) 300 MG PO TB24: 300.0000 mg | ORAL_TABLET | ORAL | 0 refills | Status: DC

## 2019-09-22 NOTE — Progress Notes (Signed)
Sergio Griffin [id number] [birth year] 68 y.o.  Virtual Visit via Telephone Note  I connected with pt on 09/22/19 at 11:30 AM EST by telephone and verified that I am speaking with the correct person using two identifiers.   I discussed the limitations, risks, security and privacy concerns of performing an evaluation and management service by telephone and the availability of in person appointments. I also discussed with the patient that there may be a patient responsible charge related to this service. The patient expressed understanding and agreed to proceed.   I discussed the assessment and treatment plan with the patient. The patient was provided an opportunity to ask questions and all were answered. The patient agreed with the plan and demonstrated an understanding of the instructions.   The patient was advised to call back or seek an in-person evaluation if the symptoms worsen or if the condition fails to improve as anticipated.  I provided 30 minutes of non-face-to-face time during this encounter.  The patient was located at home.  The provider was located at [place].   Thayer Headings, PMHNP   Subjective:   Patient ID:  Sergio Griffin is a 68 y.o. (DOB 03-15-1951) male.  Chief Complaint:  Chief Complaint  Patient presents with  . Follow-up    Anxiety, Depression, insomnia    HPI Sergio Griffin presents for follow-up of depression and anxiety.   He reports that he has been experiencing recent increased fatigue to the point of having to take a shower 12 hours in advance of an apt because it will deplete his energy. He reports that he has had some increased anxiety with recent stressors. He reports that he has been waking up in the night periodically with SOB and panic s/s and will take Klonopin prn with some improvement. Reports typically having multiple awakenings a night. Reports that he stopped using Bipap when he was sleeping better and for longer periods of  time. Reports sleep has been more sporadic since he has been having SOB. Has been having more stress dreams. Notices tension in neck and shoulder and HA with stress and anxiety. He reports that he has been trying to redirect anxious and negative thoughts. Also intentionally taking deep breaths. Reports that he has been avoiding certain people and conversations about politics. "The last few days I have really felt alone and lonely." He reports that he is going 7-10 days without face to face contact with someone. Denies worsening depression. Appetite has been fluctuating with GI s/s. Able to concentrate when there are minimal distractions. Denies SI.   Past Psychiatric Medication Trials: Prozac- Adverse effects Wellbutrin- helpful for depression Celexa- Affective dulling Lexapro Imipramine- Helpful for anxiety, worsened glaucoma Klonopin- effective Ambien-ineffective.  Review of Systems:  Review of Systems  Constitutional: Positive for fatigue.  Respiratory: Positive for cough and shortness of breath.   Cardiovascular: Positive for leg swelling.       Saw cardiologist yesterday and has f/u in one week.   Gastrointestinal: Positive for constipation.       Reflux  Musculoskeletal: Negative for gait problem.  Neurological: Negative for tremors.  Psychiatric/Behavioral:       Please refer to HPI    Medications: I have reviewed the patient's current medications.  Current Outpatient Medications  Medication Sig Dispense Refill  . amLODipine (NORVASC) 10 MG tablet Take 10 mg by mouth daily.     Marland Kitchen aspirin EC 81 MG tablet Take 81 mg by mouth daily.     Marland Kitchen  buPROPion (WELLBUTRIN XL) 300 MG 24 hr tablet Take 1 tablet (300 mg total) by mouth every morning. 90 tablet 0  . [START ON 09/24/2019] clonazePAM (KLONOPIN) 0.5 MG tablet Take 1 tablet (0.5 mg total) by mouth 2 (two) times daily as needed for anxiety. 180 tablet 0  . furosemide (LASIX) 20 MG tablet Take 20 mg by mouth daily.     Marland Kitchen latanoprost  (XALATAN) 0.005 % ophthalmic solution Place 1 drop into both eyes at bedtime.    Marland Kitchen linaclotide (LINZESS) 72 MCG capsule Take 1 capsule (72 mcg total) by mouth daily before breakfast. 30 capsule 3  . losartan (COZAAR) 50 MG tablet Take 50 mg by mouth 2 (two) times daily.     . metoprolol tartrate (LOPRESSOR) 25 MG tablet Take by mouth as directed. Take a 1 tablet in the morning and a 1/2 tablet at night    . Omega-3 Fatty Acids (FISH OIL) 1000 MG CAPS Take 1,000 mg by mouth daily.    . polyethylene glycol (MIRALAX / GLYCOLAX) 17 g packet Take 17 g by mouth daily.    . prasugrel (EFFIENT) 10 MG TABS tablet Take 10 mg by mouth daily.     . rosuvastatin (CRESTOR) 40 MG tablet Take 40 mg by mouth daily.    . sucralfate (CARAFATE) 1 g tablet Take 1 tablet (1 g total) by mouth 2 (two) times daily. As needed 60 tablet 6  . nitroGLYCERIN (NITROSTAT) 0.4 MG SL tablet Place 1 tablet (0.4 mg total) under the tongue every 5 (five) minutes x 3 doses as needed for chest pain. 30 tablet 1  . ondansetron (ZOFRAN ODT) 4 MG disintegrating tablet Take 1 tablet (4 mg total) by mouth every 8 (eight) hours as needed for nausea or vomiting. 30 tablet 1  . traMADol (ULTRAM) 50 MG tablet Take 1 tablet (50 mg total) by mouth every 12 (twelve) hours as needed for moderate pain or severe pain (M79.3). (Patient not taking: Reported on 09/22/2019) 28 tablet 0   No current facility-administered medications for this visit.     Medication Side Effects: None  Allergies:  Allergies  Allergen Reactions  . Other Itching    Darvocet-itching  . Sulfa Antibiotics Itching    Past Medical History:  Diagnosis Date  . Anxiety   . Barrett's esophagus   . CAD (coronary artery disease)    PCI, stent 2011  . Cardiac arrhythmia   . Chicken pox   . Depression   . Diverticulosis   . Gastric ulcer   . GERD (gastroesophageal reflux disease)   . Glaucoma   . Heart attack ([place]) 01/29/2019  . History of myocardial infarction   .  Hyperlipidemia   . Hypertension   . Kidney stones   . Kidney stones   . Pneumonia 11/2014  . Prostatitis   . Reflux   . Sleep apnea   . UTI (lower urinary tract infection)     Family History  Problem Relation Age of Onset  . Arthritis Mother   . Hyperlipidemia Mother   . Stroke Mother   . Hypertension Mother   . Ovarian cancer Mother   . Hyperlipidemia Father   . Heart disease Father   . Hypertension Father   . Non-Hodgkin's lymphoma Father   . Hyperlipidemia Maternal Grandmother   . Heart disease Maternal Grandmother   . Stroke Maternal Grandmother   . Hypertension Maternal Grandmother   . Diabetes Maternal Grandmother   . Hyperlipidemia Maternal Grandfather   .  Heart disease Maternal Grandfather   . Hypertension Maternal Grandfather   . Heart disease Paternal Grandmother   . Heart disease Paternal Grandfather   . Stroke Paternal Grandfather   . Hypertension Paternal Grandfather   . Congestive Heart Failure Brother   . Colon cancer Neg Hx   . Esophageal cancer Neg Hx   . Pancreatic cancer Neg Hx   . Stomach cancer Neg Hx   . Liver disease Neg Hx     Social History   Socioeconomic History  . Marital status: Divorced    Spouse name: Not on file  . Number of children: 2  . Years of education: 43  . Highest education level: Not on file  Occupational History  . 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 Financial resource strain: Not on file  . Food insecurity    Worry: Not on file    Inability: Not on file  . Transportation needs    Medical: Not on file    Non-medical: Not on file  Tobacco Use  . Smoking status: Former Smoker    Types: Cigarettes  . Smokeless tobacco: Never Used  . Tobacco comment: quit 1987  Substance and Sexual Activity  . Alcohol use: No  . Drug use: No  . Sexual activity: Not Currently    Partners: Female  Lifestyle  . Physical activity    Days per week: Not on file    Minutes per session: Not on  file  . Stress: Not on file  Relationships  . Social Herbalist on phone: Not on file    Gets together: Not on file    Attends religious service: Not on file    Active member of club or organization: Not on file    Attends meetings of clubs or organizations: Not on file    Relationship status: Not on file  . Intimate partner violence    Fear of current or ex partner: Not on file    Emotionally abused: Not on file    Physically abused: Not on file    Forced sexual activity: Not on file  Other Topics Concern  . Not on file  Social History Narrative   Fun: Paint, cook, travel   Denies religious beliefs effecting health care.     Past Medical History, Surgical history, Social history, and Family history were reviewed and updated as appropriate.   Please see review of systems for further details on the patient's review from today.   Objective:   Physical Exam:  There were no vitals taken for this visit.  Physical Exam Constitutional:      General: He is not in acute distress.    Appearance: He is well-developed.  Neurological:     Mental Status: He is alert and oriented to person, place, and time.     Coordination: Coordination normal.  Psychiatric:        Attention and Perception: Attention and perception normal. He does not perceive auditory or visual hallucinations.        Mood and Affect: Mood is anxious and depressed. Affect is not labile, blunt, angry or inappropriate.        Speech: Speech normal.        Behavior: Behavior normal.        Thought Content: Thought content normal. Thought content is not paranoid or delusional. Thought content does not include homicidal or suicidal ideation. Thought content does not include homicidal or suicidal  plan.        Cognition and Memory: Cognition and memory normal.        Judgment: Judgment normal.     Comments: Insight intact.      Lab Review:     Component Value Date/Time   NA 139 09/03/2018 0924   K 3.7  09/03/2018 0924   CL 109 09/03/2018 0924   CO2 24 09/03/2018 0924   GLUCOSE 112 (H) 09/03/2018 0924   BUN 20 09/03/2018 0924   CREATININE 1.33 09/03/2018 0924   CALCIUM 8.4 09/03/2018 0924   PROT 6.4 (L) 06/23/2015 0520   ALBUMIN 2.6 (L) 06/23/2015 0520   AST 22 06/23/2015 0520   ALT 18 06/23/2015 0520   ALKPHOS 33 (L) 06/23/2015 0520   BILITOT 0.4 06/23/2015 0520   GFRNONAA 56 (L) 06/29/2015 0615   GFRAA >60 06/29/2015 0615       Component Value Date/Time   WBC 12.0 (H) 09/15/2018 1421   RBC 5.04 09/15/2018 1421   HGB 14.6 09/15/2018 1421   HCT 44.2 09/15/2018 1421   PLT 290.0 09/15/2018 1421   MCV 87.5 09/15/2018 1421   MCH 27.5 06/23/2015 0520   MCHC 33.0 09/15/2018 1421   RDW 14.3 09/15/2018 1421   LYMPHSABS 2.7 09/15/2018 1421   MONOABS 0.7 09/15/2018 1421   EOSABS 0.8 (H) 09/15/2018 1421   BASOSABS 0.1 09/15/2018 1421    No results found for: POCLITH, LITHIUM   No results found for: PHENYTOIN, PHENOBARB, VALPROATE, CBMZ   .res Assessment: Plan:   Pt seen for 30 minutes and greater than 50% of visit spent counseling pt and coordination of care to include discussing and reviewing recent medical issues and how these health issues have impacted his anxiety and mood and vice versa. Pt reports that he would like to continue current medication at this time while completing medical work-up. Recommend continuing psychotherapy with Rinaldo Cloud, LCSW. Patient to follow-up with this provider in 2 months or sooner if clinically indicated. Patient advised to contact office with any questions, adverse effects, or acute worsening in signs and symptoms.  Loukas was seen today for follow-up.  Diagnoses and all orders for this visit:  Moderate episode of recurrent major depressive disorder (HCC) -     buPROPion (WELLBUTRIN XL) 300 MG 24 hr tablet; Take 1 tablet (300 mg total) by mouth every morning.  Panic disorder -     clonazePAM (KLONOPIN) 0.5 MG tablet; Take 1 tablet (0.5  mg total) by mouth 2 (two) times daily as needed for anxiety.  Primary insomnia    Please see After Visit Summary for patient specific instructions.  No future appointments.  No orders of the defined types were placed in this encounter.     -------------------------------

## 2019-09-29 DIAGNOSIS — I10 Essential (primary) hypertension: Secondary | ICD-10-CM | POA: Diagnosis not present

## 2019-09-29 DIAGNOSIS — R7309 Other abnormal glucose: Secondary | ICD-10-CM | POA: Diagnosis not present

## 2019-09-29 DIAGNOSIS — I2510 Atherosclerotic heart disease of native coronary artery without angina pectoris: Secondary | ICD-10-CM | POA: Diagnosis not present

## 2019-09-29 DIAGNOSIS — R9431 Abnormal electrocardiogram [ECG] [EKG]: Secondary | ICD-10-CM | POA: Diagnosis not present

## 2019-09-29 DIAGNOSIS — E785 Hyperlipidemia, unspecified: Secondary | ICD-10-CM | POA: Diagnosis not present

## 2019-09-29 DIAGNOSIS — R0609 Other forms of dyspnea: Secondary | ICD-10-CM | POA: Diagnosis not present

## 2019-09-29 DIAGNOSIS — R5383 Other fatigue: Secondary | ICD-10-CM | POA: Diagnosis not present

## 2019-09-29 DIAGNOSIS — R079 Chest pain, unspecified: Secondary | ICD-10-CM | POA: Diagnosis not present

## 2019-09-29 DIAGNOSIS — R0602 Shortness of breath: Secondary | ICD-10-CM | POA: Diagnosis not present

## 2019-10-06 ENCOUNTER — Telehealth: Payer: Self-pay | Admitting: Gastroenterology

## 2019-10-06 NOTE — Telephone Encounter (Signed)
Patient anted to let Dr. Silverio Decamp know he  he was waiting on reports back from his cardiologist to schedule colonoscopy but has not gotten the reports yet. Also said last night he started feeling constipated and then got some abdominal pain and was sweating and feeling terrible. Michela Pitcher that it comes and goes but said he did have a little blood in his stool yesterday.

## 2019-10-06 NOTE — Telephone Encounter (Signed)
Spoke with the patient.  Report abdominal discomfort that is on the left side and some to the right. He states it is low and he feels "heavy." He has been taking daily Miralax. Taking Linzess every 3rd day. He sat on the commode for 2 hours last night. Every time he wanted to get up he felt he needed to move his bowels and could not leave. He did not pass much stool. He passed hard stool when he did. He denies constipation symptom until yesterday. Afebrile, no nausea or vomiting. He feels tender on the left side. Should I bring him in to be seen? I can get him in tomorrow.

## 2019-10-07 ENCOUNTER — Encounter: Payer: Self-pay | Admitting: Physician Assistant

## 2019-10-07 ENCOUNTER — Ambulatory Visit (INDEPENDENT_AMBULATORY_CARE_PROVIDER_SITE_OTHER): Payer: Medicare Other | Admitting: Physician Assistant

## 2019-10-07 ENCOUNTER — Ambulatory Visit (HOSPITAL_COMMUNITY)
Admission: RE | Admit: 2019-10-07 | Discharge: 2019-10-07 | Disposition: A | Discharge status: Home / Self Care | Payer: Medicare Other | Source: Ambulatory Visit | Attending: Physician Assistant | Admitting: Physician Assistant

## 2019-10-07 ENCOUNTER — Other Ambulatory Visit (INDEPENDENT_AMBULATORY_CARE_PROVIDER_SITE_OTHER): Payer: Medicare Other

## 2019-10-07 ENCOUNTER — Other Ambulatory Visit: Payer: Self-pay

## 2019-10-07 VITALS — BP 124/72 | HR 78 | Temp 98.0°F | Ht 71.0 in | Wt 336.0 lb

## 2019-10-07 DIAGNOSIS — R1084 Generalized abdominal pain: Secondary | ICD-10-CM

## 2019-10-07 DIAGNOSIS — R194 Change in bowel habit: Secondary | ICD-10-CM

## 2019-10-07 DIAGNOSIS — N200 Calculus of kidney: Secondary | ICD-10-CM | POA: Diagnosis not present

## 2019-10-07 LAB — COMPREHENSIVE METABOLIC PANEL
ALT: 24 U/L (ref 0–53)
AST: 17 U/L (ref 0–37)
Albumin: 4.1 g/dL (ref 3.5–5.2)
Alkaline Phosphatase: 47 U/L (ref 39–117)
BUN: 21 mg/dL (ref 6–23)
CO2: 26 mEq/L (ref 19–32)
Calcium: 9.1 mg/dL (ref 8.4–10.5)
Chloride: 100 mEq/L (ref 96–112)
Creatinine, Ser: 1.49 mg/dL (ref 0.40–1.50)
GFR: 46.67 mL/min — ABNORMAL LOW (ref >60.00)
Glucose, Bld: 116 mg/dL — ABNORMAL HIGH (ref 70–99)
Potassium: 4 mEq/L (ref 3.5–5.1)
Sodium: 136 mEq/L (ref 135–145)
Total Bilirubin: 0.5 mg/dL (ref 0.2–1.2)
Total Protein: 6.9 g/dL (ref 6.0–8.3)

## 2019-10-07 LAB — CBC WITH DIFFERENTIAL/PLATELET
Basophils Absolute: 0.2 10*3/uL — ABNORMAL HIGH (ref 0.0–0.1)
Basophils Relative: 1.3 % (ref 0.0–3.0)
Eosinophils Absolute: 0.3 10*3/uL (ref 0.0–0.7)
Eosinophils Relative: 2.2 % (ref 0.0–5.0)
HCT: 46 % (ref 39.0–52.0)
Hemoglobin: 15 g/dL (ref 13.0–17.0)
Lymphocytes Relative: 22 % (ref 12.0–46.0)
Lymphs Abs: 2.7 10*3/uL (ref 0.7–4.0)
MCHC: 32.6 g/dL (ref 30.0–36.0)
MCV: 91.4 fl (ref 78.0–100.0)
Monocytes Absolute: 1 10*3/uL (ref 0.1–1.0)
Monocytes Relative: 8.1 % (ref 3.0–12.0)
Neutro Abs: 8 10*3/uL — ABNORMAL HIGH (ref 1.4–7.7)
Neutrophils Relative %: 66.4 % (ref 43.0–77.0)
Platelets: 241 10*3/uL (ref 150.0–400.0)
RBC: 5.04 Mil/uL (ref 4.22–5.81)
RDW: 14 % (ref 11.5–15.5)
WBC: 12.1 10*3/uL — ABNORMAL HIGH (ref 4.0–10.5)

## 2019-10-07 MED ORDER — LINACLOTIDE 72 MCG PO CAPS: 72.0000 ug | ORAL_CAPSULE | Freq: Every day | ORAL | 3 refills | Status: DC

## 2019-10-07 MED ORDER — IOHEXOL 300 MG/ML  SOLN
100.0000 mL | Freq: Once | INTRAMUSCULAR | Status: AC | PRN
  Administered 2019-10-07: 100 mL via INTRAVENOUS

## 2019-10-07 NOTE — Patient Instructions (Addendum)
If you are age 69 or older, your body mass index should be between 23-30. Your Body mass index is 46.86 kg/m. If this is out of the aforementioned range listed, please consider follow up with your Primary Care Provider.  If you are age 98 or younger, your body mass index should be between 19-25. Your Body mass index is 46.86 kg/m. If this is out of the aformentioned range listed, please consider follow up with your Primary Care Provider.   Your provider has requested that you go to the basement level for lab work before leaving today. Press "B" on the elevator. The lab is located at the first door on the left as you exit the elevator.   We have sent the following medications to your pharmacy for you to pick up at your convenience:  1. Linzess 72 mcg daily.   2. Bowel prep sample given will make recommendation after CT.      You have been scheduled for a CT scan of the abdomen and pelvis at  [facility] main entrance.

## 2019-10-07 NOTE — Telephone Encounter (Signed)
Ok, please bring him in for visit soon.  Advise him to start taking Linzess and Miralax daily. Increase water intake to 60 oz daily. Thanks

## 2019-10-07 NOTE — Telephone Encounter (Signed)
Appointment scheduled.

## 2019-10-07 NOTE — Progress Notes (Signed)
Chief Complaint: Abdominal pain, constipation  HPI:    [name] is a 68 year old male with a past medical history as listed below including CAD status post PCI March 2020 on prasugrel, Barrett's esophagus, reflux and constipation, known to Dr. Silverio Decamp, who presents to clinic today with a complaint of abdominal pain and constipation.    08/18/2019 patient seen in clinic by Dr. Silverio Decamp, at that time as noted patient had a colonoscopy in 2012.  It was recommended he have repeat in 5 years due to his family history of colon polyps.  CT was reviewed from 09/08/2018 which showed severe diverticulosis without acute diverticulitis.  That time patient instructed to do a bowel purge with MiraLAX and then continue with MiraLAX twice daily titrated to response of 1 soft bowel movement daily.  Discussed that he was past due for surveillance colonoscopy but given he needed uninterrupted anticoagulation it was to be scheduled in 2021.    10/06/2019 patient called and reported increasing abdominal pain with constipation and passing some blood.  He was advised to take his Linzess and MiraLAX daily and  increase water intake.    Today, the patient tells me that the last time he was here he did a MiraLAX cleanout after being seen and this did not help at all.  He has continued on MiraLAX, about a dose and "a little more" a day since then but still has trouble with constipation and bloating with abdominal pain.  Apparently uses his Linzess every third day as that is when he becomes so bloated that he can't handle it anymore.  Tells me that "I was not rationing them, but I do not have many left".  Describes that even since being seen last he is still unable to eat a full meal telling me that he can only do half a meal otherwise he feels full and nauseated.      Most acutely on Monday night, 10/05/2019 around 6 PM the patient tried to go to the bathroom but could not have a bowel movement.  He took Linzess and at 730  started with terrible cramping and felt like he had to have an urgent stool.  He passed a few rock hard pebbles and started with a severe 10/10 left lower quadrant pain and felt like he could not leave the commode because every time he stood up it felt like he had to go again.  The pain lasted for 2 to 2-1/2 hours.  Tells me that normally he can handle pain but this was so bad that "I cried".  He then performed some manual disimpaction and drank a lot of warm water and an hour later was able to pass slightly more stool.  The pain continued off and on and would last for about 20 to 30 minutes at a time and then go away for an hour or so and come back.      Currently, the patient tells me that it hurts but it is not as frequent.  Still rated as a 6-7/10 at its worst.  He has not eaten over the past 48 hours.  Does tell me that at 5 AM this morning he passed "just mucus" and no bowel movement.  He has continued his MiraLAX but has not taken another dose of Linzess.   Describes this abdominal pain as being mostly in his left lower quadrant, but it does radiate around his abdomen.  Tells me he did not really see any bright red blood in  his stool but more of a "red clay color".  Patient is dizzy today.    Denies fever, chills or weight loss.  Past Medical History:  Diagnosis Date  . Anxiety   . Barrett's esophagus   . CAD (coronary artery disease)    PCI, stent 2011  . Cardiac arrhythmia   . Chicken pox   . Depression   . Diverticulosis   . Gastric ulcer   . GERD (gastroesophageal reflux disease)   . Glaucoma   . Heart attack ([place]) 01/29/2019  . History of myocardial infarction   . Hyperlipidemia   . Hypertension   . Kidney stones   . Kidney stones   . Pneumonia 11/2014  . Prostatitis   . Reflux   . Sleep apnea   . UTI (lower urinary tract infection)     Past Surgical History:  Procedure Laterality Date  . cardiac stents     last one 01/29/2019  . ESOPHAGEAL MANOMETRY N/A 09/11/2017    Procedure: ESOPHAGEAL MANOMETRY (EM);  Surgeon: Mauri Pole, MD;  Location: [facility];  Service: Endoscopy;  Laterality: N/A;  . EYE SURGERY Left    x 4  . EYE SURGERY Right    x 3    Current Outpatient Medications  Medication Sig Dispense Refill  . amLODipine (NORVASC) 10 MG tablet Take 10 mg by mouth daily.     Marland Kitchen aspirin EC 81 MG tablet Take 81 mg by mouth daily.     Marland Kitchen buPROPion (WELLBUTRIN XL) 300 MG 24 hr tablet Take 1 tablet (300 mg total) by mouth every morning. 90 tablet 0  . clonazePAM (KLONOPIN) 0.5 MG tablet Take 1 tablet (0.5 mg total) by mouth 2 (two) times daily as needed for anxiety. 180 tablet 0  . furosemide (LASIX) 20 MG tablet Take 20 mg by mouth daily.     Marland Kitchen latanoprost (XALATAN) 0.005 % ophthalmic solution Place 1 drop into both eyes at bedtime.    Marland Kitchen linaclotide (LINZESS) 72 MCG capsule Take 1 capsule (72 mcg total) by mouth daily before breakfast. 30 capsule 3  . losartan (COZAAR) 50 MG tablet Take 50 mg by mouth 2 (two) times daily.     . metoprolol tartrate (LOPRESSOR) 25 MG tablet Take by mouth as directed. Take a 1 tablet in the morning and a 1/2 tablet at night    . nitroGLYCERIN (NITROSTAT) 0.4 MG SL tablet Place 1 tablet (0.4 mg total) under the tongue every 5 (five) minutes x 3 doses as needed for chest pain. 30 tablet 1  . Omega-3 Fatty Acids (FISH OIL) 1000 MG CAPS Take 1,000 mg by mouth daily.    . ondansetron (ZOFRAN ODT) 4 MG disintegrating tablet Take 1 tablet (4 mg total) by mouth every 8 (eight) hours as needed for nausea or vomiting. 30 tablet 1  . polyethylene glycol (MIRALAX / GLYCOLAX) 17 g packet Take 17 g by mouth daily.    . prasugrel (EFFIENT) 10 MG TABS tablet Take 10 mg by mouth daily.     . rosuvastatin (CRESTOR) 40 MG tablet Take 40 mg by mouth daily.    . sucralfate (CARAFATE) 1 g tablet Take 1 tablet (1 g total) by mouth 2 (two) times daily. As needed 60 tablet 6  . traMADol (ULTRAM) 50 MG tablet Take 1 tablet (50 mg total) by  mouth every 12 (twelve) hours as needed for moderate pain or severe pain (M79.3). 28 tablet 0   No current facility-administered medications for this visit.  Allergies as of 10/07/2019 - Review Complete 10/07/2019  Allergen Reaction Noted  . Other Itching 06/05/2017  . Sulfa antibiotics Itching 06/10/2015    Family History  Problem Relation Age of Onset  . Arthritis Mother   . Hyperlipidemia Mother   . Stroke Mother   . Hypertension Mother   . Ovarian cancer Mother   . Hyperlipidemia Father   . Heart disease Father   . Hypertension Father   . Non-Hodgkin's lymphoma Father   . Hyperlipidemia Maternal Grandmother   . Heart disease Maternal Grandmother   . Stroke Maternal Grandmother   . Hypertension Maternal Grandmother   . Diabetes Maternal Grandmother   . Hyperlipidemia Maternal Grandfather   . Heart disease Maternal Grandfather   . Hypertension Maternal Grandfather   . Heart disease Paternal Grandmother   . Heart disease Paternal Grandfather   . Stroke Paternal Grandfather   . Hypertension Paternal Grandfather   . Congestive Heart Failure Brother   . Colon cancer Neg Hx   . Esophageal cancer Neg Hx   . Pancreatic cancer Neg Hx   . Stomach cancer Neg Hx   . Liver disease Neg Hx     Social History   Socioeconomic History  . Marital status: Divorced    Spouse name: Not on file  . Number of children: 2  . Years of education: 31  . Highest education level: Not on file  Occupational History  . 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 Financial resource strain: Not on file  . Food insecurity    Worry: Not on file    Inability: Not on file  . Transportation needs    Medical: Not on file    Non-medical: Not on file  Tobacco Use  . Smoking status: Former Smoker    Types: Cigarettes  . Smokeless tobacco: Never Used  . Tobacco comment: quit 1987  Substance and Sexual Activity  . Alcohol use: No  . Drug use: No  . Sexual  activity: Not Currently    Partners: Female  Lifestyle  . Physical activity    Days per week: Not on file    Minutes per session: Not on file  . Stress: Not on file  Relationships  . Social Herbalist on phone: Not on file    Gets together: Not on file    Attends religious service: Not on file    Active member of club or organization: Not on file    Attends meetings of clubs or organizations: Not on file    Relationship status: Not on file  . Intimate partner violence    Fear of current or ex partner: Not on file    Emotionally abused: Not on file    Physically abused: Not on file    Forced sexual activity: Not on file  Other Topics Concern  . Not on file  Social History Narrative   Fun: Paint, cook, travel   Denies religious beliefs effecting health care.     Review of Systems:    Constitutional: No weight loss, fever or chills  Cardiovascular: No chest pain Respiratory: No SOB Gastrointestinal: See HPI and otherwise negative   Physical Exam:  Vital signs: BP 124/72   Pulse 78   Temp 98 F (36.7 C)   Ht 5\' 11"  (1.803 m)   Wt (!) 336 lb (152.4 kg)   SpO2 98%   BMI 46.86 kg/m   Constitutional:   Pleasant obese,  ill appearing, Caucasian male appears to be in mild distress, struggling to catch his breath, Well developed, Well nourished, alert and cooperative Head:  Normocephalic and atraumatic. Eyes:   PEERL, EOMI. No icterus. Conjunctiva pink. Ears:  Normal auditory acuity. Neck:  Supple Throat: Oral cavity and pharynx without inflammation, swelling or lesion.  Respiratory: Respirations even and unlabored. Lungs clear to auscultation bilaterally.   No wheezes, crackles, or rhonchi.  Cardiovascular: Normal S1, S2. No MRG. Regular rate and rhythm. No peripheral edema, cyanosis or pallor.  Gastrointestinal:  Soft, mild distention, marked TTP to only light palpation over entire abdomen, worse in the left lower quadrant with some involuntary guarding, decreased  bowel sounds all 4 quadrants.No appreciable masses or hepatomegaly. Rectal:  Not performed.  Msk:  Symmetrical without gross deformities. Without edema, no deformity or joint abnormality.  Neurologic:  Alert and  oriented x4;  grossly normal neurologically.  Skin:   Dry and intact without significant lesions or rashes. Psychiatric: Demonstrates good judgement and reason without abnormal affect or behaviors.  Labs ordered today: CBC    Component Value Date/Time   WBC 12.1 (H) 10/07/2019 1055   RBC 5.04 10/07/2019 1055   HGB 15.0 10/07/2019 1055   HCT 46.0 10/07/2019 1055   PLT 241.0 10/07/2019 1055   MCV 91.4 10/07/2019 1055   MCH 27.5 06/23/2015 0520   MCHC 32.6 10/07/2019 1055   RDW 14.0 10/07/2019 1055   LYMPHSABS 2.7 10/07/2019 1055   MONOABS 1.0 10/07/2019 1055   EOSABS 0.3 10/07/2019 1055   BASOSABS 0.2 (H) 10/07/2019 1055    CMP     Component Value Date/Time   NA 136 10/07/2019 1055   K 4.0 10/07/2019 1055   CL 100 10/07/2019 1055   CO2 26 10/07/2019 1055   GLUCOSE 116 (H) 10/07/2019 1055   BUN 21 10/07/2019 1055   CREATININE 1.49 10/07/2019 1055   CALCIUM 9.1 10/07/2019 1055   PROT 6.9 10/07/2019 1055   ALBUMIN 4.1 10/07/2019 1055   AST 17 10/07/2019 1055   ALT 24 10/07/2019 1055   ALKPHOS 47 10/07/2019 1055   BILITOT 0.5 10/07/2019 1055   GFRNONAA 56 (L) 06/29/2015 0615   GFRAA >60 06/29/2015 0615    Assessment: 1.  Generalized abdominal pain: Severe pain to only light palpation over entire abdomen, worse in the left lower quadrant, history of severe diverticulosis seen on CT last year, concern for acute diverticulitis now versus constipation 2.  Constipation: Chronic for the patient, likely contributing to above  Plan: 1.  Ordered CBC and CMP.  Ordered CT of the abdomen pelvis with contrast stat today to check for diverticulitis given severe abdominal pain. 2.  Provided the patient with a bowel prep sample of Plenvu.  Explained that pending CT results  might recommend that he complete this. He is nervous about "starting up that pain again" 3.  Patient told to start his Linzess 72 mcg daily, sent him in a new prescription #30 with 5 refills. Continue Miralax qd 4.  Again patient is due for a colonoscopy but needs to be on continuous anticoagulation until March of next year 5.  Patient to follow in clinic per recommendations after labs and imaging above with Dr. Silverio Decamp or myself.  Ellouise Newer, PA-C [facility] 10/07/2019, 12:26 PM  Cc: Moshe Cipro, MD

## 2019-10-08 ENCOUNTER — Other Ambulatory Visit: Payer: Self-pay

## 2019-10-08 MED ORDER — CIPROFLOXACIN HCL 500 MG PO TABS: 500.0000 mg | ORAL_TABLET | Freq: Two times a day (BID) | ORAL | 0 refills | Status: DC

## 2019-10-08 MED ORDER — METRONIDAZOLE 500 MG PO TABS: 500.0000 mg | ORAL_TABLET | Freq: Two times a day (BID) | ORAL | 0 refills | Status: DC

## 2019-10-09 ENCOUNTER — Ambulatory Visit: Payer: Medicare Other | Admitting: Gastroenterology

## 2019-10-13 DIAGNOSIS — I2510 Atherosclerotic heart disease of native coronary artery without angina pectoris: Secondary | ICD-10-CM | POA: Diagnosis not present

## 2019-10-13 DIAGNOSIS — I10 Essential (primary) hypertension: Secondary | ICD-10-CM | POA: Diagnosis not present

## 2019-10-13 DIAGNOSIS — R52 Pain, unspecified: Secondary | ICD-10-CM | POA: Diagnosis not present

## 2019-10-13 DIAGNOSIS — Z87442 Personal history of urinary calculi: Secondary | ICD-10-CM | POA: Diagnosis not present

## 2019-10-13 DIAGNOSIS — N132 Hydronephrosis with renal and ureteral calculous obstruction: Secondary | ICD-10-CM | POA: Diagnosis not present

## 2019-10-13 DIAGNOSIS — Z888 Allergy status to other drugs, medicaments and biological substances status: Secondary | ICD-10-CM | POA: Diagnosis not present

## 2019-10-13 DIAGNOSIS — E7800 Pure hypercholesterolemia, unspecified: Secondary | ICD-10-CM | POA: Diagnosis not present

## 2019-10-13 DIAGNOSIS — N201 Calculus of ureter: Secondary | ICD-10-CM | POA: Diagnosis not present

## 2019-10-13 DIAGNOSIS — R0902 Hypoxemia: Secondary | ICD-10-CM | POA: Diagnosis not present

## 2019-10-13 DIAGNOSIS — M5489 Other dorsalgia: Secondary | ICD-10-CM | POA: Diagnosis not present

## 2019-10-19 ENCOUNTER — Telehealth: Payer: Self-pay | Admitting: Gastroenterology

## 2019-10-19 NOTE — Telephone Encounter (Signed)
Called patient back and he states he has had sever pain in his left side, from his groin to his kidney. He went to the [facility] on 10/13/19 and they did another CT and said he had a kidney stone. Sent him home with oxycodone. He said he checked with his PCP and can not get into the only Urologist in [place] for 3 weeks. Wants to know if we can get him into see a Urologist in [place]. Ellouise Newer PA is off so I am sending this to Dr. Silverio Decamp

## 2019-10-19 NOTE — Telephone Encounter (Signed)
Okay to send referral to urology.  Please check with [facility] if they have any available urgent appointments.  Thank you

## 2019-10-19 NOTE — Telephone Encounter (Signed)
Made urgent referral to [facility] for patient's painful kidney stone, shown on CT. Scheduled 10/20/19 at 8:45am with Dr. Louis Meckel. Patient called with appt. And notified him they would like him to bring the CT report from [facility].

## 2019-10-19 NOTE — Telephone Encounter (Signed)
Alyse Low I sent this to [facility].  The pt last say Anderson Malta.  Thank you

## 2019-10-19 NOTE — Progress Notes (Signed)
Reviewed and agree with documentation and assessment and plan. K. Veena Nandigam , MD   

## 2019-10-20 ENCOUNTER — Other Ambulatory Visit (HOSPITAL_COMMUNITY)
Admission: RE | Admit: 2019-10-20 | Discharge: 2019-10-20 | Disposition: A | Discharge status: Home / Self Care | Payer: Medicare Other | Source: Ambulatory Visit | Attending: Urology | Admitting: Urology

## 2019-10-20 ENCOUNTER — Other Ambulatory Visit: Payer: Self-pay | Admitting: Urology

## 2019-10-20 ENCOUNTER — Other Ambulatory Visit: Payer: Self-pay

## 2019-10-20 ENCOUNTER — Encounter (HOSPITAL_COMMUNITY): Payer: Self-pay

## 2019-10-20 ENCOUNTER — Encounter (HOSPITAL_COMMUNITY)
Admission: RE | Admit: 2019-10-20 | Discharge: 2019-10-20 | Disposition: A | Discharge status: Home / Self Care | Payer: Medicare Other | Source: Ambulatory Visit | Attending: Urology | Admitting: Urology

## 2019-10-20 DIAGNOSIS — Z79899 Other long term (current) drug therapy: Secondary | ICD-10-CM | POA: Insufficient documentation

## 2019-10-20 DIAGNOSIS — Z01818 Encounter for other preprocedural examination: Secondary | ICD-10-CM | POA: Insufficient documentation

## 2019-10-20 DIAGNOSIS — Z955 Presence of coronary angioplasty implant and graft: Secondary | ICD-10-CM | POA: Diagnosis not present

## 2019-10-20 DIAGNOSIS — Z87891 Personal history of nicotine dependence: Secondary | ICD-10-CM | POA: Insufficient documentation

## 2019-10-20 DIAGNOSIS — F419 Anxiety disorder, unspecified: Secondary | ICD-10-CM | POA: Diagnosis not present

## 2019-10-20 DIAGNOSIS — Z20828 Contact with and (suspected) exposure to other viral communicable diseases: Secondary | ICD-10-CM | POA: Insufficient documentation

## 2019-10-20 DIAGNOSIS — I252 Old myocardial infarction: Secondary | ICD-10-CM | POA: Insufficient documentation

## 2019-10-20 DIAGNOSIS — I119 Hypertensive heart disease without heart failure: Secondary | ICD-10-CM | POA: Insufficient documentation

## 2019-10-20 DIAGNOSIS — I2510 Atherosclerotic heart disease of native coronary artery without angina pectoris: Secondary | ICD-10-CM | POA: Insufficient documentation

## 2019-10-20 DIAGNOSIS — E785 Hyperlipidemia, unspecified: Secondary | ICD-10-CM | POA: Diagnosis not present

## 2019-10-20 DIAGNOSIS — Z7901 Long term (current) use of anticoagulants: Secondary | ICD-10-CM | POA: Diagnosis not present

## 2019-10-20 DIAGNOSIS — Z7982 Long term (current) use of aspirin: Secondary | ICD-10-CM | POA: Diagnosis not present

## 2019-10-20 DIAGNOSIS — N201 Calculus of ureter: Secondary | ICD-10-CM | POA: Insufficient documentation

## 2019-10-20 DIAGNOSIS — I272 Pulmonary hypertension, unspecified: Secondary | ICD-10-CM | POA: Insufficient documentation

## 2019-10-20 DIAGNOSIS — F329 Major depressive disorder, single episode, unspecified: Secondary | ICD-10-CM | POA: Insufficient documentation

## 2019-10-20 DIAGNOSIS — Z87442 Personal history of urinary calculi: Secondary | ICD-10-CM | POA: Insufficient documentation

## 2019-10-20 DIAGNOSIS — N202 Calculus of kidney with calculus of ureter: Secondary | ICD-10-CM | POA: Diagnosis not present

## 2019-10-20 DIAGNOSIS — H409 Unspecified glaucoma: Secondary | ICD-10-CM | POA: Diagnosis not present

## 2019-10-20 LAB — CBC
HCT: 46.9 % (ref 39.0–52.0)
Hemoglobin: 14.6 g/dL (ref 13.0–17.0)
MCH: 29.1 pg (ref 26.0–34.0)
MCHC: 31.1 g/dL (ref 30.0–36.0)
MCV: 93.4 fL (ref 80.0–100.0)
Platelets: 284 10*3/uL (ref 150–400)
RBC: 5.02 MIL/uL (ref 4.22–5.81)
RDW: 13.5 % (ref 11.5–15.5)
WBC: 11.8 10*3/uL — ABNORMAL HIGH (ref 4.0–10.5)
nRBC: 0 % (ref 0.0–0.2)

## 2019-10-20 LAB — BASIC METABOLIC PANEL
Anion gap: 8 (ref 5–15)
BUN: 36 mg/dL — ABNORMAL HIGH (ref 8–23)
CO2: 26 mmol/L (ref 22–32)
Calcium: 9 mg/dL (ref 8.9–10.3)
Chloride: 102 mmol/L (ref 98–111)
Creatinine, Ser: 2.36 mg/dL — ABNORMAL HIGH (ref 0.61–1.24)
GFR calc Af Amer: 31 mL/min — ABNORMAL LOW (ref >60)
GFR calc non Af Amer: 27 mL/min — ABNORMAL LOW (ref >60)
Glucose, Bld: 149 mg/dL — ABNORMAL HIGH (ref 70–99)
Potassium: 4.7 mmol/L (ref 3.5–5.1)
Sodium: 136 mmol/L (ref 135–145)

## 2019-10-20 LAB — SARS CORONAVIRUS 2 (TAT 6-24 HRS): SARS Coronavirus 2: NEGATIVE

## 2019-10-20 NOTE — Progress Notes (Addendum)
Anesthesia Chart Review   Case: [id number] Date/Time: 10/22/19 1230   Procedure: CYSTOSCOPY/URETEROSCOPY/HOLMIUM LASER/STENT PLACEMENT (Left )   Anesthesia type: General   Pre-op diagnosis: LEFT URETERAL STONE   Location: [facility]   Surgeon: Ardis Hughs, MD      DISCUSSION:68 y.o. former smoker with h/o HTN, HLD, CAD, MI s/p PCI 01/2019, GERD, sleep apnea, left ureteral stone scheduled for above procedure 10/22/2019 with Dr. Louis Meckel.   Cardiac clearance requested.  Last OV note, echo, stress test requested.  He is seen by Dr. Orpah Greek in [place], [place].  Per Dr. Carlton Adam scheduler cardiology office reports he is cleared for procedure, fax pending.    Discussed with Dr. Sabra Heck.  I will continue to check for cardiology clearance and test results.    Addendum 10/22/2019 9:23 AM:   Clearance received from cardiologist, Dr. Orpah Greek, which states pt may hold effient 1-5 days but must continue ASA 81mg  daily.  Most recent echo and stress test were not faxed.  I have called again this morning to get these test results faxed over.    Addendum:  Test results received:  Lexiscan 09/29/2019 Low risk scan Echo EF 60-65%, mild tricuspid regurgitation, mild pulmonary HTN, stage 3 diastolic dysfunction  VS: BP (!) 157/66   Pulse 79   Temp 36.8 C (Oral)   Resp 18   Ht 5\' 11"  (1.803 m)   Wt (!) 151.3 kg   SpO2 95%   BMI 46.51 kg/m   PROVIDERS: Moshe Cipro, MD is PCP   Orpah Greek, MD is Cardiologist  LABS: Labs reviewed: Acceptable for surgery. (all labs ordered are listed, but only abnormal results are displayed)  Labs Reviewed  CBC - Abnormal; Notable for the following components:      Result Value   WBC 11.8 (*)    All other components within normal limits  BASIC METABOLIC PANEL     IMAGES:   EKG: 10/20/2019 Rate 71 bpm Normal sinus rhythm Possible Left atrial enlargement Left axis deviation Inferior infarct , age  undetermined Anterior infarct , age undetermined Abnormal ECG When compared with ECG of 17-Jun-2015 No significant change was found  CV: Lexiscan 09/29/2019 Impression:  The perfusion study is normal.  No perfusion defects seen on imaging The patient experienced no pharmacologically induced symptoms No stress induced arrhythmias Physiologic blood pressure response No infusion induced ECG changes SPECT nuclear imaging demonstrates no evidence of ischemia Anterolateral infarct Mild anterolateral hypokinesia   Low risk scan for medical management   Echo 09/28/2019 Conclusions:  Normal left ventricular systolic function.  LVEF 60-65% Normal right ventricular function Moderate left ventricular hypertrophy Stage 3 diastolic dysfunction and restrictive left ventricular diastolic function  Cardiac chamber imaging demonstrates mild pulmonic insufficiency, and mild tricuspid regurgitation with doppler evidence of mild pulmonary hypertension Mildly elevated estimated PA systolic pressure No pericardial effusion  Mild aortic root dilatation  Echo 06/12/2015 Study Conclusions  - Left ventricle: Technically difficult study. The cavity size was   normal. Wall thickness was increased in a pattern of moderate   LVH. The estimated ejection fraction was 60%. Regional wall   motion abnormalities cannot be excluded. - Aortic valve: Sclerosis without stenosis. There was no   significant regurgitation. - Right ventricle: The cavity size was normal. Systolic function   was normal. Past Medical History:  Diagnosis Date  . Anxiety   . Barrett's esophagus   . CAD (coronary artery disease)    PCI, stent 2011  .  Cardiac arrhythmia   . Chicken pox   . Depression   . Diverticulosis   . Gastric ulcer   . GERD (gastroesophageal reflux disease)   . Glaucoma   . Heart attack ([place]) 01/29/2019  . History of myocardial infarction   . Hyperlipidemia   . Hypertension   . Kidney stones   . Kidney  stones   . Pneumonia 11/2014  . Prostatitis   . Reflux   . Sleep apnea   . UTI (lower urinary tract infection)     Past Surgical History:  Procedure Laterality Date  . cardiac stents     last one 01/29/2019  . ESOPHAGEAL MANOMETRY N/A 09/11/2017   Procedure: ESOPHAGEAL MANOMETRY (EM);  Surgeon: Mauri Pole, MD;  [facility];  Service: Endoscopy;  Laterality: N/A;  . EYE SURGERY Left    x 4  . EYE SURGERY Right    x 3    MEDICATIONS: . acetaminophen (TYLENOL) 500 MG tablet  . amLODipine (NORVASC) 5 MG tablet  . aspirin EC 81 MG tablet  . buPROPion (WELLBUTRIN XL) 300 MG 24 hr tablet  . Carboxymethylcellul-Glycerin (LUBRICATING EYE DROPS OP)  . ciprofloxacin (CIPRO) 500 MG tablet  . clonazePAM (KLONOPIN) 0.5 MG tablet  . DM-APAP-CPM (CORICIDIN HBP PO)  . furosemide (LASIX) 20 MG tablet  . latanoprost (XALATAN) 0.005 % ophthalmic solution  . linaclotide (LINZESS) 72 MCG capsule  . losartan (COZAAR) 50 MG tablet  . metoprolol succinate (TOPROL-XL) 25 MG 24 hr tablet  . metroNIDAZOLE (FLAGYL) 500 MG tablet  . nitroGLYCERIN (NITROSTAT) 0.4 MG SL tablet  . Omega-3 Fatty Acids (FISH OIL) 1000 MG CAPS  . ondansetron (ZOFRAN ODT) 4 MG disintegrating tablet  . prasugrel (EFFIENT) 10 MG TABS tablet  . rosuvastatin (CRESTOR) 40 MG tablet  . sucralfate (CARAFATE) 1 g tablet   No current facility-administered medications for this encounter.     Maia Plan [facility] [phone] 10/21/19  4:14 PM

## 2019-10-20 NOTE — Progress Notes (Addendum)
PCP - Moshe Cipro MD Cardiologist -   Chest x-ray -  EKG - 10/20/2019 Stress Test -  2020 requested ECHO - 2020 requested Cardiac Cath - 01/29/2019  Sleep Study - 2019 requested CPAP -  Does not wear  Fasting Blood Sugar -  Checks Blood Sugar _____ times a day  Blood Thinner Instructions: Effient (Prasugrel) & Aspirin 81 mg Aspirin Instructions:  Last Dose: 10/19/2019 Will call Dr. Sabra Heck for further instructions as to when to stop.   Anesthesia review: Chart given to Konrad Felix PA for review. Pt needs cardiac clearance. Please see BMP results. Thank you  Spoke with Pam at [facility], she has requested clearance from Dr. Orpah Greek. [name] has also requested LOV and all test associated with it ( Stress test, Echo, EKG or anything else)  HX of: NSTEMI in 2008, 2018, 2020 (stents X 3) Cardiac arrhythmia, CAD, HTN, Acute Pulmonary edema, Sleep apnea  Patient denies shortness of breath, fever, cough and chest pain at PAT appointment   Patient verbalized understanding of instructions that were given to them at the PAT appointment. Patient was also instructed that they will need to review over the PAT instructions again at home before surgery.

## 2019-10-20 NOTE — Patient Instructions (Addendum)
DUE TO COVID-19 ONLY ONE VISITOR IS ALLOWED TO COME WITH YOU AND STAY IN THE WAITING ROOM ONLY DURING PRE OP AND PROCEDURE DAY OF SURGERY. THE 1 VISITOR MAY VISIT WITH YOU AFTER SURGERY IN YOUR PRIVATE ROOM DURING VISITING HOURS ONLY!  YOU NEED TO HAVE A COVID 19 TEST ON______ @_______ , THIS TEST MUST BE DONE BEFORE SURGERY, COME  [place], [place] , [address].  ([place]) ONCE YOUR COVID TEST IS COMPLETED, PLEASE BEGIN THE QUARANTINE INSTRUCTIONS AS OUTLINED IN YOUR HANDOUT.                Sergio Griffin    Your procedure is scheduled on: Thursday 10/22/2019   Report to [facility] Main  Entrance    Report to admitting at 10:45 AM     Call this number if you have problems the morning of surgery [phone]    Remember: Do not eat food after Midnight.    BRUSH YOUR TEETH MORNING OF SURGERY AND RINSE YOUR MOUTH OUT, NO CHEWING GUM CANDY OR MINTS.     Take these medicines the morning of surgery with A SIP OF WATER: Metoprolol Succinate (Toprol -XL), Lubricating eye drops if needed                                 You may not have any metal on your body including hair pins and              piercings  Do not wear jewelry, make-up, lotions, powders or cologne, deodorant                          Men may shave face and neck.   Do not bring valuables to the hospital. [place].  Contacts, dentures or bridgework may not be worn into surgery.  Leave suitcase in the car. After surgery it may be brought to your room.     Patients discharged the day of surgery will not be allowed to drive home. IF YOU ARE HAVING SURGERY AND GOING HOME THE SAME DAY, YOU MUST HAVE AN ADULT TO DRIVE YOU HOME AND  BE WITH YOU FOR 24 HOURS. YOU MAY GO HOME BY TAXI OR UBER OR ORTHERWISE, BUT AN ADULT MUST ACCOMPANY YOU HOME AND STAY WITH YOU FOR 24 HOURS.    Name and phone number of your driver: Brother Iktan Boyce  [phone]                Please read over the following fact sheets you were given: _____________________________________________________________________             [facility] - Preparing for Surgery Before surgery, you can play an important role.  Because skin is not sterile, your skin needs to be as free of germs as possible.  You can reduce the number of germs on your skin by washing with CHG (chlorahexidine gluconate) soap before surgery.  CHG is an antiseptic cleaner which kills germs and bonds with the skin to continue killing germs even after washing. Please DO NOT use if you have an allergy to CHG or antibacterial soaps.  If your skin becomes reddened/irritated stop using the CHG and inform your nurse when you arrive at Short Stay. Do not shave (including legs  and underarms) for at least 48 hours prior to the first CHG shower.  You may shave your face/neck. Please follow these instructions carefully:  1.  Shower with CHG Soap the night before surgery and the  morning of Surgery.  2.  If you choose to wash your hair, wash your hair first as usual with your  normal  shampoo.  3.  After you shampoo, rinse your hair and body thoroughly to remove the  shampoo.                             4.  Use CHG as you would any other liquid soap.  You can apply chg directly  to the skin and wash                       Gently with a scrungie or clean washcloth.  5.  Apply the CHG Soap to your body ONLY FROM THE NECK DOWN.   Do not use on face/ open                           Wound or open sores. Avoid contact with eyes, ears mouth and genitals (private parts).                       Wash face,  Genitals (private parts) with your normal soap.             6.  Wash thoroughly, paying special attention to the area where your surgery  will be performed.  7.  Thoroughly rinse your body with warm water from the neck down.  8.  DO NOT shower/wash with your normal soap after using and rinsing off  the CHG  Soap.                9.  Pat yourself dry with a clean towel.            10.  Wear clean pajamas.            11.  Place clean sheets on your bed the night of your first shower and do not  sleep with pets. Day of Surgery : Do not apply any lotions/deodorants the morning of surgery.  Please wear clean clothes to the hospital/surgery center.  FAILURE TO FOLLOW THESE INSTRUCTIONS MAY RESULT IN THE CANCELLATION OF YOUR SURGERY PATIENT SIGNATURE_________________________________  NURSE SIGNATURE__________________________________  ________________________________________________________________________

## 2019-10-21 ENCOUNTER — Other Ambulatory Visit (HOSPITAL_COMMUNITY): Payer: Medicare Other

## 2019-10-21 NOTE — Anesthesia Preprocedure Evaluation (Addendum)
Anesthesia Evaluation  Patient identified by MRN, date of birth, ID band Patient awake    Reviewed: Allergy & Precautions, NPO status , Patient's Chart, lab work & pertinent test results  Airway Mallampati: III  TM Distance: >3 FB Neck ROM: Full    Dental  (+) Teeth Intact, Dental Advisory Given   Pulmonary former smoker,    breath sounds clear to auscultation       Cardiovascular hypertension,  Rhythm:Regular Rate:Normal     Neuro/Psych    GI/Hepatic   Endo/Other    Renal/GU      Musculoskeletal   Abdominal (+) + obese,   Peds  Hematology   Anesthesia Other Findings   Reproductive/Obstetrics                            Anesthesia Physical Anesthesia Plan  ASA: III  Anesthesia Plan: General   Post-op Pain Management:    Induction: Intravenous, Rapid sequence and Cricoid pressure planned  PONV Risk Score and Plan: Ondansetron  Airway Management Planned: Oral ETT and Video Laryngoscope Planned  Additional Equipment:   Intra-op Plan:   Post-operative Plan: Extubation in OR  Informed Consent: I have reviewed the patients History and Physical, chart, labs and discussed the procedure including the risks, benefits and alternatives for the proposed anesthesia with the patient or authorized representative who has indicated his/her understanding and acceptance.     Dental advisory given  Plan Discussed with: CRNA and Anesthesiologist  Anesthesia Plan Comments: (See PAT note 10/20/2019, Konrad Felix, PA-C)       Anesthesia Quick Evaluation

## 2019-10-22 ENCOUNTER — Ambulatory Visit (HOSPITAL_COMMUNITY)
Admission: RE | Admit: 2019-10-22 | Discharge: 2019-10-22 | Disposition: A | Discharge status: Home / Self Care | Payer: Medicare Other | Attending: Urology | Admitting: Urology

## 2019-10-22 ENCOUNTER — Ambulatory Visit (HOSPITAL_COMMUNITY): Payer: Medicare Other | Admitting: Physician Assistant

## 2019-10-22 ENCOUNTER — Ambulatory Visit (HOSPITAL_COMMUNITY): Payer: Medicare Other

## 2019-10-22 ENCOUNTER — Encounter (HOSPITAL_COMMUNITY)
Admission: RE | Disposition: A | Discharge status: Home / Self Care | Payer: Self-pay | Source: Home / Self Care | Attending: Urology

## 2019-10-22 ENCOUNTER — Encounter (HOSPITAL_COMMUNITY): Payer: Self-pay | Admitting: *Deleted

## 2019-10-22 DIAGNOSIS — N200 Calculus of kidney: Secondary | ICD-10-CM

## 2019-10-22 DIAGNOSIS — Z79899 Other long term (current) drug therapy: Secondary | ICD-10-CM | POA: Insufficient documentation

## 2019-10-22 DIAGNOSIS — E7800 Pure hypercholesterolemia, unspecified: Secondary | ICD-10-CM | POA: Insufficient documentation

## 2019-10-22 DIAGNOSIS — I509 Heart failure, unspecified: Secondary | ICD-10-CM | POA: Insufficient documentation

## 2019-10-22 DIAGNOSIS — F329 Major depressive disorder, single episode, unspecified: Secondary | ICD-10-CM | POA: Insufficient documentation

## 2019-10-22 DIAGNOSIS — Z7902 Long term (current) use of antithrombotics/antiplatelets: Secondary | ICD-10-CM | POA: Insufficient documentation

## 2019-10-22 DIAGNOSIS — I110 Hypertensive heart disease with heart failure: Secondary | ICD-10-CM | POA: Insufficient documentation

## 2019-10-22 DIAGNOSIS — N132 Hydronephrosis with renal and ureteral calculous obstruction: Secondary | ICD-10-CM | POA: Diagnosis not present

## 2019-10-22 DIAGNOSIS — Z955 Presence of coronary angioplasty implant and graft: Secondary | ICD-10-CM | POA: Insufficient documentation

## 2019-10-22 DIAGNOSIS — F419 Anxiety disorder, unspecified: Secondary | ICD-10-CM | POA: Insufficient documentation

## 2019-10-22 DIAGNOSIS — N201 Calculus of ureter: Secondary | ICD-10-CM | POA: Diagnosis not present

## 2019-10-22 DIAGNOSIS — Z87891 Personal history of nicotine dependence: Secondary | ICD-10-CM | POA: Diagnosis not present

## 2019-10-22 DIAGNOSIS — K219 Gastro-esophageal reflux disease without esophagitis: Secondary | ICD-10-CM | POA: Insufficient documentation

## 2019-10-22 DIAGNOSIS — N189 Chronic kidney disease, unspecified: Secondary | ICD-10-CM | POA: Diagnosis not present

## 2019-10-22 DIAGNOSIS — Z7982 Long term (current) use of aspirin: Secondary | ICD-10-CM | POA: Insufficient documentation

## 2019-10-22 DIAGNOSIS — N179 Acute kidney failure, unspecified: Secondary | ICD-10-CM | POA: Diagnosis not present

## 2019-10-22 DIAGNOSIS — I129 Hypertensive chronic kidney disease with stage 1 through stage 4 chronic kidney disease, or unspecified chronic kidney disease: Secondary | ICD-10-CM | POA: Diagnosis not present

## 2019-10-22 HISTORY — PX: CYSTOSCOPY/URETEROSCOPY/HOLMIUM LASER/STENT PLACEMENT: SHX6546

## 2019-10-22 SURGERY — CYSTOSCOPY/URETEROSCOPY/HOLMIUM LASER/STENT PLACEMENT
Anesthesia: General | Site: Ureter | Laterality: Left

## 2019-10-22 MED ORDER — SUCCINYLCHOLINE CHLORIDE 200 MG/10ML IV SOSY
PREFILLED_SYRINGE | INTRAVENOUS | Status: DC | PRN
  Administered 2019-10-22: 120 mg via INTRAVENOUS

## 2019-10-22 MED ORDER — ONDANSETRON HCL 4 MG/2ML IJ SOLN
INTRAMUSCULAR | Status: AC
  Filled 2019-10-22: qty 2

## 2019-10-22 MED ORDER — LIDOCAINE 2% (20 MG/ML) 5 ML SYRINGE
INTRAMUSCULAR | Status: DC | PRN
  Administered 2019-10-22: 100 mg via INTRAVENOUS

## 2019-10-22 MED ORDER — PROPOFOL 10 MG/ML IV BOLUS
INTRAVENOUS | Status: DC | PRN
  Administered 2019-10-22: 200 mg via INTRAVENOUS

## 2019-10-22 MED ORDER — DEXAMETHASONE SODIUM PHOSPHATE 10 MG/ML IJ SOLN
INTRAMUSCULAR | Status: AC
  Filled 2019-10-22: qty 1

## 2019-10-22 MED ORDER — SUCCINYLCHOLINE CHLORIDE 200 MG/10ML IV SOSY
PREFILLED_SYRINGE | INTRAVENOUS | Status: AC
  Filled 2019-10-22: qty 10

## 2019-10-22 MED ORDER — OXYCODONE HCL 5 MG PO TABS: 5.0000 mg | ORAL_TABLET | Freq: Once | ORAL | Status: DC | PRN

## 2019-10-22 MED ORDER — LACTATED RINGERS IV SOLN
INTRAVENOUS | Status: DC
  Administered 2019-10-22: 1000 mL via INTRAVENOUS
  Administered 2019-10-22: 11:00:00 via INTRAVENOUS

## 2019-10-22 MED ORDER — SODIUM CHLORIDE 0.9 % IR SOLN
Status: DC | PRN
  Administered 2019-10-22: 6000 mL

## 2019-10-22 MED ORDER — PROPOFOL 10 MG/ML IV BOLUS
INTRAVENOUS | Status: AC
  Filled 2019-10-22: qty 20

## 2019-10-22 MED ORDER — FENTANYL CITRATE (PF) 100 MCG/2ML IJ SOLN
INTRAMUSCULAR | Status: AC
  Filled 2019-10-22: qty 2

## 2019-10-22 MED ORDER — PHENAZOPYRIDINE HCL 200 MG PO TABS: 200.0000 mg | ORAL_TABLET | Freq: Three times a day (TID) | ORAL | 0 refills | Status: DC | PRN

## 2019-10-22 MED ORDER — SODIUM CHLORIDE 0.9 % IR SOLN
Status: DC | PRN
  Administered 2019-10-22: 1000 mL

## 2019-10-22 MED ORDER — TRAMADOL HCL 50 MG PO TABS: 50.0000 mg | ORAL_TABLET | Freq: Four times a day (QID) | ORAL | 0 refills | Status: DC | PRN

## 2019-10-22 MED ORDER — LIDOCAINE 2% (20 MG/ML) 5 ML SYRINGE
INTRAMUSCULAR | Status: AC
  Filled 2019-10-22: qty 5

## 2019-10-22 MED ORDER — CIPROFLOXACIN HCL 500 MG PO TABS: 500.0000 mg | ORAL_TABLET | Freq: Once | ORAL | 0 refills | Status: AC

## 2019-10-22 MED ORDER — DEXTROSE 5 % IV SOLN
3.0000 g | INTRAVENOUS | Status: AC
  Administered 2019-10-22: 3 g via INTRAVENOUS
  Filled 2019-10-22: qty 3

## 2019-10-22 MED ORDER — FENTANYL CITRATE (PF) 100 MCG/2ML IJ SOLN
25.0000 ug | INTRAMUSCULAR | Status: DC | PRN
  Administered 2019-10-22 (×2): 50 ug via INTRAVENOUS

## 2019-10-22 MED ORDER — PHENYLEPHRINE 40 MCG/ML (10ML) SYRINGE FOR IV PUSH (FOR BLOOD PRESSURE SUPPORT)
PREFILLED_SYRINGE | INTRAVENOUS | Status: AC
  Filled 2019-10-22: qty 10

## 2019-10-22 MED ORDER — ONDANSETRON HCL 4 MG/2ML IJ SOLN
INTRAMUSCULAR | Status: DC | PRN
  Administered 2019-10-22: 4 mg via INTRAVENOUS

## 2019-10-22 MED ORDER — PRASUGREL HCL 10 MG PO TABS: 10.0000 mg | ORAL_TABLET | Freq: Every day | ORAL | Status: DC

## 2019-10-22 MED ORDER — OXYCODONE HCL 5 MG/5ML PO SOLN: 5.0000 mg | Freq: Once | ORAL | Status: DC | PRN

## 2019-10-22 MED ORDER — FENTANYL CITRATE (PF) 100 MCG/2ML IJ SOLN
INTRAMUSCULAR | Status: DC | PRN
  Administered 2019-10-22 (×2): 50 ug via INTRAVENOUS

## 2019-10-22 MED ORDER — IOHEXOL 300 MG/ML  SOLN
INTRAMUSCULAR | Status: DC | PRN
  Administered 2019-10-22: 50 mL

## 2019-10-22 MED ORDER — EPHEDRINE SULFATE-NACL 50-0.9 MG/10ML-% IV SOSY
PREFILLED_SYRINGE | INTRAVENOUS | Status: DC | PRN
  Administered 2019-10-22: 10 mg via INTRAVENOUS

## 2019-10-22 MED ORDER — DEXAMETHASONE SODIUM PHOSPHATE 10 MG/ML IJ SOLN
INTRAMUSCULAR | Status: DC | PRN
  Administered 2019-10-22: 4 mg via INTRAVENOUS

## 2019-10-22 SURGICAL SUPPLY — 20 items
BAG URO CATCHER STRL LF (MISCELLANEOUS) ×3 IMPLANT
BASKET ZERO TIP NITINOL 2.4FR (BASKET) ×3 IMPLANT
CATH URET 5FR 28IN OPEN ENDED (CATHETERS) ×3 IMPLANT
CLOTH BEACON ORANGE TIMEOUT ST (SAFETY) ×3 IMPLANT
EXTRACTOR STONE 1.7FRX115CM (UROLOGICAL SUPPLIES) IMPLANT
FIBER LASER TRAC TIP (UROLOGICAL SUPPLIES) ×3 IMPLANT
GLOVE BIOGEL M STRL SZ7.5 (GLOVE) ×3 IMPLANT
GOWN STRL REUS W/TWL XL LVL3 (GOWN DISPOSABLE) ×3 IMPLANT
GUIDEWIRE ANG ZIPWIRE 038X150 (WIRE) IMPLANT
GUIDEWIRE STR DUAL SENSOR (WIRE) ×6 IMPLANT
KIT TURNOVER KIT A (KITS) IMPLANT
MANIFOLD NEPTUNE II (INSTRUMENTS) ×3 IMPLANT
PACK CYSTO (CUSTOM PROCEDURE TRAY) ×3 IMPLANT
SHEATH URETERAL 12FRX28CM (UROLOGICAL SUPPLIES) IMPLANT
SHEATH URETERAL 12FRX35CM (MISCELLANEOUS) IMPLANT
STENT URET 6FRX28 CONTOUR (STENTS) ×3 IMPLANT
TUBING CONNECTING 10 (TUBING) ×2 IMPLANT
TUBING CONNECTING 10' (TUBING) ×1
TUBING UROLOGY SET (TUBING) ×3 IMPLANT
WIRE COONS/BENSON .038X145CM (WIRE) IMPLANT

## 2019-10-22 NOTE — Anesthesia Procedure Notes (Signed)
Procedure Name: Intubation Date/Time: 10/22/2019 11:56 AM Performed by: Niel Hummer, CRNA Pre-anesthesia Checklist: Patient identified, Emergency Drugs available, Suction available and Patient being monitored Patient Re-evaluated:Patient Re-evaluated prior to induction Oxygen Delivery Method: Circle system utilized Preoxygenation: Pre-oxygenation with 100% oxygen Induction Type: IV induction and Rapid sequence Laryngoscope Size: Glidescope and 4 Grade View: Grade I Tube type: Oral Tube size: 7.5 mm Number of attempts: 1 Airway Equipment and Method: Stylet and Video-laryngoscopy Placement Confirmation: ETT inserted through vocal cords under direct vision,  positive ETCO2 and breath sounds checked- equal and bilateral Secured at: 22 cm Tube secured with: Tape Dental Injury: Teeth and Oropharynx as per pre-operative assessment  Comments: Elective glidescope used. Recommend glidescope.

## 2019-10-22 NOTE — Discharge Instructions (Signed)
DISCHARGE INSTRUCTIONS FOR KIDNEY STONE/URETERAL STENT   MEDICATIONS:  1.  Resume all your other meds from home - except do not take any extra narcotic pain meds that you may have at home.  2. Pyridium is to help with the burning/stinging when you urinate. 3. Tramadol is for moderate/severe pain, otherwise taking upto 1000 mg every 6 hours of plainTylenol will help treat your pain.   4. Take Cipro one hour prior to removal of your stent.   ACTIVITY:  1. No strenuous activity x 1week  2. No driving while on narcotic pain medications  3. Drink plenty of water  4. Continue to walk at home - you can still get blood clots when you are at home, so keep active, but don't over do it.  5. May return to work/school tomorrow or when you feel ready   BATHING:  1. You can shower and we recommend daily showers  2. You have a string coming from your urethra: The stent string is attached to your ureteral stent. Do not pull on this.   SIGNS/SYMPTOMS TO CALL:  Please call us if you have a fever greater than 101.5, uncontrolled nausea/vomiting, uncontrolled pain, dizziness, unable to urinate, bloody urine, chest pain, shortness of breath, leg swelling, leg pain, redness around wound, drainage from wound, or any other concerns or questions.   You can reach [place] at [phone].   FOLLOW-UP:  1. You have an appointment in 6 weeks with a ultrasound of your kidneys prior.  2. You have a string attached to your stent, you may remove it on Monday Dec 7. To do this, pull the strings until the stents are completely removed. You may feel an odd sensation in your back.

## 2019-10-22 NOTE — Interval H&P Note (Signed)
History and Physical Interval Note:  10/22/2019 11:11 AM  Sergio Griffin  has presented today for surgery, with the diagnosis of LEFT URETERAL STONE.  The various methods of treatment have been discussed with the patient and family. After consideration of risks, benefits and other options for treatment, the patient has consented to  Procedure(s): CYSTOSCOPY/URETEROSCOPY/HOLMIUM LASER/STENT PLACEMENT (Left) as a surgical intervention.  The patient's history has been reviewed, patient examined, no change in status, stable for surgery.  I have reviewed the patient's chart and labs.  Questions were answered to the patient's satisfaction.     Ardis Hughs

## 2019-10-22 NOTE — H&P (Signed)
Acute Kidney Stone  HPI: Sergio Griffin is a 68 year-old male patient who was referred by Dr. Harl Bowie, MD who is here for further eval and management of kidney stones.  He was diagnosed with a kidney stone on approximately 10/13/2019. The patient presented to [facility] with symptoms of a kidney stone.   His pain started about approximately 10/13/2019. The pain is on the left side.   Abdomen/Pelvic CT: 10/13/19- mild left hydronephrosis with 4 mm proximal left ureter stone; left nephrolithiasis. The patient underwent CT scan prior to today's appointment.   The patient relates initially having nausea, flank pain, and voiding symptoms. He is currently having back pain and irritative voiding symptoms. He denies having flank pain, groin pain, nausea, vomiting, fever, and chills. He has not caught a stone in his urine strainer since his symptoms began.   He has never had surgical treatment for calculi in the past. This is not his first kidney stone. He has had 4 stones prior to getting this one.   CT scan also showed 6 mm right lower lobe pulmonary nodule, likely benign according to report.  Treated recently with Cipro 500 mg for possible colitis.   The patient has had intense un is unremitting pain since any port. Today the pain is predominantly in his left testicle. He has a CT scan that demonstrates a 4 mm mid ureteral stone in a larger 6 mm stone in the left lower pole.   Interesting, the patient did have a CT scan 2 weeks ago for colitis within the consistent which demonstrated nonobstructing stones in the left lower pole.   The patient has a past medical history of of heart failure, morbid obesity, and poor exercise tolerance. He also has passed numerous stones on his own in the past. The patient takes anticoagulants Effient and aspirin for his cardiac stents.     ALLERGIES: sulfa    MEDICATIONS: Metoprolol Tartrate  Amlodipine Besylate 10 mg tablet  Aspirin Ec 81 mg  tablet, delayed release  Bupropion Xl 300 mg tablet, extended release 24 hr  Clonazepam 0.5 mg tablet  Effient 10 mg tablet  Furosemide 20 mg tablet  Linzess 72 mcg capsule  Miralax  Multivitamin  Rosuvastatin Calcium 40 mg tablet  Sucralfate 1 gram tablet     GU PSH: None     PSH Notes: heart (cardiac) stent x3 (2020, 2018, 2008)   NON-GU PSH: None   GU PMH: None   NON-GU PMH: Anxiety Depression GERD Glaucoma Hypercholesterolemia Hypertension Myocardial Infarction    FAMILY HISTORY: 1 Daughter - Daughter 1 son - Son Diabetes - Brother Hypertension - Brother lymphoma - Father   SOCIAL HISTORY: Marital Status: Divorced Preferred Language: English; Ethnicity: Not Hispanic Or Latino; Race: White Current Smoking Status: Patient does not smoke anymore. Has not smoked since 10/19/1989. Smoked for 15 years. Smoked 1/2 pack per day.   Tobacco Use Assessment Completed: Used Tobacco in last 30 days? Has never drank.  Does not drink caffeine.    REVIEW OF SYSTEMS:    GU Review Male:   Patient reports frequent urination, burning/ pain with urination, get up at night to urinate, have to strain to urinate , and penile pain. Patient denies hard to postpone urination, leakage of urine, stream starts and stops, trouble starting your stream, and erection problems.  Gastrointestinal (Upper):   Patient reports nausea and indigestion/ heartburn. Patient denies vomiting.  Gastrointestinal (Lower):   Patient reports constipation. Patient denies diarrhea.  Constitutional:  Patient denies fever, night sweats, weight loss, and fatigue.  Skin:   Patient denies skin rash/ lesion and itching.  Eyes:   Patient denies blurred vision and double vision.  Ears/ Nose/ Throat:   Patient denies sore throat and sinus problems.  Hematologic/Lymphatic:   Patient denies swollen glands and easy bruising.  Cardiovascular:   Patient denies leg swelling and chest pains.  Respiratory:   Patient denies cough  and shortness of breath.  Endocrine:   Patient denies excessive thirst.  Musculoskeletal:   Patient reports back pain. Patient denies joint pain.  Neurological:   Patient denies headaches and dizziness.  Psychologic:   Patient denies depression and anxiety.   VITAL SIGNS:      10/20/2019 09:11 AM  Weight 336 lb / 152.41 kg  Height 71 in / 180.34 cm  BP 122/80 mmHg  Pulse 71 /min  Temperature 97.5 F / 36.3 C  BMI 46.9 kg/m   MULTI-SYSTEM PHYSICAL EXAMINATION:    Constitutional: Obese. No physical deformities. Normally developed. Good grooming. Patient is visibly short of breath.  Neck: Neck symmetrical, not swollen. Normal tracheal position.  Respiratory: Labored breathing. No use of accessory muscles.   Cardiovascular: Regular rate and rhythm. No murmur, no gallop. Normal temperature, normal extremity pulses, no swelling, no varicosities.   Lymphatic: No enlargement of neck, axillae, groin.  Skin: No paleness, no jaundice, no cyanosis. No lesion, no ulcer, no rash.  Neurologic / Psychiatric: Oriented to time, oriented to place, oriented to person. No depression, no anxiety, no agitation.  Gastrointestinal: No mass, no tenderness, no rigidity, non obese abdomen.  Eyes: Normal conjunctivae. Normal eyelids.  Ears, Nose, Mouth, and Throat: Left ear no scars, no lesions, no masses. Right ear no scars, no lesions, no masses. Nose no scars, no lesions, no masses. Normal hearing. Normal lips.  Musculoskeletal: Normal gait and station of head and neck.     PAST DATA REVIEWED:  Source Of History:  Patient  Records Review:   Previous Doctor Records, Previous Patient Records, POC Tool  Urine Test Review:   Urinalysis  X-Ray Review: C.T. Abdomen/Pelvis: Reviewed Films. Discussed With Patient.     PROCEDURES:         KUB - [id number]  A single view of the abdomen is obtained. Renal shadows are easily visualized bilaterally. The patient has a stone that is visible in the left lower pole. There is  also a stone in the mid left ureter consistent with the patient's stone that was seen 1 week prior. There are no additional calcifications along the expected location of either ureter bilaterally.  Gas pattern is grossly normal. No significant bony abnormalities.      Impression: It does not appears if the patient's stone has moved since his initial CT scan 1 week prior.  . Patient confirmed No Neulasta OnPro Device.           Urinalysis - 81003 Dipstick Dipstick Cont'd  Color: Amber Bilirubin: Neg  Appearance: Clear Ketones: Neg  Specific Gravity: 1.020 Blood: Neg  pH: 6.0 Protein: Trace  Glucose: Neg Urobilinogen: 0.2    Nitrites: Neg    Leukocyte Esterase: Neg    Notes:            Ketoralac 30mg  - [id number], [id number] Qty: 30 Adm. By: Cristobal Goldmann  Unit: mg Lot No [id number]  Route: IM Exp. Date 01/17/2021  Freq: None Mfgr.:   Site: Right Buttock   ASSESSMENT:      ICD-10 Details  1  GU:   Renal and ureteral calculus - N20.2    PLAN:            Medications New Meds: Ultram 50 mg tablet 1-2 tablet PO Q 6 H   #20  0 Refill(s)            Orders X-Rays: KUB          Schedule         Document Letter(s):  Created for Patient: Clinical Summary         Notes:   The patient has a stone in the left mid ureter that has not moved over the last week. Our plan is to stop his anticoagulants today and taken to the operating room on Thursday for ureteral stone extraction. He will need cardiac clearance.

## 2019-10-22 NOTE — Transfer of Care (Signed)
Immediate Anesthesia Transfer of Care Note  Patient: Sergio Griffin  Procedure(s) Performed: CYSTOSCOPY/URETEROSCOPY/HOLMIUM LASER/STENT PLACEMENT (Left Ureter)  Patient Location: PACU  Anesthesia Type:General  Level of Consciousness: awake, alert  and oriented  Airway & Oxygen Therapy: Patient Spontanous Breathing and Patient connected to face mask oxygen  Post-op Assessment: Report given to RN, Post -op Vital signs reviewed and stable and Patient moving all extremities X 4  Post vital signs: Reviewed and stable  Last Vitals:  Vitals Value Taken Time  BP 172/90 10/22/19 1316  Temp    Pulse 59 10/22/19 1317  Resp 20 10/22/19 1317  SpO2 96 % 10/22/19 1317  Vitals shown include unvalidated device data.  Last Pain:  Vitals:   10/22/19 1108  TempSrc: Oral         Complications: No apparent anesthesia complications

## 2019-10-22 NOTE — Op Note (Signed)
Preoperative diagnosis: left ureteral calculus  Postoperative diagnosis: left ureteral calculus  Procedure:  1. Cystoscopy 2. left ureteroscopy and stone removal 3. Ureteroscopic laser lithotripsy 4. left 28F x 28 ureteral stent placement  5. left retrograde pyelography with interpretation  Surgeon: Ardis Hughs, MD  Anesthesia: General  Complications: None  Intraoperative findings: #1.  Left retrograde pyelography demonstrated a filling defect within the left ureter consistent with the patient's known calculus without other abnormalities. #2.  Once the stone was removed in the patient's ureter I use the single lumen flexible ureteroscope and was able to access the stone in the patient's lower pole.  I moved the stone into the intrapolar region and fragmented into very small pieces that will hopefully pass on their own.  EBL: Minimal  Specimens: 1. left ureteral calculus, left renal stone   Disposition of specimens: [facility] for stone analysis  Indication: Sergio Griffin is a 68 y.o.   patient with a  left ureteral stone and associated left symptoms. After reviewing the management options for treatment, the patient elected to proceed with the above surgical procedure(s). We have discussed the potential benefits and risks of the procedure, side effects of the proposed treatment, the likelihood of the patient achieving the goals of the procedure, and any potential problems that might occur during the procedure or recuperation. Informed consent has been obtained.   Description of procedure:  The patient was taken to the operating room and general anesthesia was induced.  The patient was placed in the dorsal lithotomy position, prepped and draped in the usual sterile fashion, and preoperative antibiotics were administered. A preoperative time-out was performed.   Cystourethroscopy was performed.  The patient's urethra was examined and was normal.   demonstrated bilobar prostatic hypertrophy bilobar prostatic hypertrophy with a median lobe. The bladder was then systematically examined in its entirety. There was no evidence for any bladder tumors, stones, or other mucosal pathology.    Attention then turned to the left ureteral orifice and a ureteral catheter was used to intubate the ureteral orifice.  Omnipaque contrast was injected through the ureteral catheter and a retrograde pyelogram was performed with findings as dictated above.  A 0.38 sensor guidewire was then advanced up the left ureter into the renal pelvis under fluoroscopic guidance. The 6 Fr semirigid ureteroscope was then advanced into the ureter next to the guidewire and the calculus was identified.  Using a engage basket I was able to remove the ureteral stone without laser fragmentation.  I then advanced a flexible ureteroscope through the patient's ureter and up into the renal pelvis over a second wire.  Once I was up in the pelvis I remove the wire.  I irrigated out the patient's renal pelvis and encountered a stone in the lower pole.  I moved the stone into the intrapolar/pelvic region and using a 200 m laser fiber with settings of 20 Hz and 0.5 J I fragmented the stone.  It was notably a very hard stone and so I turned up the energy to 2 J and hertz to 25 and dusted the stone as best as possible.  I did remove one small fragment that was too big to pass.  I then slowly backed out the ureteroscope noting no significant ureteral trauma.  The wire was then backloaded through the cystoscope and a ureteral stent was advance over the wire using Seldinger technique.  The stent was positioned appropriately under fluoroscopic and cystoscopic guidance.  The wire was then  removed with an adequate stent curl noted in the renal pelvis as well as in the bladder.  The bladder was then emptied and the procedure ended.  The patient appeared to tolerate the procedure well and without  complications.  The patient was able to be awakened and transferred to the recovery unit in satisfactory condition.   Disposition: The tether of the stent was left on and secured to the ventral aspect of the patient's penis.  The patient has been scheduled for followup in 6 weeks with a renal ultrasound.

## 2019-10-22 NOTE — Anesthesia Postprocedure Evaluation (Signed)
Anesthesia Post Note  Patient: Sergio Griffin  Procedure(s) Performed: CYSTOSCOPY/URETEROSCOPY/HOLMIUM LASER/STENT PLACEMENT (Left Ureter)     Patient location during evaluation: PACU Anesthesia Type: General Level of consciousness: awake and alert Pain management: pain level controlled Vital Signs Assessment: post-procedure vital signs reviewed and stable Respiratory status: spontaneous breathing, nonlabored ventilation, respiratory function stable and patient connected to nasal cannula oxygen Cardiovascular status: blood pressure returned to baseline and stable Postop Assessment: no apparent nausea or vomiting Anesthetic complications: no    Last Vitals:  Vitals:   10/22/19 1415 10/22/19 1515  BP: (!) 176/87 (!) 164/71  Pulse: (!) 56 62  Resp: 11 14  Temp: 36.8 C 36.7 C  SpO2: 92% 92%    Last Pain:  Vitals:   10/22/19 1515  TempSrc:   PainSc: 4                  Lakita Sahlin COKER

## 2019-10-23 ENCOUNTER — Encounter (HOSPITAL_COMMUNITY): Payer: Self-pay | Admitting: Urology

## 2019-10-30 ENCOUNTER — Ambulatory Visit (INDEPENDENT_AMBULATORY_CARE_PROVIDER_SITE_OTHER): Payer: Medicare Other | Admitting: Psychiatry

## 2019-10-30 ENCOUNTER — Other Ambulatory Visit: Payer: Self-pay

## 2019-10-30 DIAGNOSIS — F411 Generalized anxiety disorder: Secondary | ICD-10-CM

## 2019-10-30 DIAGNOSIS — M25551 Pain in right hip: Secondary | ICD-10-CM | POA: Diagnosis not present

## 2019-10-30 DIAGNOSIS — M545 Low back pain: Secondary | ICD-10-CM | POA: Diagnosis not present

## 2019-10-30 DIAGNOSIS — S72111A Displaced fracture of greater trochanter of right femur, initial encounter for closed fracture: Secondary | ICD-10-CM | POA: Diagnosis not present

## 2019-10-30 DIAGNOSIS — M5136 Other intervertebral disc degeneration, lumbar region: Secondary | ICD-10-CM | POA: Diagnosis not present

## 2019-10-30 NOTE — Progress Notes (Signed)
[facility] Counselor/Therapist Progress Note  Patient ID: TUNIS DUNK, MRN: [id number],    Date: 10/30/2019  Time Spent: 60 minutes 12:00noon to 1:00pm  Virtual Visit with Video Note Connected with patient by a video enabled telemedicine/telehealth application or telephone, with their informed consent, and verified patient privacy and that I am speaking with the correct person using two identifiers. I discussed the limitations, risks, security and privacy concerns of performing psychotherapy and management service by telephone and the availability of in person appointments. I also discussed with the patient that there may be a patient responsible charge related to this service. The patient expressed understanding and agreed to proceed. I discussed the treatment planning with the patient. The patient was provided an opportunity to ask questions and all were answered. The patient agreed with the plan and demonstrated an understanding of the instructions. The patient was advised to call  our office if  symptoms worsen or feel they are in a crisis state and need immediate contact.   Therapist Location: [facility] Patient Location: home  Treatment Type: Individual Therapy  Reported Symptoms: anxiousness increased, depressed.  Mental Status Exam:  Appearance:   n/a    telehealth     Behavior:  Sharing  Motor:  n/a   telehealth  Speech/Language:   Normal Rate  Affect:  n/a     telehealth  Mood:  anxious and "lonely and isolated"  Thought process:  goal directed  Thought content:    WNL  Sensory/Perceptual disturbances:    WNL  Orientation:  oriented to person, place, time/date, situation, day of week, month of year and year  Attention:  Good  Concentration:  Fair  Memory:  [place] of knowledge:   Good  Insight:    Good  Judgment:   Good  Impulse Control:  Good   Risk Assessment: Danger to Self:  No Self-injurious Behavior: No Danger to Others: No Duty to  Warn:no Physical Aggression / Violence:No  Access to Firearms a concern: No  Gang Involvement:No   Subjective: Patient today reports anxiety and depression, some "holiday blues and difficulty with social isolation".  [place] again about 2 wks ago when he had "a bad stab of pain" but wasn't hurt badly but is to see orthopedist today to have x-rays because it's still bothering him.  Had kidney day surgery this past week due to kidney stones.  Interventions: Solution-Oriented/Positive Psychology and Ego-Supportive  Diagnosis:   ICD-10-CM   1. Generalized anxiety disorder  F41.1    Plan: Patient not signing tx goal updates on computer screen due to [place].  Treatment Goals: Goals remain on tx plan as patient works on strategies to [employer].  Long term goal: Develop healthy cognitive patterns and beliefs about himself and the world that lead to alleviation of depression and anxiety.  Short term goal: Learn and implement personal skills for managing stress, solving daily problems, and resolving conflicts effectively.  Strategy: Teach patient calming skills, problem solving skills, conflict resolution skills, to manage daily stressors.  PROGRESS: Patient has had multiple issues going on physically.  [place] again and to have x-rays done today as foot/leg still bothering him.  Had kidney day surgery done recently after having a lot of intense pain from kidney stones.  Has worried a lot about health concerns and processed that in session today, and reports he is feeling some better about his health problems and it helped him to vent today and feel supported in our  call.  "Am able to deal with things better right now and want to move forward."  Reports he is having some "break-through tearing" again, tearing up when I think or talk about certain things, not just sad or fearful things but also when I'm feeling very thankful."  Continues his work on goals and strategies including calming  skills to manage daily stressors better, being able to see more than 1 viewpoint on some things, efforts to be more self-accepting, not re-hashing the past, still cutting back on watching world news that is disturbing, and trying to stay in the present versus past or future, and trying to get along better with brother and understand him. Does report that he and brother are relating better to each other. Patient encouraged to keep working on these areas and will follow up again next session.  Goal review and progress/efforts noted with patient.     Next appt within 2-3 weeks.   Shanon Ace, LCSW

## 2019-11-02 ENCOUNTER — Other Ambulatory Visit: Payer: Self-pay | Admitting: Gastroenterology

## 2019-11-10 ENCOUNTER — Ambulatory Visit (INDEPENDENT_AMBULATORY_CARE_PROVIDER_SITE_OTHER): Payer: Medicare Other | Admitting: Psychiatry

## 2019-11-10 DIAGNOSIS — F411 Generalized anxiety disorder: Secondary | ICD-10-CM | POA: Diagnosis not present

## 2019-11-10 NOTE — Progress Notes (Signed)
[facility] Counselor/Therapist Progress Note  Patient ID: Sergio Griffin, MRN: [id number],    Date: 11/10/2019  Time Spent: 60 minutes  10:00am to 11:00am  Virtual Visit with Video Note Connected with patient by a video enabled telemedicine/telehealth application or telephone, with their informed consent, and verified patient privacy and that I am speaking with the correct person using two identifiers. I discussed the limitations, risks, security and privacy concerns of performing psychotherapy and management service by telephone and the availability of in person appointments. I also discussed with the patient that there may be a patient responsible charge related to this service. The patient expressed understanding and agreed to proceed. I discussed the treatment planning with the patient. The patient was provided an opportunity to ask questions and all were answered. The patient agreed with the plan and demonstrated an understanding of the instructions. The patient was advised to call  our office if  symptoms worsen or feel they are in a crisis state and need immediate contact.   Therapist Location: [facility] Patient Location: home   Treatment Type: Individual Therapy  Reported Symptoms: anxiety, depression,   Mental Status Exam:  Appearance:   Casual     Behavior:  Appropriate and Sharing  Motor:  Normal  Speech/Language:   Normal Rate  Affect:  anxious, depressed, stressed  Mood:  anxious and depressed  Thought process:  goal directed  Thought content:    WNL  Sensory/Perceptual disturbances:    WNL  Orientation:  oriented to person, place, time/date, situation, day of week, month of year and year  Attention:  Good  Concentration:  Good  Memory:  "sometimes forgetful"  Fund of knowledge:   Good  Insight:    Good  Judgment:   Good  Impulse Control:  Good   Risk Assessment: Danger to Self:  No Self-injurious Behavior: No Danger to Others: No Duty to  Warn:no Physical Aggression / Violence:No  Access to Firearms a concern: No  Gang Involvement:No   Subjective:  Patient today reporting he broke his pelvis recently in a fall and is home and getting better.  Also has a bad head cold that is improving.  Earlier tested negative for covid. Frustrated with expectations/demands of elderly mother who is 46.  Interventions: Solution-Oriented/Positive Psychology and Ego-Supportive  Diagnosis:   ICD-10-CM   1. Generalized anxiety disorder  F41.1      Plan: Patient not signing tx goal updates on computer screen due to [place].  Treatment Goals: Goals remain on tx plan as patient works on strategies to [employer].  Long term goal: Develop healthy cognitive patterns and beliefs about himself and the world that lead to alleviation of depression and anxiety.  Short term goal: Learn and implement personal skills for managing stress, solving daily problems, and resolving conflicts effectively.  Strategy: Teach patient calming skills, problem solving skills, conflict resolution skills, to manage daily stressors.  PROGRESS: Patient continues to have several health concerns with the latest being a broken pelvis.  Discouraged and a lot of health-related worries. We talked through some of his worries, depressive feelings, and loneliness in session today. Denies any SI. Son does check in on him as patient lives alone in apartment.  76 yr old mother is frustrating for him .  Also worked more intently on some of his anxious/negative/depressive thoughts, trying to interrupt them, recognizing they hurt versus help, and then replace them with more positive, reality-based, and empowering thoughts that do not support anxiety  nor depression. He does this replacement of thoughts well in session with some support, and encouraged him to try this on his own between sessions. Self-care (mental and physical) and staying in touch with son also encouraged.   Goal review and progress/efforts noted with patient.  Next appt within 2-3 weeks.   Shanon Ace, LCSW

## 2019-11-19 ENCOUNTER — Telehealth: Payer: Self-pay | Admitting: Gastroenterology

## 2019-11-19 ENCOUNTER — Other Ambulatory Visit: Payer: Self-pay

## 2019-11-19 MED ORDER — TRULANCE 3 MG PO TABS: 3.0000 mg | ORAL_TABLET | Freq: Every day | ORAL | 1 refills | Status: DC

## 2019-11-19 NOTE — Telephone Encounter (Signed)
Sent order into patient's pharmacy for Trulance and gave instructions for taking, after he does the Miralax purge. Gave instructions for MIralax purge and to take I capful BID after the purge. Patient agreed.

## 2019-11-19 NOTE — Telephone Encounter (Signed)
Please advise him to do bowel purge with Miralax and send Rx for Trulance 3mg  daily to start after bowel purge along with Miralax 1 capful BID. Thanks

## 2019-11-19 NOTE — Telephone Encounter (Signed)
Patient called and states he is constipated, probably impacted for the last 2 days. Yesterday he took;  3 Senakot tabs, 2 glycerin supp., 5 doses of Miralax. Today he has taken 2 enemas and 2 doses of Miralax, along with his daily Linzess. He has only had a few small hard balls of stool. Ellouise Newer PA is out of the office. Please advise

## 2019-11-19 NOTE — Telephone Encounter (Signed)
Pt reported that he is experiencing constipated.  He believes that he is impacted.  Please advise.

## 2019-11-25 ENCOUNTER — Telehealth: Payer: Self-pay | Admitting: *Deleted

## 2019-11-25 NOTE — Telephone Encounter (Signed)
Trulance approved 11-20-2019 thru 11-24-2020 Sent approval to be scanned in

## 2019-11-25 NOTE — Telephone Encounter (Signed)
Trulance prior auth sent to Cover My Meds today  Waiting on response

## 2019-11-26 DIAGNOSIS — Z08 Encounter for follow-up examination after completed treatment for malignant neoplasm: Secondary | ICD-10-CM | POA: Diagnosis not present

## 2019-11-26 DIAGNOSIS — L720 Epidermal cyst: Secondary | ICD-10-CM | POA: Diagnosis not present

## 2019-11-26 DIAGNOSIS — Z85828 Personal history of other malignant neoplasm of skin: Secondary | ICD-10-CM | POA: Diagnosis not present

## 2019-11-26 DIAGNOSIS — L218 Other seborrheic dermatitis: Secondary | ICD-10-CM | POA: Diagnosis not present

## 2019-12-03 DIAGNOSIS — N201 Calculus of ureter: Secondary | ICD-10-CM | POA: Diagnosis not present

## 2019-12-04 ENCOUNTER — Ambulatory Visit (INDEPENDENT_AMBULATORY_CARE_PROVIDER_SITE_OTHER): Payer: Medicare Other | Admitting: Psychiatry

## 2019-12-04 DIAGNOSIS — F411 Generalized anxiety disorder: Secondary | ICD-10-CM | POA: Diagnosis not present

## 2019-12-04 NOTE — Progress Notes (Signed)
[facility] Counselor/Therapist Progress Note  Patient ID: Sergio Griffin, MRN: [id number],    Date: 12/04/2019  Time Spent:  60 minutes   10:38am to 11:38noon   Virtual Visit with Video Note Connected with patient by a video enabled telemedicine/telehealth application or telephone, with their informed consent, and verified patient privacy and that I am speaking with the correct person using two identifiers. I discussed the limitations, risks, security and privacy concerns of performing psychotherapy and management service by telephone and the availability of in person appointments. I also discussed with the patient that there may be a patient responsible charge related to this service. The patient expressed understanding and agreed to proceed. I discussed the treatment planning with the patient. The patient was provided an opportunity to ask questions and all were answered. The patient agreed with the plan and demonstrated an understanding of the instructions. The patient was advised to call  our office if  symptoms worsen or feel they are in a crisis state and need immediate contact.   Therapist Location: [facility] Patient Location: home  Treatment Type: Individual Therapy  Reported Symptoms: anxiety, frustrated and emotionally tired due to issues with 70 yr old mother having episodes of instability  Mental Status Exam:  Appearance:   Casual     Behavior:  Appropriate, Sharing and Motivated  Motor:  Normal  Speech/Language:   Normal Rate  Affect:  anxious  Mood:  anxious  Thought process:  goal directed  Thought content:    WNL  Sensory/Perceptual disturbances:    WNL  Orientation:  oriented to person, place, time/date, situation, day of week, month of year and year  Attention:  Good  Concentration:  Good  Memory:  some forgetfulness, worse under stress  Fund of knowledge:   Good  Insight:    Good  Judgment:   Good  Impulse Control:  Good   Risk  Assessment: Danger to Self:  No Self-injurious Behavior: No Danger to Others: No Duty to Warn:no Physical Aggression / Violence:No  Access to Firearms a concern: No  Gang Involvement:No    Subjective: Patient today reports anxiety, along with stress of trying to help brother manage concerns about 70 mother's mental status and care.  Interventions: Solution-Oriented/Positive Psychology and Ego-Supportive  Diagnosis:   ICD-10-CM   1. Generalized anxiety disorder  F41.1     Plan: Patient not signing tx goal updates on computer screen due to [place].  Treatment Goals: Goals remain on tx plan as patient works on strategies to [employer].  Long term goal: Develop healthy cognitive patterns and beliefs about himself and the world that lead to alleviation of depression and anxiety.  Short term goal: Learn and implement personal skills for managing stress, solving daily problems, and resolving conflicts effectively.  Strategy: Teach patient calming skills, problem solving skills, conflict resolution skills, to manage daily stressors.  PROGRESS: Patient continues his work on goals as he is trying to set better boundaries with mother and brother, and develop improved communication and problem-solving skills. Worked with him on being more proactive versus reactive in relationships and in family communications.  Also focused on listening skills, to actively listen to hear what is being said, rather than listening just to respond. Practiced this in session today with some success.  He agreed to practice this more between sessions.  Also discussed his own self-care (mental and emotional) especially around helping with concerns of 70 yr old mother.  Boundaries were a lot of  our focus as he has difficult time doing this unless he reaches the point of being "totally frustrated".  Went back to the proactivity that we talked about earlier in session and helped him realize his need to  be proactive with his setting of boundaries and that this is also a part of his self-care.  In better shape physically per problems he was experiencing at time of last session. Goal review and progress noted.  Next appt within 2-3 weeks.  Shanon Ace, LCSW

## 2019-12-15 DIAGNOSIS — M1711 Unilateral primary osteoarthritis, right knee: Secondary | ICD-10-CM | POA: Diagnosis not present

## 2019-12-15 DIAGNOSIS — M25561 Pain in right knee: Secondary | ICD-10-CM | POA: Diagnosis not present

## 2019-12-15 DIAGNOSIS — S72111A Displaced fracture of greater trochanter of right femur, initial encounter for closed fracture: Secondary | ICD-10-CM | POA: Diagnosis not present

## 2019-12-15 DIAGNOSIS — M25551 Pain in right hip: Secondary | ICD-10-CM | POA: Diagnosis not present

## 2019-12-18 ENCOUNTER — Ambulatory Visit (INDEPENDENT_AMBULATORY_CARE_PROVIDER_SITE_OTHER): Payer: Medicare Other | Admitting: Psychiatry

## 2019-12-18 DIAGNOSIS — F411 Generalized anxiety disorder: Secondary | ICD-10-CM

## 2019-12-18 NOTE — Progress Notes (Signed)
[facility] Counselor/Therapist Progress Note  Patient ID: KADIN CAISSE, MRN: [id number],    Date: 12/18/2019  Time Spent: 60 minutes    Virtual Visit Note Connected with patient by a video enabled telemedicine/telehealth application or telephone, with their informed consent, and verified patient privacy and that I am speaking with the correct person using two identifiers. I discussed the limitations, risks, security and privacy concerns of performing psychotherapy and management service by telephone and the availability of in person appointments. I also discussed with the patient that there may be a patient responsible charge related to this service. The patient expressed understanding and agreed to proceed. I discussed the treatment planning with the patient. The patient was provided an opportunity to ask questions and all were answered. The patient agreed with the plan and demonstrated an understanding of the instructions. The patient was advised to call  our office if  symptoms worsen or feel they are in a crisis state and need immediate contact.   Therapist Location: [facility] Patient Location: home   Treatment Type: Individual Therapy   Reported Symptoms: anxiety, some depression re: losses and situation with mother; reports no further additional health concerns.  Mental Status Exam:  Appearance:   n/a  telehealth     Behavior:  Sharing, Motivated  Motor:  n/a   telehealth  Speech/Language:   Normal Rate  Affect:  n/a   telehealth  Mood:  anxious and depressed (depression has decreased some)  Thought process:  normal  Thought content:    WNL  Sensory/Perceptual disturbances:    WNL  Orientation:  oriented to person, place, time/date, situation, day of week, month of year and year  Attention:  Good  Concentration:  Good  Memory:  "sometimes forget things"  Fund of knowledge:   Good  Insight:    Good  Judgment:   Good  Impulse Control:  Good   Risk  Assessment: Danger to Self:  No Self-injurious Behavior: No Danger to Others: No Duty to Warn:no Physical Aggression / Violence:No  Access to Firearms a concern: No  Gang Involvement:No   Subjective: Patient today reporting "some ups and downs and trying to focus more on the positives".  Has lost another friend and close neighbor that died recently and shared his feelings of loss.   Interventions: Cognitive Behavioral Therapy and Solution-Oriented/Positive Psychology  Diagnosis:   ICD-10-CM   1. Generalized anxiety disorder  F41.1     Plan: Patient not signing tx goal updates on computer screen due to [place].  Treatment Goals: Goals remain on tx plan as patient works on strategies to [employer].  Long term goal: Develop healthy cognitive patterns and beliefs about himself and the world that lead to alleviation of depression and anxiety.  Short term goal: Learn and implement personal skills for managing stress, solving daily problems, and resolving conflicts effectively.  Strategy: Teach patient calming skills, problem solving skills, conflict resolution skills, to manage daily stressors.  PROGRESS: Patient worked today on some of his anxious automatic thoughts.  Depression has decreased some. He asked that I review with him some CBT strategies that we've worked with in the past.  Related these strategies to specifics issues he continues to work on.  Realizing that taking some time away from mom's situation is helping his mental status.  Worked some with the CBT and replacement of anxious/negative/depressive thoughts which seems to be helping me feel more grounded emotionally.  Goal review and progress noted with patient.  Next appt within 2-3 weeks.   Shanon Ace, LCSW

## 2019-12-22 ENCOUNTER — Ambulatory Visit (INDEPENDENT_AMBULATORY_CARE_PROVIDER_SITE_OTHER): Payer: Medicare Other | Admitting: Psychiatry

## 2019-12-22 ENCOUNTER — Encounter: Payer: Self-pay | Admitting: Psychiatry

## 2019-12-22 DIAGNOSIS — F331 Major depressive disorder, recurrent, moderate: Secondary | ICD-10-CM

## 2019-12-22 MED ORDER — BUPROPION HCL ER (XL) 300 MG PO TB24: 300.0000 mg | ORAL_TABLET | Freq: Every day | ORAL | 0 refills | Status: DC

## 2019-12-22 NOTE — Progress Notes (Signed)
Sergio Griffin [id number] 21-Jun-1951 69 y.o.  Virtual Visit via Telephone Note  I connected with pt on 12/22/19 at  1:30 PM EST by telephone and verified that I am speaking with the correct person using two identifiers.   I discussed the limitations, risks, security and privacy concerns of performing an evaluation and management service by telephone and the availability of in person appointments. I also discussed with the patient that there may be a patient responsible charge related to this service. The patient expressed understanding and agreed to proceed.   I discussed the assessment and treatment plan with the patient. The patient was provided an opportunity to ask questions and all were answered. The patient agreed with the plan and demonstrated an understanding of the instructions.   The patient was advised to call back or seek an in-person evaluation if the symptoms worsen or if the condition fails to improve as anticipated.  I provided 30 minutes of non-face-to-face time during this encounter.  The patient was located at home.  The provider was located at [place].   Thayer Headings, PMHNP   Subjective:   Patient ID:  Sergio Griffin is a 69 y.o. (DOB Oct 30, 1951) male.  Chief Complaint:  Chief Complaint  Patient presents with  . Follow-up    Depression, Anxiety, and insomnia    HPI Sergio Griffin presents for follow-up of depression, anxiety, and insomnia. "It's been wild and busy." He reports that he has had several falls and sustained a hairline hip fx. He has also had kidney stones. He describes his mood as "about the same" and notices his mood is improved for several days after talking with therapist. Reports his mood as, "not necessarily down, just existing." He reports that he has had some anxiety in response to mother's demands and family dynamics. Has found a sitter for his mother and this has relieved some of his stress and anxiety. Sleeping ok aside from  occasionally awakening due to pain. Appetite has been ok. Motivation has improved with getting ready to start therapy. Energy has been low. Concentration has improved some. Denies SI.   Typically taking 1.5-2 tabs of Klonopin daily.   He will start PT twice a week starting next week to improve his balance. Son has been getting groceries for pt.   Daughter and her husband have had COVID.   Past Psychiatric Medication Trials: Prozac- Adverse effects Wellbutrin- helpful for depression Celexa- Affective dulling Lexapro Imipramine- Helpful for anxiety, worsened glaucoma Klonopin- effective Ambien-ineffective.   Review of Systems:  Review of Systems  Gastrointestinal:       Improved constipation since starting Linzess.   Musculoskeletal: Negative for gait problem.       Continuing to recover from hip fx and notices some weakness. Plans to start PT next week.   Neurological: Positive for weakness. Negative for dizziness.  Psychiatric/Behavioral:       Please refer to HPI    Medications: I have reviewed the patient's current medications.  Current Outpatient Medications  Medication Sig Dispense Refill  . acetaminophen (TYLENOL) 500 MG tablet Take 500-1,000 mg by mouth every 6 (six) hours as needed for moderate pain or headache.    Marland Kitchen amLODipine (NORVASC) 5 MG tablet Take 5 mg by mouth daily.    Marland Kitchen aspirin EC 81 MG tablet Take 81 mg by mouth at bedtime.     . Carboxymethylcellul-Glycerin (LUBRICATING EYE DROPS OP) Place 1 drop into both eyes daily as needed (dry eyes).    Marland Kitchen  clonazePAM (KLONOPIN) 0.5 MG tablet Take 1 tablet (0.5 mg total) by mouth 2 (two) times daily as needed for anxiety. (Patient taking differently: Take 0.5 mg by mouth 2 (two) times daily. ) 180 tablet 0  . DM-APAP-CPM (CORICIDIN HBP PO) Take 1 tablet by mouth daily as needed (severe allergies).    . furosemide (LASIX) 20 MG tablet Take 20 mg by mouth daily.     Marland Kitchen latanoprost (XALATAN) 0.005 % ophthalmic solution Place  1 drop into both eyes at bedtime.    Marland Kitchen linaclotide (LINZESS) 72 MCG capsule Take 1 capsule (72 mcg total) by mouth daily before breakfast. 30 capsule 3  . losartan (COZAAR) 50 MG tablet Take 100 mg by mouth daily.     . metoprolol succinate (TOPROL-XL) 25 MG 24 hr tablet Take 12.5-25 mg by mouth See admin instructions. Take 25 mg in the morning and 12.5 mg in the evening    . metroNIDAZOLE (FLAGYL) 500 MG tablet Take 1 tablet (500 mg total) by mouth 2 (two) times daily. 10 tablet 0  . nitroGLYCERIN (NITROSTAT) 0.4 MG SL tablet Place 1 tablet (0.4 mg total) under the tongue every 5 (five) minutes x 3 doses as needed for chest pain. 30 tablet 1  . Omega-3 Fatty Acids (FISH OIL) 1000 MG CAPS Take 1,000 mg by mouth daily.    . ondansetron (ZOFRAN-ODT) 4 MG disintegrating tablet DISSOLVE 1 TABLET IN MOUTH EVERY 8 HOURS AS NEEDED FOR NAUSEA FOR VOMITING 30 tablet 0  . phenazopyridine (PYRIDIUM) 200 MG tablet Take 1 tablet (200 mg total) by mouth 3 (three) times daily as needed for pain. 10 tablet 0  . Plecanatide (TRULANCE) 3 MG TABS Take 3 mg by mouth daily. 30 tablet 1  . prasugrel (EFFIENT) 10 MG TABS tablet Take 1 tablet (10 mg total) by mouth at bedtime. Resume 48 hours after surgery 30 tablet   . rosuvastatin (CRESTOR) 40 MG tablet Take 40 mg by mouth every evening.     . sucralfate (CARAFATE) 1 g tablet Take 1 tablet (1 g total) by mouth 2 (two) times daily. As needed (Patient taking differently: Take 1 g by mouth at bedtime. ) 60 tablet 6  . traMADol (ULTRAM) 50 MG tablet Take 1-2 tablets (50-100 mg total) by mouth every 6 (six) hours as needed for moderate pain. 15 tablet 0  . buPROPion (WELLBUTRIN XL) 300 MG 24 hr tablet Take 1 tablet (300 mg total) by mouth daily. 90 tablet 0   No current facility-administered medications for this visit.    Medication Side Effects: None  Allergies:  Allergies  Allergen Reactions  . Ibuprofen Diarrhea and Nausea Only  . Propoxyphene Itching    Darvocet   . Sulfa Antibiotics Itching    Past Medical History:  Diagnosis Date  . Anxiety   . Barrett's esophagus   . CAD (coronary artery disease)    PCI, stent 2011  . Cardiac arrhythmia   . Chicken pox   . Depression   . Diverticulosis   . Gastric ulcer   . GERD (gastroesophageal reflux disease)   . Glaucoma   . Heart attack ([place]) 01/29/2019  . History of myocardial infarction   . Hyperlipidemia   . Hypertension   . Kidney stones   . Kidney stones   . Pneumonia 11/2014  . Prostatitis   . Reflux   . Sleep apnea   . UTI (lower urinary tract infection)     Family History  Problem Relation Age of Onset  .  Arthritis Mother   . Hyperlipidemia Mother   . Stroke Mother   . Hypertension Mother   . Ovarian cancer Mother   . Hyperlipidemia Father   . Heart disease Father   . Hypertension Father   . Non-Hodgkin's lymphoma Father   . Hyperlipidemia Maternal Grandmother   . Heart disease Maternal Grandmother   . Stroke Maternal Grandmother   . Hypertension Maternal Grandmother   . Diabetes Maternal Grandmother   . Hyperlipidemia Maternal Grandfather   . Heart disease Maternal Grandfather   . Hypertension Maternal Grandfather   . Heart disease Paternal Grandmother   . Heart disease Paternal Grandfather   . Stroke Paternal Grandfather   . Hypertension Paternal Grandfather   . Congestive Heart Failure Brother   . Colon cancer Neg Hx   . Esophageal cancer Neg Hx   . Pancreatic cancer Neg Hx   . Stomach cancer Neg Hx   . Liver disease Neg Hx     Social History   Socioeconomic History  . Marital status: Divorced    Spouse name: Not on file  . Number of children: 2  . Years of education: 93  . Highest education level: Not on file  Occupational History  . Occupation: Estate manager/land agent    Comment: Retired  Tobacco Use  . Smoking status: Former Smoker    Types: Cigarettes  . Smokeless tobacco: Never Used  . Tobacco comment: quit 1987  Substance and Sexual Activity  .  Alcohol use: No  . Drug use: No  . Sexual activity: Not Currently    Partners: Female  Other Topics Concern  . Not on file  Social History Narrative   Fun: Paint, cook, travel   Denies religious beliefs effecting health care.    Social Determinants of Health   Financial Resource Strain:   . Difficulty of Paying Living Expenses: Not on file  Food Insecurity:   . Worried About Charity fundraiser in the Last Year: Not on file  . Ran Out of Food in the Last Year: Not on file  Transportation Needs:   . Lack of Transportation (Medical): Not on file  . Lack of Transportation (Non-Medical): Not on file  Physical Activity:   . Days of Exercise per Week: Not on file  . Minutes of Exercise per Session: Not on file  Stress:   . Feeling of Stress : Not on file  Social Connections:   . Frequency of Communication with Friends and Family: Not on file  . Frequency of Social Gatherings with Friends and Family: Not on file  . Attends Religious Services: Not on file  . Active Member of Clubs or Organizations: Not on file  . Attends Archivist Meetings: Not on file  . Marital Status: Not on file  Intimate Partner Violence:   . Fear of Current or Ex-Partner: Not on file  . Emotionally Abused: Not on file  . Physically Abused: Not on file  . Sexually Abused: Not on file    Past Medical History, Surgical history, Social history, and Family history were reviewed and updated as appropriate.   Please see review of systems for further details on the patient's review from today.   Objective:   Physical Exam:  Wt (!) 332 lb (150.6 kg)   BMI 43.80 kg/m   Physical Exam Constitutional:      General: He is not in acute distress.    Appearance: He is well-developed.  Musculoskeletal:  General: No deformity.  Neurological:     Mental Status: He is alert and oriented to person, place, and time.     Coordination: Coordination normal.  Psychiatric:        Attention and  Perception: Attention and perception normal. He does not perceive auditory or visual hallucinations.        Mood and Affect: Affect is not labile, blunt, angry or inappropriate.        Speech: Speech normal.        Behavior: Behavior normal.        Thought Content: Thought content normal. Thought content is not paranoid or delusional. Thought content does not include homicidal or suicidal ideation. Thought content does not include homicidal or suicidal plan.        Cognition and Memory: Cognition and memory normal.        Judgment: Judgment normal.     Comments: Dysthymic mood Insight intact     Lab Review:     Component Value Date/Time   NA 136 10/20/2019 1419   K 4.7 10/20/2019 1419   CL 102 10/20/2019 1419   CO2 26 10/20/2019 1419   GLUCOSE 149 (H) 10/20/2019 1419   BUN 36 (H) 10/20/2019 1419   CREATININE 2.36 (H) 10/20/2019 1419   CALCIUM 9.0 10/20/2019 1419   PROT 6.9 10/07/2019 1055   ALBUMIN 4.1 10/07/2019 1055   AST 17 10/07/2019 1055   ALT 24 10/07/2019 1055   ALKPHOS 47 10/07/2019 1055   BILITOT 0.5 10/07/2019 1055   GFRNONAA 27 (L) 10/20/2019 1419   GFRAA 31 (L) 10/20/2019 1419       Component Value Date/Time   WBC 11.8 (H) 10/20/2019 1419   RBC 5.02 10/20/2019 1419   HGB 14.6 10/20/2019 1419   HCT 46.9 10/20/2019 1419   PLT 284 10/20/2019 1419   MCV 93.4 10/20/2019 1419   MCH 29.1 10/20/2019 1419   MCHC 31.1 10/20/2019 1419   RDW 13.5 10/20/2019 1419   LYMPHSABS 2.7 10/07/2019 1055   MONOABS 1.0 10/07/2019 1055   EOSABS 0.3 10/07/2019 1055   BASOSABS 0.2 (H) 10/07/2019 1055    No results found for: POCLITH, LITHIUM   No results found for: PHENYTOIN, PHENOBARB, VALPROATE, CBMZ   .res Assessment: Plan:   Patient reports that he would like continue medications without any changes since he feels that his mood and anxiety signs and symptoms are fairly well controlled considering current stressors and factors. Continue Wellbutrin XL 300 mg daily for  depression. Continue Klonopin for anxiety. Recommend continuing psychotherapy with Rinaldo Cloud, LCSW. Patient to follow-up in 3 months or sooner if clinically indicated. Patient advised to contact office with any questions, adverse effects, or acute worsening in signs and symptoms.  Sergio Griffin was seen today for follow-up.  Diagnoses and all orders for this visit:  Moderate episode of recurrent major depressive disorder (HCC) -     buPROPion (WELLBUTRIN XL) 300 MG 24 hr tablet; Take 1 tablet (300 mg total) by mouth daily.    Please see After Visit Summary for patient specific instructions.  Future Appointments  Date Time Provider [place]  01/11/2020  9:00 AM Shanon Ace, LCSW [facility] None  01/25/2020 12:00 PM Shanon Ace, LCSW [facility] None  02/08/2020  1:00 PM Shanon Ace, LCSW [facility] None    No orders of the defined types were placed in this encounter.     -------------------------------

## 2019-12-28 DIAGNOSIS — Z85828 Personal history of other malignant neoplasm of skin: Secondary | ICD-10-CM | POA: Diagnosis not present

## 2019-12-28 DIAGNOSIS — Z08 Encounter for follow-up examination after completed treatment for malignant neoplasm: Secondary | ICD-10-CM | POA: Diagnosis not present

## 2019-12-28 DIAGNOSIS — L720 Epidermal cyst: Secondary | ICD-10-CM | POA: Diagnosis not present

## 2019-12-30 DIAGNOSIS — L538 Other specified erythematous conditions: Secondary | ICD-10-CM | POA: Diagnosis not present

## 2019-12-30 DIAGNOSIS — L720 Epidermal cyst: Secondary | ICD-10-CM | POA: Diagnosis not present

## 2019-12-30 DIAGNOSIS — L729 Follicular cyst of the skin and subcutaneous tissue, unspecified: Secondary | ICD-10-CM | POA: Diagnosis not present

## 2019-12-30 DIAGNOSIS — R208 Other disturbances of skin sensation: Secondary | ICD-10-CM | POA: Diagnosis not present

## 2020-01-11 ENCOUNTER — Ambulatory Visit (INDEPENDENT_AMBULATORY_CARE_PROVIDER_SITE_OTHER): Payer: Medicare Other | Admitting: Psychiatry

## 2020-01-11 DIAGNOSIS — F411 Generalized anxiety disorder: Secondary | ICD-10-CM | POA: Diagnosis not present

## 2020-01-11 NOTE — Progress Notes (Deleted)
      [facility] Counselor/Therapist Progress Note  Patient ID: Sergio Griffin, MRN: [id number],    Date: 01/11/2020  Time Spent: ***   Treatment Type: {CHL AMB THERAPY TYPES:[id number]}  Reported Symptoms: ***  Mental Status Exam:  Appearance:   {PSY:22683}     Behavior:  {PSY:21022743}  Motor:  {PSY:22302}  Speech/Language:   {PSY:22685}  Affect:  {PSY:22687}  Mood:  {PSY:31886}  Thought process:  {PSY:31888}  Thought content:    {PSY:[phone]}  Sensory/Perceptual disturbances:    {[phone]}  Orientation:  {PSY:30297}  Attention:  {PSY:22877}  Concentration:  {PSY:615-051-9222}  Memory:  {PSY:743 520 7406}  Fund of knowledge:   {PSY:615-051-9222}  Insight:    {PSY:615-051-9222}  Judgment:   {PSY:615-051-9222}  Impulse Control:  {PSY:615-051-9222}   Risk Assessment: Danger to Self:  {PSY:22692} Self-injurious Behavior: {PSY:22692} Danger to Others: {PSY:22692} Duty to Warn:{PSY:311194} Physical Aggression / Violence:{PSY:21197} Access to Firearms a concern: {PSY:21197} Gang Involvement:{PSY:21197}  Subjective: ***   Interventions: {PSY:[id number]}  Diagnosis:No diagnosis found.  Plan: ***  Shanon Ace, LCSW

## 2020-01-11 NOTE — Progress Notes (Signed)
[facility] Counselor/Therapist Progress Note  Patient ID: Sergio Griffin, MRN: [id number],    Date: 01/11/2020  Time Spent:  60 minutes  9:00am to 10:00am Treatment Type: Individual Therapy  Reported Symptoms:  Anxiety, depression, frustrations  Mental Status Exam:  Appearance:   n/a  telehealth     Behavior:  Sharing  Motor:  n/a  telehealth  Speech/Language:   Normal Rate  Affect:  n/a  telehealth  Mood:  anxious and depressed  Thought process:  goal directed  Thought content:    WNL  Sensory/Perceptual disturbances:    WNL  Orientation:  oriented to person, place, time/date, situation, day of week, month of year and year  Attention:  Good  Concentration:  Good  Memory:  "some forgetfulness and stress makes it worse"  Fund of knowledge:   Good  Insight:    Good  Judgment:   Good  Impulse Control:  Good   Risk Assessment: Danger to Self:  No Self-injurious Behavior: No Danger to Others: No Duty to Warn:no Physical Aggression / Violence:No  Access to Firearms a concern: No  Gang Involvement:No    Subjective: Patient today reports anxiety, depression, but "anxiety is the stronger".  Has some health concerns including the fracture in his hip, had a small growth on back near base of neck that grew slowly first and then grew more quickly and eventually had it cut out and it is finally healing more, and also some gastrointestinal issues that are being treated by gastroenterologist." Stressful in dealing with these and situations within family that continues to be difficult. Decreased amount of time spent with mother and brother and this seems to have helped decreased stressors with them.  Interventions: Cognitive Behavioral Therapy and Solution-Oriented/Positive Psychology    Diagnosis:   ICD-10-CM   1. Generalized anxiety disorder  F41.1      Plan: Patient not signing tx goal updates on computer screen due to [name].  Treatment Goals: Goals remain on tx  plan as patient works on strategies to [employer].  Long term goal: (measurable) Develop healthy cognitive patterns and beliefs about himself and the world that lead to alleviation of depression and anxiety.  Overall goal is for patient to reach a point where he used acquired anxiety/stress/depression management skills, and reports his depression and anxiety remain below a "3 on a 10 pt scale" for at least 2 months.  (Currently reporting a "7" for anxiety and depression.)  Short term goal: Learn and implement personal skills for managing stress, solving daily problems, and resolving conflicts effectively.  Strategy: Teach patient calming skills, problem solving skills, conflict resolution skills, to manage daily stressors.  PROGRESS: Patient today states that his anxiety and depression are worsened some by circumstances personally and within his family. Health conerns do "worry" him and contribute to some volatility in his anxiety and depression.  Today he rates himself as being at a "7" on a 1-10 scale of anxiety and depression and notes later in session that his anxiety has been the stronger over past couple of weeks. As he speaks, his anxiety and worry is obvious.  Worked more today on his anxious thoughts and how he reacts when they are triggered.  Used some examples he provided and we focused on his being able to recognize the triggering thoughts more quickly and then replace them with more positive, reality-based, hopeful, and empowering thought patterns that do not support his anxiety nor depression. Has limited contact with his mother  some more recently and it seem to have decreased stress in the encounters with her. Encouraged his self care to include good nutrition, getting out of condo at least daily, and to practice his anxious/depressive thought interruption and changing, as noted above. Goal review and progress/challenges noted with patient.    Next appt within 2-3  weeks.   Shanon Ace, LCSW

## 2020-01-23 ENCOUNTER — Other Ambulatory Visit: Payer: Self-pay | Admitting: Gastroenterology

## 2020-01-25 ENCOUNTER — Ambulatory Visit (INDEPENDENT_AMBULATORY_CARE_PROVIDER_SITE_OTHER): Payer: Medicare Other | Admitting: Psychiatry

## 2020-01-25 DIAGNOSIS — F411 Generalized anxiety disorder: Secondary | ICD-10-CM | POA: Diagnosis not present

## 2020-01-25 NOTE — Progress Notes (Signed)
[facility] Counselor/Therapist Progress Note  Patient ID: CHYLER FOUT, MRN: [id number],    Date: 01/25/2020  Time Spent: 60 minutes 12:00noon to 1:00pm  Virtual Visit Note Connected with patient by a video enabled telemedicine/telehealth application or telephone, with their informed consent, and verified patient privacy and that I am speaking with the correct person using two identifiers. I discussed the limitations, risks, security and privacy concerns of performing psychotherapy and management service by telephone and the availability of in person appointments. I also discussed with the patient that there may be a patient responsible charge related to this service. The patient expressed understanding and agreed to proceed. I discussed the treatment planning with the patient. The patient was provided an opportunity to ask questions and all were answered. The patient agreed with the plan and demonstrated an understanding of the instructions. The patient was advised to call  our office if  symptoms worsen or feel they are in a crisis state and need immediate contact.   Therapist Location: [facility] Patient Location: home  Treatment Type: Individual Therapy  Reported Symptoms: anxiety, depression, Physically "having some hip, shoulder, and intestinal problems" and is following up with his doctors close to his home.  Mental Status Exam:  Appearance:   n/a  telehealth     Behavior:  Sharing  Motor:  n/a  telehealth  Speech/Language:   Normal Rate  Affect:  n/a  telehealth  Mood:  angry, anxious and depressed  Thought process:  goal directed  Thought content:    WNL  Sensory/Perceptual disturbances:    WNL  Orientation:  oriented to person, place, time/date, situation, day of week, month of year and year  Attention:  Good  Concentration:  Good  Memory:  some forgetfulness  Fund of knowledge:   Good  Insight:    Good  Judgment:   Good  Impulse Control:  Good    Risk Assessment: Danger to Self:  No Self-injurious Behavior: No Danger to Others: No Duty to Warn:no Physical Aggression / Violence:No  Access to Firearms a concern: No  Gang Involvement:No   Subjective: Patient today reporting anxiety and depression, and having some issues where he lives as well in communication with others at times. Feeling some anger after a conversation this a.m. that was somewhat conflictual.   Interventions: Cognitive Behavioral Therapy and Ego-Supportive  Diagnosis:   ICD-10-CM   1. Generalized anxiety disorder  F41.1     Plan: Patient not signing tx goal updates on computer screen due to [place].  Treatment Goals: Goals remain on tx plan as patient works on strategies to [employer]. Progress noted each session and documented in "Progress" section of tx plan.  Long term goal: (measurable) Develop healthy cognitive patterns and beliefs about himself and the world that lead to alleviation of depression and anxiety.  Overall goal is for patient to reach a point where he used acquired anxiety/stress/depression management skills, and reports his depression and anxiety remain below a "3 on a 10 pt scale" for at least 2 months.  (Currently reporting a "7" for anxiety and depression.)  Short term goal: Learn and implement personal skills for managing stress, solving daily problems, and resolving conflicts effectively.  Strategy: Teach patient calming skills, problem solving skills, conflict resolution skills, to manage daily stressors.  PROGRESS: Patient reporting anxiety, frustration, anger, and depression.  Also some physical issues with hip, shoulder, and intestinal issues for which he is being treated. Family concerns had been some better  recently but now have worsened some again between the caregiver and his elderly mother.  Patient reports his anxiety and depression have been aggravated some by the circumstances within his family.  Also seems  to have some mixed feelings about his 69th birthday coming up, "some good and some cautious" and apprehensive. Today her reports rates his anxiety as a "6-7" (after talking through several situations that were very stressful for him). He rated his depression as a "5". Worked more today on identifying his anxious/depressive thoughts more quickly in order to interrupt and replace them with more positive, reality-based, and empowering thought patterns that do not lead to anxiety nor depression. Has improved some on setting better boundaries with others including family as needed.  Goal review and progress/challenges/efforts noted with patient.  Next appt within 2 weeks.   Shanon Ace, LCSW

## 2020-02-08 ENCOUNTER — Ambulatory Visit (INDEPENDENT_AMBULATORY_CARE_PROVIDER_SITE_OTHER): Payer: Medicare Other | Admitting: Psychiatry

## 2020-02-08 DIAGNOSIS — F411 Generalized anxiety disorder: Secondary | ICD-10-CM

## 2020-02-08 NOTE — Progress Notes (Signed)
[facility] Counselor/Therapist Progress Note  Patient ID: Sergio Griffin, MRN: [id number],    Date: 02/08/2020  Time Spent: 60 minutes  1:00pm to 2:00pm  Virtual Visit Note Connected with patient by a video enabled telemedicine/telehealth application or telephone, with their informed consent, and verified patient privacy and that I am speaking with the correct person using two identifiers. I discussed the limitations, risks, security and privacy concerns of performing psychotherapy and management service by telephone and the availability of in person appointments. I also discussed with the patient that there may be a patient responsible charge related to this service. The patient expressed understanding and agreed to proceed. I discussed the treatment planning with the patient. The patient was provided an opportunity to ask questions and all were answered. The patient agreed with the plan and demonstrated an understanding of the instructions. The patient was advised to call  our office if  symptoms worsen or feel they are in a crisis state and need immediate contact.   Therapist Location: [facility] Patient Location: home   Treatment Type: Individual Therapy,  Reported Symptoms: "anxiety, a lot of anxiety", depression, negative thinking/assumptions  Mental Status Exam:  Appearance:   n/a  telehealth     Behavior:  Sharing  Motor:  n/a  telehealth  Speech/Language:   Normal Rate  Affect:  n/a  telehealth  Mood:  anxious and depressed  Thought process:  goal directed  Thought content:    WNL  Sensory/Perceptual disturbances:    WNL  Orientation:  oriented to person, place, time/date, situation, day of week, month of year and year  Attention:  Good  Concentration:  Good  Memory:  some forgetfulness  Fund of knowledge:   Good  Insight:    Good  Judgment:   Good  Impulse Control:  Good   Risk Assessment: Danger to Self:  No Self-injurious Behavior: No Danger  to Others: No Duty to Warn:no Physical Aggression / Violence:No  Access to Firearms a concern: No  Gang Involvement:No   Subjective:  atient in today with increased anxiety, some depression, tending to look for what might go wrong versus right. Reports not getting out as much, and the facial mask requirements make it more difficult as times. Tends to do "drive up shopping for getting things he needs."  Anxiety and depression worse upon learning of the future sale of his apt complex.    Interventions: Cognitive Behavioral Therapy and Ego-Supportive  Diagnosis:   ICD-10-CM   1. Generalized anxiety disorder  F41.1      Plan: Patient not signing tx goal updates on computer screen due to [place].  Treatment Goals: Goals remain on tx plan as patient works on strategies to [employer]. Progress noted each session and documented in "Progress" section of tx plan.  Long term goal:(measurable) Develop healthy cognitive patterns and beliefs about himself and the world that lead to alleviation of depression and anxiety. Overall goal is for patient to reach a point where he used acquired anxiety/stress/depression management skills, and reports his depression and anxiety remain below a "3 on a 10 pt scale" for at least 2 months. (Currently reporting a "7" for anxiety and depression.)  Short term goal: Learn and implement personal skills for managing stress, solving daily problems, and resolving conflicts effectively.  Strategy: Teach patient calming skills, problem solving skills, conflict resolution skills, to manage daily stressors.  PROGRESS: Patient report increased anxiety, depression, and negative thinking.  Oversleeping at times.  Denies  any SI.  Having some physical issues still (gastrointestinal, hip, shoulder) and is trying to watch his diet more.  He has an upcoming appt with his PCP and then a gastroenterologist. Has just  Unexpectedly learned that his apartment complex is  being sold and patient is really consumed with some negative thoughts/fears/assumptions that the situation will "go downhill and be much worse." He vented freely about his concerns and assumptions, and later was able to engage him to see realistically there are some unknowns but it seems that nobody really knows right now if it will be a bad change or possibly remain the same or potentially better.  He did seem to hear that message although may have difficulty in holding onto it. Discussed how the negative thinking impacts him verus a more balanced view where at least he considers that things may turn out ok.  Encouraged to be in touch with family or others each day and to get out of his apt some each day. Denies any SI.  Today he report anxiety being "at least an 8 on a 10 pt scale."  Rated himself as a "6 for depression on 1-10 point scale."  Encouraged him to continue working on recognizing and interrupting anxious/depressive/negative thoughts, replacing them with more positive, empowering, hopeful, and reality-based thoughts that do not support anxiety nor depression .   Goal review and progress noted wit patient.  Next appt within 2 weeks.   Shanon Ace, LCSW

## 2020-02-15 ENCOUNTER — Telehealth: Payer: Self-pay | Admitting: Physician Assistant

## 2020-02-15 NOTE — Telephone Encounter (Signed)
The pt has a history of constipation and takes trulance.  He states it is not working as well as it did when he began taking it.  He feels as if he does not empty when he has a BM.  No bleeding but he does have some cramping.  An appt was made for him to see Dr Silverio Decamp for follow up.

## 2020-02-15 NOTE — Telephone Encounter (Signed)
Patient called states he took a dose of Trulance and had several BM however on the last BM he felt like it was incomplete and has been drinking a lot of water. He would like to know if and when should he take another dose

## 2020-02-16 DIAGNOSIS — Z024 Encounter for examination for driving license: Secondary | ICD-10-CM | POA: Diagnosis not present

## 2020-02-23 ENCOUNTER — Ambulatory Visit: Payer: Medicare Other | Admitting: Psychiatry

## 2020-03-08 ENCOUNTER — Ambulatory Visit (INDEPENDENT_AMBULATORY_CARE_PROVIDER_SITE_OTHER): Payer: Medicare Other | Admitting: Psychiatry

## 2020-03-08 DIAGNOSIS — F411 Generalized anxiety disorder: Secondary | ICD-10-CM

## 2020-03-08 NOTE — Progress Notes (Signed)
[facility] Counselor/Therapist Progress Note  Patient ID: Sergio Griffin, MRN: [id number],    Date: 03/08/2020  Time Spent: 60 minutes  12:00noo to 1:00pm  Virtual Visit Note Connected with patient by a video enabled telemedicine/telehealth application or telephone, with their informed consent, and verified patient privacy and that I am speaking with the correct person using two identifiers. I discussed the limitations, risks, security and privacy concerns of performing psychotherapy and management service by telephone and the availability of in person appointments. I also discussed with the patient that there may be a patient responsible charge related to this service. The patient expressed understanding and agreed to proceed. I discussed the treatment planning with the patient. The patient was provided an opportunity to ask questions and all were answered. The patient agreed with the plan and demonstrated an understanding of the instructions. The patient was advised to call  our office if  symptoms worsen or feel they are in a crisis state and need immediate contact.   Therapist Location: [facility] Patient Location: home   Treatment Type: Individual Therapy  Reported Symptoms: anxiety, depression, frustration, loneliness at times,  indecision  Mental Status Exam:  Appearance:   n/a   telehealth     Behavior:  Sharing  Motor:  n/a  telehealth  Speech/Language:   Normal Rate  Affect:  n/a  telehealth  Mood:  anxious and depressed  Thought process:  normal  Thought content:    WNL  Sensory/Perceptual disturbances:    WNL  Orientation:  oriented to person, place, time/date, situation, day of week, month of year and year  Attention:  Good/Fair  Concentration:  Good/Fair  Memory:  some forgetfulness and worse under stress  Fund of knowledge:   Good  Insight:    Good  Judgment:   Good  Impulse Control:  Good   Risk Assessment: Danger to Self:   No Self-injurious Behavior: No Danger to Others: No Duty to Warn:no Physical Aggression / Violence:No  Access to Firearms a concern: No  Gang Involvement:No   Subjective: Patient today reporting anxiety, some depression, frustration, and indecision . Some recurring health issues but "nothing new."  Interventions: Cognitive Behavioral Therapy and Ego-Supportive  Diagnosis:   ICD-10-CM   1. Generalized anxiety disorder  F41.1     Plan: Patient not signing tx goal updates on computer screen due to [place].  Treatment Goals: Goals remain on tx plan as patient works on strategies to [employer].Progress noted each session and documented in "Progress" section of tx plan.  Long term goal:(measurable) Develop healthy cognitive patterns and beliefs about himself and the world that lead to alleviation of depression and anxiety. Overall goal is for patient to reach a point where he used acquired anxiety/stress/depression management skills, and reports his depression and anxiety remain below a "3 on a 10 pt scale" for at least 2 months. (Currently reporting a "7" for anxiety and depression.)  Short term goal: Learn and implement personal skills for managing stress, solving daily problems, and resolving conflicts effectively.  Strategy: Teach patient calming skills, problem solving skills, conflict resolution skills, to manage daily stressors.  PROGRESS: Patient today reports anxiety, frustration, indecision, and some depression.  Some recurring health issues including: GI problems, "suspected kidney stones and with medication the situation resolved itself."  Patient talking through lots of concerns including aging, health concerns, not as mobile, feeling isolated and alone at times.  Denies any SI. Acknowledges that he often has anxious/depressive thoughts that  are difficult to shake and leads to him feeling more anxious and depressed at times "but the anxiety is stronger than  the depression."  Encouraged him to get outside when he is able, staying in contact with family and friends by phone when he cannot go out due to physical reasons, and trying to intercept his anxious/depressive thoughts and replacing them with more positive, reality-based, and empowering thought patterns that do not support anxiety nor depression.  Rates himself an "8-9" on 1-10 scale for anxiety.  Rates himself as an "8" on a 1-10 scale for depression.  These numbers are some higher than last session, and some of the recommendations above were encouraged specifically to try and help alleviate some of his depression and anxiety.   Goal review and progress noted with patient.  Next appt within 2-3 weeks.   Shanon Ace, LCSW

## 2020-03-15 IMAGING — CT CT ABD-PELV W/ CM
2 of 5 series · 16 of 46 positions shown, 18 images · IV contrast (omnipaque)
Comparison: CT of the abdomen pelvis 09/08/2018

CLINICAL DATA: 68-year-old with generalized abdominal pain. Change
in bowel habits.

EXAM:
CT ABDOMEN AND PELVIS WITH CONTRAST
TECHNIQUE: Multidetector CT imaging of the abdomen and pelvis was performed
using the standard protocol following bolus administration of
intravenous contrast.
CONTRAST:  100mL OMNIPAQUE IOHEXOL 300 MG/ML  SOLN

[Series 3: a/p w/ 5mm · axial · 0.98mm/px · z∈[+754,+1224]mm · 13 of 106 slices shown, 15 images]
[im 6/106  soft-tissue]
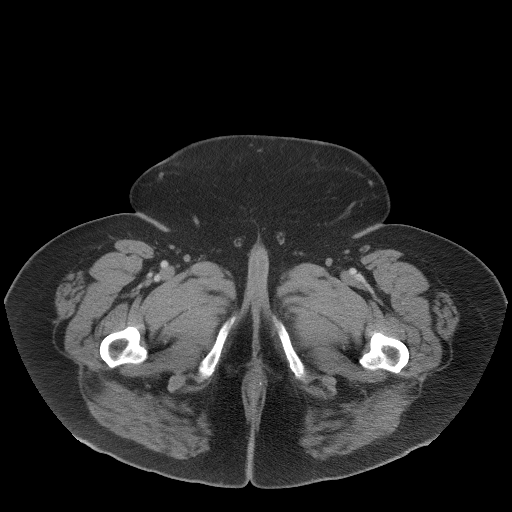
[im 6/106  bone]
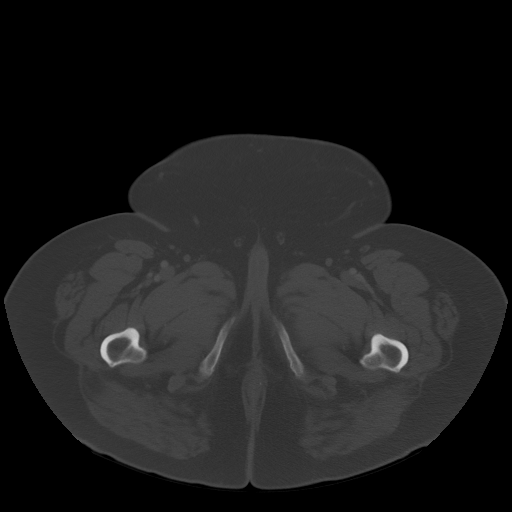
[im 12/106  soft-tissue]
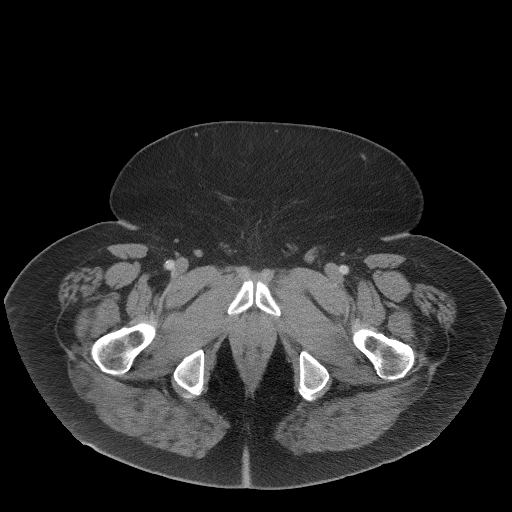
[im 24/106  soft-tissue]
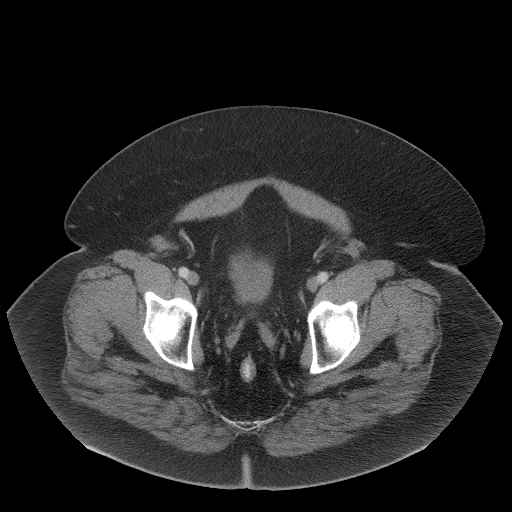
[im 30/106  soft-tissue]
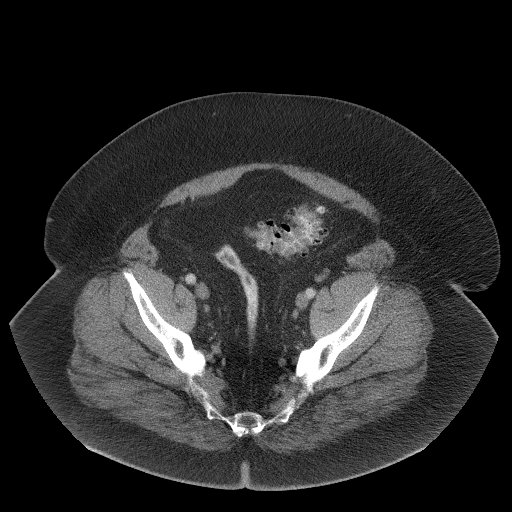
[im 36/106  soft-tissue]
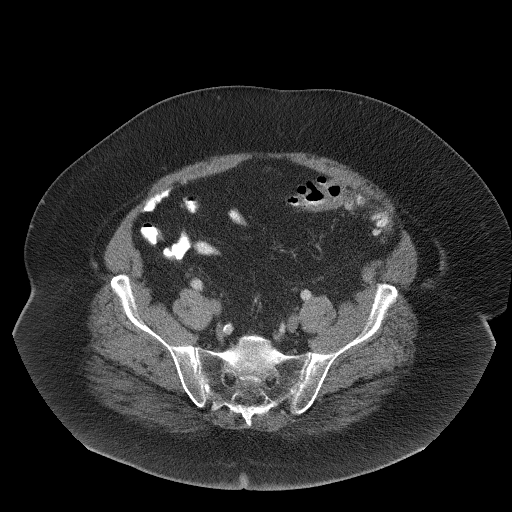
[im 47/106  soft-tissue]
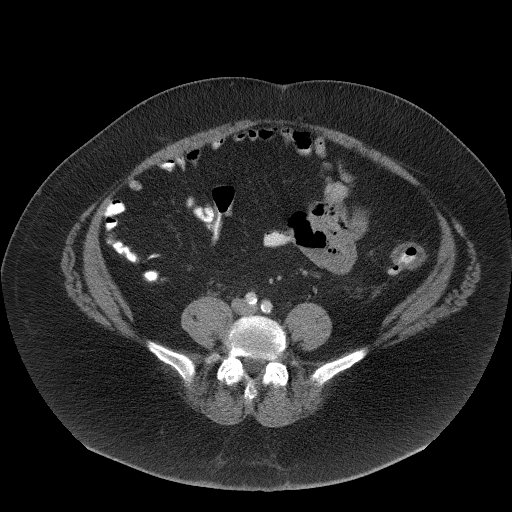
[im 53/106  soft-tissue]
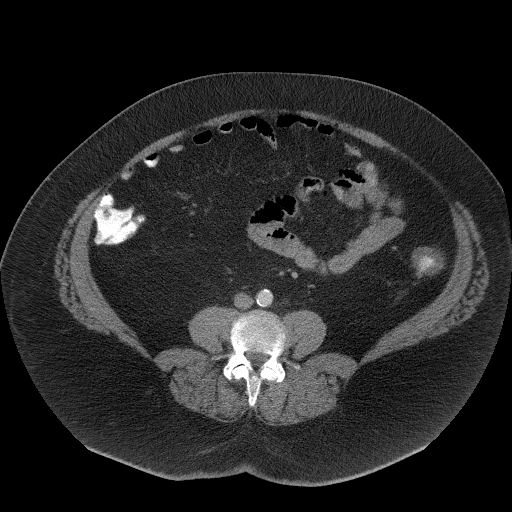
[im 59/106  soft-tissue]
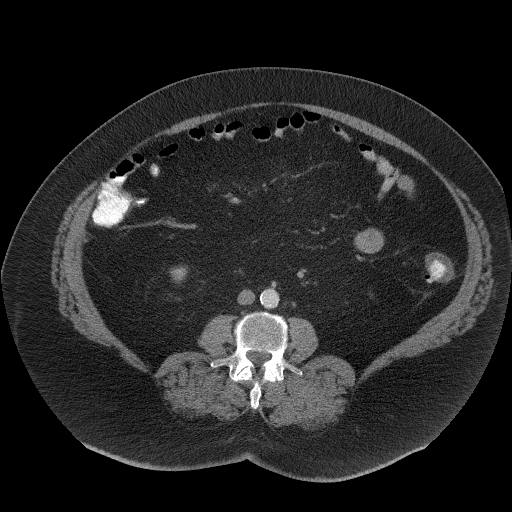
[im 71/106  soft-tissue]
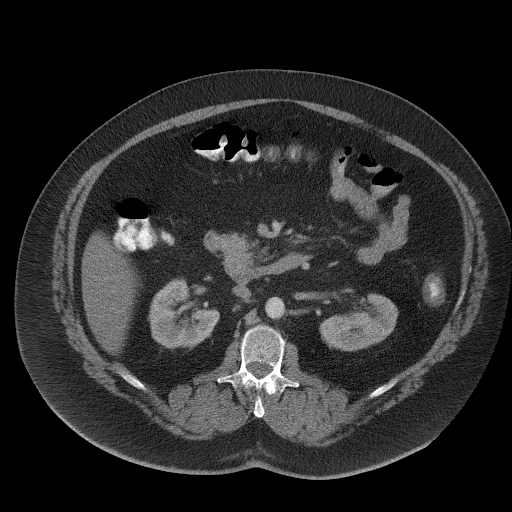
[im 71/106  bone]
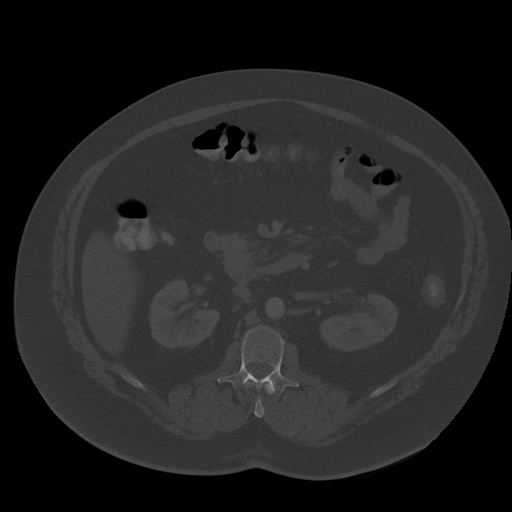
[im 76/106  soft-tissue]
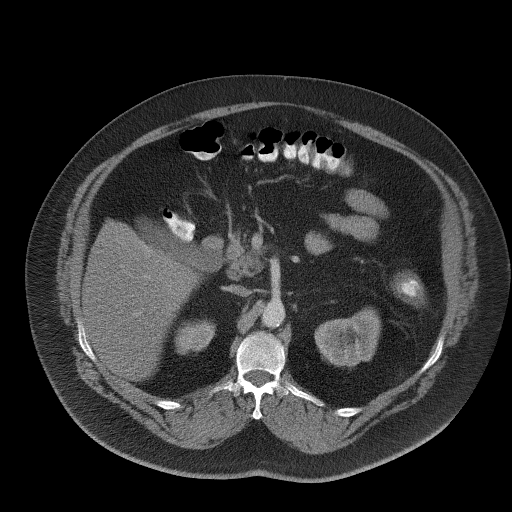
[im 82/106  soft-tissue]
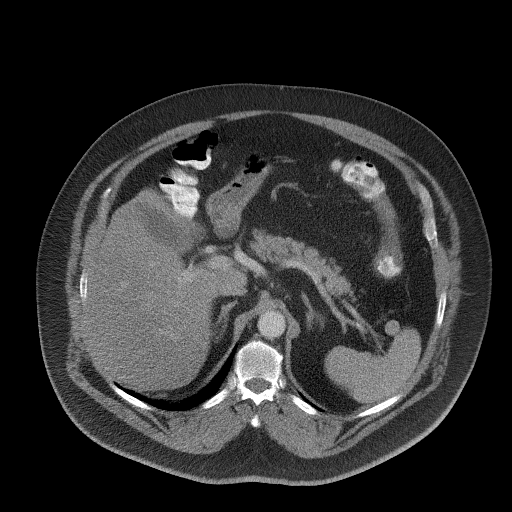
[im 94/106  soft-tissue]
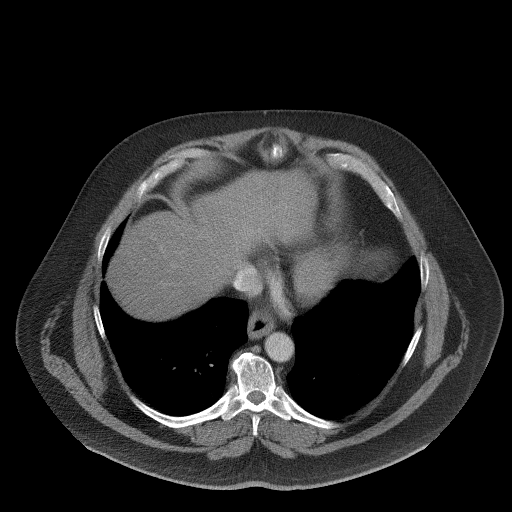
[im 100/106  soft-tissue]
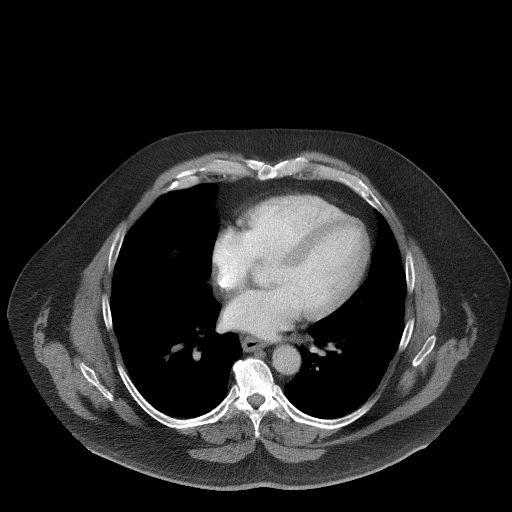

[Series 6: a/p w/ cor · coronal · 1.03mm/px · 3 of 213 slices shown]
[im 71/213  soft-tissue]
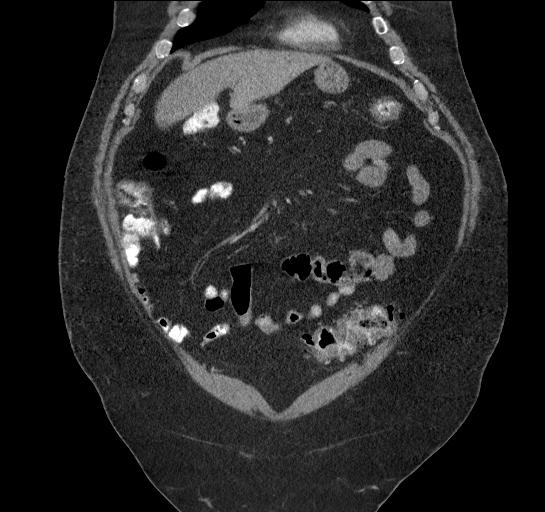
[im 95/213  soft-tissue]
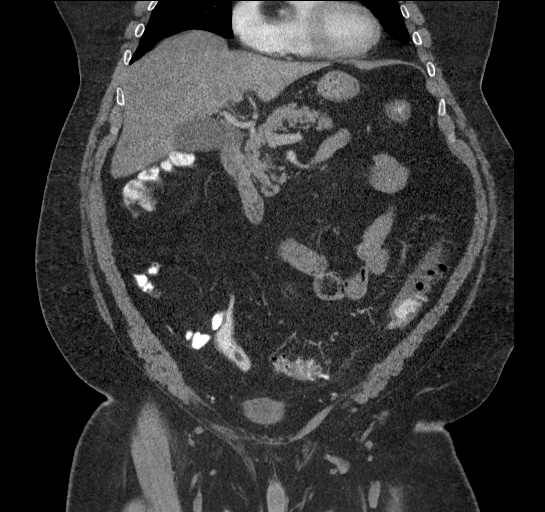
[im 118/213  soft-tissue]
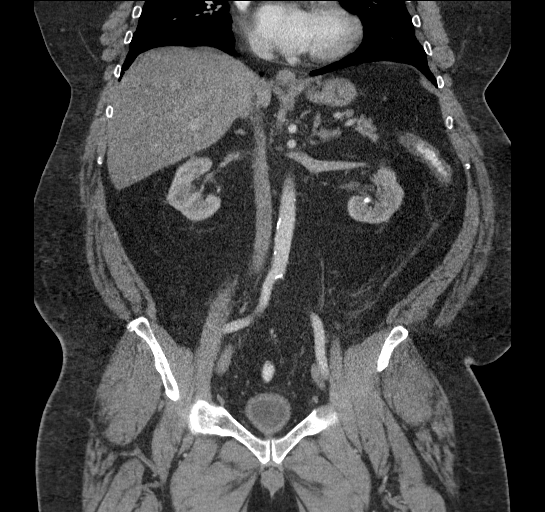

[16 of 46 positions shown; findings below may reference images not displayed]

FINDINGS: Lower chest: There is a nodule in the superior segment of the right
lower lobe that measures 9 x 6 mm on the coronal images. This area
measured up to 8 mm on the exam from 4896. No large pleural
effusions.

Hepatobiliary: There may be hepatic steatosis. No focal liver
abnormality. Normal appearance of the gallbladder. Portal venous
system is patent. No biliary dilatation.

Pancreas: Unremarkable. No pancreatic ductal dilatation or
surrounding inflammatory changes.

Spleen: Normal in size without focal abnormality.

Adrenals/Urinary Tract: Normal adrenal glands. Urinary bladder is
decompressed. Normal appearance of both kidneys without
hydronephrosis. No suspicious renal lesions. There is a
nonobstructive stone in the left kidney lower pole that measures 7
mm. 5 mm stone in the upper/mid left kidney.

Stomach/Bowel: Colonic diverticulosis involving the sigmoid colon
and descending colon. Wall thickening involving the descending
colon. The wall thickening involving the descending colon persists
on the delayed images. Mild pericolonic edema in the left lower
abdomen. Normal appearance of small bowel without obstruction.
Normal appearance of the stomach and duodenum. There may be a small
hiatal hernia.

Vascular/Lymphatic: Atherosclerotic calcifications in the abdominal
aorta without aneurysm. Main visceral arteries are patent.
Atherosclerotic calcifications in the iliac arteries. No
abdominopelvic lymphadenopathy.

Reproductive: Calcifications in the prostate.

Other: Negative for ascites.  No evidence for free air.

Musculoskeletal: No acute bone abnormality. Mild compression
deformity involving the T7 vertebral body is chronic.
IMPRESSION: 1. Diffuse wall thickening involving the descending colon with mild
pericolonic stranding. Although there are multiple colonic
diverticula, the findings are more compatible with an infectious or
inflammatory colitis rather than diverticulitis.
2. Persistent nodule in the superior segment of the right lower
lobe. Nodule measures up to 9 mm and has minimally enlarged since
4896. Nodule was poorly characterized on the exam from 4896 due to
volume loss and adjacent disease. Based on the minimal change,
suspect this is a benign finding but consider a 6-12 month follow-up
chest CT to ensure stability.
3. Nonobstructive left kidney stones.

## 2020-03-16 ENCOUNTER — Ambulatory Visit: Payer: Medicare Other | Admitting: Gastroenterology

## 2020-03-22 ENCOUNTER — Ambulatory Visit (INDEPENDENT_AMBULATORY_CARE_PROVIDER_SITE_OTHER): Payer: Medicare Other | Admitting: Psychiatry

## 2020-03-22 DIAGNOSIS — F411 Generalized anxiety disorder: Secondary | ICD-10-CM

## 2020-03-22 NOTE — Progress Notes (Signed)
[facility] Counselor/Therapist Progress Note  Patient ID: Sergio Griffin, MRN: [id number],    Date: 03/22/2020  Time Spent: 60 minutes   10:55am to 11:55am  Virtual Visit Note via [place] with patient by a video enabled telemedicine/telehealth application or telephone, with their informed consent, and verified patient privacy and that I am speaking with the correct person using two identifiers. I discussed the limitations, risks, security and privacy concerns of performing psychotherapy and management service by telephone and the availability of in person appointments. I also discussed with the patient that there may be a patient responsible charge related to this service. The patient expressed understanding and agreed to proceed. I discussed the treatment planning with the patient. The patient was provided an opportunity to ask questions and all were answered. The patient agreed with the plan and demonstrated an understanding of the instructions. The patient was advised to call  our office if  symptoms worsen or feel they are in a crisis state and need immediate contact.   Therapist Location: [facility] Patient Location: home   Treatment Type: Individual Therapy  Reported Symptoms:  Anxiety, depression, anger, frustrations and "easily frustrated", "Anxiety is strongest  Mental Status Exam:  Appearance:   Casual     Behavior:  Appropriate, Sharing and Motivated  Motor:  Normal  Speech/Language:   Normal Rate  Affect:  anxiety, frustration, depression  Mood:  anxious and depressed  Thought process:  goal directed  Thought content:    WNL  Sensory/Perceptual disturbances:    WNL  Orientation:  oriented to person, place, time/date, situation, day of week, month of year and year  Attention:  Fair, Good  Concentration:  Good and Fair  Memory:  some forgetfulness and worse when stressed  Fund of knowledge:   Good  Insight:    Good and Fair  Judgment:   Good   Impulse Control:  Good   Risk Assessment: Danger to Self:  No Self-injurious Behavior: No Danger to Others: No Duty to Warn:no Physical Aggression / Violence:No  Access to Firearms a concern: No  Gang Involvement:No   Subjective:  Patient report symptoms of anxiety, depression, frustrations, and anger. Still anxious about his apt complex being sold and not knowing more info. More "motivated in some ways but not in other."   Interventions: Cognitive Behavioral Therapy and Solution-Oriented/Positive Psychology  Diagnosis:   ICD-10-CM   1. Generalized anxiety disorder  F41.1     Plan: Patient not signing tx goal updates on computer screen due to [place].  Treatment Goals: Goals remain on tx plan as patient works on strategies to [employer].Progress noted each session and documented in "Progress" section of tx plan.  Long term goal:(measurable) Develop healthy cognitive patterns and beliefs about himself and the world that lead to alleviation of depression and anxiety. Overall goal is for patient to reach a point where he used acquired anxiety/stress/depression management skills, and reports his depression and anxiety remain below a "3 on a 10 pt scale" for at least 2 months. (Currently reporting a "7" for anxiety and depression.)  Short term goal: Learn and implement personal skills for managing stress, solving daily problems, and resolving conflicts effectively.  Strategy: Teach patient calming skills, problem solving skills, conflict resolution skills, to manage daily stressors.  PROGRESS: Patient today reports anxiety, depression, frustrations, and anger. Denies any SI.  Motivated on some things but more unmotivated when it comes to more simple things. "I really have things to do  but just hard to sometimes get started."  Discussed some strategies that could help him be more motivated to get up and complete tasks or be involved in things he may enjoy. Does feel  the pandemic has" had an effect on his doing things, being involved with people, less structure in my life."  Looked at some options for him that would help him get out a little more including a store he mentions where he feels comfortable going to where the people are friendly and welcome him..  Is feeling some better physically, and is focused on improving his deep breathing which can help. Seems more fearful at times. "Lost about 10lbs within 3 weeks, less sugar and less carbs." Feels if he can lose more weight, his walking and breathing will improve more. Still having some anxious/negative thoughts and reviewed the process of intercepting those thoughts, challenging them, and replacing them with thoughts that are more reality-based, positive, and empowering.  Stresses over "all the things I need to remember to do" and plans to uses a dry-erase board and write them down "since I am more of a visual person." Feels good about that option "and I won't have to remember them all."    Goal review and progress noted wit patient.  Next appt within 3 weeks.   Shanon Ace, LCSW

## 2020-03-23 ENCOUNTER — Telehealth: Payer: Self-pay | Admitting: Psychiatry

## 2020-03-23 NOTE — Telephone Encounter (Signed)
Mr. esiah, coffell are scheduled for a virtual visit with your provider today.    Just as we do with appointments in the office, we must obtain your consent to participate.  Your consent will be active for this visit and any virtual visit you may have with one of our providers in the next 365 days.    If you have a MyChart account, I can also send a copy of this consent to you electronically.  All virtual visits are billed to your insurance company just like a traditional visit in the office.  As this is a virtual visit, video technology does not allow for your provider to perform a traditional examination.  This may limit your provider's ability to fully assess your condition.  If your provider identifies any concerns that need to be evaluated in person or the need to arrange testing such as labs, EKG, etc, we will make arrangements to do so.    Although advances in technology are sophisticated, we cannot ensure that it will always work on either your end or our end.  If the connection with a video visit is poor, we may have to switch to a telephone visit.  With either a video or telephone visit, we are not always able to ensure that we have a secure connection.   I need to obtain your verbal consent now.   Are you willing to proceed with your visit today?   WILFRID MAGUIRE has provided verbal consent on 03/23/2020 for a virtual visit (video or telephone).   Shanon Ace, LCSW 03/23/2020  2:58 PM

## 2020-03-25 DIAGNOSIS — H401134 Primary open-angle glaucoma, bilateral, indeterminate stage: Secondary | ICD-10-CM | POA: Diagnosis not present

## 2020-03-28 ENCOUNTER — Other Ambulatory Visit: Payer: Self-pay | Admitting: Gastroenterology

## 2020-04-04 ENCOUNTER — Ambulatory Visit (INDEPENDENT_AMBULATORY_CARE_PROVIDER_SITE_OTHER): Payer: Medicare Other | Admitting: Psychiatry

## 2020-04-04 ENCOUNTER — Telehealth (INDEPENDENT_AMBULATORY_CARE_PROVIDER_SITE_OTHER): Payer: Medicare Other | Admitting: Psychiatry

## 2020-04-04 ENCOUNTER — Encounter: Payer: Self-pay | Admitting: Psychiatry

## 2020-04-04 DIAGNOSIS — F331 Major depressive disorder, recurrent, moderate: Secondary | ICD-10-CM

## 2020-04-04 DIAGNOSIS — F411 Generalized anxiety disorder: Secondary | ICD-10-CM

## 2020-04-04 DIAGNOSIS — F410 Panic disorder [episodic paroxysmal anxiety] without agoraphobia: Secondary | ICD-10-CM | POA: Diagnosis not present

## 2020-04-04 MED ORDER — BUSPIRONE HCL 15 MG PO TABS: ORAL_TABLET | ORAL | 1 refills | Status: DC

## 2020-04-04 MED ORDER — CLONAZEPAM 0.5 MG PO TABS: 0.5000 mg | ORAL_TABLET | Freq: Two times a day (BID) | ORAL | 1 refills | Status: DC | PRN

## 2020-04-04 MED ORDER — BUPROPION HCL ER (XL) 300 MG PO TB24: 300.0000 mg | ORAL_TABLET | Freq: Every day | ORAL | 0 refills | Status: DC

## 2020-04-04 NOTE — Progress Notes (Addendum)
Sergio Griffin [id number] 07-28-1951 69 y.o.  Virtual Visit via Telephone Note  I connected with pt on 04/04/20 at 10:30 AM EDT by telephone and verified that I am speaking with the correct person using two identifiers.   I discussed the limitations, risks, security and privacy concerns of performing an evaluation and management service by telephone and the availability of in person appointments. I also discussed with the patient that there may be a patient responsible charge related to this service. The patient expressed understanding and agreed to proceed.   I discussed the assessment and treatment plan with the patient. The patient was provided an opportunity to ask questions and all were answered. The patient agreed with the plan and demonstrated an understanding of the instructions.   The patient was advised to call back or seek an in-person evaluation if the symptoms worsen or if the condition fails to improve as anticipated.  I provided 30 minutes of non-face-to-face time during this encounter.  The patient was located at home.  The provider was located at [place].   Thayer Headings, PMHNP   Subjective:   Patient ID:  Sergio Griffin is a 69 y.o. (DOB 11/09/1951) male.  Chief Complaint:  Chief Complaint  Patient presents with  . Anxiety  . Follow-up    Depression, Insomnia    HPI Sergio Griffin presents for follow-up of depression and anxiety. He reports that he has been having increased anxiety. He reports that he has been having muscle tension to include shoulders and backs. Also gripping his hands tightly. He reports that he has been having shortness of breath and discomfort on exertion and this causes anxiety and decreased motivation towards doing chores. He reports that he has had some worry about different things. He reports that he is having panic attacks several times a week. His apartment building was foreclosed and was recently purchased and he does not  know what the plan is and how will it affect his lease. He also has anxiety about having upcoming annual opthalmology exam that also is required to renew his drivers license since in the past DMV has said they did not receive the form and were going to suspend his license.   He reports that his depression has not "been as bad" at times although he reports that increased anxiety results in sad mood at times. He reports that he has been sleeping excessively at times. He reports that his energy has been low. Motivation has also been lower. He reports that his concentration is not as good as he would like and he has to talk him through tasks. Appetite was increased for a periods of time and for the past 4-5 weeks he has had decreased appetite. He reports that he has had recent nausea. Denies SI.   Past Psychiatric Medication Trials: Prozac- Adverse effects Wellbutrin- helpful for depression Celexa- Affective dulling Lexapro Imipramine- Helpful for anxiety, worsened glaucoma Klonopin- effective Ambien-ineffective.   Review of Systems:  Review of Systems  Respiratory:       Dyspnea on exertion and will have back pain. He notices that he is using his accessory muscles (back, shoulders) to breathe and will have back pain and notice his shoulders "are up to my ears."   Gastrointestinal: Positive for nausea.  Musculoskeletal: Positive for back pain and gait problem.  Psychiatric/Behavioral:       Please refer to HPI    He is planning to start therapy soon.   Medications: I have  reviewed the patient's current medications.  Current Outpatient Medications  Medication Sig Dispense Refill  . acetaminophen (TYLENOL) 500 MG tablet Take 500-1,000 mg by mouth every 6 (six) hours as needed for moderate pain or headache.    Marland Kitchen amLODipine (NORVASC) 5 MG tablet Take 5 mg by mouth daily.    Marland Kitchen aspirin EC 81 MG tablet Take 81 mg by mouth at bedtime.     . Carboxymethylcellul-Glycerin (LUBRICATING EYE DROPS  OP) Place 1 drop into both eyes daily as needed (dry eyes).    . clonazePAM (KLONOPIN) 0.5 MG tablet Take 1 tablet (0.5 mg total) by mouth 2 (two) times daily as needed for anxiety. 180 tablet 1  . DM-APAP-CPM (CORICIDIN HBP PO) Take 1 tablet by mouth daily as needed (severe allergies).    . furosemide (LASIX) 20 MG tablet Take 20 mg by mouth daily.     Marland Kitchen latanoprost (XALATAN) 0.005 % ophthalmic solution Place 1 drop into both eyes at bedtime.    Marland Kitchen losartan (COZAAR) 50 MG tablet Take 100 mg by mouth daily.     . metoprolol succinate (TOPROL-XL) 25 MG 24 hr tablet Take 12.5-25 mg by mouth See admin instructions. Take 25 mg in the morning and 12.5 mg in the evening    . metroNIDAZOLE (FLAGYL) 500 MG tablet Take 1 tablet (500 mg total) by mouth 2 (two) times daily. 10 tablet 0  . nitroGLYCERIN (NITROSTAT) 0.4 MG SL tablet Place 1 tablet (0.4 mg total) under the tongue every 5 (five) minutes x 3 doses as needed for chest pain. 30 tablet 1  . Omega-3 Fatty Acids (FISH OIL) 1000 MG CAPS Take 1,000 mg by mouth daily.    . ondansetron (ZOFRAN) 4 MG tablet Take 4 mg by mouth every 8 (eight) hours as needed for nausea or vomiting.    . ondansetron (ZOFRAN-ODT) 4 MG disintegrating tablet DISSOLVE 1 TABLET IN MOUTH EVERY 8 HOURS AS NEEDED FOR NAUSEA FOR VOMITING 30 tablet 0  . phenazopyridine (PYRIDIUM) 200 MG tablet Take 1 tablet (200 mg total) by mouth 3 (three) times daily as needed for pain. 10 tablet 0  . Plecanatide (TRULANCE) 3 MG TABS Take 3 mg by mouth daily. 30 tablet 1  . prasugrel (EFFIENT) 10 MG TABS tablet Take 1 tablet (10 mg total) by mouth at bedtime. Resume 48 hours after surgery 30 tablet   . rosuvastatin (CRESTOR) 40 MG tablet Take 40 mg by mouth every evening.     . sucralfate (CARAFATE) 1 g tablet Take 1 tablet (1 g total) by mouth 2 (two) times daily. As needed (Patient taking differently: Take 1 g by mouth at bedtime. ) 60 tablet 6  . traMADol (ULTRAM) 50 MG tablet Take 1-2 tablets  (50-100 mg total) by mouth every 6 (six) hours as needed for moderate pain. 15 tablet 0  . buPROPion (WELLBUTRIN XL) 300 MG 24 hr tablet Take 1 tablet (300 mg total) by mouth daily. 90 tablet 0  . busPIRone (BUSPAR) 15 MG tablet Take 1/3 tablet p.o. twice daily for 1 week, then take 2/3 tablet p.o. twice daily for 1 week, then take 1 tablet p.o. twice daily 60 tablet 1   No current facility-administered medications for this visit.    Medication Side Effects: None  Allergies:  Allergies  Allergen Reactions  . Ibuprofen Diarrhea and Nausea Only  . Propoxyphene Itching    Darvocet  . Sulfa Antibiotics Itching    Past Medical History:  Diagnosis Date  . Anxiety   .  Barrett's esophagus   . CAD (coronary artery disease)    PCI, stent 2011  . Cardiac arrhythmia   . Chicken pox   . Depression   . Diverticulosis   . Gastric ulcer   . GERD (gastroesophageal reflux disease)   . Glaucoma   . Heart attack ([place]) 01/29/2019  . History of myocardial infarction   . Hyperlipidemia   . Hypertension   . Kidney stones   . Kidney stones   . Pneumonia 11/2014  . Prostatitis   . Reflux   . Sleep apnea   . UTI (lower urinary tract infection)     Family History  Problem Relation Age of Onset  . Arthritis Mother   . Hyperlipidemia Mother   . Stroke Mother   . Hypertension Mother   . Ovarian cancer Mother   . Hyperlipidemia Father   . Heart disease Father   . Hypertension Father   . Non-Hodgkin's lymphoma Father   . Hyperlipidemia Maternal Grandmother   . Heart disease Maternal Grandmother   . Stroke Maternal Grandmother   . Hypertension Maternal Grandmother   . Diabetes Maternal Grandmother   . Hyperlipidemia Maternal Grandfather   . Heart disease Maternal Grandfather   . Hypertension Maternal Grandfather   . Heart disease Paternal Grandmother   . Heart disease Paternal Grandfather   . Stroke Paternal Grandfather   . Hypertension Paternal Grandfather   . Congestive Heart  Failure Brother   . Colon cancer Neg Hx   . Esophageal cancer Neg Hx   . Pancreatic cancer Neg Hx   . Stomach cancer Neg Hx   . Liver disease Neg Hx     Social History   Socioeconomic History  . Marital status: Divorced    Spouse name: Not on file  . Number of children: 2  . Years of education: 61  . Highest education level: Not on file  Occupational History  . Occupation: Estate manager/land agent    Comment: Retired  Tobacco Use  . Smoking status: Former Smoker    Types: Cigarettes  . Smokeless tobacco: Never Used  . Tobacco comment: quit 1987  Substance and Sexual Activity  . Alcohol use: No  . Drug use: No  . Sexual activity: Not Currently    Partners: Female  Other Topics Concern  . Not on file  Social History Narrative   Fun: Paint, cook, travel   Denies religious beliefs effecting health care.    Social Determinants of Health   Financial Resource Strain:   . Difficulty of Paying Living Expenses:   Food Insecurity:   . Worried About Charity fundraiser in the Last Year:   . Arboriculturist in the Last Year:   Transportation Needs:   . Film/video editor (Medical):   Marland Kitchen Lack of Transportation (Non-Medical):   Physical Activity:   . Days of Exercise per Week:   . Minutes of Exercise per Session:   Stress:   . Feeling of Stress :   Social Connections:   . Frequency of Communication with Friends and Family:   . Frequency of Social Gatherings with Friends and Family:   . Attends Religious Services:   . Active Member of Clubs or Organizations:   . Attends Archivist Meetings:   Marland Kitchen Marital Status:   Intimate Partner Violence:   . Fear of Current or Ex-Partner:   . Emotionally Abused:   Marland Kitchen Physically Abused:   . Sexually Abused:     Past Medical  History, Surgical history, Social history, and Family history were reviewed and updated as appropriate.   Please see review of systems for further details on the patient's review from today.   Objective:    Physical Exam:  There were no vitals taken for this visit.  Physical Exam Neurological:     Mental Status: He is alert and oriented to person, place, and time.     Cranial Nerves: No dysarthria.  Psychiatric:        Attention and Perception: Attention and perception normal.        Mood and Affect: Mood is anxious.        Speech: Speech normal.        Behavior: Behavior is cooperative.        Thought Content: Thought content normal. Thought content is not paranoid or delusional. Thought content does not include homicidal or suicidal ideation. Thought content does not include homicidal or suicidal plan.        Cognition and Memory: Cognition and memory normal.        Judgment: Judgment normal.     Comments: Insight intact Rumination     Lab Review:     Component Value Date/Time   NA 136 10/20/2019 1419   K 4.7 10/20/2019 1419   CL 102 10/20/2019 1419   CO2 26 10/20/2019 1419   GLUCOSE 149 (H) 10/20/2019 1419   BUN 36 (H) 10/20/2019 1419   CREATININE 2.36 (H) 10/20/2019 1419   CALCIUM 9.0 10/20/2019 1419   PROT 6.9 10/07/2019 1055   ALBUMIN 4.1 10/07/2019 1055   AST 17 10/07/2019 1055   ALT 24 10/07/2019 1055   ALKPHOS 47 10/07/2019 1055   BILITOT 0.5 10/07/2019 1055   GFRNONAA 27 (L) 10/20/2019 1419   GFRAA 31 (L) 10/20/2019 1419       Component Value Date/Time   WBC 11.8 (H) 10/20/2019 1419   RBC 5.02 10/20/2019 1419   HGB 14.6 10/20/2019 1419   HCT 46.9 10/20/2019 1419   PLT 284 10/20/2019 1419   MCV 93.4 10/20/2019 1419   MCH 29.1 10/20/2019 1419   MCHC 31.1 10/20/2019 1419   RDW 13.5 10/20/2019 1419   LYMPHSABS 2.7 10/07/2019 1055   MONOABS 1.0 10/07/2019 1055   EOSABS 0.3 10/07/2019 1055   BASOSABS 0.2 (H) 10/07/2019 1055    No results found for: POCLITH, LITHIUM   No results found for: PHENYTOIN, PHENOBARB, VALPROATE, CBMZ   .res Assessment: Plan:   Patient seen for 30 minutes and time spent discussing potential benefits, risks, and side effects  of BuSpar for anxiety.  Discussed that this could be taken in combination with Wellbutrin XL for treatment of anxiety signs and symptoms.  Patient agrees to trial of BuSpar for anxiety. Start BuSpar 15 mg 1/3 tablet twice daily for 1 week, then increase to 2/3 tablet twice daily for 1 week, then increase to 1 tablet twice daily for anxiety. Continue Wellbutrin XL 300 mg daily for depression. Continue Klonopin 0.5 mg twice daily as needed for anxiety. Recommend continuing psychotherapy with Rinaldo Cloud, LCSW. Patient to follow-up with this provider in 6 weeks or sooner if clinically indicated. Patient advised to contact office with any questions, adverse effects, or acute worsening in signs and symptoms.  Sergio Griffin was seen today for anxiety and follow-up.  Diagnoses and all orders for this visit:  Generalized anxiety disorder -     busPIRone (BUSPAR) 15 MG tablet; Take 1/3 tablet p.o. twice daily for 1 week, then take 2/3  tablet p.o. twice daily for 1 week, then take 1 tablet p.o. twice daily  Moderate episode of recurrent major depressive disorder (HCC) -     buPROPion (WELLBUTRIN XL) 300 MG 24 hr tablet; Take 1 tablet (300 mg total) by mouth daily.  Panic disorder -     busPIRone (BUSPAR) 15 MG tablet; Take 1/3 tablet p.o. twice daily for 1 week, then take 2/3 tablet p.o. twice daily for 1 week, then take 1 tablet p.o. twice daily -     clonazePAM (KLONOPIN) 0.5 MG tablet; Take 1 tablet (0.5 mg total) by mouth 2 (two) times daily as needed for anxiety.    Please see After Visit Summary for patient specific instructions.  Future Appointments  Date Time Provider [place]  04/20/2020 12:00 PM Shanon Ace, LCSW [facility] None  05/04/2020 12:00 PM Shanon Ace, LCSW [facility] None  05/18/2020 12:00 PM Shanon Ace, LCSW [facility] None    No orders of the defined types were placed in this encounter.     -------------------------------

## 2020-04-04 NOTE — Progress Notes (Signed)
[facility] Counselor/Therapist Progress Note  Patient ID: Sergio Griffin, MRN: [id number],    Date: 04/04/2020  Time Spent: 60 minutes  12:00noon to 1:00pm   Virtual Visit Note via [employer] with patient by a video enabled telemedicine/telehealth application or telephone, with their informed consent, and verified patient privacy and that I am speaking with the correct person using two identifiers. I discussed the limitations, risks, security and privacy concerns of performing psychotherapy and management service by telephone and the availability of in person appointments. I also discussed with the patient that there may be a patient responsible charge related to this service. The patient expressed understanding and agreed to proceed. I discussed the treatment planning with the patient. The patient was provided an opportunity to ask questions and all were answered. The patient agreed with the plan and demonstrated an understanding of the instructions. The patient was advised to call  our office if  symptoms worsen or feel they are in a crisis state and need immediate contact.   Therapist Location: [facility] Patient Location: home  Treatment Type: Individual Therapy  Reported Symptoms: anxiety, depression, impatience, frustration, some anger, low energy, give up task completion "at the first sign on fatigue."  Mental Status Exam:  Appearance:   Casual     Behavior:  Appropriate and Sharing  Motor:  Normal  Speech/Language:   Normal Rate  Affect:  anxious, depressed, frustrated  Mood:  anxious, depressed and irritable  Thought process:  goal directed  Thought content:    WNL  Sensory/Perceptual disturbances:    WNL  Orientation:  oriented to person, place, time/date, situation, day of week, month of year and year  Attention:  Fair  Concentration:  Good and Fair  Memory:  some "word searching" and "it may be related some to my trying not to forget"   Fund of knowledge:   Good  Insight:    Good  Judgment:   Good  Impulse Control:  Good   Risk Assessment: Danger to Self:  No Self-injurious Behavior: No Danger to Others: No Duty to Warn:no Physical Aggression / Violence:No  Access to Firearms a concern: No  Gang Involvement:No   Subjective: Patient today reports anxiety, depression, and "difficulty being patient with himeself (not others) and my motivation and follow through is slow and I give up at the first sign of fatigue."  Interventions: Cognitive Behavioral Therapy and Solution-Oriented/Positive Psychology  Diagnosis:   ICD-10-CM   1. Generalized anxiety disorder  F41.1     Plan: Patient not signing tx goal updates on computer screen due to [place].  Treatment Goals: Goals remain on tx plan as patient works on strategies to [employer].Progress noted each session and documented in "Progress" section of tx plan.  Long term goal:(measurable) Develop healthy cognitive patterns and beliefs about himself and the world that lead to alleviation of depression and anxiety. Overall goal is for patient to reach a point where he used acquired anxiety/stress/depression management skills, and reports his depression and anxiety remain below a "3 on a 10 pt scale" for at least 2 months. (Currently reporting a "7" for anxiety and depression.)  Short term goal: Learn and implement personal skills for managing stress, solving daily problems, and resolving conflicts effectively.  Strategy: Teach patient calming skills, problem solving skills, conflict resolution skills, to manage daily stressors.  PROGRESS: Patient today reporting anxiety, anger, depression, frustrations, irritability, lower energy, less motivation, and tendency to give up on task completion "at  the first sign of fatigue or pain."  Has fallen a couple times in recent weeks but not hurt badly.  One occasion I was "trying to put on my pants while standing  and lost my balance. I realize that at this point in my life I should be sitting when I'm changing my clothes." The other fall within past few weeks, "I was leaning over to pick up something and toppled over." Is already thinking "I need to pay closer attention and how can I be more careful?" [facility] to his home in [place], [place]. Trying to be more intentional in reducing some of what he feels are related to his anxiety such as his hands clenching without him intentionally doing it, "and what I do is to flatten my hands back out, but if I'm not paying careful attention, they will go back to clenching." Reviewed some strategies for better managing anxiety and stress, as we've discussed before. Encouraged him to safely get outside as he is able, practice more self-tolerance (which was discussed some today), healthy nutrition, practice looking for the positives versus the negatives, work with the physical therapist to make some gains, have contact with family and others with whom he can feel supported.   Goal review and progress/challenges noted with patient.  Next appt within 3-4 weeks.   Shanon Ace, LCSW

## 2020-04-05 DIAGNOSIS — M25561 Pain in right knee: Secondary | ICD-10-CM | POA: Diagnosis not present

## 2020-04-05 DIAGNOSIS — M545 Low back pain: Secondary | ICD-10-CM | POA: Diagnosis not present

## 2020-04-05 DIAGNOSIS — M25551 Pain in right hip: Secondary | ICD-10-CM | POA: Diagnosis not present

## 2020-04-05 DIAGNOSIS — S72111D Displaced fracture of greater trochanter of right femur, subsequent encounter for closed fracture with routine healing: Secondary | ICD-10-CM | POA: Diagnosis not present

## 2020-04-19 ENCOUNTER — Telehealth: Payer: Medicare Other | Admitting: Physician Assistant

## 2020-04-19 DIAGNOSIS — M545 Low back pain: Secondary | ICD-10-CM | POA: Diagnosis not present

## 2020-04-19 DIAGNOSIS — M25551 Pain in right hip: Secondary | ICD-10-CM | POA: Diagnosis not present

## 2020-04-19 DIAGNOSIS — S72111D Displaced fracture of greater trochanter of right femur, subsequent encounter for closed fracture with routine healing: Secondary | ICD-10-CM | POA: Diagnosis not present

## 2020-04-19 DIAGNOSIS — M25561 Pain in right knee: Secondary | ICD-10-CM | POA: Diagnosis not present

## 2020-04-20 ENCOUNTER — Ambulatory Visit (INDEPENDENT_AMBULATORY_CARE_PROVIDER_SITE_OTHER): Payer: Medicare Other | Admitting: Psychiatry

## 2020-04-20 DIAGNOSIS — F411 Generalized anxiety disorder: Secondary | ICD-10-CM | POA: Diagnosis not present

## 2020-04-20 NOTE — Progress Notes (Signed)
[facility] Counselor/Therapist Progress Note  Patient ID: Sergio Griffin, MRN: [id number],    Date: 04/20/2020  Time Spent:  60 minutes  11:55am to 12:55pm  Virtual Visit Note via [employer], systems with patient by a video enabled telemedicine/telehealth application or telephone, with their informed consent, and verified patient privacy and that I am speaking with the correct person using two identifiers. I discussed the limitations, risks, security and privacy concerns of performing psychotherapy and management service by telephone and the availability of in person appointments. I also discussed with the patient that there may be a patient responsible charge related to this service. The patient expressed understanding and agreed to proceed. I discussed the treatment planning with the patient. The patient was provided an opportunity to ask questions and all were answered. The patient agreed with the plan and demonstrated an understanding of the instructions. The patient was advised to call  our office if  symptoms worsen or feel they are in a crisis state and need immediate contact.   Therapist Location: [facility] Patient Location: home   Treatment Type: Individual Therapy  Reported Symptoms: anxiety, depression not quite as bad, nervous, fearful..  States that his main symptom is Anxiety.   Mental Status Exam:  Appearance:   Casual     Behavior:  Appropriate, Sharing and Motivated  Motor:  moves around ok but needing to be more careful due to past falls  Speech/Language:   Normal Rate  Affect:  anxiety  Mood:  anxious  Thought process:  goal directed  Thought content:    WNL  Sensory/Perceptual disturbances:    WNL  Orientation:  oriented to person, place, time/date, situation, day of week, month of year and year  Attention:  Good  Concentration:  Good  Memory:  Not as much forgetting and not as much relying on notes  Fund of knowledge:   Good  Insight:     Good  Judgment:   Good  Impulse Control:  Good   Risk Assessment: Danger to Self:  No Self-injurious Behavior: No Danger to Others: No Duty to Warn:no Physical Aggression / Violence:No  Access to Firearms a concern: No  Gang Involvement:No   Subjective: Patient today reports anxiety, some negativity, nervous, fearful thoughts, depression (has lessened).  Interventions: Solution-Oriented/Positive Psychology and Ego-Supportive  Diagnosis:   ICD-10-CM   1. Generalized anxiety disorder  F41.1      Plan: Patient not signing tx goal updates on computer screen due to [place].  Treatment Goals: Goals remain on tx plan as patient works on strategies to [employer].Progress noted each session and documented in "Progress" section of tx plan.  Long term goal:(measurable) Develop healthy cognitive patterns and beliefs about himself and the world that lead to alleviation of depression and anxiety. Overall goal is for patient to reach a point where he uses acquired anxiety/stress/depression management skills, and reports his depression and anxiety remain below a "3 on a 10 pt scale" for at least 2 months. (Currently reporting a "7" for anxiety and depression.)  Short term goal: Learn and implement personal skills for managing stress, solving daily problems, and resolving conflicts effectively.  Strategy: Teach patient calming skills, problem solving skills, conflict resolution skills, to manage daily stressors.  PROGRESS: Patient today reporting anxiety, depression (better), nervous, and fearful.  Housing situation is still unclear after his apartment complex was bought out and he feels there are a lot of "unknowns" as to what changes may occur and patient  is finding it easy to assume the "negatives" and this adds to his fear at times.  Has re-started his physical therapy to help him move around better.  That has been a positive experience as he feel treated with respect and  encouragement. Is not as tense and irritable today in session. Frustrations remain with his relationship with brother, son's GF is difficult to interact with, and his mother is difficult due to her irritability and hearing loss. Patient not had any falls since my last appt with him and balance is one of the issues that the physical therapy is addressing. Following up on some recommended strategies per this therapist and his physical therapist including stress management techniques that involve use of balls in doing some hand tension-release exercises, getting outside daily, using audiobooks, chair exercises and practicing sitting and standing trying to primarily use his legs, practicing looking for the positives versus the negatives, focusing on what he can control or change versus what he can't control/change, and take opportunities to have facetime calls with young grandson and his parents. Per goals above he is finding that some of the skills above also help patient manage stress and help his self confidence in managing daily problems as they arise.  Patient spoke up to share that he has begun journaling which can include "positives, some negatives, reactions to certain things, appts that I have coming up, my aches and pains but also my progress through PT.  Another area where patient is progressing is that when he is out, he is intentionally is speaking more to people and he is sensing that is keeping him more alert and interactive. Encouraged to continue these positive behaviors.   Goal review and progress noted with pateint.  Next session within 3 weeks.   Shanon Ace, LCSW

## 2020-04-25 ENCOUNTER — Other Ambulatory Visit: Payer: Self-pay | Admitting: Gastroenterology

## 2020-05-04 ENCOUNTER — Ambulatory Visit (INDEPENDENT_AMBULATORY_CARE_PROVIDER_SITE_OTHER): Payer: Medicare Other | Admitting: Psychiatry

## 2020-05-04 DIAGNOSIS — F411 Generalized anxiety disorder: Secondary | ICD-10-CM | POA: Diagnosis not present

## 2020-05-04 NOTE — Progress Notes (Signed)
[facility] Counselor/Therapist Progress Note  Patient ID: JEF FUTCH, MRN: [id number],    Date: 05/04/2020  Time Spent: 60 minutes  12:00noon to 1:00pm  Virtual Visit Note via Camera operator with patient by a video enabled telemedicine/telehealth application or telephone, with their informed consent, and verified patient privacy and that I am speaking with the correct person using two identifiers. I discussed the limitations, risks, security and privacy concerns of performing psychotherapy and management service by telephone and the availability of in person appointments. I also discussed with the patient that there may be a patient responsible charge related to this service. The patient expressed understanding and agreed to proceed. I discussed the treatment planning with the patient. The patient was provided an opportunity to ask questions and all were answered. The patient agreed with the plan and demonstrated an understanding of the instructions. The patient was advised to call  our office if  symptoms worsen or feel they are in a crisis state and need immediate contact.  *(We did have difficulty after connecting and starting the session, video inconsistent and then froze patient's picture on screen. Tried starting over but couldn't get different result so continued the session despite picture difficulty.  Patient and I agreed to continue session even with the frozen video because he really needed his time to talk.)  Therapist Location: [facility] Patient Location: home   Treatment Type: Individual Therapy  Reported Symptoms: anxiety, frustration, sadness  Mental Status Exam:  Appearance:   Casual     Behavior:  anxious, depressed  Motor:  Normal  Speech/Language:   Normal Rate  Affect:  anxious, some depression, frustration  Mood:  anxious, depressed and frustrated  Thought process:  goal directed  Thought content:    WNL  Sensory/Perceptual  disturbances:    WNL  Orientation:  oriented to person, place, time/date, situation, day of week, month of year and year  Attention:  Good  Concentration:  Good and Fair  Memory:  "better more recently"  Fund of knowledge:   Good  Insight:    Good and Fair  Judgment:   Good  Impulse Control:  Good   Risk Assessment: Danger to Self:  No Self-injurious Behavior: No Danger to Others: No Duty to Warn:no Physical Aggression / Violence:No  Access to Firearms a concern: No  Gang Involvement:No   Subjective: Patient today reporting anxiety, frustration, and some depression. Sleep is better.  Communication with other family living locally is still some better.  Interventions: Solution-Oriented/Positive Psychology and Ego-Supportive  Diagnosis:   ICD-10-CM   1. Generalized anxiety disorder  F41.1      Plan: Patient not signing tx goal updates on computer screen due to [place].  Treatment Goals: Goals remain on tx plan as patient works on strategies to [employer].Progress noted each session and documented in "Progress" section of tx plan.  Long term goal:(measurable) Develop healthy cognitive patterns and beliefs about himself and the world that lead to alleviation of depression and anxiety. Overall goal is for patient to reach a point where he uses acquired anxiety/stress/depression management skills, and reports his depression and anxiety remain below a "3 on a 10 pt scale" for at least 2 months. (Currently reporting a "7" for anxiety and depression.)  Short term goal: Learn and implement personal skills for managing stress, solving daily problems, and resolving conflicts effectively.  Strategy: Teach patient calming skills, problem solving skills, conflict resolution skills, to manage daily stressors.  PROGRESS:  Patient today reporting anxiety, frustrations, and some depression. Some sadness "as it looks like multiple people that I've known over the years and  through the church I was attending, have died in past couple weeks since last appt.. Processed his sadness and grief issues connected with these deaths. Has gotten some more info about his housing situation and his anxiety about that has calmed some, although he still doesn't have a lot of information. Has tried not to let "the unknowns" automatically turn into "the negatives", and instead keep an open mind until he has more info in situations that impact him.  No more falls since last appt and feels he is some steadier on his feet. To continue stress and anxiety management with the help of his "stress balls" and exercise bands to help not only physically but also help emotionally with anxiety/stress.  Other strategies that he has used with some success include listening to audiobooks, getting outside daily, stability exercises, chair exercises, and intentionally focusing on positives versus negatives. Encouraged him to get outside more as he can, continue his journaling, and to have contact with others as often as he can to be more engaged, alert, and interactive.  Goal review and progress/challenges/efforts noted with patient.  Next appt  Within 3 weeks.   Shanon Ace, LCSW

## 2020-05-18 ENCOUNTER — Ambulatory Visit: Payer: Medicare Other | Admitting: Psychiatry

## 2020-05-30 ENCOUNTER — Encounter: Payer: Self-pay | Admitting: Psychiatry

## 2020-05-30 ENCOUNTER — Telehealth (INDEPENDENT_AMBULATORY_CARE_PROVIDER_SITE_OTHER): Payer: Medicare Other | Admitting: Psychiatry

## 2020-05-30 DIAGNOSIS — F411 Generalized anxiety disorder: Secondary | ICD-10-CM

## 2020-05-30 DIAGNOSIS — F331 Major depressive disorder, recurrent, moderate: Secondary | ICD-10-CM

## 2020-05-30 DIAGNOSIS — F5101 Primary insomnia: Secondary | ICD-10-CM | POA: Diagnosis not present

## 2020-05-30 MED ORDER — BUPROPION HCL ER (XL) 300 MG PO TB24: 300.0000 mg | ORAL_TABLET | Freq: Every day | ORAL | 0 refills | Status: DC

## 2020-05-30 NOTE — Progress Notes (Signed)
Sergio Griffin [id number] [birth year] 69 y.o.  Virtual Visit via Video Note  I connected with pt @ on 05/30/20 at  9:30 AM EDT by a video enabled telemedicine application and verified that I am speaking with the correct person using two identifiers.   I discussed the limitations of evaluation and management by telemedicine and the availability of in person appointments. The patient expressed understanding and agreed to proceed.  I discussed the assessment and treatment plan with the patient. The patient was provided an opportunity to ask questions and all were answered. The patient agreed with the plan and demonstrated an understanding of the instructions.   The patient was advised to call back or seek an in-person evaluation if the symptoms worsen or if the condition fails to improve as anticipated.  I provided 30 minutes of non-face-to-face time during this encounter.  The patient was located at home.  The provider was located at [place].   Thayer Headings, PMHNP   Subjective:   Patient ID:  Sergio Griffin is a 69 y.o. (DOB 1951-02-16) male.  Chief Complaint:  Chief Complaint  Patient presents with  . Anxiety  . Follow-up    h/o Depression    HPI Sergio Griffin presents for follow-up of anxiety, depression, and insomnia. He noticed some blurred vision with Buspar. He reports that he has felt more calm since taking Buspar. He noticed that his vision improved after reducing and then discontinuing dose of Buspar. He reports less muscle tension, to include no longer having clenched fists or jaw tightness. Denies irritability. He reports that he has been distancing from some family members that he has had conflict with. He reports that he has been sleeping well. Denies difficulty falling asleep. He reports that he occasionally drifts back off to sleep after awakening in the morning and thinks that morning medications cause some drowsiness. "If anything, I feel like I am  sleeping too much." He does not think Buspar contributed to drowsiness. He reports low energy and difficulty getting going at times despite wanting to accomplish several things on his list. He reports that he has been trying to increase stamina. He reports that his appetite has been low, especially in the morning. He reports that depression has been "pretty level." He reports some sadness in response to some family situations. He reports that he has been able to read again since stopping Buspar. Denies SI.   Reports that he has not been able to see his son as frequently.    Past Psychiatric Medication Trials: Prozac- Adverse effects Wellbutrin- helpful for depression Celexa- Affective dulling Lexapro Imipramine- Helpful for anxiety, worsened glaucoma Klonopin- effective Ambien-ineffective.  Review of Systems:  Review of Systems  Eyes: Negative for visual disturbance.  Gastrointestinal: Positive for abdominal distention.       Early satiety  Musculoskeletal: Negative for gait problem.  Psychiatric/Behavioral:       Please refer to HPI    Medications: I have reviewed the patient's current medications.  Current Outpatient Medications  Medication Sig Dispense Refill  . acetaminophen (TYLENOL) 500 MG tablet Take 500-1,000 mg by mouth every 6 (six) hours as needed for moderate pain or headache.    Marland Kitchen amLODipine (NORVASC) 5 MG tablet Take 5 mg by mouth daily.    Marland Kitchen aspirin EC 81 MG tablet Take 81 mg by mouth at bedtime.     Marland Kitchen buPROPion (WELLBUTRIN XL) 300 MG 24 hr tablet Take 1 tablet (300 mg total) by mouth daily. [place]  tablet 0  . busPIRone (BUSPAR) 15 MG tablet Take 1/3 tablet p.o. twice daily for 1 week, then take 2/3 tablet p.o. twice daily for 1 week, then take 1 tablet p.o. twice daily 60 tablet 1  . Carboxymethylcellul-Glycerin (LUBRICATING EYE DROPS OP) Place 1 drop into both eyes daily as needed (dry eyes).    . clonazePAM (KLONOPIN) 0.5 MG tablet Take 1 tablet (0.5 mg total) by  mouth 2 (two) times daily as needed for anxiety. 180 tablet 1  . DM-APAP-CPM (CORICIDIN HBP PO) Take 1 tablet by mouth daily as needed (severe allergies).    . furosemide (LASIX) 20 MG tablet Take 20 mg by mouth daily.     Marland Kitchen latanoprost (XALATAN) 0.005 % ophthalmic solution Place 1 drop into both eyes at bedtime.    Marland Kitchen losartan (COZAAR) 50 MG tablet Take 100 mg by mouth daily.     . metoprolol succinate (TOPROL-XL) 25 MG 24 hr tablet Take 12.5-25 mg by mouth See admin instructions. Take 25 mg in the morning and 12.5 mg in the evening    . metroNIDAZOLE (FLAGYL) 500 MG tablet Take 1 tablet (500 mg total) by mouth 2 (two) times daily. 10 tablet 0  . nitroGLYCERIN (NITROSTAT) 0.4 MG SL tablet Place 1 tablet (0.4 mg total) under the tongue every 5 (five) minutes x 3 doses as needed for chest pain. 30 tablet 1  . Omega-3 Fatty Acids (FISH OIL) 1000 MG CAPS Take 1,000 mg by mouth daily.    . ondansetron (ZOFRAN) 4 MG tablet Take 4 mg by mouth every 8 (eight) hours as needed for nausea or vomiting.    . ondansetron (ZOFRAN-ODT) 4 MG disintegrating tablet DISSOLVE 1 TABLET IN MOUTH EVERY 8 HOURS AS NEEDED FOR NAUSEA FOR VOMITING 30 tablet 0  . phenazopyridine (PYRIDIUM) 200 MG tablet Take 1 tablet (200 mg total) by mouth 3 (three) times daily as needed for pain. 10 tablet 0  . prasugrel (EFFIENT) 10 MG TABS tablet Take 1 tablet (10 mg total) by mouth at bedtime. Resume 48 hours after surgery 30 tablet   . rosuvastatin (CRESTOR) 40 MG tablet Take 40 mg by mouth every evening.     . sucralfate (CARAFATE) 1 g tablet Take 1 tablet (1 g total) by mouth 2 (two) times daily. As needed (Patient taking differently: Take 1 g by mouth at bedtime. ) 60 tablet 6  . traMADol (ULTRAM) 50 MG tablet Take 1-2 tablets (50-100 mg total) by mouth every 6 (six) hours as needed for moderate pain. 15 tablet 0  . TRULANCE 3 MG TABS Take 1 tablet by mouth once daily 30 tablet 0   No current facility-administered medications for this  visit.    Medication Side Effects: None  Allergies:  Allergies  Allergen Reactions  . Ibuprofen Diarrhea and Nausea Only  . Propoxyphene Itching    Darvocet  . Sulfa Antibiotics Itching    Past Medical History:  Diagnosis Date  . Anxiety   . Barrett's esophagus   . CAD (coronary artery disease)    PCI, stent 2011  . Cardiac arrhythmia   . Chicken pox   . Depression   . Diverticulosis   . Gastric ulcer   . GERD (gastroesophageal reflux disease)   . Glaucoma   . Heart attack ([place]) 01/29/2019  . History of myocardial infarction   . Hyperlipidemia   . Hypertension   . Kidney stones   . Kidney stones   . Pneumonia 11/2014  . Prostatitis   .  Reflux   . Sleep apnea   . UTI (lower urinary tract infection)     Family History  Problem Relation Age of Onset  . Arthritis Mother   . Hyperlipidemia Mother   . Stroke Mother   . Hypertension Mother   . Ovarian cancer Mother   . Hyperlipidemia Father   . Heart disease Father   . Hypertension Father   . Non-Hodgkin's lymphoma Father   . Hyperlipidemia Maternal Grandmother   . Heart disease Maternal Grandmother   . Stroke Maternal Grandmother   . Hypertension Maternal Grandmother   . Diabetes Maternal Grandmother   . Hyperlipidemia Maternal Grandfather   . Heart disease Maternal Grandfather   . Hypertension Maternal Grandfather   . Heart disease Paternal Grandmother   . Heart disease Paternal Grandfather   . Stroke Paternal Grandfather   . Hypertension Paternal Grandfather   . Congestive Heart Failure Brother   . Colon cancer Neg Hx   . Esophageal cancer Neg Hx   . Pancreatic cancer Neg Hx   . Stomach cancer Neg Hx   . Liver disease Neg Hx     Social History   Socioeconomic History  . Marital status: Divorced    Spouse name: Not on file  . Number of children: 2  . Years of education: 64  . Highest education level: Not on file  Occupational History  . Occupation: Estate manager/land agent    Comment: Retired   Tobacco Use  . Smoking status: Former Smoker    Types: Cigarettes  . Smokeless tobacco: Never Used  . Tobacco comment: quit 1987  Vaping Use  . Vaping Use: Never used  Substance and Sexual Activity  . Alcohol use: No  . Drug use: No  . Sexual activity: Not Currently    Partners: Female  Other Topics Concern  . Not on file  Social History Narrative   Fun: Paint, cook, travel   Denies religious beliefs effecting health care.    Social Determinants of Health   Financial Resource Strain:   . Difficulty of Paying Living Expenses:   Food Insecurity:   . Worried About Charity fundraiser in the Last Year:   . Arboriculturist in the Last Year:   Transportation Needs:   . Film/video editor (Medical):   Marland Kitchen Lack of Transportation (Non-Medical):   Physical Activity:   . Days of Exercise per Week:   . Minutes of Exercise per Session:   Stress:   . Feeling of Stress :   Social Connections:   . Frequency of Communication with Friends and Family:   . Frequency of Social Gatherings with Friends and Family:   . Attends Religious Services:   . Active Member of Clubs or Organizations:   . Attends Archivist Meetings:   Marland Kitchen Marital Status:   Intimate Partner Violence:   . Fear of Current or Ex-Partner:   . Emotionally Abused:   Marland Kitchen Physically Abused:   . Sexually Abused:     Past Medical History, Surgical history, Social history, and Family history were reviewed and updated as appropriate.   Please see review of systems for further details on the patient's review from today.   Objective:   Physical Exam:  There were no vitals taken for this visit.  Physical Exam Neurological:     Mental Status: He is alert and oriented to person, place, and time.     Cranial Nerves: No dysarthria.  Psychiatric:  Attention and Perception: Attention and perception normal.        Speech: Speech normal.        Behavior: Behavior is cooperative.        Thought Content: Thought  content normal. Thought content is not paranoid or delusional. Thought content does not include homicidal or suicidal ideation. Thought content does not include homicidal or suicidal plan.        Cognition and Memory: Cognition and memory normal.        Judgment: Judgment normal.     Comments: Insight intact Presents as less anxious compared to last exam.      Lab Review:     Component Value Date/Time   NA 136 10/20/2019 1419   K 4.7 10/20/2019 1419   CL 102 10/20/2019 1419   CO2 26 10/20/2019 1419   GLUCOSE 149 (H) 10/20/2019 1419   BUN 36 (H) 10/20/2019 1419   CREATININE 2.36 (H) 10/20/2019 1419   CALCIUM 9.0 10/20/2019 1419   PROT 6.9 10/07/2019 1055   ALBUMIN 4.1 10/07/2019 1055   AST 17 10/07/2019 1055   ALT 24 10/07/2019 1055   ALKPHOS 47 10/07/2019 1055   BILITOT 0.5 10/07/2019 1055   GFRNONAA 27 (L) 10/20/2019 1419   GFRAA 31 (L) 10/20/2019 1419       Component Value Date/Time   WBC 11.8 (H) 10/20/2019 1419   RBC 5.02 10/20/2019 1419   HGB 14.6 10/20/2019 1419   HCT 46.9 10/20/2019 1419   PLT 284 10/20/2019 1419   MCV 93.4 10/20/2019 1419   MCH 29.1 10/20/2019 1419   MCHC 31.1 10/20/2019 1419   RDW 13.5 10/20/2019 1419   LYMPHSABS 2.7 10/07/2019 1055   MONOABS 1.0 10/07/2019 1055   EOSABS 0.3 10/07/2019 1055   BASOSABS 0.2 (H) 10/07/2019 1055    No results found for: POCLITH, LITHIUM   No results found for: PHENYTOIN, PHENOBARB, VALPROATE, CBMZ   .res Assessment: Plan:   Recommend discussing Buspar with opthamologist to determine if BuSpar could potentially be increasing intraocular pressure. Will resume trial of Buspar 5 mg po BID for one week, then increase to 10 mg po BID since this dose may have been helpful for his anxiety. Discussed that 7.5 mg po BID may also be a possibility if unable to tolerate 10 mg po BID. patient advised to contact office if he has questions about how to adjust dose based on response/tolerability. Continue Wellbutrin XL 300  mg daily for depression. Continue Klonopin 0.5 mg twice daily as needed for anxiety. Recommend continuing psychotherapy with Rinaldo Cloud, LCSW. Patient follow-up in 6 weeks or sooner if clinically indicated. Patient advised to contact office with any questions, adverse effects, or acute worsening in signs and symptoms.   Please see After Visit Summary for patient specific instructions.  Future Appointments  Date Time Provider [place]  06/01/2020 10:00 AM Shanon Ace, LCSW [facility] None  06/29/2020 10:00 AM Shanon Ace, LCSW [facility] None    No orders of the defined types were placed in this encounter.     -------------------------------

## 2020-06-01 ENCOUNTER — Ambulatory Visit (INDEPENDENT_AMBULATORY_CARE_PROVIDER_SITE_OTHER): Payer: Medicare Other | Admitting: Psychiatry

## 2020-06-01 DIAGNOSIS — F411 Generalized anxiety disorder: Secondary | ICD-10-CM | POA: Diagnosis not present

## 2020-06-01 NOTE — Progress Notes (Signed)
[facility] Counselor/Therapist Progress Note  Patient ID: Sergio Griffin, MRN: [id number],    Date: 06/01/2020  Time Spent: 60 minutes   10:00am to 11:00am  Virtual Visit Note via [place] with patient by a video enabled telemedicine/telehealth application or telephone, with their informed consent, and verified patient privacy and that I am speaking with the correct person using two identifiers. I discussed the limitations, risks, security and privacy concerns of performing psychotherapy and management service by telephone and the availability of in person appointments. I also discussed with the patient that there may be a patient responsible charge related to this service. The patient expressed understanding and agreed to proceed. I discussed the treatment planning with the patient. The patient was provided an opportunity to ask questions and all were answered. The patient agreed with the plan and demonstrated an understanding of the instructions. The patient was advised to call  our office if  symptoms worsen or feel they are in a crisis state and need immediate contact.   Therapist Location: [facility] Patient Location: home   Treatment Type: Individual Therapy  Reported Symptoms: anxiety, frustration, anger," sleeping enough but still tired"  Mental Status Exam:  Appearance:   Casual     Behavior:  Appropriate and Sharing  Motor:  Normal  Speech/Language:   Normal Rate  Affect:  anxious, depressed  Mood:  anxious  Thought process:  goal directed  Thought content:    WNL  Sensory/Perceptual disturbances:    WNL  Orientation:  oriented to person, place, time/date, situation, day of week, month of year and year  Attention:  Good  Concentration:  Good  Memory:  "some forgetfulness"  Fund of knowledge:   Good  Insight:    Fair  Judgment:   Good and Fair  Impulse Control:  Good   Risk Assessment: Danger to Self:  No Self-injurious Behavior:  No Danger to Others: No Duty to Warn:no Physical Aggression / Violence:No  Access to Firearms a concern: No  Gang Involvement:No   Subjective: Patient today reporting anxiety to be his "main symptoms".  Also experiencing some depression, frustration, anger, and sleeping a lot but still tired.     Interventions: Solution-Oriented/Positive Psychology and Ego-Supportive  Diagnosis:   ICD-10-CM   1. Generalized anxiety disorder  F41.1     Plan: Patient not signing tx goal updates on computer screen due to [place].  Treatment Goals: Goals remain on tx plan as patient works on strategies to [employer].Progress noted each session and documented in "Progress" section of tx plan.  Long term goal:(measurable) Develop healthy cognitive patterns and beliefs about himself and the world that lead to alleviation of depression and anxiety. Overall goal is for patient to reach a point where he usesacquired anxiety/stress/depression management skills, and reports his depression and anxiety remain below a "3 on a 10 pt scale" for at least 2 months. (Currently reporting a "7" for anxiety and depression.)  Short term goal: Learn and implement personal skills for managing stress, solving daily problems, and resolving conflicts effectively.  Strategy: Teach patient calming skills, problem solving skills, conflict resolution skills, to manage daily stressors.  PROGRESS: Patient today states his anxiety is his main symptom, along with depression, anger, frustrations, and sleeping too much at times. Has not been quite as physically mobile as he was at last visit but states today he's going to try and do more of the moving about. Reports he has not fallen anymore since last  appt and his back is better.He has stopped PT for now and the decision seems to be mutual between him and PT staff, per patient .  He says he can request to return if he feels he needs to do so. Anxiety is mostly related to  family situations and the dynamics involved, not being able to do as much as I used to be able to do, health concerns, and isolation. Discussed what he can control and what he cannot control in each of the above mentioned triggers. Encouraged him in his progress on goal-directed behaviors above including self-calming, problem solving in some situations more effectively, communication especially in midst of stress or potential conflict, and some improvement of management of daily stressors. Also encouraged to continue his journaling and try to get outside some each day when possible.  Goal review and progress/challenges noted with patient.  Next appt within 2-3 weeks.   Shanon Ace, LCSW

## 2020-06-15 ENCOUNTER — Ambulatory Visit: Payer: Medicare Other | Admitting: Psychiatry

## 2020-06-29 ENCOUNTER — Ambulatory Visit (INDEPENDENT_AMBULATORY_CARE_PROVIDER_SITE_OTHER): Payer: Medicare Other | Admitting: Psychiatry

## 2020-06-29 DIAGNOSIS — F411 Generalized anxiety disorder: Secondary | ICD-10-CM

## 2020-06-29 NOTE — Progress Notes (Signed)
[facility] Counselor/Therapist Progress Note  Patient ID: Sergio Griffin, MRN: [id number],    Date: 06/29/2020  Time Spent: 60 minutes  10:00am to 11:00am  Virtual Visit Note via [place] with patient by a video enabled telemedicine/telehealth application or telephone, with their informed consent, and verified patient privacy and that I am speaking with the correct person using two identifiers. I discussed the limitations, risks, security and privacy concerns of performing psychotherapy and management service by telephone and the availability of in person appointments. I also discussed with the patient that there may be a patient responsible charge related to this service. The patient expressed understanding and agreed to proceed. I discussed the treatment planning with the patient. The patient was provided an opportunity to ask questions and all were answered. The patient agreed with the plan and demonstrated an understanding of the instructions. The patient was advised to call  our office if  symptoms worsen or feel they are in a crisis state and need immediate contact.   Therapist Location: [facility] Patient Location: home   Treatment Type: Individual Therapy  Reported Symptoms: anxiety, depression, some family conflict but patient not directly involved although is affected. Mental Status Exam:  Appearance:   Casual     Behavior:  Appropriate and Sharing  Motor:  Normal  Speech/Language:   Clear and Coherent  Affect:  anxious, depressed  Mood:  anxious and depressed  Thought process:  goal directed  Thought content:    WNL and some rumination  Sensory/Perceptual disturbances:    WNL  Orientation:  oriented to person, place, time/date, situation, day of week, month of year and year  Attention:  Good  Concentration:  Good and Fair  Memory:  "not really much memory issues, but occasionally forget a name"  Fund of knowledge:   Good  Insight:    Good and  Fair  Judgment:   Good  Impulse Control:  Good   Risk Assessment: Danger to Self:  No Self-injurious Behavior: No Danger to Others: No Duty to Warn:no Physical Aggression / Violence:No  Access to Firearms a concern: No  Gang Involvement:No   Subjective: Patient today reports anxiety and depression, but some decrease in anxiety. "But I noticed more recently feeling weepy again when it gets triggered by certain music or pictures, etc.  Interventions: Solution-Oriented/Positive Psychology and Ego-Supportive  Diagnosis:   ICD-10-CM   1. Generalized anxiety disorder  F41.1     Plan: Patient not signing tx goal updates on computer screen due to [place].  Treatment Goals: Goals remain on tx plan as patient works on strategies to [employer].Progress noted each session and documented in "Progress" section of tx plan.  Long term goal:(measurable) Develop healthy cognitive patterns and beliefs about himself and the world that lead to alleviation of depression and anxiety. Overall goal is for patient to reach a point where he usesacquired anxiety/stress/depression management skills, and reports his depression and anxiety remain below a "3 on a 10 pt scale" for at least 2 months. (Currently reporting a "7" for anxiety and depression.)  Short term goal: Learn and implement personal skills for managing stress, solving daily problems, and resolving conflicts effectively.  Strategy: Teach patient calming skills, problem solving skills, conflict resolution skills, to manage daily stressors.  PROGRESS: Patient today reports anxiety and depression.  Thinks the Buspar has helped some with his anxiety so it is some less. Family issues and some conflict which patient processes today as it affects him  but he seems to be setting some boundaries as needed. States "I'm forgetting names at times and I think a need more stimulation" which I also encouraged and we discussed ways that can  happen including him reaching out to a few people, doing some "mental challenges" such as crossword puzzles, and talking with other people. Patient recently connected with a 2nd cousin and states that will be another person with which he can be in touch.  Also discussed current health habits that aren't working well for him and he wants to get back on a low-carb diet that he used previously with benefits in weight loss, and felt better mentally and physically. Encouraged patient in these proactive measures, in addition to staying in the present versus jumping into the future and worrying, continuing his focus on goal-directed behaviors especially the ones that are self-calming, managing daily stressors, and using improved communication skills especially in family interactions.        Goal review and progress noted with patient.  Next appt within 3-4 weeks.   Shanon Ace, LCSW

## 2020-07-07 DIAGNOSIS — L218 Other seborrheic dermatitis: Secondary | ICD-10-CM | POA: Diagnosis not present

## 2020-07-07 DIAGNOSIS — L853 Xerosis cutis: Secondary | ICD-10-CM | POA: Diagnosis not present

## 2020-07-07 DIAGNOSIS — L570 Actinic keratosis: Secondary | ICD-10-CM | POA: Diagnosis not present

## 2020-07-07 DIAGNOSIS — L814 Other melanin hyperpigmentation: Secondary | ICD-10-CM | POA: Diagnosis not present

## 2020-07-07 DIAGNOSIS — B354 Tinea corporis: Secondary | ICD-10-CM | POA: Diagnosis not present

## 2020-07-07 DIAGNOSIS — L720 Epidermal cyst: Secondary | ICD-10-CM | POA: Diagnosis not present

## 2020-07-08 ENCOUNTER — Other Ambulatory Visit: Payer: Self-pay | Admitting: Psychiatry

## 2020-07-08 DIAGNOSIS — F331 Major depressive disorder, recurrent, moderate: Secondary | ICD-10-CM

## 2020-07-13 ENCOUNTER — Ambulatory Visit: Payer: Medicare Other | Admitting: Psychiatry

## 2020-07-18 DIAGNOSIS — N410 Acute prostatitis: Secondary | ICD-10-CM | POA: Diagnosis not present

## 2020-08-02 ENCOUNTER — Ambulatory Visit (INDEPENDENT_AMBULATORY_CARE_PROVIDER_SITE_OTHER): Payer: Medicare Other | Admitting: Psychiatry

## 2020-08-02 DIAGNOSIS — F411 Generalized anxiety disorder: Secondary | ICD-10-CM

## 2020-08-02 NOTE — Progress Notes (Signed)
[facility] Counselor/Therapist Progress Note  Patient ID: Sergio Griffin, MRN: [id number],    Date: 08/02/2020  Time Spent: 60 minutes   11:00am to 12:00noon  Virtual Visit Note via [name] with patient by a video enabled telemedicine/telehealth application or telephone, with their informed consent, and verified patient privacy and that I am speaking with the correct person using two identifiers. I discussed the limitations, risks, security and privacy concerns of performing psychotherapy and management service by telephone and the availability of in person appointments. I also discussed with the patient that there may be a patient responsible charge related to this service. The patient expressed understanding and agreed to proceed. I discussed the treatment planning with the patient. The patient was provided an opportunity to ask questions and all were answered. The patient agreed with the plan and demonstrated an understanding of the instructions. The patient was advised to call  our office if  symptoms worsen or feel they are in a crisis state and need immediate contact.   Therapist Location: [facility] Patient Location: home   Treatment Type: Individual Therapy  Reported Symptoms: anxiety, fear, some depression, some tearfulness twice during session  Mental Status Exam:  Appearance:   Casual     Behavior:  Appropriate, Sharing and Motivated  Motor:  Normal  Speech/Language:   Clear and Coherent  Affect:  anxious, some tearfulness a couple times during conversation  Mood:  anxious and some depression, some tearfulness  Thought process:  goal directed  Thought content:    WNL  Sensory/Perceptual disturbances:    WNL  Orientation:  oriented to person, place, time/date, situation, day of week, month of year and year  Attention:  Good  Concentration:  Fair  Memory:  some forgetfulness  Fund of knowledge:   Good  Insight:    Good  Judgment:   Good   Impulse Control:  Good   Risk Assessment: Danger to Self:  No Self-injurious Behavior: No Danger to Others: No Duty to Warn:no Physical Aggression / Violence:No  Access to Firearms a concern: No  Gang Involvement:No   Subjective: Patient today reports anxiety as his "main symptom", along with some depression, and having some physical issues "checked out" including urological issues and some cardiac symptoms that are being evaluated.   Interventions: Solution-Oriented/Positive Psychology and Ego-Supportive  Diagnosis:   ICD-10-CM   1. Generalized anxiety disorder  F41.1     Plan: Patient not signing tx goal updates on computer screen due to [place].  Treatment Goals: Goals remain on tx plan as patient works on strategies to [employer].Progress noted each session and documented in "Progress" section of tx plan.  Long term goal:(measurable) Develop healthy cognitive patterns and beliefs about himself and the world that lead to alleviation of depression and anxiety.Include physical movement daily as possible including walking. Overall goal is for patient to reach a point where he usesacquired anxiety/stress/depression management skills, and reports his depression and anxiety remain below a "3 on a 10 pt scale" for at least 2 months. (Currently reporting a "7" for anxiety and depression.)  Short term goal: Learn and implement personal skills for managing stress, solving daily problems, and resolving conflicts effectively.  Strategy: Teach patient calming skills, problem solving skills, conflict resolution skills, to manage daily stressors.  PROGRESS: Patient in today reporting anxiety "as my main symptom".  Also some depression and  Teary a couple times during session today. Having "some physical issues checked out including urological, cardiac, and  some breathing symptoms. States he is definitely following up with doctors about these symptoms. Family issues remain  a concern, but less conflict. Anxiety is elevated by these concerns and we worked today specifically about his anxious thoughts and working to more quickly identify them and challenge them, and then replace those thoughts with more positive, reality-based, and empowering thoughts and self-talk which does not support anxious thought patterns. (per Long and Short term goals above in tx plan.) Also working on more of a "low carb diet" and limiting sugar intake to be more mindful of better nutritional practices. Has followed up on his concerns last session with his forgetting names at times and feeling he needed more stimulation mentally.  Staying in touch with other people, doing crossword puzzles, and continued contact with a 2nd cousin. Feels his forgetfulness has declined, and the mental stimulation of puzzles and the connection to other people has been helpful.  Goal review and progress/challenges noted with patient.  Next appt within 3-4 weeks.   Shanon Ace, LCSW

## 2020-08-09 ENCOUNTER — Other Ambulatory Visit: Payer: Self-pay

## 2020-08-09 DIAGNOSIS — F410 Panic disorder [episodic paroxysmal anxiety] without agoraphobia: Secondary | ICD-10-CM

## 2020-08-09 DIAGNOSIS — F411 Generalized anxiety disorder: Secondary | ICD-10-CM

## 2020-08-09 MED ORDER — BUSPIRONE HCL 15 MG PO TABS: ORAL_TABLET | ORAL | 1 refills | Status: DC

## 2020-08-24 ENCOUNTER — Telehealth (INDEPENDENT_AMBULATORY_CARE_PROVIDER_SITE_OTHER): Payer: Medicare Other | Admitting: Psychiatry

## 2020-08-24 ENCOUNTER — Encounter: Payer: Self-pay | Admitting: Psychiatry

## 2020-08-24 DIAGNOSIS — F331 Major depressive disorder, recurrent, moderate: Secondary | ICD-10-CM

## 2020-08-24 DIAGNOSIS — F410 Panic disorder [episodic paroxysmal anxiety] without agoraphobia: Secondary | ICD-10-CM

## 2020-08-24 DIAGNOSIS — F411 Generalized anxiety disorder: Secondary | ICD-10-CM | POA: Diagnosis not present

## 2020-08-24 MED ORDER — BUSPIRONE HCL 15 MG PO TABS: ORAL_TABLET | ORAL | 1 refills | Status: DC

## 2020-08-24 MED ORDER — BUPROPION HCL ER (XL) 300 MG PO TB24: 300.0000 mg | ORAL_TABLET | Freq: Every day | ORAL | 1 refills | Status: DC

## 2020-08-24 NOTE — Progress Notes (Signed)
Sergio Griffin [id number] [birth year] 69 y.o.  Virtual Visit via Video Note  I connected with pt @ on 08/24/20 at  9:00 AM EDT by a video enabled telemedicine application and verified that I am speaking with the correct person using two identifiers.   I discussed the limitations of evaluation and management by telemedicine and the availability of in person appointments. The patient expressed understanding and agreed to proceed.  I discussed the assessment and treatment plan with the patient. The patient was provided an opportunity to ask questions and all were answered. The patient agreed with the plan and demonstrated an understanding of the instructions.   The patient was advised to call back or seek an in-person evaluation if the symptoms worsen or if the condition fails to improve as anticipated.  I provided 30 minutes of non-face-to-face time during this encounter.  The patient was located at home.  The provider was located at [place].   Sergio Griffin, PMHNP   Subjective:   Patient ID:  Sergio Griffin is a 69 y.o. (DOB [birth year]) male.  Chief Complaint:  Chief Complaint  Patient presents with  . Follow-up    anxiety, depression, and insomnia    HPI Sergio Griffin presents for follow-up of depression, anxiety, and insomnia. He reports some improvements with Buspar and that he is "not as tense as I was." He reports that he has had some difficulty swallowing and that his throat feels tight and this causes some anxiety since he had similar s/s prior to having stents placed. He reports that he was having some anxiety re: his BP and that orthostasis has improved since Tamsulosin has been discontinued. He reports that energy has improved at times since Tamsulosin was discontinued. Reports that he is "always tired." He reports that he has been trying to have more positive thoughts and breaking down tasks into smaller steps. He reports that depression has been slightly  improved, aside from periodic worsening in response to family stressors. He reports that he has felt less isolated due to some increased social interaction. He reports sleeping "too much." Appetite has been less. He reports that low energy and motivation prevents him from preparing foods at time. Concentration has been improved. He has been trying to recall all the names and faces from people that he has known in the past. Denies SI.   He reports that Buspar causes some sleepiness and will adjust administration time around outings.  He reports that his apartment complex was bought out and there have been some recent changes. He reports that he has been trying to set boundaries with his mother and brother.   Past Psychiatric Medication Trials: Prozac- Adverse effects Wellbutrin- helpful for depression Celexa- Affective dulling Lexapro Imipramine- Helpful for anxiety, worsened glaucoma Klonopin- effective Ambien-ineffective.  Review of Systems:  Review of Systems  HENT: Positive for trouble swallowing.   Respiratory: Positive for shortness of breath.   Musculoskeletal: Negative for gait problem.  Psychiatric/Behavioral:       Please refer to HPI    Medications: I have reviewed the patient's current medications.  Current Outpatient Medications  Medication Sig Dispense Refill  . amLODipine (NORVASC) 5 MG tablet Take 5 mg by mouth daily.    . busPIRone (BUSPAR) 15 MG tablet Take 2/3-1 tab po q am and 1 tab po QHS 180 tablet 1  . metoprolol succinate (TOPROL-XL) 25 MG 24 hr tablet Take 12.5-25 mg by mouth See admin instructions. Take 25 mg in the morning  and 12.5 mg in the evening    . prasugrel (EFFIENT) 10 MG TABS tablet Take 1 tablet (10 mg total) by mouth at bedtime. Resume 48 hours after surgery 30 tablet   . acetaminophen (TYLENOL) 500 MG tablet Take 500-1,000 mg by mouth every 6 (six) hours as needed for moderate pain or headache.    Marland Kitchen aspirin EC 81 MG tablet Take 81 mg by mouth at  bedtime.     Marland Kitchen buPROPion (WELLBUTRIN XL) 300 MG 24 hr tablet Take 1 tablet (300 mg total) by mouth daily. 90 tablet 1  . Carboxymethylcellul-Glycerin (LUBRICATING EYE DROPS OP) Place 1 drop into both eyes daily as needed (dry eyes).    . clonazePAM (KLONOPIN) 0.5 MG tablet Take 1 tablet (0.5 mg total) by mouth 2 (two) times daily as needed for anxiety. 180 tablet 1  . DM-APAP-CPM (CORICIDIN HBP PO) Take 1 tablet by mouth daily as needed (severe allergies).    . furosemide (LASIX) 20 MG tablet Take 20 mg by mouth daily.     Marland Kitchen latanoprost (XALATAN) 0.005 % ophthalmic solution Place 1 drop into both eyes at bedtime.    Marland Kitchen losartan (COZAAR) 50 MG tablet Take 100 mg by mouth daily.     . metroNIDAZOLE (FLAGYL) 500 MG tablet Take 1 tablet (500 mg total) by mouth 2 (two) times daily. 10 tablet 0  . nitroGLYCERIN (NITROSTAT) 0.4 MG SL tablet Place 1 tablet (0.4 mg total) under the tongue every 5 (five) minutes x 3 doses as needed for chest pain. 30 tablet 1  . Omega-3 Fatty Acids (FISH OIL) 1000 MG CAPS Take 1,000 mg by mouth daily.    . ondansetron (ZOFRAN) 4 MG tablet Take 4 mg by mouth every 8 (eight) hours as needed for nausea or vomiting.    . ondansetron (ZOFRAN-ODT) 4 MG disintegrating tablet DISSOLVE 1 TABLET IN MOUTH EVERY 8 HOURS AS NEEDED FOR NAUSEA FOR VOMITING 30 tablet 0  . phenazopyridine (PYRIDIUM) 200 MG tablet Take 1 tablet (200 mg total) by mouth 3 (three) times daily as needed for pain. 10 tablet 0  . rosuvastatin (CRESTOR) 40 MG tablet Take 40 mg by mouth every evening.     . sucralfate (CARAFATE) 1 g tablet Take 1 tablet (1 g total) by mouth 2 (two) times daily. As needed (Patient taking differently: Take 1 g by mouth at bedtime. ) 60 tablet 6  . traMADol (ULTRAM) 50 MG tablet Take 1-2 tablets (50-100 mg total) by mouth every 6 (six) hours as needed for moderate pain. 15 tablet 0  . TRULANCE 3 MG TABS Take 1 tablet by mouth once daily 30 tablet 0   No current facility-administered  medications for this visit.    Medication Side Effects: Sedation  Allergies:  Allergies  Allergen Reactions  . Ibuprofen Diarrhea and Nausea Only  . Propoxyphene Itching    Darvocet  . Sulfa Antibiotics Itching    Past Medical History:  Diagnosis Date  . Anxiety   . Barrett's esophagus   . CAD (coronary artery disease)    PCI, stent 2011  . Cardiac arrhythmia   . Chicken pox   . Depression   . Diverticulosis   . Gastric ulcer   . GERD (gastroesophageal reflux disease)   . Glaucoma   . Heart attack ([place]) 01/29/2019  . History of myocardial infarction   . Hyperlipidemia   . Hypertension   . Kidney stones   . Kidney stones   . Pneumonia 11/2014  .  Prostatitis   . Reflux   . Sleep apnea   . UTI (lower urinary tract infection)     Family History  Problem Relation Age of Onset  . Arthritis Mother   . Hyperlipidemia Mother   . Stroke Mother   . Hypertension Mother   . Ovarian cancer Mother   . Hyperlipidemia Father   . Heart disease Father   . Hypertension Father   . Non-Hodgkin's lymphoma Father   . Hyperlipidemia Maternal Grandmother   . Heart disease Maternal Grandmother   . Stroke Maternal Grandmother   . Hypertension Maternal Grandmother   . Diabetes Maternal Grandmother   . Hyperlipidemia Maternal Grandfather   . Heart disease Maternal Grandfather   . Hypertension Maternal Grandfather   . Heart disease Paternal Grandmother   . Heart disease Paternal Grandfather   . Stroke Paternal Grandfather   . Hypertension Paternal Grandfather   . Congestive Heart Failure Brother   . Colon cancer Neg Hx   . Esophageal cancer Neg Hx   . Pancreatic cancer Neg Hx   . Stomach cancer Neg Hx   . Liver disease Neg Hx     Social History   Socioeconomic History  . Marital status: Divorced    Spouse name: Not on file  . Number of children: 2  . Years of education: 79  . Highest education level: Not on file  Occupational History  . Occupation: Estate manager/land agent     Comment: Retired  Tobacco Use  . Smoking status: Former Smoker    Types: Cigarettes  . Smokeless tobacco: Never Used  . Tobacco comment: quit 1987  Vaping Use  . Vaping Use: Never used  Substance and Sexual Activity  . Alcohol use: No  . Drug use: No  . Sexual activity: Not Currently    Partners: Female  Other Topics Concern  . Not on file  Social History Narrative   Fun: Paint, cook, travel   Denies religious beliefs effecting health care.    Social Determinants of Health   Financial Resource Strain:   . Difficulty of Paying Living Expenses: Not on file  Food Insecurity:   . Worried About Charity fundraiser in the Last Year: Not on file  . Ran Out of Food in the Last Year: Not on file  Transportation Needs:   . Lack of Transportation (Medical): Not on file  . Lack of Transportation (Non-Medical): Not on file  Physical Activity:   . Days of Exercise per Week: Not on file  . Minutes of Exercise per Session: Not on file  Stress:   . Feeling of Stress : Not on file  Social Connections:   . Frequency of Communication with Friends and Family: Not on file  . Frequency of Social Gatherings with Friends and Family: Not on file  . Attends Religious Services: Not on file  . Active Member of Clubs or Organizations: Not on file  . Attends Archivist Meetings: Not on file  . Marital Status: Not on file  Intimate Partner Violence:   . Fear of Current or Ex-Partner: Not on file  . Emotionally Abused: Not on file  . Physically Abused: Not on file  . Sexually Abused: Not on file    Past Medical History, Surgical history, Social history, and Family history were reviewed and updated as appropriate.   Please see review of systems for further details on the patient's review from today.   Objective:   Physical Exam:  There were no  vitals taken for this visit.  Physical Exam Neurological:     Mental Status: He is alert and oriented to person, place, and time.      Cranial Nerves: No dysarthria.  Psychiatric:        Attention and Perception: Attention and perception normal.        Speech: Speech normal.        Behavior: Behavior is cooperative.        Thought Content: Thought content normal. Thought content is not paranoid or delusional. Thought content does not include homicidal or suicidal ideation. Thought content does not include homicidal or suicidal plan.        Cognition and Memory: Cognition and memory normal.        Judgment: Judgment normal.     Comments: Insight intact Dysthymic mood     Lab Review:     Component Value Date/Time   NA 136 10/20/2019 1419   K 4.7 10/20/2019 1419   CL 102 10/20/2019 1419   CO2 26 10/20/2019 1419   GLUCOSE 149 (H) 10/20/2019 1419   BUN 36 (H) 10/20/2019 1419   CREATININE 2.36 (H) 10/20/2019 1419   CALCIUM 9.0 10/20/2019 1419   PROT 6.9 10/07/2019 1055   ALBUMIN 4.1 10/07/2019 1055   AST 17 10/07/2019 1055   ALT 24 10/07/2019 1055   ALKPHOS 47 10/07/2019 1055   BILITOT 0.5 10/07/2019 1055   GFRNONAA 27 (L) 10/20/2019 1419   GFRAA 31 (L) 10/20/2019 1419       Component Value Date/Time   WBC 11.8 (H) 10/20/2019 1419   RBC 5.02 10/20/2019 1419   HGB 14.6 10/20/2019 1419   HCT 46.9 10/20/2019 1419   PLT 284 10/20/2019 1419   MCV 93.4 10/20/2019 1419   MCH 29.1 10/20/2019 1419   MCHC 31.1 10/20/2019 1419   RDW 13.5 10/20/2019 1419   LYMPHSABS 2.7 10/07/2019 1055   MONOABS 1.0 10/07/2019 1055   EOSABS 0.3 10/07/2019 1055   BASOSABS 0.2 (H) 10/07/2019 1055    No results found for: POCLITH, LITHIUM   No results found for: PHENYTOIN, PHENOBARB, VALPROATE, CBMZ   .res Assessment: Plan:   Discussed decreasing Buspar to 2/3 tab po q am and continuing 1 tab po QHS since patient reports that BuSpar may be causing drowsiness and fatigue.  Discussed that lowering the morning dose may help reduce daytime somnolence.  Discussed that he could resume 1 tab twice daily if he experiences increased  anxiety with lower dose. Continue Wellbutrin XL 300 mg daily for depression. Continue Klonopin 0.5 mg twice daily as needed. Recommend continuing psychotherapy with Rinaldo Cloud, LCSW. Patient to follow-up with this provider in 3 months or sooner if clinically indicated. Patient advised to contact office with any questions, adverse effects, or acute worsening in signs and symptoms.  Ko[place] was seen today for follow-up.  Diagnoses and all orders for this visit:  Moderate episode of recurrent major depressive disorder (HCC) -     buPROPion (WELLBUTRIN XL) 300 MG 24 hr tablet; Take 1 tablet (300 mg total) by mouth daily.  Panic disorder -     busPIRone (BUSPAR) 15 MG tablet; Take 2/3-1 tab po q am and 1 tab po QHS  Generalized anxiety disorder -     busPIRone (BUSPAR) 15 MG tablet; Take 2/3-1 tab po q am and 1 tab po QHS     Please see After Visit Summary for patient specific instructions.  Future Appointments  Date Time Provider [place]  08/30/2020  11:00 AM Shanon Ace, LCSW [facility] None  09/27/2020 11:00 AM Shanon Ace, LCSW [facility] None  10/19/2020  8:30 AM Mauri Pole, MD [facility]  11/01/2020 11:00 AM Shanon Ace, LCSW [facility] None    No orders of the defined types were placed in this encounter.     -------------------------------

## 2020-08-30 ENCOUNTER — Ambulatory Visit (INDEPENDENT_AMBULATORY_CARE_PROVIDER_SITE_OTHER): Payer: Medicare Other | Admitting: Psychiatry

## 2020-08-30 DIAGNOSIS — F331 Major depressive disorder, recurrent, moderate: Secondary | ICD-10-CM | POA: Diagnosis not present

## 2020-08-30 NOTE — Progress Notes (Signed)
[facility] Counselor/Therapist Progress Note  Patient ID: THADDIUS Griffin, MRN: [id number],    Date: 08/30/2020  Time Spent: 60 minutes 11:00am to 12:00noon  Virtual Visit Note via Programmer, systems with patient by a video enabled telemedicine/telehealth application or telephone, with their informed consent, and verified patient privacy and that I am speaking with the correct person using two identifiers. I discussed the limitations, risks, security and privacy concerns of performing psychotherapy and management service by telephone and the availability of in person appointments. I also discussed with the patient that there may be a patient responsible charge related to this service. The patient expressed understanding and agreed to proceed. I discussed the treatment planning with the patient. The patient was provided an opportunity to ask questions and all were answered. The patient agreed with the plan and demonstrated an understanding of the instructions. The patient was advised to call  our office if  symptoms worsen or feel they are in a crisis state and need immediate contact.   Therapist Location: [facility] Patient Location: home   Treatment Type: Individual Therapy  Reported Symptoms: depression, anxiety, not been sleeping as well past 7-8 day "due to new neighbor with young child who is very noisy". Some fear about his housing situation.  Mental Status Exam:  Appearance:   Casual     Behavior:  Appropriate, Sharing and Motivated  Motor:  Normal  Speech/Language:   Clear and Coherent  Affect:  anxious, some fear, depression  Mood:  anxious and depressed  Thought process:  goal directed  Thought content:    WNL  Sensory/Perceptual disturbances:    WNL  Orientation:  oriented to person, place, time/date, situation, day of week, month of year and year  Attention:  Good  Concentration:  Good and Fair  Memory:  ocassional forgetfulness usually when I'm  more tired  Fund of knowledge:   Good  Insight:    Good  Judgment:   Good  Impulse Control:  Good   Risk Assessment: Danger to Self:  No Self-injurious Behavior: No Danger to Others: No Duty to Warn:no Physical Aggression / Violence:No  Access to Firearms a concern: No  Gang Involvement:No   Subjective: Patient states he's been anxious and depressed.  Not slept well past 7-8 nights" due to new neighbor with young child who is very noisy." "I think I've been managing things some better but maybe avoiding some of the issues with my mother and brother as they've been more antagonistic Recently.  Interventions: Solution-Oriented/Positive Psychology and Ego-Supportive  Diagnosis:   ICD-10-CM   1. Moderate episode of recurrent major depressive disorder ([place])  F33.1     Plan: Patient not signing tx goal updates on computer screen due to [place].  Treatment Goals: Goals remain on tx plan as patient works on strategies to [employer].Progress noted each session and documented in "Progress" section of tx plan.  Long term goal:(measurable) Develop healthy cognitive patterns and beliefs about himself and the world that lead to alleviation of depression and anxiety.Include physical movement daily as possible including walking. Overall goal is for patient to reach a point where he usesacquired anxiety/stress/depression management skills, and reports his depression and anxiety remain below a "3 on a 10 pt scale" for at least 2 months. (Currently reporting a "7" for anxiety and depression.)  Short term goal: Learn and implement personal skills for managing stress, solving daily problems, and resolving conflicts effectively.  Strategy: Teach patient calming skills, problem solving  skills, conflict resolution skills, to manage daily stressors.  PROGRESS: Patient today reports anxiety, depression, and issues with mother and brother have been more aggravated recently. States  he's "not as depressed today as he had been." Brother not working now. Reports "brother and mother are antagonistic towards patient." Concerned for his adult son after incident mother had with patient's son. Discussed this in more detail til patient felt more resolved with it, "doesn't like that it happened but trying to let go of it, knowing that his mom is developing some cognitive issues."  Noted with patient that he is making some progress in managing some conflicts/hurt feelings within family. Shares feelings about aging and health issues including "what can happen" as he projects into the future. States current health concerns for him include blood pressure, kidney problems "early" stage, and cardiac, and is following up with his doctors on each of these. Remaining on low carb dies to help limit his sugar intake, "most days I do good in sticking with it.". Encouraged to continue goal-directed strategies (long and short term goal) discussed in previous sessions, involving identifying the anxious thoughts/challenging them/and replacing them with more realistic and positive thoughts that do not support anxiety.  Also encouraged patient to practice positive self-care including staying in touch with other supportive people especially his second cousin, setting appropriate boundaries and limits with people including family, having more positive self talk, working puzzles that he enjoys and also provides mental stimulation, staying on his medications as prescribed, looking for more positives and negatives, getting outside some each day, healthy nutrition, physical movement as he is able, trying to stay in the present versus the past or future, and focusing on what he can control versus cannot control.  Goal review and progress/challenges noted with patient.  Next appointment within 3 to 4 weeks.    Shanon Ace, LCSW

## 2020-09-09 ENCOUNTER — Other Ambulatory Visit: Payer: Self-pay | Admitting: Gastroenterology

## 2020-09-23 DIAGNOSIS — H401133 Primary open-angle glaucoma, bilateral, severe stage: Secondary | ICD-10-CM | POA: Diagnosis not present

## 2020-09-24 ENCOUNTER — Encounter (HOSPITAL_COMMUNITY): Payer: Self-pay | Admitting: Emergency Medicine

## 2020-09-24 ENCOUNTER — Other Ambulatory Visit: Payer: Self-pay

## 2020-09-24 ENCOUNTER — Emergency Department (HOSPITAL_COMMUNITY): Payer: Medicare Other

## 2020-09-24 ENCOUNTER — Inpatient Hospital Stay (HOSPITAL_COMMUNITY)
Admission: EM | Admit: 2020-09-24 | Discharge: 2020-09-28 | DRG: 392 | Disposition: A | Discharge status: Home / Self Care | Payer: Medicare Other | Attending: Family Medicine | Admitting: Family Medicine

## 2020-09-24 DIAGNOSIS — Y92239 Unspecified place in hospital as the place of occurrence of the external cause: Secondary | ICD-10-CM | POA: Diagnosis present

## 2020-09-24 DIAGNOSIS — R739 Hyperglycemia, unspecified: Secondary | ICD-10-CM | POA: Diagnosis present

## 2020-09-24 DIAGNOSIS — Z6841 Body Mass Index (BMI) 40.0 and over, adult: Secondary | ICD-10-CM

## 2020-09-24 DIAGNOSIS — F418 Other specified anxiety disorders: Secondary | ICD-10-CM | POA: Diagnosis not present

## 2020-09-24 DIAGNOSIS — Z79899 Other long term (current) drug therapy: Secondary | ICD-10-CM

## 2020-09-24 DIAGNOSIS — E785 Hyperlipidemia, unspecified: Secondary | ICD-10-CM | POA: Diagnosis present

## 2020-09-24 DIAGNOSIS — R1084 Generalized abdominal pain: Secondary | ICD-10-CM | POA: Diagnosis not present

## 2020-09-24 DIAGNOSIS — E875 Hyperkalemia: Secondary | ICD-10-CM | POA: Diagnosis present

## 2020-09-24 DIAGNOSIS — Z83438 Family history of other disorder of lipoprotein metabolism and other lipidemia: Secondary | ICD-10-CM

## 2020-09-24 DIAGNOSIS — K5792 Diverticulitis of intestine, part unspecified, without perforation or abscess without bleeding: Secondary | ICD-10-CM | POA: Diagnosis not present

## 2020-09-24 DIAGNOSIS — K5732 Diverticulitis of large intestine without perforation or abscess without bleeding: Secondary | ICD-10-CM | POA: Diagnosis present

## 2020-09-24 DIAGNOSIS — G4733 Obstructive sleep apnea (adult) (pediatric): Secondary | ICD-10-CM | POA: Diagnosis present

## 2020-09-24 DIAGNOSIS — Z888 Allergy status to other drugs, medicaments and biological substances status: Secondary | ICD-10-CM | POA: Diagnosis not present

## 2020-09-24 DIAGNOSIS — R0600 Dyspnea, unspecified: Secondary | ICD-10-CM | POA: Diagnosis not present

## 2020-09-24 DIAGNOSIS — Z87891 Personal history of nicotine dependence: Secondary | ICD-10-CM | POA: Diagnosis not present

## 2020-09-24 DIAGNOSIS — R109 Unspecified abdominal pain: Secondary | ICD-10-CM | POA: Diagnosis not present

## 2020-09-24 DIAGNOSIS — Z8261 Family history of arthritis: Secondary | ICD-10-CM

## 2020-09-24 DIAGNOSIS — E860 Dehydration: Secondary | ICD-10-CM | POA: Diagnosis present

## 2020-09-24 DIAGNOSIS — I129 Hypertensive chronic kidney disease with stage 1 through stage 4 chronic kidney disease, or unspecified chronic kidney disease: Secondary | ICD-10-CM | POA: Diagnosis present

## 2020-09-24 DIAGNOSIS — N183 Chronic kidney disease, stage 3 unspecified: Secondary | ICD-10-CM

## 2020-09-24 DIAGNOSIS — Z823 Family history of stroke: Secondary | ICD-10-CM

## 2020-09-24 DIAGNOSIS — Z8249 Family history of ischemic heart disease and other diseases of the circulatory system: Secondary | ICD-10-CM

## 2020-09-24 DIAGNOSIS — Z955 Presence of coronary angioplasty implant and graft: Secondary | ICD-10-CM | POA: Diagnosis not present

## 2020-09-24 DIAGNOSIS — F419 Anxiety disorder, unspecified: Secondary | ICD-10-CM | POA: Diagnosis present

## 2020-09-24 DIAGNOSIS — Z7982 Long term (current) use of aspirin: Secondary | ICD-10-CM

## 2020-09-24 DIAGNOSIS — K219 Gastro-esophageal reflux disease without esophagitis: Secondary | ICD-10-CM | POA: Diagnosis present

## 2020-09-24 DIAGNOSIS — E876 Hypokalemia: Secondary | ICD-10-CM | POA: Diagnosis present

## 2020-09-24 DIAGNOSIS — Z20822 Contact with and (suspected) exposure to covid-19: Secondary | ICD-10-CM | POA: Diagnosis present

## 2020-09-24 DIAGNOSIS — R000 Tachycardia, unspecified: Secondary | ICD-10-CM | POA: Diagnosis not present

## 2020-09-24 DIAGNOSIS — F32A Depression, unspecified: Secondary | ICD-10-CM | POA: Diagnosis present

## 2020-09-24 DIAGNOSIS — R197 Diarrhea, unspecified: Secondary | ICD-10-CM

## 2020-09-24 DIAGNOSIS — Z833 Family history of diabetes mellitus: Secondary | ICD-10-CM

## 2020-09-24 DIAGNOSIS — T368X5A Adverse effect of other systemic antibiotics, initial encounter: Secondary | ICD-10-CM | POA: Diagnosis not present

## 2020-09-24 DIAGNOSIS — Z882 Allergy status to sulfonamides status: Secondary | ICD-10-CM

## 2020-09-24 DIAGNOSIS — Z23 Encounter for immunization: Secondary | ICD-10-CM | POA: Diagnosis not present

## 2020-09-24 DIAGNOSIS — R110 Nausea: Secondary | ICD-10-CM | POA: Diagnosis not present

## 2020-09-24 DIAGNOSIS — I10 Essential (primary) hypertension: Secondary | ICD-10-CM | POA: Diagnosis not present

## 2020-09-24 DIAGNOSIS — K760 Fatty (change of) liver, not elsewhere classified: Secondary | ICD-10-CM | POA: Diagnosis not present

## 2020-09-24 DIAGNOSIS — N1831 Chronic kidney disease, stage 3a: Secondary | ICD-10-CM | POA: Diagnosis present

## 2020-09-24 DIAGNOSIS — I2510 Atherosclerotic heart disease of native coronary artery without angina pectoris: Secondary | ICD-10-CM | POA: Diagnosis present

## 2020-09-24 DIAGNOSIS — I252 Old myocardial infarction: Secondary | ICD-10-CM | POA: Diagnosis not present

## 2020-09-24 DIAGNOSIS — D72829 Elevated white blood cell count, unspecified: Secondary | ICD-10-CM

## 2020-09-24 LAB — COMPREHENSIVE METABOLIC PANEL
ALT: 29 U/L (ref 0–44)
AST: 20 U/L (ref 15–41)
Albumin: 4.2 g/dL (ref 3.5–5.0)
Alkaline Phosphatase: 45 U/L (ref 38–126)
Anion gap: 9 (ref 5–15)
BUN: 37 mg/dL — ABNORMAL HIGH (ref 8–23)
CO2: 21 mmol/L — ABNORMAL LOW (ref 22–32)
Calcium: 9.4 mg/dL (ref 8.9–10.3)
Chloride: 104 mmol/L (ref 98–111)
Creatinine, Ser: 1.6 mg/dL — ABNORMAL HIGH (ref 0.61–1.24)
GFR, Estimated: 46 mL/min — ABNORMAL LOW (ref >60)
Glucose, Bld: 173 mg/dL — ABNORMAL HIGH (ref 70–99)
Potassium: 5.5 mmol/L — ABNORMAL HIGH (ref 3.5–5.1)
Sodium: 134 mmol/L — ABNORMAL LOW (ref 135–145)
Total Bilirubin: 0.7 mg/dL (ref 0.3–1.2)
Total Protein: 7.8 g/dL (ref 6.5–8.1)

## 2020-09-24 LAB — CBC
HCT: 54.4 % — ABNORMAL HIGH (ref 39.0–52.0)
Hemoglobin: 16.7 g/dL (ref 13.0–17.0)
MCH: 29.7 pg (ref 26.0–34.0)
MCHC: 30.7 g/dL (ref 30.0–36.0)
MCV: 96.8 fL (ref 80.0–100.0)
Platelets: 264 10*3/uL (ref 150–400)
RBC: 5.62 MIL/uL (ref 4.22–5.81)
RDW: 13.5 % (ref 11.5–15.5)
WBC: 11.6 10*3/uL — ABNORMAL HIGH (ref 4.0–10.5)
nRBC: 0 % (ref 0.0–0.2)

## 2020-09-24 LAB — LIPASE, BLOOD: Lipase: 27 U/L (ref 11–51)

## 2020-09-24 MED ORDER — SODIUM CHLORIDE 0.9 % IV BOLUS
1000.0000 mL | Freq: Once | INTRAVENOUS | Status: AC
  Administered 2020-09-24: 1000 mL via INTRAVENOUS

## 2020-09-24 MED ORDER — CIPROFLOXACIN IN D5W 400 MG/200ML IV SOLN
400.0000 mg | Freq: Once | INTRAVENOUS | Status: AC
  Administered 2020-09-24: 400 mg via INTRAVENOUS
  Filled 2020-09-24: qty 200

## 2020-09-24 MED ORDER — DIPHENHYDRAMINE HCL 50 MG/ML IJ SOLN
25.0000 mg | Freq: Once | INTRAMUSCULAR | Status: AC
  Administered 2020-09-24: 25 mg via INTRAVENOUS
  Filled 2020-09-24: qty 1

## 2020-09-24 MED ORDER — PIPERACILLIN-TAZOBACTAM 3.375 G IVPB 30 MIN
3.3750 g | Freq: Once | INTRAVENOUS | Status: AC
  Administered 2020-09-25: 3.375 g via INTRAVENOUS
  Filled 2020-09-24: qty 50

## 2020-09-24 MED ORDER — IOHEXOL 300 MG/ML  SOLN
100.0000 mL | Freq: Once | INTRAMUSCULAR | Status: AC | PRN
  Administered 2020-09-24: 100 mL via INTRAVENOUS

## 2020-09-24 MED ORDER — METRONIDAZOLE IN NACL 5-0.79 MG/ML-% IV SOLN
500.0000 mg | Freq: Once | INTRAVENOUS | Status: AC
  Administered 2020-09-24: 500 mg via INTRAVENOUS
  Filled 2020-09-24: qty 100

## 2020-09-24 NOTE — ED Triage Notes (Signed)
Pt c/o abdominal pain and N/D. Pt was sent here from [facility].

## 2020-09-24 NOTE — H&P (Signed)
History and Physical  Sergio Griffin WUJ:[id number] DOB: [birth year] DOA: 09/24/2020  Referring physician: Milton Ferguson, MD PCP: Moshe Cipro, MD  Patient coming from: Home  Chief Complaint: Abdominal pain, nausea and diarrhea  HPI: Sergio Griffin is a 69 y.o. male with medical history significant for morbid obesity, hypertension, CAD status post stent placement, glaucoma, OSA (not on CPAP at this time) and history of nephrolithiasis who presents to the emergency department due to diarrhea, abdominal pain and nausea.  Patient complained of several weeks of left lower quadrant abdominal pain which was tolerable, this morning around 9 AM, the pain has progressed to the left upper quadrant and was associated with diarrhea.  Patient states that he has had more than 20 episodes of loose bowel movement since onset and he has had 4 loose bowel movement since arrival at the ED, pain was continuous, dull undulating in intensity with maximal being 8/10 on pain scale and minimal 5/8 on pain scale.  He also endorsed nausea with decreased appetite, patient denies eating out, having any sick contacts, any new medication, fever, chest pain or shortness of breath.  ED Course:  In the emergency department, HR ranged within 50s to low 60s, otherwise, he was hemodynamically stable.  Work-up in the ED showed leukocytosis, hypokalemia, BUN to creatinine 37/1.60 (baseline creatinine 1.50), hyperglycemia.  Respiratory panel for influenza A, B and SARS coronavirus 2 was negative.  CT abdomen and pelvis with contrast showed findings suggestive of mild sigmoid colonic diverticulitis.  Patient was treated with IV hydration, was initially started on IV ceftriaxone and metronidazole, but patient had a reaction to IV Cipro, this was stopped, Benadryl was given, and patient was started on IV Zosyn.  Hospitalist was asked to admit patient for further evaluation and management.  Review of Systems: Constitutional: Negative for  chills and fever.  HENT: Negative for ear pain and sore throat.   Eyes: Negative for pain and visual disturbance.  Respiratory: Negative for cough, chest tightness and shortness of breath.   Cardiovascular: Negative for chest pain and palpitations.  Gastrointestinal: Positive for abdominal pain, nausea and diarrhea. Endocrine: Negative for polyphagia and polyuria.  Genitourinary: Negative for decreased urine volume, dysuria, enuresis Musculoskeletal: Negative for arthralgias and back pain.  Skin: Negative for color change and rash.  Allergic/Immunologic: Negative for immunocompromised state.  Neurological: Negative for tremors, syncope, speech difficulty, weakness, light-headedness and headaches.  Hematological: Does not bruise/bleed easily.  All other systems reviewed and are negative   Past Medical History:  Diagnosis Date  . Anxiety   . Barrett's esophagus   . CAD (coronary artery disease)    PCI, stent 2011  . Cardiac arrhythmia   . Chicken pox   . Depression   . Diverticulosis   . Gastric ulcer   . GERD (gastroesophageal reflux disease)   . Glaucoma   . Heart attack ([place]) 01/29/2019  . History of myocardial infarction   . Hyperlipidemia   . Hypertension   . Kidney stones   . Kidney stones   . Pneumonia 11/2014  . Prostatitis   . Reflux   . Sleep apnea   . UTI (lower urinary tract infection)    Past Surgical History:  Procedure Laterality Date  . cardiac stents     last one 01/29/2019  . CYSTOSCOPY/URETEROSCOPY/HOLMIUM LASER/STENT PLACEMENT Left 10/22/2019   Procedure: CYSTOSCOPY/URETEROSCOPY/HOLMIUM LASER/STENT PLACEMENT;  Surgeon: Ardis Hughs, MD;  Location: [facility];  Service: Urology;  Laterality: Left;  . ESOPHAGEAL MANOMETRY N/A 09/11/2017  Procedure: ESOPHAGEAL MANOMETRY (EM);  Surgeon: Mauri Pole, MD;  Location: [facility];  Service: Endoscopy;  Laterality: N/A;  . EYE SURGERY Left    x 4  . EYE SURGERY Right    x 3    Social History:   reports that he has quit smoking. His smoking use included cigarettes. He has never used smokeless tobacco. He reports that he does not drink alcohol and does not use drugs.   Allergies  Allergen Reactions  . Tamsulosin Other (See Comments)    Light headed and pass out  . Ciprofloxacin Itching  . Ibuprofen Diarrhea and Nausea Only  . Propoxyphene Itching    Darvocet  . Sulfa Antibiotics Itching    Family History  Problem Relation Age of Onset  . Arthritis Mother   . Hyperlipidemia Mother   . Stroke Mother   . Hypertension Mother   . Ovarian cancer Mother   . Hyperlipidemia Father   . Heart disease Father   . Hypertension Father   . Non-Hodgkin's lymphoma Father   . Hyperlipidemia Maternal Grandmother   . Heart disease Maternal Grandmother   . Stroke Maternal Grandmother   . Hypertension Maternal Grandmother   . Diabetes Maternal Grandmother   . Hyperlipidemia Maternal Grandfather   . Heart disease Maternal Grandfather   . Hypertension Maternal Grandfather   . Heart disease Paternal Grandmother   . Heart disease Paternal Grandfather   . Stroke Paternal Grandfather   . Hypertension Paternal Grandfather   . Congestive Heart Failure Brother   . Colon cancer Neg Hx   . Esophageal cancer Neg Hx   . Pancreatic cancer Neg Hx   . Stomach cancer Neg Hx   . Liver disease Neg Hx     Prior to Admission medications   Medication Sig Start Date End Date Taking? Authorizing Provider  acetaminophen (TYLENOL) 500 MG tablet Take 500-1,000 mg by mouth every 6 (six) hours as needed for moderate pain or headache.   Yes [provider]  amLODipine (NORVASC) 5 MG tablet Take 5 mg by mouth daily.   Yes [provider]  aspirin EC 81 MG tablet Take 81 mg by mouth at bedtime.    Yes [provider]  buPROPion (WELLBUTRIN XL) 300 MG 24 hr tablet Take 1 tablet (300 mg total) by mouth daily. 08/24/20 11/22/20 Yes Thayer Headings, PMHNP  busPIRone (BUSPAR) 15 MG tablet Take  2/3-1 tab po q am and 1 tab po QHS 08/24/20  Yes Thayer Headings, PMHNP  Carboxymethylcellul-Glycerin (LUBRICATING EYE DROPS OP) Place 1 drop into both eyes daily as needed (dry eyes).   Yes [provider]  clonazePAM (KLONOPIN) 0.5 MG tablet Take 1 tablet (0.5 mg total) by mouth 2 (two) times daily as needed for anxiety. 04/04/20  Yes Thayer Headings, PMHNP  DM-APAP-CPM (CORICIDIN HBP PO) Take 1 tablet by mouth daily as needed (severe allergies).   Yes [provider]  furosemide (LASIX) 20 MG tablet Take 20 mg by mouth daily.    Yes [provider]  latanoprost (XALATAN) 0.005 % ophthalmic solution Place 1 drop into both eyes at bedtime. 05/03/15  Yes [provider]  losartan (COZAAR) 50 MG tablet Take 100 mg by mouth daily.    Yes [provider]  metoprolol succinate (TOPROL-XL) 25 MG 24 hr tablet Take 12.5-25 mg by mouth See admin instructions. Take 25 mg in the morning and 12.5 mg in the evening   Yes [provider]  nitroGLYCERIN (NITROSTAT) 0.4  MG SL tablet Place 1 tablet (0.4 mg total) under the tongue every 5 (five) minutes x 3 doses as needed for chest pain. 06/30/15  Yes Love, Ivan Anchors, PA-C  Omega-3 Fatty Acids (FISH OIL) 1000 MG CAPS Take 1,000 mg by mouth daily.   Yes [provider]  ondansetron (ZOFRAN) 4 MG tablet Take 4 mg by mouth every 8 (eight) hours as needed for nausea or vomiting.   Yes [provider]  ondansetron (ZOFRAN-ODT) 4 MG disintegrating tablet DISSOLVE 1 TABLET IN MOUTH EVERY 8 HOURS AS NEEDED FOR NAUSEA FOR VOMITING Patient taking differently: Take 4 mg by mouth every 8 (eight) hours as needed.  09/09/20  Yes Nandigam, Venia Minks, MD  prasugrel (EFFIENT) 10 MG TABS tablet Take 1 tablet (10 mg total) by mouth at bedtime. Resume 48 hours after surgery 10/22/19  Yes Ardis Hughs, MD  sucralfate (CARAFATE) 1 g tablet Take 1 tablet (1 g total) by mouth 2 (two) times daily. As needed Patient  taking differently: Take 1 g by mouth at bedtime.  08/18/19  Yes Nandigam, Venia Minks, MD  TRULANCE 3 MG TABS Take 1 tablet by mouth once daily 09/09/20  Yes Nandigam, Venia Minks, MD  metroNIDAZOLE (FLAGYL) 500 MG tablet Take 1 tablet (500 mg total) by mouth 2 (two) times daily. Patient not taking: Reported on 09/24/2020 10/08/19   Levin Erp, PA  phenazopyridine (PYRIDIUM) 200 MG tablet Take 1 tablet (200 mg total) by mouth 3 (three) times daily as needed for pain. Patient not taking: Reported on 09/24/2020 10/22/19   Ardis Hughs, MD  traMADol (ULTRAM) 50 MG tablet Take 1-2 tablets (50-100 mg total) by mouth every 6 (six) hours as needed for moderate pain. Patient not taking: Reported on 09/24/2020 10/22/19   Ardis Hughs, MD    Physical Exam: BP 124/62   Pulse (!) 53   Temp 98.1 F (36.7 C) (Oral)   Resp 19   Ht 5\' 11"  (1.803 m)   Wt (!) 150.6 kg   SpO2 93%   BMI 46.30 kg/m   . General: 69 y.o. year-old male well developed well nourished in no acute distress.  Alert and oriented x3. Marland Kitchen HEENT: Dry mucous membrane, NCAT, EOMI . Neck: Supple, trachea medial . Cardiovascular: Regular rate and rhythm with no rubs or gallops.  No thyromegaly or JVD noted.  No lower extremity edema. 2/4 pulses in all 4 extremities. Marland Kitchen Respiratory: Clear to auscultation with no wheezes or rales. Good inspiratory effort. . Abdomen: Soft, tender to palpation in left quadrants, normal bowel sounds x4 quadrants. . Muskuloskeletal: No cyanosis, clubbing or edema noted bilaterally . Neuro: CN II-XII intact, strength, sensation, reflexes . Skin: No ulcerative lesions noted or rashes . Psychiatry: Judgement and insight appear normal. Mood is appropriate for condition and setting          Labs on Admission:  Basic Metabolic Panel: Recent Labs  Lab 09/24/20 1953  NA 134*  K 5.5*  CL 104  CO2 21*  GLUCOSE 173*  BUN 37*  CREATININE 1.60*  CALCIUM 9.4   Liver Function Tests: Recent  Labs  Lab 09/24/20 1953  AST 20  ALT 29  ALKPHOS 45  BILITOT 0.7  PROT 7.8  ALBUMIN 4.2   Recent Labs  Lab 09/24/20 1953  LIPASE 27   No results for input(s): AMMONIA in the last 168 hours. CBC: Recent Labs  Lab 09/24/20 1953  WBC 11.6*  HGB 16.7  HCT 54.4*  MCV 96.8  PLT 264   Cardiac Enzymes: No results for input(s): CKTOTAL, CKMB, CKMBINDEX, TROPONINI in the last 168 hours.  BNP (last 3 results) No results for input(s): BNP in the last 8760 hours.  ProBNP (last 3 results) No results for input(s): PROBNP in the last 8760 hours.  CBG: No results for input(s): GLUCAP in the last 168 hours.  Radiological Exams on Admission: CT ABDOMEN PELVIS W CONTRAST  Result Date: 09/24/2020 CLINICAL DATA:  Lower abdominal pain EXAM: CT ABDOMEN AND PELVIS WITH CONTRAST TECHNIQUE: Multidetector CT imaging of the abdomen and pelvis was performed using the standard protocol following bolus administration of intravenous contrast. CONTRAST:  132mL OMNIPAQUE IOHEXOL 300 MG/ML  SOLN COMPARISON:  October 07, 2019 FINDINGS: Lower chest: The visualized heart size within normal limits. No pericardial fluid/thickening. No hiatal hernia. The visualized portions of the lungs are clear. Hepatobiliary: There is diffuse low density seen throughout the liver parenchyma.The main portal vein is patent. No evidence of calcified gallstones, gallbladder wall thickening or biliary dilatation. Pancreas: Unremarkable. No pancreatic ductal dilatation or surrounding inflammatory changes. Spleen: Normal in size without focal abnormality. Adrenals/Urinary Tract: Both adrenal glands appear normal. Calcifications are seen throughout the left kidney the largest clustered within the lower pole measuring 8 mm. No hydronephrosis. Bladder is unremarkable. Stomach/Bowel: The stomach, small bowel are normal in appearance. There is question of mild wall thickening seen within the sigmoid colon with minimal surrounding fat  stranding changes. Significant scattered colonic diverticula are noted. No pericolonic free fluid or loculated fluid collections. No inflammatory changes, wall thickening, or obstructive findings.The appendix is normal. Vascular/Lymphatic: There are no enlarged mesenteric, retroperitoneal, or pelvic lymph nodes. Scattered aortic atherosclerotic calcifications are seen without aneurysmal dilatation. Reproductive: The prostate is unremarkable. Other: No evidence of abdominal wall mass or hernia. Musculoskeletal: No acute or significant osseous findings. IMPRESSION: Findings which could be suggestive of mild sigmoid colonic diverticulitis. No pericolonic loculated fluid collections or free air. Nonobstructing left renal calculi. Hepatic steatosis Aortic Atherosclerosis (ICD10-I70.0). Electronically Signed   By: Prudencio Pair M.D.   On: 09/24/2020 22:01    EKG: I independently viewed the EKG done and my findings are as followed: Sinus pericardial rate of 59 bpm  Assessment/Plan Present on Admission: . Diverticulitis . Hyperkalemia . Essential hypertension  Active Problems:   Hyperkalemia   Abdominal pain   Essential hypertension   Diverticulitis   Nausea   Diarrhea   Leukocytosis   CKD (chronic kidney disease), stage III (HCC)   OSA (obstructive sleep apnea)   Dehydration   Hyperglycemia  Abdominal pain and nausea secondary to acute diverticulitis CT abdomen and pelvis with contrast showed findings suggestive of mild sigmoid colonic diverticulitis Patient will be admitted to MedSurg unit Continue IV NS at 100 mLs/Hr Continue IV Zosyn 3.375 q.8h Continue IV morphine 2 mg q.4h p.r.n. for moderate to severe pain Continue IV Zofran 4 mg every 6 hours p.r.n for nausea Continue clear liquid diet with plan to advanced diet as tolerated Obtain blood culture x2  Acute diarrhea in the setting of above Obtain C. difficile quick screen and GI panel Continue IV hydration as described  above  Leukocytosis possibly reactive/infectious due to acute vasculitis WBC 11.6; continue IV antibiotics as described above Continue to monitor WBC with morning labs  Hyperkalemia K+ 5.5, no EKG changes noted Continue to monitor potassium level with morning labs  Hyperglycemia with no known history of type 2 diabetes mellitus BG = 173; last hemoglobin A1c on record  was in July 2016 -6.3  Hemoglobin A1c will be checked  CKD stage III BUN to creatinine 37/1.60 (baseline creatinine 1.50) Renally adjust medications, avoid nephrotoxic agents/dehydration/hypotension  Essential hypertension Continue amlodipine, losartan and Toprol per home regimen as tolerated by BP  CAD s/p stent placement Continue home prasugrel, Toprol Continue nitroglycerin sublingual per home regimen  OSA not on CPAP Stable  Morbid obesity (BMI 46.3) Patient will be counseled on diet and lifestyle modification when more stable   DVT prophylaxis: Lovenox  Code Status: Full code  Family Communication: Brother at bedside (all questions answered to satisfaction)  Disposition Plan:  Patient is from:                        home Anticipated DC to:                   SNF or family members home Anticipated DC date:               2-3 days Anticipated DC barriers:         Patient is not stable to be discharged at this time due to acute diverticulitis which requires inpatient management  Consults called: None  Admission status: Inpatient    Bernadette Hoit MD [facility] Pager [phone]  If 7PM-7AM, please contact night-coverage [email or web address] Password [email or web address]  09/25/2020, 3:07 AM

## 2020-09-24 NOTE — ED Notes (Signed)
Pt reports diarrhea started this morning around 9 am followed by pain to abdomen located in ULQ and lower abdomen. Pt says he has had several loose stools today and pain in abdomen is aching type pain.

## 2020-09-24 NOTE — ED Notes (Signed)
Pt c/o of itching at IV site after starting cipro- cipro was stopped and DR Roderic Palau notified. Will give benadryl and start different anbx.

## 2020-09-24 NOTE — ED Provider Notes (Signed)
[facility] EMERGENCY DEPARTMENT Provider Note   CSN: [id number] Arrival date & time: 09/24/20  1900     History Chief Complaint  Patient presents with  . Abdominal Pain    Sergio Griffin is a 69 y.o. male.  Patient complains of vomiting and diarrhea abdominal pain.  No blood in his stool  The history is provided by the patient and medical records. No language interpreter was used.  Abdominal Pain Pain location:  Generalized Pain quality: aching   Pain radiates to:  Does not radiate Pain severity:  Moderate Onset quality:  Sudden Timing:  Constant Progression:  Worsening Chronicity:  New Context: not alcohol use   Associated symptoms: diarrhea and vomiting   Associated symptoms: no chest pain, no cough, no fatigue and no hematuria        Past Medical History:  Diagnosis Date  . Anxiety   . Barrett's esophagus   . CAD (coronary artery disease)    PCI, stent 2011  . Cardiac arrhythmia   . Chicken pox   . Depression   . Diverticulosis   . Gastric ulcer   . GERD (gastroesophageal reflux disease)   . Glaucoma   . Heart attack ([place]) 01/29/2019  . History of myocardial infarction   . Hyperlipidemia   . Hypertension   . Kidney stones   . Kidney stones   . Pneumonia 11/2014  . Prostatitis   . Reflux   . Sleep apnea   . UTI (lower urinary tract infection)     Patient Active Problem List   Diagnosis Date Noted  . Diverticulitis 09/24/2020  . Major depressive disorder, recurrent, severe without psychotic features ([place]) 09/08/2018  . Esophageal dysphagia   . Urinary frequency 11/16/2015  . Anxiety and depression 11/16/2015  . Physical debility 06/22/2015  . DOE (dyspnea on exertion)   . Acute on chronic renal failure ([place])   . Hematochezia   . Abnormal CT scan, colon   . Acute pulmonary edema (H[facility])   . Hypokalemia   . Acute blood loss anemia   . Ischemic colitis ([place])   . Essential hypertension   . Infectious colitis   . Acute cystitis without  hematuria   . Pulmonary nodule   . Abdominal pain, acute   . Blood poisoning   . Hyperkalemia 06/11/2015  . CAD (coronary artery disease), native coronary artery 06/11/2015  . Wide-complex tachycardia ([place]) 06/11/2015  . Abdominal pain, generalized   . CAD in native artery   . Elevated troponin   . UTI (lower urinary tract infection)   . Solitary pulmonary nodule   . Colitis 06/10/2015  . NSTEMI (non-ST elevated myocardial infarction) ([place]) 06/10/2015  . Acute on chronic kidney failure ([place]) 06/10/2015  . Lactic acidosis 06/10/2015    Past Surgical History:  Procedure Laterality Date  . cardiac stents     last one 01/29/2019  . CYSTOSCOPY/URETEROSCOPY/HOLMIUM LASER/STENT PLACEMENT Left 10/22/2019   Procedure: CYSTOSCOPY/URETEROSCOPY/HOLMIUM LASER/STENT PLACEMENT;  Surgeon: Ardis Hughs, MD;  Location: [facility] ORS;  Service: Urology;  Laterality: Left;  . ESOPHAGEAL MANOMETRY N/A 09/11/2017   Procedure: ESOPHAGEAL MANOMETRY (EM);  Surgeon: Mauri Pole, MD;  Location: [facility] ENDOSCOPY;  Service: Endoscopy;  Laterality: N/A;  . EYE SURGERY Left    x 4  . EYE SURGERY Right    x 3       Family History  Problem Relation Age of Onset  . Arthritis Mother   . Hyperlipidemia Mother   . Stroke Mother   .  Hypertension Mother   . Ovarian cancer Mother   . Hyperlipidemia Father   . Heart disease Father   . Hypertension Father   . Non-Hodgkin's lymphoma Father   . Hyperlipidemia Maternal Grandmother   . Heart disease Maternal Grandmother   . Stroke Maternal Grandmother   . Hypertension Maternal Grandmother   . Diabetes Maternal Grandmother   . Hyperlipidemia Maternal Grandfather   . Heart disease Maternal Grandfather   . Hypertension Maternal Grandfather   . Heart disease Paternal Grandmother   . Heart disease Paternal Grandfather   . Stroke Paternal Grandfather   . Hypertension Paternal Grandfather   . Congestive Heart Failure Brother   . Colon cancer Neg Hx   .  Esophageal cancer Neg Hx   . Pancreatic cancer Neg Hx   . Stomach cancer Neg Hx   . Liver disease Neg Hx     Social History   Tobacco Use  . Smoking status: Former Smoker    Types: Cigarettes  . Smokeless tobacco: Never Used  . Tobacco comment: quit 1987  Vaping Use  . Vaping Use: Never used  Substance Use Topics  . Alcohol use: No  . Drug use: No    Home Medications Prior to Admission medications   Medication Sig Start Date End Date Taking? Authorizing Provider  acetaminophen (TYLENOL) 500 MG tablet Take 500-1,000 mg by mouth every 6 (six) hours as needed for moderate pain or headache.   Yes [provider]  amLODipine (NORVASC) 5 MG tablet Take 5 mg by mouth daily.   Yes [provider]  aspirin EC 81 MG tablet Take 81 mg by mouth at bedtime.    Yes [provider]  buPROPion (WELLBUTRIN XL) 300 MG 24 hr tablet Take 1 tablet (300 mg total) by mouth daily. 08/24/20 11/22/20 Yes Thayer Headings, PMHNP  busPIRone (BUSPAR) 15 MG tablet Take 2/3-1 tab po q am and 1 tab po QHS 08/24/20  Yes Thayer Headings, PMHNP  Carboxymethylcellul-Glycerin (LUBRICATING EYE DROPS OP) Place 1 drop into both eyes daily as needed (dry eyes).   Yes [provider]  clonazePAM (KLONOPIN) 0.5 MG tablet Take 1 tablet (0.5 mg total) by mouth 2 (two) times daily as needed for anxiety. 04/04/20  Yes Thayer Headings, PMHNP  DM-APAP-CPM (CORICIDIN HBP PO) Take 1 tablet by mouth daily as needed (severe allergies).   Yes [provider]  furosemide (LASIX) 20 MG tablet Take 20 mg by mouth daily.    Yes [provider]  latanoprost (XALATAN) 0.005 % ophthalmic solution Place 1 drop into both eyes at bedtime. 05/03/15  Yes [provider]  losartan (COZAAR) 50 MG tablet Take 100 mg by mouth daily.    Yes [provider]  metoprolol succinate (TOPROL-XL) 25 MG 24 hr tablet Take 12.5-25 mg by mouth See admin instructions. Take 25 mg in the morning and  12.5 mg in the evening   Yes [provider]  nitroGLYCERIN (NITROSTAT) 0.4 MG SL tablet Place 1 tablet (0.4 mg total) under the tongue every 5 (five) minutes x 3 doses as needed for chest pain. 06/30/15  Yes Love, Ivan Anchors, PA-C  Omega-3 Fatty Acids (FISH OIL) 1000 MG CAPS Take 1,000 mg by mouth daily.   Yes [provider]  ondansetron (ZOFRAN) 4 MG tablet Take 4 mg by mouth every 8 (eight) hours as needed for nausea or vomiting.   Yes [provider]  ondansetron (ZOFRAN-ODT) 4 MG disintegrating tablet DISSOLVE 1 TABLET IN MOUTH  EVERY 8 HOURS AS NEEDED FOR NAUSEA FOR VOMITING Patient taking differently: Take 4 mg by mouth every 8 (eight) hours as needed.  09/09/20  Yes Nandigam, Venia Minks, MD  prasugrel (EFFIENT) 10 MG TABS tablet Take 1 tablet (10 mg total) by mouth at bedtime. Resume 48 hours after surgery 10/22/19  Yes Ardis Hughs, MD  sucralfate (CARAFATE) 1 g tablet Take 1 tablet (1 g total) by mouth 2 (two) times daily. As needed Patient taking differently: Take 1 g by mouth at bedtime.  08/18/19  Yes Nandigam, Venia Minks, MD  TRULANCE 3 MG TABS Take 1 tablet by mouth once daily 09/09/20  Yes Nandigam, Venia Minks, MD  metroNIDAZOLE (FLAGYL) 500 MG tablet Take 1 tablet (500 mg total) by mouth 2 (two) times daily. Patient not taking: Reported on 09/24/2020 10/08/19   Levin Erp, PA  phenazopyridine (PYRIDIUM) 200 MG tablet Take 1 tablet (200 mg total) by mouth 3 (three) times daily as needed for pain. Patient not taking: Reported on 09/24/2020 10/22/19   Ardis Hughs, MD  traMADol (ULTRAM) 50 MG tablet Take 1-2 tablets (50-100 mg total) by mouth every 6 (six) hours as needed for moderate pain. Patient not taking: Reported on 09/24/2020 10/22/19   Ardis Hughs, MD    Allergies    Tamsulosin, Ciprofloxacin, Ibuprofen, Propoxyphene, and Sulfa antibiotics  Review of Systems   Review of Systems  Constitutional: Negative for appetite change  and fatigue.  HENT: Negative for congestion, ear discharge and sinus pressure.   Eyes: Negative for discharge.  Respiratory: Negative for cough.   Cardiovascular: Negative for chest pain.  Gastrointestinal: Positive for abdominal pain, diarrhea and vomiting.  Genitourinary: Negative for frequency and hematuria.  Musculoskeletal: Negative for back pain.  Skin: Negative for rash.  Neurological: Negative for seizures and headaches.  Psychiatric/Behavioral: Negative for hallucinations.    Physical Exam Updated Vital Signs BP (!) 146/53   Pulse (!) 52   Temp 98.1 F (36.7 C) (Oral)   Resp 18   Ht 5\' 11"  (1.803 m)   Wt (!) 150.6 kg   SpO2 94%   BMI 46.30 kg/m   Physical Exam Vitals and nursing note reviewed.  Constitutional:      Appearance: He is well-developed.  HENT:     Head: Normocephalic.     Nose: Nose normal.  Eyes:     General: No scleral icterus.    Conjunctiva/sclera: Conjunctivae normal.  Neck:     Thyroid: No thyromegaly.  Cardiovascular:     Rate and Rhythm: Normal rate and regular rhythm.     Heart sounds: No murmur heard.  No friction rub. No gallop.   Pulmonary:     Breath sounds: No stridor. No wheezing or rales.  Chest:     Chest wall: No tenderness.  Abdominal:     General: There is no distension.     Tenderness: There is abdominal tenderness. There is no rebound.  Musculoskeletal:        General: Normal range of motion.     Cervical back: Neck supple.  Lymphadenopathy:     Cervical: No cervical adenopathy.  Skin:    Findings: No erythema or rash.  Neurological:     Mental Status: He is alert and oriented to person, place, and time.     Motor: No abnormal muscle tone.     Coordination: Coordination normal.  Psychiatric:        Behavior: Behavior normal.     ED Results /  Procedures / Treatments   Labs (all labs ordered are listed, but only abnormal results are displayed) Labs Reviewed  COMPREHENSIVE METABOLIC PANEL - Abnormal; Notable  for the following components:      Result Value   Sodium 134 (*)    Potassium 5.5 (*)    CO2 21 (*)    Glucose, Bld 173 (*)    BUN 37 (*)    Creatinine, Ser 1.60 (*)    GFR, Estimated 46 (*)    All other components within normal limits  CBC - Abnormal; Notable for the following components:   WBC 11.6 (*)    HCT 54.4 (*)    All other components within normal limits  LIPASE, BLOOD  URINALYSIS, ROUTINE W REFLEX MICROSCOPIC    EKG None  Radiology CT ABDOMEN PELVIS W CONTRAST  Result Date: 09/24/2020 CLINICAL DATA:  Lower abdominal pain EXAM: CT ABDOMEN AND PELVIS WITH CONTRAST TECHNIQUE: Multidetector CT imaging of the abdomen and pelvis was performed using the standard protocol following bolus administration of intravenous contrast. CONTRAST:  120mL OMNIPAQUE IOHEXOL 300 MG/ML  SOLN COMPARISON:  October 07, 2019 FINDINGS: Lower chest: The visualized heart size within normal limits. No pericardial fluid/thickening. No hiatal hernia. The visualized portions of the lungs are clear. Hepatobiliary: There is diffuse low density seen throughout the liver parenchyma.The main portal vein is patent. No evidence of calcified gallstones, gallbladder wall thickening or biliary dilatation. Pancreas: Unremarkable. No pancreatic ductal dilatation or surrounding inflammatory changes. Spleen: Normal in size without focal abnormality. Adrenals/Urinary Tract: Both adrenal glands appear normal. Calcifications are seen throughout the left kidney the largest clustered within the lower pole measuring 8 mm. No hydronephrosis. Bladder is unremarkable. Stomach/Bowel: The stomach, small bowel are normal in appearance. There is question of mild wall thickening seen within the sigmoid colon with minimal surrounding fat stranding changes. Significant scattered colonic diverticula are noted. No pericolonic free fluid or loculated fluid collections. No inflammatory changes, wall thickening, or obstructive findings.The appendix  is normal. Vascular/Lymphatic: There are no enlarged mesenteric, retroperitoneal, or pelvic lymph nodes. Scattered aortic atherosclerotic calcifications are seen without aneurysmal dilatation. Reproductive: The prostate is unremarkable. Other: No evidence of abdominal wall mass or hernia. Musculoskeletal: No acute or significant osseous findings. IMPRESSION: Findings which could be suggestive of mild sigmoid colonic diverticulitis. No pericolonic loculated fluid collections or free air. Nonobstructing left renal calculi. Hepatic steatosis Aortic Atherosclerosis (ICD10-I70.0). Electronically Signed   By: Prudencio Pair M.D.   On: 09/24/2020 22:01    Procedures Procedures (including critical care time)  Medications Ordered in ED Medications  metroNIDAZOLE (FLAGYL) IVPB 500 mg (500 mg Intravenous New Bag/Given 09/24/20 2312)  piperacillin-tazobactam (ZOSYN) IVPB 3.375 g (has no administration in time range)  sodium chloride 0.9 % bolus 1,000 mL (0 mLs Intravenous Stopped 09/24/20 2235)  iohexol (OMNIPAQUE) 300 MG/ML solution 100 mL (100 mLs Intravenous Contrast Given 09/24/20 2114)  ciprofloxacin (CIPRO) IVPB 400 mg (0 mg Intravenous Stopped 09/24/20 2305)  diphenhydrAMINE (BENADRYL) injection 25 mg (25 mg Intravenous Given 09/24/20 2310)    ED Course  I have reviewed the triage vital signs and the nursing notes.  Pertinent labs & imaging results that were available during my care of the patient were reviewed by me and considered in my medical decision making (see chart for details).    MDM Rules/Calculators/A&P                          Patient with diverticulitis.  He will be admitted to medicine and started on IV antibiotics and fluids and nausea medicine     This patient presents to the ED for concern of abdominal pain vomiting diarrhea this involves an extensive number of treatment options, and is a complaint that carries with it a high risk of complications and morbidity.  The differential  diagnosis includes gastroenteritis   Lab Tests:   I Ordered, reviewed, and interpreted labs, which included CBC chemistries which showed mild elevated white count and mild AKI  Medicines ordered:   I ordered medication antibiotics for diverticulitis  Imaging Studies ordered:   I ordered imaging studies which included CT abdomen  I independently visualized and interpreted imaging which showed diverticulitis  Additional history obtained:   Additional history obtained from record  Previous records obtained and reviewed.  Consultations Obtained:     Reevaluation:  After the interventions stated above, I reevaluated the patient and found no change  Critical Interventions:  .   Final Clinical Impression(s) / ED Diagnoses Final diagnoses:  Diverticulitis    Rx / DC Orders ED Discharge Orders    None       Milton Ferguson, MD 09/25/20 1001

## 2020-09-25 ENCOUNTER — Encounter (HOSPITAL_COMMUNITY): Payer: Self-pay | Admitting: Internal Medicine

## 2020-09-25 ENCOUNTER — Inpatient Hospital Stay (HOSPITAL_COMMUNITY): Payer: Medicare Other

## 2020-09-25 ENCOUNTER — Other Ambulatory Visit: Payer: Self-pay

## 2020-09-25 DIAGNOSIS — D72829 Elevated white blood cell count, unspecified: Secondary | ICD-10-CM

## 2020-09-25 DIAGNOSIS — N183 Chronic kidney disease, stage 3 unspecified: Secondary | ICD-10-CM

## 2020-09-25 DIAGNOSIS — R110 Nausea: Secondary | ICD-10-CM

## 2020-09-25 DIAGNOSIS — E860 Dehydration: Secondary | ICD-10-CM

## 2020-09-25 DIAGNOSIS — R739 Hyperglycemia, unspecified: Secondary | ICD-10-CM

## 2020-09-25 DIAGNOSIS — R197 Diarrhea, unspecified: Secondary | ICD-10-CM

## 2020-09-25 DIAGNOSIS — G4733 Obstructive sleep apnea (adult) (pediatric): Secondary | ICD-10-CM

## 2020-09-25 LAB — CBC
HCT: 50.7 % (ref 39.0–52.0)
Hemoglobin: 15.4 g/dL (ref 13.0–17.0)
MCH: 29.9 pg (ref 26.0–34.0)
MCHC: 30.4 g/dL (ref 30.0–36.0)
MCV: 98.4 fL (ref 80.0–100.0)
Platelets: 272 10*3/uL (ref 150–400)
RBC: 5.15 MIL/uL (ref 4.22–5.81)
RDW: 13.7 % (ref 11.5–15.5)
WBC: 10.7 10*3/uL — ABNORMAL HIGH (ref 4.0–10.5)
nRBC: 0 % (ref 0.0–0.2)

## 2020-09-25 LAB — PROTIME-INR
INR: 1.1 (ref 0.8–1.2)
Prothrombin Time: 13.5 seconds (ref 11.4–15.2)

## 2020-09-25 LAB — COMPREHENSIVE METABOLIC PANEL
ALT: 24 U/L (ref 0–44)
AST: 15 U/L (ref 15–41)
Albumin: 3.9 g/dL (ref 3.5–5.0)
Alkaline Phosphatase: 40 U/L (ref 38–126)
Anion gap: 8 (ref 5–15)
BUN: 32 mg/dL — ABNORMAL HIGH (ref 8–23)
CO2: 23 mmol/L (ref 22–32)
Calcium: 8.8 mg/dL — ABNORMAL LOW (ref 8.9–10.3)
Chloride: 104 mmol/L (ref 98–111)
Creatinine, Ser: 1.66 mg/dL — ABNORMAL HIGH (ref 0.61–1.24)
GFR, Estimated: 44 mL/min — ABNORMAL LOW (ref >60)
Glucose, Bld: 130 mg/dL — ABNORMAL HIGH (ref 70–99)
Potassium: 4.9 mmol/L (ref 3.5–5.1)
Sodium: 135 mmol/L (ref 135–145)
Total Bilirubin: 0.8 mg/dL (ref 0.3–1.2)
Total Protein: 7.3 g/dL (ref 6.5–8.1)

## 2020-09-25 LAB — RESPIRATORY PANEL BY RT PCR (FLU A&B, COVID)
Influenza A by PCR: NEGATIVE
Influenza B by PCR: NEGATIVE
SARS Coronavirus 2 by RT PCR: NEGATIVE

## 2020-09-25 LAB — MAGNESIUM: Magnesium: 2 mg/dL (ref 1.7–2.4)

## 2020-09-25 LAB — PHOSPHORUS: Phosphorus: 2.7 mg/dL (ref 2.5–4.6)

## 2020-09-25 LAB — HEMOGLOBIN A1C
Hgb A1c MFr Bld: 6.6 % — ABNORMAL HIGH (ref 4.8–5.6)
Mean Plasma Glucose: 142.72 mg/dL

## 2020-09-25 LAB — HIV ANTIBODY (ROUTINE TESTING W REFLEX): HIV Screen 4th Generation wRfx: NONREACTIVE

## 2020-09-25 LAB — APTT: aPTT: 30 seconds (ref 24–36)

## 2020-09-25 MED ORDER — ENOXAPARIN SODIUM 80 MG/0.8ML ~~LOC~~ SOLN
75.0000 mg | SUBCUTANEOUS | Status: DC
  Administered 2020-09-26 – 2020-09-28 (×3): 75 mg via SUBCUTANEOUS
  Filled 2020-09-25 (×4): qty 0.8

## 2020-09-25 MED ORDER — NITROGLYCERIN 0.4 MG SL SUBL: 0.4000 mg | SUBLINGUAL_TABLET | SUBLINGUAL | Status: DC | PRN

## 2020-09-25 MED ORDER — ONDANSETRON HCL 4 MG/2ML IJ SOLN
4.0000 mg | Freq: Four times a day (QID) | INTRAMUSCULAR | Status: DC | PRN
  Administered 2020-09-25: 4 mg via INTRAVENOUS
  Filled 2020-09-25: qty 2

## 2020-09-25 MED ORDER — METOPROLOL SUCCINATE ER 25 MG PO TB24: 12.5000 mg | ORAL_TABLET | ORAL | Status: DC

## 2020-09-25 MED ORDER — SODIUM CHLORIDE 0.9 % IV SOLN: Freq: Once | INTRAVENOUS | Status: AC

## 2020-09-25 MED ORDER — AMLODIPINE BESYLATE 5 MG PO TABS
5.0000 mg | ORAL_TABLET | Freq: Every day | ORAL | Status: DC
  Administered 2020-09-25 – 2020-09-28 (×4): 5 mg via ORAL
  Filled 2020-09-25 (×5): qty 1

## 2020-09-25 MED ORDER — POLYVINYL ALCOHOL 1.4 % OP SOLN
1.0000 [drp] | Freq: Every day | OPHTHALMIC | Status: DC | PRN
  Filled 2020-09-25: qty 15

## 2020-09-25 MED ORDER — PRASUGREL HCL 10 MG PO TABS
10.0000 mg | ORAL_TABLET | Freq: Every day | ORAL | Status: DC
  Administered 2020-09-25 – 2020-09-27 (×3): 10 mg via ORAL
  Filled 2020-09-25 (×5): qty 1

## 2020-09-25 MED ORDER — PIPERACILLIN-TAZOBACTAM 3.375 G IVPB
3.3750 g | Freq: Three times a day (TID) | INTRAVENOUS | Status: DC
  Administered 2020-09-25 – 2020-09-28 (×10): 3.375 g via INTRAVENOUS
  Filled 2020-09-25 (×9): qty 50

## 2020-09-25 MED ORDER — SODIUM CHLORIDE 0.9 % IV SOLN
INTRAVENOUS | Status: DC | PRN
  Administered 2020-09-25: 250 mL via INTRAVENOUS

## 2020-09-25 MED ORDER — MORPHINE SULFATE (PF) 2 MG/ML IV SOLN
2.0000 mg | INTRAVENOUS | Status: DC | PRN
  Administered 2020-09-25 – 2020-09-28 (×8): 2 mg via INTRAVENOUS
  Filled 2020-09-25 (×8): qty 1

## 2020-09-25 MED ORDER — LATANOPROST 0.005 % OP SOLN
1.0000 [drp] | Freq: Every day | OPHTHALMIC | Status: DC
  Administered 2020-09-25 – 2020-09-27 (×3): 1 [drp] via OPHTHALMIC
  Filled 2020-09-25 (×2): qty 2.5

## 2020-09-25 MED ORDER — ENOXAPARIN SODIUM 40 MG/0.4ML ~~LOC~~ SOLN
40.0000 mg | SUBCUTANEOUS | Status: DC
  Administered 2020-09-25: 40 mg via SUBCUTANEOUS
  Filled 2020-09-25: qty 0.4

## 2020-09-25 MED ORDER — PIPERACILLIN-TAZOBACTAM 3.375 G IVPB 30 MIN
3.3750 g | Freq: Three times a day (TID) | INTRAVENOUS | Status: DC
Start: 2020-09-25 — End: 2020-09-25

## 2020-09-25 MED ORDER — LOSARTAN POTASSIUM 50 MG PO TABS
100.0000 mg | ORAL_TABLET | Freq: Every day | ORAL | Status: DC
  Administered 2020-09-25 – 2020-09-28 (×4): 100 mg via ORAL
  Filled 2020-09-25 (×4): qty 2
  Filled 2020-09-25: qty 4

## 2020-09-25 NOTE — Progress Notes (Signed)
PROGRESS NOTE    Sergio Griffin  ZOX:[id number] DOB: October 12, 1951 DOA: 09/24/2020 PCP: Moshe Cipro, MD   Brief Narrative:  Per HPI: Sergio Griffin is a 69 y.o. male with medical history significant for morbid obesity, hypertension, CAD status post stent placement, glaucoma, OSA (not on CPAP at this time) and history of nephrolithiasis who presents to the emergency department due to diarrhea, abdominal pain and nausea.  Patient complained of several weeks of left lower quadrant abdominal pain which was tolerable, this morning around 9 AM, the pain has progressed to the left upper quadrant and was associated with diarrhea.  Patient states that he has had more than 20 episodes of loose bowel movement since onset and he has had 4 loose bowel movement since arrival at the ED, pain was continuous, dull undulating in intensity with maximal being 8/10 on pain scale and minimal 5/8 on pain scale.  He also endorsed nausea with decreased appetite, patient denies eating out, having any sick contacts, any new medication, fever, chest pain or shortness of breath.  ED Course:  In the emergency department, HR ranged within 50s to low 60s, otherwise, he was hemodynamically stable.  Work-up in the ED showed leukocytosis, hypokalemia, BUN to creatinine 37/1.60 (baseline creatinine 1.50), hyperglycemia.  Respiratory panel for influenza A, B and SARS coronavirus 2 was negative.  CT abdomen and pelvis with contrast showed findings suggestive of mild sigmoid colonic diverticulitis.  Patient was treated with IV hydration, was initially started on IV ceftriaxone and metronidazole, but patient had a reaction to IV Cipro, this was stopped, Benadryl was given, and patient was started on IV Zosyn.  Hospitalist was asked to admit patient for further evaluation and management.   Assessment & Plan:   Active Problems:   Hyperkalemia   Abdominal pain   Essential hypertension   Diverticulitis   Nausea   Diarrhea    Leukocytosis   CKD (chronic kidney disease), stage III (HCC)   OSA (obstructive sleep apnea)   Dehydration   Hyperglycemia   Acute diverticulitis -CT abdomen pelvis demonstrating mild sigmoid colonic diverticulitis -Continue treatment with IV normal saline and IV Zosyn -Morphine and Zofran ordered for symptomatic treatment of pain and nausea respectively -Advance to full diet -C. difficile and GI panel to be collected -Follow CBC to monitor leukocytosis  Hyperkalemia-resolved -Continue monitoring in a.m.  Hyperglycemia -No prior history of type 2 diabetes -Hemoglobin A1c pending  CKD stage III -Baseline creatinine approximately 1.5-1.6 -Currently stable, continue IV fluid hydration and monitor  Essential hypertension -Currently stable -Continue home medications  CAD status post stent placement -Continue home prasugrel and Toprol -Continue nitroglycerin sublingual as needed  OSA not on CPAP -Stable  Morbid obesity -Lifestyle changes    DVT prophylaxis: Lovenox Code Status: Full code Family Communication: Patient will call, none at bedside Disposition Plan:  Status is: Inpatient  Remains inpatient appropriate because:IV treatments appropriate due to intensity of illness or inability to take PO and Inpatient level of care appropriate due to severity of illness   Dispo: The patient is from: Home              Anticipated d/c is to: Home              Anticipated d/c date is: 2 days              Patient currently is not medically stable to d/c.  Patient requires ongoing treatment with IV fluid and IV antibiotics for his diverticulitis.  Consultants:   None  Procedures:   See below  Antimicrobials:  Anti-infectives (From admission, onward)   Start     Dose/Rate Route Frequency Ordered Stop   09/25/20 0800  piperacillin-tazobactam (ZOSYN) IVPB 3.375 g        3.375 g 12.5 mL/hr over 240 Minutes Intravenous Every 8 hours 09/25/20 0053     09/25/20 0100   piperacillin-tazobactam (ZOSYN) IVPB 3.375 g  Status:  Discontinued        3.375 g 100 mL/hr over 30 Minutes Intravenous Every 8 hours 09/25/20 0048 09/25/20 0053   09/24/20 2315  piperacillin-tazobactam (ZOSYN) IVPB 3.375 g        3.375 g 100 mL/hr over 30 Minutes Intravenous  Once 09/24/20 2314 09/25/20 0049   09/24/20 2245  ciprofloxacin (CIPRO) IVPB 400 mg        400 mg 200 mL/hr over 60 Minutes Intravenous  Once 09/24/20 2241 09/24/20 2305   09/24/20 2245  metroNIDAZOLE (FLAGYL) IVPB 500 mg        500 mg 100 mL/hr over 60 Minutes Intravenous  Once 09/24/20 2241 09/25/20 0010       Subjective: Patient seen and evaluated today with some ongoing mild abdominal pain, that is slowly improving.  He continues to have frequent bowel movements approximately 2-3 every hour.  He denies any nausea or vomiting.  Objective: Vitals:   09/25/20 0830 09/25/20 0900 09/25/20 0930 09/25/20 1001  BP: 129/62 126/67 (!) 132/46 (!) 113/44  Pulse: 66 63 70 65  Resp: 19 19  18   Temp:      TempSrc:      SpO2: 94% 96% 91% 91%  Weight:      Height:        Intake/Output Summary (Last 24 hours) at 09/25/2020 1012 Last data filed at 09/25/2020 0901 Gross per 24 hour  Intake 2000 ml  Output --  Net 2000 ml   Filed Weights   09/24/20 1922  Weight: (!) 150.6 kg    Examination:  General exam: Appears calm and comfortable, obese Respiratory system: Clear to auscultation. Respiratory effort normal. Cardiovascular system: S1 & S2 heard, RRR.  Gastrointestinal system: Abdomen is nondistended, soft and tender to palpation over the lower quadrants. Central nervous system: Alert and oriented. No focal neurological deficits. Extremities: Symmetric 5 x 5 power. Skin: No rashes, lesions or ulcers Psychiatry: Judgement and insight appear normal. Mood & affect appropriate.     Data Reviewed: I have personally reviewed following labs and imaging studies  CBC: Recent Labs  Lab 09/24/20 1953  09/25/20 0325  WBC 11.6* 10.7*  HGB 16.7 15.4  HCT 54.4* 50.7  MCV 96.8 98.4  PLT 264 967   Basic Metabolic Panel: Recent Labs  Lab 09/24/20 1953 09/25/20 0325  NA 134* 135  K 5.5* 4.9  CL 104 104  CO2 21* 23  GLUCOSE 173* 130*  BUN 37* 32*  CREATININE 1.60* 1.66*  CALCIUM 9.4 8.8*  MG  --  2.0  PHOS  --  2.7   GFR: Estimated Creatinine Clearance: 61.7 mL/min (A) (by C-G formula based on SCr of 1.66 mg/dL (H)). Liver Function Tests: Recent Labs  Lab 09/24/20 1953 09/25/20 0325  AST 20 15  ALT 29 24  ALKPHOS 45 40  BILITOT 0.7 0.8  PROT 7.8 7.3  ALBUMIN 4.2 3.9   Recent Labs  Lab 09/24/20 1953  LIPASE 27   No results for input(s): AMMONIA in the last 168 hours. Coagulation Profile: Recent Labs  Lab  09/25/20 0325  INR 1.1   Cardiac Enzymes: No results for input(s): CKTOTAL, CKMB, CKMBINDEX, TROPONINI in the last 168 hours. BNP (last 3 results) No results for input(s): PROBNP in the last 8760 hours. HbA1C: No results for input(s): HGBA1C in the last 72 hours. CBG: No results for input(s): GLUCAP in the last 168 hours. Lipid Profile: No results for input(s): CHOL, HDL, LDLCALC, TRIG, CHOLHDL, LDLDIRECT in the last 72 hours. Thyroid Function Tests: No results for input(s): TSH, T4TOTAL, FREET4, T3FREE, THYROIDAB in the last 72 hours. Anemia Panel: No results for input(s): VITAMINB12, FOLATE, FERRITIN, TIBC, IRON, RETICCTPCT in the last 72 hours. Sepsis Labs: No results for input(s): PROCALCITON, LATICACIDVEN in the last 168 hours.  Recent Results (from the past 240 hour(s))  Respiratory Panel by RT PCR (Flu A&B, Covid) - Nasopharyngeal Swab     Status: None   Collection Time: 09/25/20 12:00 AM   Specimen: Nasopharyngeal Swab  Result Value Ref Range Status   SARS Coronavirus 2 by RT PCR NEGATIVE NEGATIVE Final    Comment: (NOTE) SARS-CoV-2 target nucleic acids are NOT DETECTED.  The SARS-CoV-2 RNA is generally detectable in upper  respiratoy specimens during the acute phase of infection. The lowest concentration of SARS-CoV-2 viral copies this assay can detect is 131 copies/mL. A negative result does not preclude SARS-Cov-2 infection and should not be used as the sole basis for treatment or other patient management decisions. A negative result may occur with  improper specimen collection/handling, submission of specimen other than nasopharyngeal swab, presence of viral mutation(s) within the areas targeted by this assay, and inadequate number of viral copies (<131 copies/mL). A negative result must be combined with clinical observations, patient history, and epidemiological information. The expected result is Negative.  Fact Sheet for Patients:  PinkCheek.be  Fact Sheet for Healthcare Providers:  GravelBags.it  This test is no t yet approved or cleared by the [place] FDA and  has been authorized for detection and/or diagnosis of SARS-CoV-2 by FDA under an Emergency Use Authorization (EUA). This EUA will remain  in effect (meaning this test can be used) for the duration of the COVID-19 declaration under Section 564(b)(1) of the Act, 21 U.S.C. section 360bbb-3(b)(1), unless the authorization is terminated or revoked sooner.     Influenza A by PCR NEGATIVE NEGATIVE Final   Influenza B by PCR NEGATIVE NEGATIVE Final    Comment: (NOTE) The Xpert Xpress SARS-CoV-2/FLU/RSV assay is intended as an aid in  the diagnosis of influenza from Nasopharyngeal swab specimens and  should not be used as a sole basis for treatment. Nasal washings and  aspirates are unacceptable for Xpert Xpress SARS-CoV-2/FLU/RSV  testing.  Fact Sheet for Patients: PinkCheek.be  Fact Sheet for Healthcare Providers: GravelBags.it  This test is not yet approved or cleared by the [place] FDA and  has been  authorized for detection and/or diagnosis of SARS-CoV-2 by  FDA under an Emergency Use Authorization (EUA). This EUA will remain  in effect (meaning this test can be used) for the duration of the  Covid-19 declaration under Section 564(b)(1) of the Act, 21  U.S.C. section 360bbb-3(b)(1), unless the authorization is  terminated or revoked. Performed at [facility], [address], [place], [place] [address]   Culture, blood (Routine X 2) w Reflex to ID Panel     Status: None (Preliminary result)   Collection Time: 09/25/20  3:24 AM   Specimen: Right Antecubital; Blood  Result Value Ref Range Status   Specimen Description  RIGHT ANTECUBITAL  Final   Special Requests   Final    BOTTLES DRAWN AEROBIC AND ANAEROBIC Blood Culture adequate volume   Culture   Final    NO GROWTH < 12 HOURS Performed at [facility], [address], [place], [place] [address]    Report Status PENDING  Incomplete  Culture, blood (Routine X 2) w Reflex to ID Panel     Status: None (Preliminary result)   Collection Time: 09/25/20  3:35 AM   Specimen: BLOOD RIGHT HAND  Result Value Ref Range Status   Specimen Description BLOOD RIGHT HAND  Final   Special Requests   Final    BOTTLES DRAWN AEROBIC AND ANAEROBIC Blood Culture adequate volume   Culture   Final    NO GROWTH < 12 HOURS Performed at [facility], [address], [place], [place] [address]    Report Status PENDING  Incomplete         Radiology Studies: CT ABDOMEN PELVIS W CONTRAST  Result Date: 09/24/2020 CLINICAL DATA:  Lower abdominal pain EXAM: CT ABDOMEN AND PELVIS WITH CONTRAST TECHNIQUE: Multidetector CT imaging of the abdomen and pelvis was performed using the standard protocol following bolus administration of intravenous contrast. CONTRAST:  150mL OMNIPAQUE IOHEXOL 300 MG/ML  SOLN COMPARISON:  October 07, 2019 FINDINGS: Lower chest: The visualized heart size within normal limits. No pericardial fluid/thickening. No hiatal hernia. The  visualized portions of the lungs are clear. Hepatobiliary: There is diffuse low density seen throughout the liver parenchyma.The main portal vein is patent. No evidence of calcified gallstones, gallbladder wall thickening or biliary dilatation. Pancreas: Unremarkable. No pancreatic ductal dilatation or surrounding inflammatory changes. Spleen: Normal in size without focal abnormality. Adrenals/Urinary Tract: Both adrenal glands appear normal. Calcifications are seen throughout the left kidney the largest clustered within the lower pole measuring 8 mm. No hydronephrosis. Bladder is unremarkable. Stomach/Bowel: The stomach, small bowel are normal in appearance. There is question of mild wall thickening seen within the sigmoid colon with minimal surrounding fat stranding changes. Significant scattered colonic diverticula are noted. No pericolonic free fluid or loculated fluid collections. No inflammatory changes, wall thickening, or obstructive findings.The appendix is normal. Vascular/Lymphatic: There are no enlarged mesenteric, retroperitoneal, or pelvic lymph nodes. Scattered aortic atherosclerotic calcifications are seen without aneurysmal dilatation. Reproductive: The prostate is unremarkable. Other: No evidence of abdominal wall mass or hernia. Musculoskeletal: No acute or significant osseous findings. IMPRESSION: Findings which could be suggestive of mild sigmoid colonic diverticulitis. No pericolonic loculated fluid collections or free air. Nonobstructing left renal calculi. Hepatic steatosis Aortic Atherosclerosis (ICD10-I70.0). Electronically Signed   By: Prudencio Pair M.D.   On: 09/24/2020 22:01        Scheduled Meds: . amLODipine  5 mg Oral Daily  . enoxaparin (LOVENOX) injection  40 mg Subcutaneous Q24H  . latanoprost  1 drop Both Eyes QHS  . losartan  100 mg Oral Daily  . metoprolol succinate  12.5-25 mg Oral See admin instructions  . prasugrel  10 mg Oral QHS   Continuous Infusions: .  piperacillin-tazobactam (ZOSYN)  IV 3.375 g (09/25/20 0900)     LOS: 1 day    Time spent: 35 minutes    Chasady Longwell Darleen Crocker, DO [facility]  If 7PM-7AM, please contact night-coverage [email or web address] 09/25/2020, 10:12 AM

## 2020-09-25 NOTE — ED Notes (Signed)
Pt up to bathroom and back to room without assistance- steady gait

## 2020-09-26 LAB — CBC
HCT: 46.6 % (ref 39.0–52.0)
Hemoglobin: 13.8 g/dL (ref 13.0–17.0)
MCH: 29.1 pg (ref 26.0–34.0)
MCHC: 29.6 g/dL — ABNORMAL LOW (ref 30.0–36.0)
MCV: 98.3 fL (ref 80.0–100.0)
Platelets: 212 10*3/uL (ref 150–400)
RBC: 4.74 MIL/uL (ref 4.22–5.81)
RDW: 13.7 % (ref 11.5–15.5)
WBC: 9.5 10*3/uL (ref 4.0–10.5)
nRBC: 0 % (ref 0.0–0.2)

## 2020-09-26 LAB — GLUCOSE, CAPILLARY: Glucose-Capillary: 125 mg/dL — ABNORMAL HIGH (ref 70–99)

## 2020-09-26 LAB — COMPREHENSIVE METABOLIC PANEL
ALT: 24 U/L (ref 0–44)
AST: 16 U/L (ref 15–41)
Albumin: 3.5 g/dL (ref 3.5–5.0)
Alkaline Phosphatase: 36 U/L — ABNORMAL LOW (ref 38–126)
Anion gap: 8 (ref 5–15)
BUN: 20 mg/dL (ref 8–23)
CO2: 26 mmol/L (ref 22–32)
Calcium: 8.2 mg/dL — ABNORMAL LOW (ref 8.9–10.3)
Chloride: 102 mmol/L (ref 98–111)
Creatinine, Ser: 1.52 mg/dL — ABNORMAL HIGH (ref 0.61–1.24)
GFR, Estimated: 49 mL/min — ABNORMAL LOW (ref >60)
Glucose, Bld: 132 mg/dL — ABNORMAL HIGH (ref 70–99)
Potassium: 4.1 mmol/L (ref 3.5–5.1)
Sodium: 136 mmol/L (ref 135–145)
Total Bilirubin: 0.7 mg/dL (ref 0.3–1.2)
Total Protein: 6.4 g/dL — ABNORMAL LOW (ref 6.5–8.1)

## 2020-09-26 MED ORDER — BUPROPION HCL ER (XL) 300 MG PO TB24
300.0000 mg | ORAL_TABLET | Freq: Every day | ORAL | Status: DC
  Filled 2020-09-26: qty 1

## 2020-09-26 MED ORDER — METOPROLOL SUCCINATE ER 25 MG PO TB24
12.5000 mg | ORAL_TABLET | Freq: Every day | ORAL | Status: DC
  Administered 2020-09-26 – 2020-09-27 (×2): 12.5 mg via ORAL
  Filled 2020-09-26 (×2): qty 1

## 2020-09-26 MED ORDER — SIMETHICONE 80 MG PO CHEW: 80.0000 mg | CHEWABLE_TABLET | Freq: Four times a day (QID) | ORAL | Status: DC | PRN

## 2020-09-26 MED ORDER — METOPROLOL SUCCINATE ER 25 MG PO TB24
25.0000 mg | ORAL_TABLET | Freq: Every day | ORAL | Status: DC
  Administered 2020-09-26 – 2020-09-28 (×3): 25 mg via ORAL
  Filled 2020-09-26 (×4): qty 1

## 2020-09-26 MED ORDER — SUCRALFATE 1 G PO TABS
1.0000 g | ORAL_TABLET | Freq: Every day | ORAL | Status: DC
  Administered 2020-09-26 – 2020-09-27 (×2): 1 g via ORAL
  Filled 2020-09-26 (×2): qty 1

## 2020-09-26 MED ORDER — ASPIRIN EC 81 MG PO TBEC
81.0000 mg | DELAYED_RELEASE_TABLET | Freq: Every day | ORAL | Status: DC
  Administered 2020-09-26 – 2020-09-27 (×2): 81 mg via ORAL
  Filled 2020-09-26 (×2): qty 1

## 2020-09-26 MED ORDER — CLONAZEPAM 0.5 MG PO TABS
0.5000 mg | ORAL_TABLET | Freq: Two times a day (BID) | ORAL | Status: DC | PRN
  Administered 2020-09-26: 0.5 mg via ORAL
  Filled 2020-09-26: qty 1

## 2020-09-26 MED ORDER — BUPROPION HCL ER (XL) 300 MG PO TB24
300.0000 mg | ORAL_TABLET | Freq: Every day | ORAL | Status: DC
  Administered 2020-09-26 – 2020-09-27 (×2): 300 mg via ORAL
  Filled 2020-09-26 (×2): qty 1

## 2020-09-26 MED ORDER — BUSPIRONE HCL 5 MG PO TABS
15.0000 mg | ORAL_TABLET | Freq: Two times a day (BID) | ORAL | Status: DC
  Administered 2020-09-26 – 2020-09-28 (×5): 15 mg via ORAL
  Filled 2020-09-26 (×6): qty 3

## 2020-09-26 NOTE — Progress Notes (Signed)
Patient c/o heart rate being irregular and " skipping a beat."  Vital signs are WNL and EKG done per protocol.  EKG showed NS rhythm with PACs.  Patient does have a history of MI x 3.  Patient also c/o being sob.  Notified provider.  Received order for cardiac monitoring and chest x-ray.  Will continue to monitor patient.

## 2020-09-26 NOTE — Progress Notes (Signed)
PROGRESS NOTE    Sergio Griffin  LZJ:[id number] DOB: 1951-04-21 DOA: 09/24/2020 PCP: Moshe Cipro, MD   Brief Narrative:  Per HPI: Sergio Griffin a 69 y.o.malewith medical history significant formorbid obesity, hypertension, CAD status post stent placement, glaucoma, OSA (not on CPAP at this time) and history of nephrolithiasis who presents to the emergency department due to diarrhea, abdominal pain and nausea. Patient complained of several weeks of left lower quadrant abdominal pain which was tolerable, this morning around 9 AM, the pain has progressed to the left upper quadrant and was associated with diarrhea.Patient states that he has had more than 20 episodes of loose bowel movement since onset and he has had 4 loose bowel movement since arrival at the ED, pain was continuous, dull undulating in intensity with maximal being 8/10 on pain scale and minimal 5/8 on pain scale. He also endorsed nausea with decreased appetite, patient denies eating out, having any sick contacts, any new medication, fever, chest pain or shortness of breath.  ED Course: In the emergency department, HR ranged within 50s to low 60s, otherwise, he was hemodynamically stable. Work-up in the ED showed leukocytosis, hypokalemia, BUN to creatinine 37/1.60 (baseline creatinine 1.50), hyperglycemia. Respiratory panel for influenza A, B and SARS coronavirus 2 was negative. CT abdomen and pelvis with contrast showed findings suggestive of mild sigmoid colonic diverticulitis. Patient was treated with IV hydration, was initially started on IV ceftriaxone and metronidazole, but patient had a reaction to IV Cipro, this was stopped, Benadryl was given, and patient was started on IV Zosyn. Hospitalist was asked to admit patient for further evaluation and management.  11/7-11/8: Patient has been admitted with acute diverticulitis and is undergoing treatment with IV Zosyn empirically with improvement beginning to be  noted.  He is having very minimal bowel movements and has had 3 overnight.  Stool C. difficile and GI panel pending.  Assessment & Plan:   Active Problems:   Hyperkalemia   Abdominal pain   Essential hypertension   Diverticulitis   Nausea   Diarrhea   Leukocytosis   CKD (chronic kidney disease), stage III (HCC)   OSA (obstructive sleep apnea)   Dehydration   Hyperglycemia   Acute diverticulitis-improving -CT abdomen pelvis demonstrating mild sigmoid colonic diverticulitis -Continue treatment with IV Zosyn and hold further IV fluid -Morphine and Zofran ordered for symptomatic treatment of pain and nausea respectively -Continue current diet -C. difficile and GI panel to be collected and pending -Follow CBC to monitor leukocytosis which is improving  Hyperkalemia-resolved -Continue monitoring in a.m.  Hyperglycemia-improving -No prior history of type 2 diabetes -Hemoglobin A1c 6.6% -He will need outpatient follow-up for initiation of medications once over acute illness  CKD stage III -Baseline creatinine approximately 1.5-1.6 -Currently stable, continue to monitor with no further need for IV hydration -Avoid home Lasix for now  Essential hypertension -Currently stable -Continue home medications  CAD status post stent placement -Continue home prasugrel and Toprol -Continue home aspirin -Continue nitroglycerin sublingual as needed  Anxiety -Restart home medications  OSA not on CPAP -Stable  Morbid obesity -Lifestyle changes    DVT prophylaxis: Lovenox Code Status: Full code Family Communication: Patient will call, none at bedside Disposition Plan:  Status is: Inpatient  Remains inpatient appropriate because:IV treatments appropriate due to intensity of illness or inability to take PO and Inpatient level of care appropriate due to severity of illness   Dispo: The patient is from: Home  Anticipated d/c is to: Home   Anticipated d/c  date is: 1 days  Patient currently is not medically stable to d/c.  Patient requires ongoing treatment with IV fluid and IV antibiotics for his diverticulitis.  Anticipate discharge in a.m. if stabilized.   Consultants:   None  Procedures:   See below  Antimicrobials:  Anti-infectives (From admission, onward)   Start     Dose/Rate Route Frequency Ordered Stop   09/25/20 0800  piperacillin-tazobactam (ZOSYN) IVPB 3.375 g        3.375 g 12.5 mL/hr over 240 Minutes Intravenous Every 8 hours 09/25/20 0053     09/25/20 0100  piperacillin-tazobactam (ZOSYN) IVPB 3.375 g  Status:  Discontinued        3.375 g 100 mL/hr over 30 Minutes Intravenous Every 8 hours 09/25/20 0048 09/25/20 0053   09/24/20 2315  piperacillin-tazobactam (ZOSYN) IVPB 3.375 g        3.375 g 100 mL/hr over 30 Minutes Intravenous  Once 09/24/20 2314 09/25/20 0049   09/24/20 2245  ciprofloxacin (CIPRO) IVPB 400 mg        400 mg 200 mL/hr over 60 Minutes Intravenous  Once 09/24/20 2241 09/24/20 2305   09/24/20 2245  metroNIDAZOLE (FLAGYL) IVPB 500 mg        500 mg 100 mL/hr over 60 Minutes Intravenous  Once 09/24/20 2241 09/25/20 0010       Subjective: Patient seen and evaluated today with improvement in abdominal pain and cramping noted.  He has only had 3 bowel movements overnight and they are more formed.  He is complaining of some gassiness.  Objective: Vitals:   09/25/20 2100 09/26/20 0100 09/26/20 0113 09/26/20 0504  BP: (!) 142/74 (!) 138/57 (!) 138/57 129/60  Pulse: 81 77 75 72  Resp: 18 18  18   Temp: 98 F (36.7 C) 98.1 F (36.7 C) 98.1 F (36.7 C) 98.1 F (36.7 C)  TempSrc: Oral Oral Oral Oral  SpO2: 95% 95% 96% 90%  Weight:      Height:        Intake/Output Summary (Last 24 hours) at 09/26/2020 8676 Last data filed at 09/26/2020 0300 Gross per 24 hour  Intake 298.11 ml  Output --  Net 298.11 ml   Filed Weights   09/24/20 1922  Weight: (!) 150.6 kg     Examination:  General exam: Appears calm and comfortable, obese Respiratory system: Clear to auscultation. Respiratory effort normal. Cardiovascular system: S1 & S2 heard, RRR. Gastrointestinal system: Abdomen is nondistended, soft and tender to palpation over lower quadrants. Central nervous system: Alert and oriented. No focal neurological deficits. Extremities: Symmetric 5 x 5 power. Skin: No rashes, lesions or ulcers Psychiatry: Judgement and insight appear normal. Mood & affect appropriate.     Data Reviewed: I have personally reviewed following labs and imaging studies  CBC: Recent Labs  Lab 09/24/20 1953 09/25/20 0325 09/26/20 0713  WBC 11.6* 10.7* 9.5  HGB 16.7 15.4 13.8  HCT 54.4* 50.7 46.6  MCV 96.8 98.4 98.3  PLT 264 272 195   Basic Metabolic Panel: Recent Labs  Lab 09/24/20 1953 09/25/20 0325 09/26/20 0713  NA 134* 135 136  K 5.5* 4.9 4.1  CL 104 104 102  CO2 21* 23 26  GLUCOSE 173* 130* 132*  BUN 37* 32* 20  CREATININE 1.60* 1.66* 1.52*  CALCIUM 9.4 8.8* 8.2*  MG  --  2.0  --   PHOS  --  2.7  --    GFR: Estimated Creatinine Clearance: 67.4 mL/min (A) (by C-G formula based on  SCr of 1.52 mg/dL (H)). Liver Function Tests: Recent Labs  Lab 09/24/20 1953 09/25/20 0325 09/26/20 0713  AST 20 15 16   ALT 29 24 24   ALKPHOS 45 40 36*  BILITOT 0.7 0.8 0.7  PROT 7.8 7.3 6.4*  ALBUMIN 4.2 3.9 3.5   Recent Labs  Lab 09/24/20 1953  LIPASE 27   No results for input(s): AMMONIA in the last 168 hours. Coagulation Profile: Recent Labs  Lab 09/25/20 0325  INR 1.1   Cardiac Enzymes: No results for input(s): CKTOTAL, CKMB, CKMBINDEX, TROPONINI in the last 168 hours. BNP (last 3 results) No results for input(s): PROBNP in the last 8760 hours. HbA1C: Recent Labs    09/25/20 0326  HGBA1C 6.6*   CBG: Recent Labs  Lab 09/26/20 0447  GLUCAP 125*   Lipid Profile: No results for input(s): CHOL, HDL, LDLCALC, TRIG, CHOLHDL, LDLDIRECT in the  last 72 hours. Thyroid Function Tests: No results for input(s): TSH, T4TOTAL, FREET4, T3FREE, THYROIDAB in the last 72 hours. Anemia Panel: No results for input(s): VITAMINB12, FOLATE, FERRITIN, TIBC, IRON, RETICCTPCT in the last 72 hours. Sepsis Labs: No results for input(s): PROCALCITON, LATICACIDVEN in the last 168 hours.  Recent Results (from the past 240 hour(s))  Respiratory Panel by RT PCR (Flu A&B, Covid) - Nasopharyngeal Swab     Status: None   Collection Time: 09/25/20 12:00 AM   Specimen: Nasopharyngeal Swab  Result Value Ref Range Status   SARS Coronavirus 2 by RT PCR NEGATIVE NEGATIVE Final    Comment: (NOTE) SARS-CoV-2 target nucleic acids are NOT DETECTED.  The SARS-CoV-2 RNA is generally detectable in upper respiratoy specimens during the acute phase of infection. The lowest concentration of SARS-CoV-2 viral copies this assay can detect is 131 copies/mL. A negative result does not preclude SARS-Cov-2 infection and should not be used as the sole basis for treatment or other patient management decisions. A negative result may occur with  improper specimen collection/handling, submission of specimen other than nasopharyngeal swab, presence of viral mutation(s) within the areas targeted by this assay, and inadequate number of viral copies (<131 copies/mL). A negative result must be combined with clinical observations, patient history, and epidemiological information. The expected result is Negative.  Fact Sheet for Patients:  PinkCheek.be  Fact Sheet for Healthcare Providers:  GravelBags.it  This test is no t yet approved or cleared by the [place] FDA and  has been authorized for detection and/or diagnosis of SARS-CoV-2 by FDA under an Emergency Use Authorization (EUA). This EUA will remain  in effect (meaning this test can be used) for the duration of the COVID-19 declaration under Section 564(b)(1) of  the Act, 21 U.S.C. section 360bbb-3(b)(1), unless the authorization is terminated or revoked sooner.     Influenza A by PCR NEGATIVE NEGATIVE Final   Influenza B by PCR NEGATIVE NEGATIVE Final    Comment: (NOTE) The Xpert Xpress SARS-CoV-2/FLU/RSV assay is intended as an aid in  the diagnosis of influenza from Nasopharyngeal swab specimens and  should not be used as a sole basis for treatment. Nasal washings and  aspirates are unacceptable for Xpert Xpress SARS-CoV-2/FLU/RSV  testing.  Fact Sheet for Patients: PinkCheek.be  Fact Sheet for Healthcare Providers: GravelBags.it  This test is not yet approved or cleared by the [place] FDA and  has been authorized for detection and/or diagnosis of SARS-CoV-2 by  FDA under an Emergency Use Authorization (EUA). This EUA will remain  in effect (meaning this test can be used) for the  duration of the  Covid-19 declaration under Section 564(b)(1) of the Act, 21  U.S.C. section 360bbb-3(b)(1), unless the authorization is  terminated or revoked. Performed at [facility], [address] [place], [place] [address]   Culture, blood (Routine X 2) w Reflex to ID Panel     Status: None (Preliminary result)   Collection Time: 09/25/20  3:24 AM   Specimen: Right Antecubital; Blood  Result Value Ref Range Status   Specimen Description RIGHT ANTECUBITAL  Final   Special Requests   Final    BOTTLES DRAWN AEROBIC AND ANAEROBIC Blood Culture adequate volume   Culture   Final    NO GROWTH 1 DAY Performed at [facility], [address] [place], [place] [address]    Report Status PENDING  Incomplete  Culture, blood (Routine X 2) w Reflex to ID Panel     Status: None (Preliminary result)   Collection Time: 09/25/20  3:35 AM   Specimen: BLOOD RIGHT HAND  Result Value Ref Range Status   Specimen Description BLOOD RIGHT HAND  Final   Special Requests   Final    BOTTLES DRAWN AEROBIC AND  ANAEROBIC Blood Culture adequate volume   Culture   Final    NO GROWTH 1 DAY Performed at [facility], [address] [place], [place] [address]    Report Status PENDING  Incomplete         Radiology Studies: CT ABDOMEN PELVIS W CONTRAST  Result Date: 09/24/2020 CLINICAL DATA:  Lower abdominal pain EXAM: CT ABDOMEN AND PELVIS WITH CONTRAST TECHNIQUE: Multidetector CT imaging of the abdomen and pelvis was performed using the standard protocol following bolus administration of intravenous contrast. CONTRAST:  12mL OMNIPAQUE IOHEXOL 300 MG/ML  SOLN COMPARISON:  October 07, 2019 FINDINGS: Lower chest: The visualized heart size within normal limits. No pericardial fluid/thickening. No hiatal hernia. The visualized portions of the lungs are clear. Hepatobiliary: There is diffuse low density seen throughout the liver parenchyma.The main portal vein is patent. No evidence of calcified gallstones, gallbladder wall thickening or biliary dilatation. Pancreas: Unremarkable. No pancreatic ductal dilatation or surrounding inflammatory changes. Spleen: Normal in size without focal abnormality. Adrenals/Urinary Tract: Both adrenal glands appear normal. Calcifications are seen throughout the left kidney the largest clustered within the lower pole measuring 8 mm. No hydronephrosis. Bladder is unremarkable. Stomach/Bowel: The stomach, small bowel are normal in appearance. There is question of mild wall thickening seen within the sigmoid colon with minimal surrounding fat stranding changes. Significant scattered colonic diverticula are noted. No pericolonic free fluid or loculated fluid collections. No inflammatory changes, wall thickening, or obstructive findings.The appendix is normal. Vascular/Lymphatic: There are no enlarged mesenteric, retroperitoneal, or pelvic lymph nodes. Scattered aortic atherosclerotic calcifications are seen without aneurysmal dilatation. Reproductive: The prostate is unremarkable. Other:  No evidence of abdominal wall mass or hernia. Musculoskeletal: No acute or significant osseous findings. IMPRESSION: Findings which could be suggestive of mild sigmoid colonic diverticulitis. No pericolonic loculated fluid collections or free air. Nonobstructing left renal calculi. Hepatic steatosis Aortic Atherosclerosis (ICD10-I70.0). Electronically Signed   By: Prudencio Pair M.D.   On: 09/24/2020 22:01   DG Chest Port 1 View  Result Date: 09/25/2020 CLINICAL DATA:  Dyspnea on exertion, hypertension, tachycardia EXAM: PORTABLE CHEST 1 VIEW COMPARISON:  06/17/2015 FINDINGS: The heart size and mediastinal contours are within normal limits. Both lungs are clear. The visualized skeletal structures are unremarkable. IMPRESSION: No active disease. Electronically Signed   By: Randa Ngo M.D.   On: 09/25/2020  23:32        Scheduled Meds: . amLODipine  5 mg Oral Daily  . aspirin EC  81 mg Oral QHS  . buPROPion  300 mg Oral Daily  . busPIRone  15 mg Oral BID  . enoxaparin (LOVENOX) injection  75 mg Subcutaneous Q24H  . latanoprost  1 drop Both Eyes QHS  . losartan  100 mg Oral Daily  . metoprolol succinate  25 mg Oral Daily   And  . metoprolol succinate  12.5 mg Oral QHS  . prasugrel  10 mg Oral QHS  . sucralfate  1 g Oral QHS   Continuous Infusions: . sodium chloride 250 mL (09/25/20 1817)  . piperacillin-tazobactam (ZOSYN)  IV 3.375 g (09/25/20 2309)     LOS: 2 days    Time spent: 30 minutes    Serai Tukes Darleen Crocker, DO [facility]  If 7PM-7AM, please contact night-coverage [email or web address] 09/26/2020, 9:17 AM

## 2020-09-27 ENCOUNTER — Ambulatory Visit: Payer: Medicare Other | Admitting: Psychiatry

## 2020-09-27 LAB — CBC
HCT: 44.2 % (ref 39.0–52.0)
Hemoglobin: 13.3 g/dL (ref 13.0–17.0)
MCH: 29.7 pg (ref 26.0–34.0)
MCHC: 30.1 g/dL (ref 30.0–36.0)
MCV: 98.7 fL (ref 80.0–100.0)
Platelets: 211 10*3/uL (ref 150–400)
RBC: 4.48 MIL/uL (ref 4.22–5.81)
RDW: 13.5 % (ref 11.5–15.5)
WBC: 9.8 10*3/uL (ref 4.0–10.5)
nRBC: 0 % (ref 0.0–0.2)

## 2020-09-27 LAB — BASIC METABOLIC PANEL
Anion gap: 9 (ref 5–15)
BUN: 18 mg/dL (ref 8–23)
CO2: 26 mmol/L (ref 22–32)
Calcium: 8.2 mg/dL — ABNORMAL LOW (ref 8.9–10.3)
Chloride: 101 mmol/L (ref 98–111)
Creatinine, Ser: 1.55 mg/dL — ABNORMAL HIGH (ref 0.61–1.24)
GFR, Estimated: 48 mL/min — ABNORMAL LOW (ref >60)
Glucose, Bld: 112 mg/dL — ABNORMAL HIGH (ref 70–99)
Potassium: 4.1 mmol/L (ref 3.5–5.1)
Sodium: 136 mmol/L (ref 135–145)

## 2020-09-27 NOTE — Progress Notes (Signed)
PROGRESS NOTE    Sergio Griffin  YKD:[id number] DOB: December 29, 1950 DOA: 09/24/2020 PCP: Moshe Cipro, MD   Brief Narrative:  Per HPI: Sergio Griffin a 69 y.o.malewith medical history significant formorbid obesity, hypertension, CAD status post stent placement, glaucoma, OSA (not on CPAP at this time) and history of nephrolithiasis who presents to the emergency department due to diarrhea, abdominal pain and nausea. Patient complained of several weeks of left lower quadrant abdominal pain which was tolerable, this morning around 9 AM, the pain has progressed to the left upper quadrant and was associated with diarrhea.Patient states that he has had more than 20 episodes of loose bowel movement since onset and he has had 4 loose bowel movement since arrival at the ED, pain was continuous, dull undulating in intensity with maximal being 8/10 on pain scale and minimal 5/8 on pain scale. He also endorsed nausea with decreased appetite, patient denies eating out, having any sick contacts, any new medication, fever, chest pain or shortness of breath.  ED Course: In the emergency department, HR ranged within 50s to low 60s, otherwise, he was hemodynamically stable. Work-up in the ED showed leukocytosis, hypokalemia, BUN to creatinine 37/1.60 (baseline creatinine 1.50), hyperglycemia. Respiratory panel for influenza A, B and SARS coronavirus 2 was negative. CT abdomen and pelvis with contrast showed findings suggestive of mild sigmoid colonic diverticulitis. Patient was treated with IV hydration, was initially started on IV ceftriaxone and metronidazole, but patient had a reaction to IV Cipro, this was stopped, Benadryl was given, and patient was started on IV Zosyn. Hospitalist was asked to admit patient for further evaluation and management.  11/7-11/9: Patient has been admitted with acute diverticulitis and is undergoing treatment with IV Zosyn empirically with improvement noted thus far.   He has had no further bowel movements in the last 24 hours and is worried about developing constipation, but was assured that this is normal.  He continues to have ongoing moderate intensity lower abdominal pain and does not feel ready for discharge yet.  Stool C. difficile and GI panel canceled as bowel movement frequency has diminished.  Assessment & Plan:   Active Problems:   Hyperkalemia   Abdominal pain   Essential hypertension   Diverticulitis   Nausea   Diarrhea   Leukocytosis   CKD (chronic kidney disease), stage III (HCC)   OSA (obstructive sleep apnea)   Dehydration   Hyperglycemia  Acute diverticulitis-slowly improving -CT abdomen pelvis demonstrating mild sigmoid colonic diverticulitis on admission -Continue treatment with IV Zosyn  -Morphine and Zofran ordered for symptomatic treatment of pain and nausea respectively -Continue current diet-well tolerated -C. difficile and GI panel canceled due to no further bowel movements -Follow CBC to monitor leukocytosis which has resolved -Anticipate discharge in a.m. if further improved on oral Augmentin to finish course of treatment.  Hyperkalemia-resolved -Continue monitoring in a.m.  Hyperglycemia-resolved -No prior history of type 2 diabetes -Hemoglobin A1c 6.6% -He will need outpatient follow-up for initiation of medications once over acute illness  CKD stage III -Baseline creatinine approximately 1.5-1.6 -Currently stable, continue to monitor with no further need for IV hydration -Avoid home Lasix for now and may resume on discharge  Essential hypertension -Currently stable -Continue home medications  CAD status post stent placement -Continue home prasugrel and Toprol -Continue home aspirin -Continue nitroglycerin sublingual as needed  Anxiety -Restart home medications  OSA not on CPAP -Stable  Morbid obesity -Lifestyle changes    DVT prophylaxis:Lovenox Code Status:Full code Family  Communication:Patient talking  to son, none at bedside Disposition Plan: Status is: Inpatient  Remains inpatient appropriate because:IV treatments appropriate due to intensity of illness or inability to take PO and Inpatient level of care appropriate due to severity of illness   Dispo: The patient is from:Home Anticipated d/c is CW:[facility] Anticipated d/c date is: 1 days Patient currently is not medically stable to d/c.Patient requires ongoing treatment with IV antibiotics for his diverticulitis until further symptomatic improvement noted.  Anticipate discharge in a.m. if stabilized.   Consultants:  None  Procedures:  See below  Antimicrobials:  Anti-infectives (From admission, onward)   Start     Dose/Rate Route Frequency Ordered Stop   09/25/20 0800  piperacillin-tazobactam (ZOSYN) IVPB 3.375 g        3.375 g 12.5 mL/hr over 240 Minutes Intravenous Every 8 hours 09/25/20 0053     09/25/20 0100  piperacillin-tazobactam (ZOSYN) IVPB 3.375 g  Status:  Discontinued        3.375 g 100 mL/hr over 30 Minutes Intravenous Every 8 hours 09/25/20 0048 09/25/20 0053   09/24/20 2315  piperacillin-tazobactam (ZOSYN) IVPB 3.375 g        3.375 g 100 mL/hr over 30 Minutes Intravenous  Once 09/24/20 2314 09/25/20 0049   09/24/20 2245  ciprofloxacin (CIPRO) IVPB 400 mg        400 mg 200 mL/hr over 60 Minutes Intravenous  Once 09/24/20 2241 09/24/20 2305   09/24/20 2245  metroNIDAZOLE (FLAGYL) IVPB 500 mg        500 mg 100 mL/hr over 60 Minutes Intravenous  Once 09/24/20 2241 09/25/20 0010       Subjective: Patient seen and evaluated today with no bowel movements noted in the last 24 hours.  He still describes a moderate intensity of pain to his lower abdomen with cramping that is still quite uncomfortable.  He has been able to tolerate diet with no nausea or vomiting.  Objective: Vitals:   09/26/20 1532 09/26/20 2057 09/27/20 0521  09/27/20 0840  BP: (!) 104/48 (!) 142/80 112/64 (!) 145/80  Pulse: 74 79 61 65  Resp: 20 20 20 18   Temp:  98.4 F (36.9 C) 97.7 F (36.5 C) 98.3 F (36.8 C)  TempSrc:  Oral Oral Oral  SpO2: 95% 91% 94% 95%  Weight:      Height:        Intake/Output Summary (Last 24 hours) at 09/27/2020 0850 Last data filed at 09/26/2020 1837 Gross per 24 hour  Intake 960 ml  Output 2 ml  Net 958 ml   Filed Weights   09/24/20 1922  Weight: (!) 150.6 kg    Examination:  General exam: Appears calm and comfortable, obese Respiratory system: Clear to auscultation. Respiratory effort normal. Cardiovascular system: S1 & S2 heard, RRR.  Gastrointestinal system: Abdomen is nondistended, soft and tender to palpation over lower quadrants Central nervous system: Alert and oriented. No focal neurological deficits. Extremities: No edema Skin: No rashes, lesions or ulcers Psychiatry: Judgement and insight appear normal. Mood & affect appropriate.     Data Reviewed: I have personally reviewed following labs and imaging studies  CBC: Recent Labs  Lab 09/24/20 1953 09/25/20 0325 09/26/20 0713 09/27/20 0615  WBC 11.6* 10.7* 9.5 9.8  HGB 16.7 15.4 13.8 13.3  HCT 54.4* 50.7 46.6 44.2  MCV 96.8 98.4 98.3 98.7  PLT 264 272 212 283   Basic Metabolic Panel: Recent Labs  Lab 09/24/20 1953 09/25/20 0325 09/26/20 0713 09/27/20 0615  NA 134* 135 136  136  K 5.5* 4.9 4.1 4.1  CL 104 104 102 101  CO2 21* 23 26 26   GLUCOSE 173* 130* 132* 112*  BUN 37* 32* 20 18  CREATININE 1.60* 1.66* 1.52* 1.55*  CALCIUM 9.4 8.8* 8.2* 8.2*  MG  --  2.0  --   --   PHOS  --  2.7  --   --    GFR: Estimated Creatinine Clearance: 66.1 mL/min (A) (by C-G formula based on SCr of 1.55 mg/dL (H)). Liver Function Tests: Recent Labs  Lab 09/24/20 1953 09/25/20 0325 09/26/20 0713  AST 20 15 16   ALT 29 24 24   ALKPHOS 45 40 36*  BILITOT 0.7 0.8 0.7  PROT 7.8 7.3 6.4*  ALBUMIN 4.2 3.9 3.5   Recent Labs  Lab  09/24/20 1953  LIPASE 27   No results for input(s): AMMONIA in the last 168 hours. Coagulation Profile: Recent Labs  Lab 09/25/20 0325  INR 1.1   Cardiac Enzymes: No results for input(s): CKTOTAL, CKMB, CKMBINDEX, TROPONINI in the last 168 hours. BNP (last 3 results) No results for input(s): PROBNP in the last 8760 hours. HbA1C: Recent Labs    09/25/20 0326  HGBA1C 6.6*   CBG: Recent Labs  Lab 09/26/20 0447  GLUCAP 125*   Lipid Profile: No results for input(s): CHOL, HDL, LDLCALC, TRIG, CHOLHDL, LDLDIRECT in the last 72 hours. Thyroid Function Tests: No results for input(s): TSH, T4TOTAL, FREET4, T3FREE, THYROIDAB in the last 72 hours. Anemia Panel: No results for input(s): VITAMINB12, FOLATE, FERRITIN, TIBC, IRON, RETICCTPCT in the last 72 hours. Sepsis Labs: No results for input(s): PROCALCITON, LATICACIDVEN in the last 168 hours.  Recent Results (from the past 240 hour(s))  Respiratory Panel by RT PCR (Flu A&B, Covid) - Nasopharyngeal Swab     Status: None   Collection Time: 09/25/20 12:00 AM   Specimen: Nasopharyngeal Swab  Result Value Ref Range Status   SARS Coronavirus 2 by RT PCR NEGATIVE NEGATIVE Final    Comment: (NOTE) SARS-CoV-2 target nucleic acids are NOT DETECTED.  The SARS-CoV-2 RNA is generally detectable in upper respiratoy specimens during the acute phase of infection. The lowest concentration of SARS-CoV-2 viral copies this assay can detect is 131 copies/mL. A negative result does not preclude SARS-Cov-2 infection and should not be used as the sole basis for treatment or other patient management decisions. A negative result may occur with  improper specimen collection/handling, submission of specimen other than nasopharyngeal swab, presence of viral mutation(s) within the areas targeted by this assay, and inadequate number of viral copies (<131 copies/mL). A negative result must be combined with clinical observations, patient history, and  epidemiological information. The expected result is Negative.  Fact Sheet for Patients:  PinkCheek.be  Fact Sheet for Healthcare Providers:  GravelBags.it  This test is no t yet approved or cleared by the [place] FDA and  has been authorized for detection and/or diagnosis of SARS-CoV-2 by FDA under an Emergency Use Authorization (EUA). This EUA will remain  in effect (meaning this test can be used) for the duration of the COVID-19 declaration under Section 564(b)(1) of the Act, 21 U.S.C. section 360bbb-3(b)(1), unless the authorization is terminated or revoked sooner.     Influenza A by PCR NEGATIVE NEGATIVE Final   Influenza B by PCR NEGATIVE NEGATIVE Final    Comment: (NOTE) The Xpert Xpress SARS-CoV-2/FLU/RSV assay is intended as an aid in  the diagnosis of influenza from Nasopharyngeal swab specimens and  should not be used  as a sole basis for treatment. Nasal washings and  aspirates are unacceptable for Xpert Xpress SARS-CoV-2/FLU/RSV  testing.  Fact Sheet for Patients: PinkCheek.be  Fact Sheet for Healthcare Providers: GravelBags.it  This test is not yet approved or cleared by the [place] FDA and  has been authorized for detection and/or diagnosis of SARS-CoV-2 by  FDA under an Emergency Use Authorization (EUA). This EUA will remain  in effect (meaning this test can be used) for the duration of the  Covid-19 declaration under Section 564(b)(1) of the Act, 21  U.S.C. section 360bbb-3(b)(1), unless the authorization is  terminated or revoked. Performed at [facility], [address], [place], [place] [address]   Culture, blood (Routine X 2) w Reflex to ID Panel     Status: None (Preliminary result)   Collection Time: 09/25/20  3:24 AM   Specimen: Right Antecubital; Blood  Result Value Ref Range Status   Specimen Description RIGHT ANTECUBITAL   Final   Special Requests   Final    BOTTLES DRAWN AEROBIC AND ANAEROBIC Blood Culture adequate volume   Culture   Final    NO GROWTH 2 DAYS Performed at [facility], [address] [place], [place] [address]    Report Status PENDING  Incomplete  Culture, blood (Routine X 2) w Reflex to ID Panel     Status: None (Preliminary result)   Collection Time: 09/25/20  3:35 AM   Specimen: BLOOD RIGHT HAND  Result Value Ref Range Status   Specimen Description BLOOD RIGHT HAND  Final   Special Requests   Final    BOTTLES DRAWN AEROBIC AND ANAEROBIC Blood Culture adequate volume   Culture   Final    NO GROWTH 2 DAYS Performed at [facility], [address] [place], [place] [address]    Report Status PENDING  Incomplete         Radiology Studies: DG Chest Port 1 View  Result Date: 09/25/2020 CLINICAL DATA:  Dyspnea on exertion, hypertension, tachycardia EXAM: PORTABLE CHEST 1 VIEW COMPARISON:  06/17/2015 FINDINGS: The heart size and mediastinal contours are within normal limits. Both lungs are clear. The visualized skeletal structures are unremarkable. IMPRESSION: No active disease. Electronically Signed   By: Randa Ngo M.D.   On: 09/25/2020 23:32        Scheduled Meds: . amLODipine  5 mg Oral Daily  . aspirin EC  81 mg Oral QHS  . buPROPion  300 mg Oral Daily  . busPIRone  15 mg Oral BID  . enoxaparin (LOVENOX) injection  75 mg Subcutaneous Q24H  . latanoprost  1 drop Both Eyes QHS  . losartan  100 mg Oral Daily  . metoprolol succinate  25 mg Oral Daily   And  . metoprolol succinate  12.5 mg Oral QHS  . prasugrel  10 mg Oral QHS  . sucralfate  1 g Oral QHS   Continuous Infusions: . sodium chloride 250 mL (09/25/20 1817)  . piperacillin-tazobactam (ZOSYN)  IV 3.375 g (09/27/20 0809)     LOS: 3 days    Time spent: 30 minutes    Evert Wenrich Darleen Crocker, DO [facility]  If 7PM-7AM, please contact night-coverage [email or web address] 09/27/2020, 8:50 AM

## 2020-09-28 DIAGNOSIS — R1084 Generalized abdominal pain: Secondary | ICD-10-CM

## 2020-09-28 DIAGNOSIS — R739 Hyperglycemia, unspecified: Secondary | ICD-10-CM

## 2020-09-28 DIAGNOSIS — K5792 Diverticulitis of intestine, part unspecified, without perforation or abscess without bleeding: Secondary | ICD-10-CM

## 2020-09-28 DIAGNOSIS — G4733 Obstructive sleep apnea (adult) (pediatric): Secondary | ICD-10-CM

## 2020-09-28 DIAGNOSIS — D72829 Elevated white blood cell count, unspecified: Secondary | ICD-10-CM

## 2020-09-28 DIAGNOSIS — E860 Dehydration: Secondary | ICD-10-CM

## 2020-09-28 DIAGNOSIS — E875 Hyperkalemia: Secondary | ICD-10-CM

## 2020-09-28 DIAGNOSIS — I10 Essential (primary) hypertension: Secondary | ICD-10-CM

## 2020-09-28 LAB — BASIC METABOLIC PANEL
Anion gap: 9 (ref 5–15)
BUN: 15 mg/dL (ref 8–23)
CO2: 27 mmol/L (ref 22–32)
Calcium: 8.6 mg/dL — ABNORMAL LOW (ref 8.9–10.3)
Chloride: 100 mmol/L (ref 98–111)
Creatinine, Ser: 1.32 mg/dL — ABNORMAL HIGH (ref 0.61–1.24)
GFR, Estimated: 58 mL/min — ABNORMAL LOW (ref >60)
Glucose, Bld: 115 mg/dL — ABNORMAL HIGH (ref 70–99)
Potassium: 3.8 mmol/L (ref 3.5–5.1)
Sodium: 136 mmol/L (ref 135–145)

## 2020-09-28 LAB — CBC
HCT: 46.2 % (ref 39.0–52.0)
Hemoglobin: 14 g/dL (ref 13.0–17.0)
MCH: 29.9 pg (ref 26.0–34.0)
MCHC: 30.3 g/dL (ref 30.0–36.0)
MCV: 98.5 fL (ref 80.0–100.0)
Platelets: 240 10*3/uL (ref 150–400)
RBC: 4.69 MIL/uL (ref 4.22–5.81)
RDW: 13.4 % (ref 11.5–15.5)
WBC: 11 10*3/uL — ABNORMAL HIGH (ref 4.0–10.5)
nRBC: 0 % (ref 0.0–0.2)

## 2020-09-28 MED ORDER — OXYCODONE HCL 5 MG PO TABS: 5.0000 mg | ORAL_TABLET | Freq: Four times a day (QID) | ORAL | 0 refills | Status: AC | PRN

## 2020-09-28 MED ORDER — INFLUENZA VAC A&B SA ADJ QUAD 0.5 ML IM PRSY
0.5000 mL | PREFILLED_SYRINGE | INTRAMUSCULAR | Status: AC
  Administered 2020-09-28: 0.5 mL via INTRAMUSCULAR
  Filled 2020-09-28: qty 0.5

## 2020-09-28 MED ORDER — SUCRALFATE 1 G PO TABS: 1.0000 g | ORAL_TABLET | Freq: Every day | ORAL | Status: DC

## 2020-09-28 MED ORDER — AMOXICILLIN-POT CLAVULANATE 875-125 MG PO TABS: 1.0000 | ORAL_TABLET | Freq: Two times a day (BID) | ORAL | 0 refills | Status: AC

## 2020-09-28 NOTE — Discharge Instructions (Signed)
Diverticulitis  Diverticulitis is when small pockets in your large intestine (colon) get infected or swollen. This causes stomach pain and watery poop (diarrhea). These pouches are called diverticula. They form in people who have a condition called diverticulosis. Follow these instructions at home: Medicines  Take over-the-counter and prescription medicines only as told by your doctor. These include: ? Antibiotics. ? Pain medicines. ? Fiber pills. ? Probiotics. ? Stool softeners.  Do not drive or use heavy machinery while taking prescription pain medicine.  If you were prescribed an antibiotic, take it as told. Do not stop taking it even if you feel better. General instructions   Follow a diet as told by your doctor.  When you feel better, your doctor may tell you to change your diet. You may need to eat a lot of fiber. Fiber makes it easier to poop (have bowel movements). Healthy foods with fiber include: ? Berries. ? Beans. ? Lentils. ? Green vegetables.  Exercise 3 or more times a week. Aim for 30 minutes each time. Exercise enough to sweat and make your heart beat faster.  Keep all follow-up visits as told. This is important. You may need to have an exam of the large intestine. This is called a colonoscopy. Contact a doctor if:  Your pain does not get better.  You have a hard time eating or drinking.  You are not pooping like normal. Get help right away if:  Your pain gets worse.  Your problems do not get better.  Your problems get worse very fast.  You have a fever.  You throw up (vomit) more than one time.  You have poop that is: ? Bloody. ? Black. ? Tarry. Summary  Diverticulitis is when small pockets in your large intestine (colon) get infected or swollen.  Take medicines only as told by your doctor.  Follow a diet as told by your doctor. This information is not intended to replace advice given to you by your health care provider. Make sure you  discuss any questions you have with your health care provider. Document Revised: 10/18/2017 Document Reviewed: 11/22/2016 Elsevier Patient Education  [place].   IMPORTANT INFORMATION: PAY CLOSE ATTENTION   PHYSICIAN DISCHARGE INSTRUCTIONS  Follow with Primary care provider  Moshe Cipro, MD  and other consultants as instructed by your Hospitalist Physician  [place] IF SYMPTOMS COME BACK, WORSEN OR NEW PROBLEM DEVELOPS   Please note: You were cared for by a hospitalist during your hospital stay. Every effort will be made to forward records to your primary care provider.  You can request that your primary care provider send for your hospital records if they have not received them.  Once you are discharged, your primary care physician will handle any further medical issues. Please note that NO REFILLS for any discharge medications will be authorized once you are discharged, as it is imperative that you return to your primary care physician (or establish a relationship with a primary care physician if you do not have one) for your post hospital discharge needs so that they can reassess your need for medications and monitor your lab values.  Please get a complete blood count and chemistry panel checked by your Primary MD at your next visit, and again as instructed by your Primary MD.  Get Medicines reviewed and adjusted: Please take all your medications with you for your next visit with your Primary MD  Laboratory/radiological data: Please request your Primary MD  to go over all hospital tests and procedure/radiological results at the follow up, please ask your primary care provider to get all Hospital records sent to his/her office.  In some cases, they will be blood work, cultures and biopsy results pending at the time of your discharge. Please request that your primary care provider follow up on these results.  If you are diabetic, please  bring your blood sugar readings with you to your follow up appointment with primary care.    Please call and make your follow up appointments as soon as possible.    Also Note the following: If you experience worsening of your admission symptoms, develop shortness of breath, life threatening emergency, suicidal or homicidal thoughts you must seek medical attention immediately by calling 911 or calling your MD immediately  if symptoms less severe.  You must read complete instructions/literature along with all the possible adverse reactions/side effects for all the Medicines you take and that have been prescribed to you. Take any new Medicines after you have completely understood and accpet all the possible adverse reactions/side effects.   Do not drive when taking Pain medications or sleeping medications (Benzodiazepines)  Do not take more than prescribed Pain, Sleep and Anxiety Medications. It is not advisable to combine anxiety,sleep and pain medications without talking with your primary care practitioner  Special Instructions: If you have smoked or chewed Tobacco  in the last 2 yrs please stop smoking, stop any regular Alcohol  and or any Recreational drug use.  Wear Seat belts while driving.  Do not drive if taking any narcotic, mind altering or controlled substances or recreational drugs or alcohol.

## 2020-09-28 NOTE — Discharge Summary (Signed)
Physician Discharge Summary  Sergio Griffin:[id number] DOB: Mar 29, 1951 DOA: 09/24/2020  PCP: Moshe Cipro, MD  Admit date: 09/24/2020 Discharge date: 09/28/2020  Admitted From:  Home  Disposition:  Home   Recommendations for Outpatient Follow-up:  1. Follow up with PCP in 1 weeks 2. Soft diet recommended.   Discharge Condition: STABLE   CODE STATUS: FULL    Brief Hospitalization Summary: Please see all hospital notes, images, labs for full details of the hospitalization. ADMISSION HPI: Sergio Griffin is a 69 y.o. male with medical history significant for morbid obesity, hypertension, CAD status post stent placement, glaucoma, OSA (not on CPAP at this time) and history of nephrolithiasis who presents to the emergency department due to diarrhea, abdominal pain and nausea.  Patient complained of several weeks of left lower quadrant abdominal pain which was tolerable, this morning around 9 AM, the pain has progressed to the left upper quadrant and was associated with diarrhea.  Patient states that he has had more than 20 episodes of loose bowel movement since onset and he has had 4 loose bowel movement since arrival at the ED, pain was continuous, dull undulating in intensity with maximal being 8/10 on pain scale and minimal 5/8 on pain scale.  He also endorsed nausea with decreased appetite, patient denies eating out, having any sick contacts, any new medication, fever, chest pain or shortness of breath.  ED Course:  In the emergency department, HR ranged within 50s to low 60s, otherwise, he was hemodynamically stable.  Work-up in the ED showed leukocytosis, hypokalemia, BUN to creatinine 37/1.60 (baseline creatinine 1.50), hyperglycemia.  Respiratory panel for influenza A, B and SARS coronavirus 2 was negative.  CT abdomen and pelvis with contrast showed findings suggestive of mild sigmoid colonic diverticulitis.  Patient was treated with IV hydration, was initially started on IV  ceftriaxone and metronidazole, but patient had a reaction to IV Cipro, this was stopped, Benadryl was given, and patient was started on IV Zosyn.  Hospitalist was asked to admit patient for further evaluation and management.   Hospital Course:  11/7-11/9:Patient has been admitted with acute diverticulitis and is undergoing treatment with IV Zosyn empirically with improvement noted thus far.  He has had no further bowel movements in the last 24 hours and is worried about developing constipation, but was assured that this is normal.  He continues to have ongoing moderate intensity lower abdominal pain and does not feel ready for discharge yet.  Stool C. difficile and GI panel canceled as bowel movement frequency has diminished.  Acute diverticulitis-slowly improving -CT abdomen pelvis demonstrating mild sigmoid colonic diverticulitis on admission -Pt was treated with IVZosyn.  DC home on oral augmentin x 10 days.  -Morphine and Zofran ordered for symptomatic treatment of pain and nausea in hospital -soft foods diet-well tolerated -C. difficile and GI panel canceled due to no further bowel movements  Hyperkalemia-resolved  Hyperglycemia-resolved -No prior history of type 2 diabetes -Hemoglobin A1c6.6% -He will need outpatient follow-up for initiation of medications once over acute illness  CKD stage IIIb -Baseline creatinine approximately 1.5-1.6 -Currently stable,continue to monitor with no further need for IV hydration -Avoid home Lasix for now and may resume on discharge  Essential hypertension -Currently stable -Continue home medications  CAD status post stent placement -Continue home prasugrel and Toprol -Continue home aspirin -Continue nitroglycerin sublingual as needed  Anxiety -Restart home medications  OSA not on CPAP -Stable  Morbid obesity -Lifestyle changes  DVT prophylaxis:Lovenox Code Status:Full code Family Communication:Patient  talking to  son Disposition Plan:  Home   Status is: Inpatient Discharge Diagnoses:  Active Problems:   Hyperkalemia   Abdominal pain   Essential hypertension   Diverticulitis   Nausea   Diarrhea   Leukocytosis   CKD (chronic kidney disease), stage III (HCC)   OSA (obstructive sleep apnea)   Dehydration   Hyperglycemia   Discharge Instructions:  Allergies as of 09/28/2020      Reactions   Tamsulosin Other (See Comments)   Light headed and pass out   Ciprofloxacin Itching   Ibuprofen Diarrhea, Nausea Only   Propoxyphene Itching   Darvocet   Sulfa Antibiotics Itching      Medication List    STOP taking these medications   metroNIDAZOLE 500 MG tablet Commonly known as: Flagyl   phenazopyridine 200 MG tablet Commonly known as: Pyridium   traMADol 50 MG tablet Commonly known as: Ultram     TAKE these medications   acetaminophen 500 MG tablet Commonly known as: TYLENOL Take 500-1,000 mg by mouth every 6 (six) hours as needed for moderate pain or headache.   amLODipine 5 MG tablet Commonly known as: NORVASC Take 5 mg by mouth daily.   amoxicillin-clavulanate 875-125 MG tablet Commonly known as: Augmentin Take 1 tablet by mouth 2 (two) times daily for 10 days.   aspirin EC 81 MG tablet Take 81 mg by mouth at bedtime.   buPROPion 300 MG 24 hr tablet Commonly known as: Wellbutrin XL Take 1 tablet (300 mg total) by mouth daily.   busPIRone 15 MG tablet Commonly known as: BUSPAR Take 2/3-1 tab po q am and 1 tab po QHS   clonazePAM 0.5 MG tablet Commonly known as: KLONOPIN Take 1 tablet (0.5 mg total) by mouth 2 (two) times daily as needed for anxiety.   CORICIDIN HBP PO Take 1 tablet by mouth daily as needed (severe allergies).   Fish Oil 1000 MG Caps Take 1,000 mg by mouth daily.   furosemide 20 MG tablet Commonly known as: LASIX Take 20 mg by mouth daily.   latanoprost 0.005 % ophthalmic solution Commonly known as: XALATAN Place 1 drop into both eyes at  bedtime.   losartan 50 MG tablet Commonly known as: COZAAR Take 100 mg by mouth daily.   LUBRICATING EYE DROPS OP Place 1 drop into both eyes daily as needed (dry eyes).   metoprolol succinate 25 MG 24 hr tablet Commonly known as: TOPROL-XL Take 12.5-25 mg by mouth See admin instructions. Take 25 mg in the morning and 12.5 mg in the evening   nitroGLYCERIN 0.4 MG SL tablet Commonly known as: NITROSTAT Place 1 tablet (0.4 mg total) under the tongue every 5 (five) minutes x 3 doses as needed for chest pain.   ondansetron 4 MG disintegrating tablet Commonly known as: ZOFRAN-ODT DISSOLVE 1 TABLET IN MOUTH EVERY 8 HOURS AS NEEDED FOR NAUSEA FOR VOMITING What changed: See the new instructions.   ondansetron 4 MG tablet Commonly known as: ZOFRAN Take 4 mg by mouth every 8 (eight) hours as needed for nausea or vomiting.   oxyCODONE 5 MG immediate release tablet Commonly known as: Oxy IR/ROXICODONE Take 1 tablet (5 mg total) by mouth every 6 (six) hours as needed for up to 3 days for severe pain.   prasugrel 10 MG Tabs tablet Commonly known as: EFFIENT Take 1 tablet (10 mg total) by mouth at bedtime. Resume 48 hours after surgery   sucralfate 1 g tablet Commonly known as: Carafate Take  1 tablet (1 g total) by mouth at bedtime.   Trulance 3 MG Tabs Generic drug: Plecanatide Take 1 tablet by mouth once daily       Follow-up Information    Moshe Cipro, MD. Schedule an appointment as soon as possible for a visit in 1 week(s).   Specialty: Internal Medicine Contact information: [address] [place] [address] [phone]              Allergies  Allergen Reactions  . Tamsulosin Other (See Comments)    Light headed and pass out  . Ciprofloxacin Itching  . Ibuprofen Diarrhea and Nausea Only  . Propoxyphene Itching    Darvocet  . Sulfa Antibiotics Itching   Allergies as of 09/28/2020      Reactions   Tamsulosin Other (See Comments)   Light headed  and pass out   Ciprofloxacin Itching   Ibuprofen Diarrhea, Nausea Only   Propoxyphene Itching   Darvocet   Sulfa Antibiotics Itching      Medication List    STOP taking these medications   metroNIDAZOLE 500 MG tablet Commonly known as: Flagyl   phenazopyridine 200 MG tablet Commonly known as: Pyridium   traMADol 50 MG tablet Commonly known as: Ultram     TAKE these medications   acetaminophen 500 MG tablet Commonly known as: TYLENOL Take 500-1,000 mg by mouth every 6 (six) hours as needed for moderate pain or headache.   amLODipine 5 MG tablet Commonly known as: NORVASC Take 5 mg by mouth daily.   amoxicillin-clavulanate 875-125 MG tablet Commonly known as: Augmentin Take 1 tablet by mouth 2 (two) times daily for 10 days.   aspirin EC 81 MG tablet Take 81 mg by mouth at bedtime.   buPROPion 300 MG 24 hr tablet Commonly known as: Wellbutrin XL Take 1 tablet (300 mg total) by mouth daily.   busPIRone 15 MG tablet Commonly known as: BUSPAR Take 2/3-1 tab po q am and 1 tab po QHS   clonazePAM 0.5 MG tablet Commonly known as: KLONOPIN Take 1 tablet (0.5 mg total) by mouth 2 (two) times daily as needed for anxiety.   CORICIDIN HBP PO Take 1 tablet by mouth daily as needed (severe allergies).   Fish Oil 1000 MG Caps Take 1,000 mg by mouth daily.   furosemide 20 MG tablet Commonly known as: LASIX Take 20 mg by mouth daily.   latanoprost 0.005 % ophthalmic solution Commonly known as: XALATAN Place 1 drop into both eyes at bedtime.   losartan 50 MG tablet Commonly known as: COZAAR Take 100 mg by mouth daily.   LUBRICATING EYE DROPS OP Place 1 drop into both eyes daily as needed (dry eyes).   metoprolol succinate 25 MG 24 hr tablet Commonly known as: TOPROL-XL Take 12.5-25 mg by mouth See admin instructions. Take 25 mg in the morning and 12.5 mg in the evening   nitroGLYCERIN 0.4 MG SL tablet Commonly known as: NITROSTAT Place 1 tablet (0.4 mg total)  under the tongue every 5 (five) minutes x 3 doses as needed for chest pain.   ondansetron 4 MG disintegrating tablet Commonly known as: ZOFRAN-ODT DISSOLVE 1 TABLET IN MOUTH EVERY 8 HOURS AS NEEDED FOR NAUSEA FOR VOMITING What changed: See the new instructions.   ondansetron 4 MG tablet Commonly known as: ZOFRAN Take 4 mg by mouth every 8 (eight) hours as needed for nausea or vomiting.   oxyCODONE 5 MG immediate release tablet Commonly known as: Oxy IR/ROXICODONE  Take 1 tablet (5 mg total) by mouth every 6 (six) hours as needed for up to 3 days for severe pain.   prasugrel 10 MG Tabs tablet Commonly known as: EFFIENT Take 1 tablet (10 mg total) by mouth at bedtime. Resume 48 hours after surgery   sucralfate 1 g tablet Commonly known as: Carafate Take 1 tablet (1 g total) by mouth at bedtime.   Trulance 3 MG Tabs Generic drug: Plecanatide Take 1 tablet by mouth once daily       Procedures/Studies: CT ABDOMEN PELVIS W CONTRAST  Result Date: 09/24/2020 CLINICAL DATA:  Lower abdominal pain EXAM: CT ABDOMEN AND PELVIS WITH CONTRAST TECHNIQUE: Multidetector CT imaging of the abdomen and pelvis was performed using the standard protocol following bolus administration of intravenous contrast. CONTRAST:  180mL OMNIPAQUE IOHEXOL 300 MG/ML  SOLN COMPARISON:  October 07, 2019 FINDINGS: Lower chest: The visualized heart size within normal limits. No pericardial fluid/thickening. No hiatal hernia. The visualized portions of the lungs are clear. Hepatobiliary: There is diffuse low density seen throughout the liver parenchyma.The main portal vein is patent. No evidence of calcified gallstones, gallbladder wall thickening or biliary dilatation. Pancreas: Unremarkable. No pancreatic ductal dilatation or surrounding inflammatory changes. Spleen: Normal in size without focal abnormality. Adrenals/Urinary Tract: Both adrenal glands appear normal. Calcifications are seen throughout the left kidney the  largest clustered within the lower pole measuring 8 mm. No hydronephrosis. Bladder is unremarkable. Stomach/Bowel: The stomach, small bowel are normal in appearance. There is question of mild wall thickening seen within the sigmoid colon with minimal surrounding fat stranding changes. Significant scattered colonic diverticula are noted. No pericolonic free fluid or loculated fluid collections. No inflammatory changes, wall thickening, or obstructive findings.The appendix is normal. Vascular/Lymphatic: There are no enlarged mesenteric, retroperitoneal, or pelvic lymph nodes. Scattered aortic atherosclerotic calcifications are seen without aneurysmal dilatation. Reproductive: The prostate is unremarkable. Other: No evidence of abdominal wall mass or hernia. Musculoskeletal: No acute or significant osseous findings. IMPRESSION: Findings which could be suggestive of mild sigmoid colonic diverticulitis. No pericolonic loculated fluid collections or free air. Nonobstructing left renal calculi. Hepatic steatosis Aortic Atherosclerosis (ICD10-I70.0). Electronically Signed   By: Prudencio Pair M.D.   On: 09/24/2020 22:01   DG Chest Port 1 View  Result Date: 09/25/2020 CLINICAL DATA:  Dyspnea on exertion, hypertension, tachycardia EXAM: PORTABLE CHEST 1 VIEW COMPARISON:  06/17/2015 FINDINGS: The heart size and mediastinal contours are within normal limits. Both lungs are clear. The visualized skeletal structures are unremarkable. IMPRESSION: No active disease. Electronically Signed   By: Randa Ngo M.D.   On: 09/25/2020 23:32      Subjective: Pt reports that he is feeling much better.  He is tolerating diet well.  No emesis.  Having bowel movements.   Discharge Exam: Vitals:   09/27/20 2116 09/28/20 0532  BP: (!) 163/84 (!) 122/57  Pulse: 68 64  Resp: 18 18  Temp: 97.8 F (36.6 C) 97.7 F (36.5 C)  SpO2: 92% 92%   Vitals:   09/27/20 0959 09/27/20 1352 09/27/20 2116 09/28/20 0532  BP: 133/71 128/71  (!) 163/84 (!) 122/57  Pulse:  69 68 64  Resp:  18 18 18   Temp:  97.9 F (36.6 C) 97.8 F (36.6 C) 97.7 F (36.5 C)  TempSrc:  Oral Oral Oral  SpO2:  93% 92% 92%  Weight:      Height:       General: Pt is alert, awake, not in acute distress Cardiovascular: normal S1/S2 +,  no rubs, no gallops Respiratory: CTA bilaterally, no wheezing, no rhonchi Abdominal: Soft, NT, ND, bowel sounds + very mild LLQ tenderness.  Extremities: no edema, no cyanosis   The results of significant diagnostics from this hospitalization (including imaging, microbiology, ancillary and laboratory) are listed below for reference.     Microbiology: Recent Results (from the past 240 hour(s))  Respiratory Panel by RT PCR (Flu A&B, Covid) - Nasopharyngeal Swab     Status: None   Collection Time: 09/25/20 12:00 AM   Specimen: Nasopharyngeal Swab  Result Value Ref Range Status   SARS Coronavirus 2 by RT PCR NEGATIVE NEGATIVE Final    Comment: (NOTE) SARS-CoV-2 target nucleic acids are NOT DETECTED.  The SARS-CoV-2 RNA is generally detectable in upper respiratoy specimens during the acute phase of infection. The lowest concentration of SARS-CoV-2 viral copies this assay can detect is 131 copies/mL. A negative result does not preclude SARS-Cov-2 infection and should not be used as the sole basis for treatment or other patient management decisions. A negative result may occur with  improper specimen collection/handling, submission of specimen other than nasopharyngeal swab, presence of viral mutation(s) within the areas targeted by this assay, and inadequate number of viral copies (<131 copies/mL). A negative result must be combined with clinical observations, patient history, and epidemiological information. The expected result is Negative.  Fact Sheet for Patients:  PinkCheek.be  Fact Sheet for Healthcare Providers:  GravelBags.it  This test is no  t yet approved or cleared by the [place] FDA and  has been authorized for detection and/or diagnosis of SARS-CoV-2 by FDA under an Emergency Use Authorization (EUA). This EUA will remain  in effect (meaning this test can be used) for the duration of the COVID-19 declaration under Section 564(b)(1) of the Act, 21 U.S.C. section 360bbb-3(b)(1), unless the authorization is terminated or revoked sooner.     Influenza A by PCR NEGATIVE NEGATIVE Final   Influenza B by PCR NEGATIVE NEGATIVE Final    Comment: (NOTE) The Xpert Xpress SARS-CoV-2/FLU/RSV assay is intended as an aid in  the diagnosis of influenza from Nasopharyngeal swab specimens and  should not be used as a sole basis for treatment. Nasal washings and  aspirates are unacceptable for Xpert Xpress SARS-CoV-2/FLU/RSV  testing.  Fact Sheet for Patients: PinkCheek.be  Fact Sheet for Healthcare Providers: GravelBags.it  This test is not yet approved or cleared by the [place] FDA and  has been authorized for detection and/or diagnosis of SARS-CoV-2 by  FDA under an Emergency Use Authorization (EUA). This EUA will remain  in effect (meaning this test can be used) for the duration of the  Covid-19 declaration under Section 564(b)(1) of the Act, 21  U.S.C. section 360bbb-3(b)(1), unless the authorization is  terminated or revoked. Performed at [facility], [address] [place], [place] [address]   Culture, blood (Routine X 2) w Reflex to ID Panel     Status: None (Preliminary result)   Collection Time: 09/25/20  3:24 AM   Specimen: Right Antecubital; Blood  Result Value Ref Range Status   Specimen Description RIGHT ANTECUBITAL  Final   Special Requests   Final    BOTTLES DRAWN AEROBIC AND ANAEROBIC Blood Culture adequate volume   Culture   Final    NO GROWTH 2 DAYS Performed at [facility], [address] [place], [place] [address]    Report Status  PENDING  Incomplete  Culture, blood (Routine X 2) w Reflex to ID Panel  Status: None (Preliminary result)   Collection Time: 09/25/20  3:35 AM   Specimen: BLOOD RIGHT HAND  Result Value Ref Range Status   Specimen Description BLOOD RIGHT HAND  Final   Special Requests   Final    BOTTLES DRAWN AEROBIC AND ANAEROBIC Blood Culture adequate volume   Culture   Final    NO GROWTH 2 DAYS Performed at [facility], [address], [place], [place] [address]    Report Status PENDING  Incomplete     Labs: BNP (last 3 results) No results for input(s): BNP in the last 8760 hours. Basic Metabolic Panel: Recent Labs  Lab 09/24/20 1953 09/25/20 0325 09/26/20 0713 09/27/20 0615 09/28/20 0456  NA 134* 135 136 136 136  K 5.5* 4.9 4.1 4.1 3.8  CL 104 104 102 101 100  CO2 21* 23 26 26 27   GLUCOSE 173* 130* 132* 112* 115*  BUN 37* 32* 20 18 15   CREATININE 1.60* 1.66* 1.52* 1.55* 1.32*  CALCIUM 9.4 8.8* 8.2* 8.2* 8.6*  MG  --  2.0  --   --   --   PHOS  --  2.7  --   --   --    Liver Function Tests: Recent Labs  Lab 09/24/20 1953 09/25/20 0325 09/26/20 0713  AST 20 15 16   ALT 29 24 24   ALKPHOS 45 40 36*  BILITOT 0.7 0.8 0.7  PROT 7.8 7.3 6.4*  ALBUMIN 4.2 3.9 3.5   Recent Labs  Lab 09/24/20 1953  LIPASE 27   No results for input(s): AMMONIA in the last 168 hours. CBC: Recent Labs  Lab 09/24/20 1953 09/25/20 0325 09/26/20 0713 09/27/20 0615 09/28/20 0456  WBC 11.6* 10.7* 9.5 9.8 11.0*  HGB 16.7 15.4 13.8 13.3 14.0  HCT 54.4* 50.7 46.6 44.2 46.2  MCV 96.8 98.4 98.3 98.7 98.5  PLT 264 272 212 211 240   Cardiac Enzymes: No results for input(s): CKTOTAL, CKMB, CKMBINDEX, TROPONINI in the last 168 hours. BNP: Invalid input(s): POCBNP CBG: Recent Labs  Lab 09/26/20 0447  GLUCAP 125*   D-Dimer No results for input(s): DDIMER in the last 72 hours. Hgb A1c No results for input(s): HGBA1C in the last 72 hours. Lipid Profile No results for input(s): CHOL, HDL,  LDLCALC, TRIG, CHOLHDL, LDLDIRECT in the last 72 hours. Thyroid function studies No results for input(s): TSH, T4TOTAL, T3FREE, THYROIDAB in the last 72 hours.  Invalid input(s): FREET3 Anemia work up No results for input(s): VITAMINB12, FOLATE, FERRITIN, TIBC, IRON, RETICCTPCT in the last 72 hours. Urinalysis    Component Value Date/Time   COLORURINE AMBER (A) 06/10/2015 1337   APPEARANCEUR HAZY (A) 06/10/2015 1337   LABSPEC >1.030 (H) 06/10/2015 1337   PHURINE 5.5 06/10/2015 1337   GLUCOSEU NEGATIVE 06/10/2015 1337   HGBUR NEGATIVE 06/10/2015 1337   BILIRUBINUR negative 11/16/2015 1436   KETONESUR 15 (A) 06/10/2015 1337   PROTEINUR negative 11/16/2015 1436   PROTEINUR 100 (A) 06/10/2015 1337   UROBILINOGEN negative 11/16/2015 1436   UROBILINOGEN 1.0 06/10/2015 1337   NITRITE negative 11/16/2015 1436   NITRITE NEGATIVE 06/10/2015 1337   LEUKOCYTESUR Negative 11/16/2015 1436   Sepsis Labs Invalid input(s): PROCALCITONIN,  WBC,  LACTICIDVEN Microbiology Recent Results (from the past 240 hour(s))  Respiratory Panel by RT PCR (Flu A&B, Covid) - Nasopharyngeal Swab     Status: None   Collection Time: 09/25/20 12:00 AM   Specimen: Nasopharyngeal Swab  Result Value Ref Range Status   SARS Coronavirus 2 by RT  PCR NEGATIVE NEGATIVE Final    Comment: (NOTE) SARS-CoV-2 target nucleic acids are NOT DETECTED.  The SARS-CoV-2 RNA is generally detectable in upper respiratoy specimens during the acute phase of infection. The lowest concentration of SARS-CoV-2 viral copies this assay can detect is 131 copies/mL. A negative result does not preclude SARS-Cov-2 infection and should not be used as the sole basis for treatment or other patient management decisions. A negative result may occur with  improper specimen collection/handling, submission of specimen other than nasopharyngeal swab, presence of viral mutation(s) within the areas targeted by this assay, and inadequate number of viral  copies (<131 copies/mL). A negative result must be combined with clinical observations, patient history, and epidemiological information. The expected result is Negative.  Fact Sheet for Patients:  PinkCheek.be  Fact Sheet for Healthcare Providers:  GravelBags.it  This test is no t yet approved or cleared by the [place] FDA and  has been authorized for detection and/or diagnosis of SARS-CoV-2 by FDA under an Emergency Use Authorization (EUA). This EUA will remain  in effect (meaning this test can be used) for the duration of the COVID-19 declaration under Section 564(b)(1) of the Act, 21 U.S.C. section 360bbb-3(b)(1), unless the authorization is terminated or revoked sooner.     Influenza A by PCR NEGATIVE NEGATIVE Final   Influenza B by PCR NEGATIVE NEGATIVE Final    Comment: (NOTE) The Xpert Xpress SARS-CoV-2/FLU/RSV assay is intended as an aid in  the diagnosis of influenza from Nasopharyngeal swab specimens and  should not be used as a sole basis for treatment. Nasal washings and  aspirates are unacceptable for Xpert Xpress SARS-CoV-2/FLU/RSV  testing.  Fact Sheet for Patients: PinkCheek.be  Fact Sheet for Healthcare Providers: GravelBags.it  This test is not yet approved or cleared by the [place] FDA and  has been authorized for detection and/or diagnosis of SARS-CoV-2 by  FDA under an Emergency Use Authorization (EUA). This EUA will remain  in effect (meaning this test can be used) for the duration of the  Covid-19 declaration under Section 564(b)(1) of the Act, 21  U.S.C. section 360bbb-3(b)(1), unless the authorization is  terminated or revoked. Performed at [facility], [address], [place], [place] [address]   Culture, blood (Routine X 2) w Reflex to ID Panel     Status: None (Preliminary result)   Collection Time: 09/25/20  3:24 AM    Specimen: Right Antecubital; Blood  Result Value Ref Range Status   Specimen Description RIGHT ANTECUBITAL  Final   Special Requests   Final    BOTTLES DRAWN AEROBIC AND ANAEROBIC Blood Culture adequate volume   Culture   Final    NO GROWTH 2 DAYS Performed at [facility], [address] [place], [place] [address]    Report Status PENDING  Incomplete  Culture, blood (Routine X 2) w Reflex to ID Panel     Status: None (Preliminary result)   Collection Time: 09/25/20  3:35 AM   Specimen: BLOOD RIGHT HAND  Result Value Ref Range Status   Specimen Description BLOOD RIGHT HAND  Final   Special Requests   Final    BOTTLES DRAWN AEROBIC AND ANAEROBIC Blood Culture adequate volume   Culture   Final    NO GROWTH 2 DAYS Performed at [facility], [address], [place],  [address]    Report Status PENDING  Incomplete   Time coordinating discharge: 38 minutes   SIGNED:  Irwin Brakeman, MD  [facility] 09/28/2020, 10:45  AM How to contact the [facility] Attending or Consulting provider [place] or covering provider during after hours [place], for this patient?  1. Check the care team in [facility] and look for a) attending/consulting [facility] provider listed and b) the [facility] team listed 2. Log into [email or web address] and use [place]'s universal password to access. If you do not have the password, please contact the hospital operator. 3. Locate the [facility] provider you are looking for under [facility] and page to a number that you can be directly reached. 4. If you still have difficulty reaching the provider, please page the [facility] (Director on Call) for the Hospitalists listed on amion for assistance.

## 2020-09-28 NOTE — Plan of Care (Signed)

## 2020-10-01 LAB — CULTURE, BLOOD (ROUTINE X 2)
Culture: NO GROWTH
Culture: NO GROWTH
Special Requests: ADEQUATE
Special Requests: ADEQUATE

## 2020-10-04 ENCOUNTER — Other Ambulatory Visit: Payer: Self-pay | Admitting: *Deleted

## 2020-10-04 NOTE — Patient Outreach (Signed)
[facility]) Care Management  10/04/2020  Sergio Griffin [birth year] [id number]   EMMI-GENERAL DISCHARGE-RESOLVED RED ON EMMI ALERT Day # 4 Date: 10/03/2020 Red Alert Reason: QUESTIONS/PROBLEMS, SAD/HOPELESS/ANIXOUS/EMPTY  OUTREACH #1 RN spoke with pt today concerning the above emmi. Pt reports history with GI issues related to his diverticulitis. RN nquired on any immediate needs or issues as mentioned above. Pt states he has rescheduled his appointment with this provider for 11/29 because he was having issues with his bowels this morning and his appointment was for today. Pt denies any suicidal thoughts and states he has a psychiatrist with a pending tele-health appointment. Pt also reports he is taking medication for this medical problem. Pt has all medications along with sufficient transportation to get to all his pending medical appointments. No further needs at this time.  Will close this case with no additional needs. Pt aware to contact his provider with any additional or if he felt he needs an earlier appointment.  Raina Mina, RN Care Management Coordinator [place] Office [phone]

## 2020-10-17 DIAGNOSIS — K5904 Chronic idiopathic constipation: Secondary | ICD-10-CM | POA: Diagnosis not present

## 2020-10-17 DIAGNOSIS — N183 Chronic kidney disease, stage 3 unspecified: Secondary | ICD-10-CM | POA: Diagnosis not present

## 2020-10-17 DIAGNOSIS — Z09 Encounter for follow-up examination after completed treatment for conditions other than malignant neoplasm: Secondary | ICD-10-CM | POA: Diagnosis not present

## 2020-10-17 DIAGNOSIS — I2510 Atherosclerotic heart disease of native coronary artery without angina pectoris: Secondary | ICD-10-CM | POA: Diagnosis not present

## 2020-10-17 DIAGNOSIS — K5792 Diverticulitis of intestine, part unspecified, without perforation or abscess without bleeding: Secondary | ICD-10-CM | POA: Diagnosis not present

## 2020-10-18 DIAGNOSIS — R600 Localized edema: Secondary | ICD-10-CM | POA: Diagnosis not present

## 2020-10-18 DIAGNOSIS — N200 Calculus of kidney: Secondary | ICD-10-CM | POA: Diagnosis not present

## 2020-10-18 DIAGNOSIS — I129 Hypertensive chronic kidney disease with stage 1 through stage 4 chronic kidney disease, or unspecified chronic kidney disease: Secondary | ICD-10-CM | POA: Diagnosis not present

## 2020-10-18 DIAGNOSIS — I2510 Atherosclerotic heart disease of native coronary artery without angina pectoris: Secondary | ICD-10-CM | POA: Diagnosis not present

## 2020-10-18 DIAGNOSIS — N1832 Chronic kidney disease, stage 3b: Secondary | ICD-10-CM | POA: Diagnosis not present

## 2020-10-19 ENCOUNTER — Telehealth: Payer: Self-pay | Admitting: *Deleted

## 2020-10-19 ENCOUNTER — Ambulatory Visit (INDEPENDENT_AMBULATORY_CARE_PROVIDER_SITE_OTHER): Payer: Medicare Other | Admitting: Gastroenterology

## 2020-10-19 ENCOUNTER — Encounter: Payer: Self-pay | Admitting: Gastroenterology

## 2020-10-19 ENCOUNTER — Other Ambulatory Visit: Payer: Self-pay | Admitting: Psychiatry

## 2020-10-19 VITALS — BP 130/80 | HR 74 | Ht 71.0 in | Wt 339.2 lb

## 2020-10-19 DIAGNOSIS — R1319 Other dysphagia: Secondary | ICD-10-CM | POA: Diagnosis not present

## 2020-10-19 DIAGNOSIS — Z23 Encounter for immunization: Secondary | ICD-10-CM | POA: Diagnosis not present

## 2020-10-19 DIAGNOSIS — R1032 Left lower quadrant pain: Secondary | ICD-10-CM

## 2020-10-19 DIAGNOSIS — K5909 Other constipation: Secondary | ICD-10-CM | POA: Diagnosis not present

## 2020-10-19 DIAGNOSIS — Z8719 Personal history of other diseases of the digestive system: Secondary | ICD-10-CM | POA: Diagnosis not present

## 2020-10-19 DIAGNOSIS — F331 Major depressive disorder, recurrent, moderate: Secondary | ICD-10-CM

## 2020-10-19 MED ORDER — LUBIPROSTONE 24 MCG PO CAPS: 24.0000 ug | ORAL_CAPSULE | Freq: Two times a day (BID) | ORAL | 3 refills | Status: DC

## 2020-10-19 MED ORDER — SUPREP BOWEL PREP KIT 17.5-3.13-1.6 GM/177ML PO SOLN: 1.0000 | Freq: Once | ORAL | 0 refills | Status: AC

## 2020-10-19 NOTE — Progress Notes (Signed)
Sergio Griffin    [id number]    07-14-1951  Primary Care Physician:Caplan, Legrand Como, MD  Referring Physician: Moshe Cipro, MD [address]  [place] [address]   Chief complaint: Left lower quadrant abdominal pain, constipation, dysphagia  HPI:  69 year old very pleasant gentleman with history of morbid obesity, CAD s/p PCI, obstructive sleep apnea here for follow-up visit after recent hospitalization with acute diverticulitis  He was treated with oral antibiotics by his PMD in October, was hospitalized last month with acute left lower quadrant abdominal pain, was treated with IV antibiotics for 4 days and completed 10-day course of oral antibiotics.  Abdominal pain has improved significantly but he continues to have mild discomfort.  He is bowel habits are returning back to baseline, continues to have intermittent constipation, is taking MiraLAX daily.  He is struggling to titrate the dose  Denies any rectal bleeding or melena.  He continues to have intermittent dysphagia   He had a heart attack in March 2020 while he was still on Brilinta, had a in-stent restenosis, underwent repeat PCI with stent placement and is currently on Prasugrel   Colonoscopy 2012 by Dr. Algis Greenhouse normal with no polyps. He was recommended to repeat colonoscopy in 5 years due to family history of colon polyps  CT abdomen pelvis September 08, 2018 showed severe diverticulosis involving left side of the colon with no acute diverticulitis, small nonobstructing left renal calculi and mesenteric panniculitis   Outpatient Encounter Medications as of 10/19/2020  Medication Sig  . acetaminophen (TYLENOL) 500 MG tablet Take 500-1,000 mg by mouth every 6 (six) hours as needed for moderate pain or headache.  Marland Kitchen amLODipine (NORVASC) 5 MG tablet Take 5 mg by mouth daily. Takes two tablets daily  . aspirin EC 81 MG tablet Take 81 mg by mouth at bedtime.   Marland Kitchen buPROPion (WELLBUTRIN XL) 300 MG  24 hr tablet Take 1 tablet (300 mg total) by mouth daily.  . busPIRone (BUSPAR) 15 MG tablet Take 2/3-1 tab po q am and 1 tab po QHS  . Carboxymethylcellul-Glycerin (LUBRICATING EYE DROPS OP) Place 1 drop into both eyes daily as needed (dry eyes).  . clonazePAM (KLONOPIN) 0.5 MG tablet Take 1 tablet (0.5 mg total) by mouth 2 (two) times daily as needed for anxiety.  Marland Kitchen DM-APAP-CPM (CORICIDIN HBP PO) Take 1 tablet by mouth daily as needed (severe allergies).  . furosemide (LASIX) 20 MG tablet Take 20 mg by mouth daily.   Marland Kitchen latanoprost (XALATAN) 0.005 % ophthalmic solution Place 1 drop into both eyes at bedtime.  Marland Kitchen losartan (COZAAR) 50 MG tablet Take 100 mg by mouth daily.   . metoprolol succinate (TOPROL-XL) 25 MG 24 hr tablet Take 12.5-25 mg by mouth See admin instructions. Take 25 mg in the morning and 12.5 mg in the evening  . nitroGLYCERIN (NITROSTAT) 0.4 MG SL tablet Place 1 tablet (0.4 mg total) under the tongue every 5 (five) minutes x 3 doses as needed for chest pain.  . Omega-3 Fatty Acids (FISH OIL) 1000 MG CAPS Take 1,000 mg by mouth daily.  . ondansetron (ZOFRAN) 4 MG tablet Take 4 mg by mouth every 8 (eight) hours as needed for nausea or vomiting.  . ondansetron (ZOFRAN-ODT) 4 MG disintegrating tablet DISSOLVE 1 TABLET IN MOUTH EVERY 8 HOURS AS NEEDED FOR NAUSEA FOR VOMITING (Patient taking differently: Take 4 mg by mouth every 8 (eight) hours as needed. )  . prasugrel (  EFFIENT) 10 MG TABS tablet Take 1 tablet (10 mg total) by mouth at bedtime. Resume 48 hours after surgery  . sucralfate (CARAFATE) 1 g tablet Take 1 tablet (1 g total) by mouth at bedtime.  . [DISCONTINUED] TRULANCE 3 MG TABS Take 1 tablet by mouth once daily (Patient not taking: Reported on 10/19/2020)   No facility-administered encounter medications on file as of 10/19/2020.    Allergies as of 10/19/2020 - Review Complete 10/19/2020  Allergen Reaction Noted  . Tamsulosin Other (See Comments) 09/24/2020  .  Ciprofloxacin Itching 09/24/2020  . Ibuprofen Diarrhea and Nausea Only 10/20/2019  . Propoxyphene Itching 10/20/2019  . Sulfa antibiotics Itching 06/10/2015    Past Medical History:  Diagnosis Date  . Anxiety   . Barrett's esophagus   . CAD (coronary artery disease)    PCI, stent 2011  . Cardiac arrhythmia   . Chicken pox   . Depression   . Diverticulitis   . Diverticulosis   . Gastric ulcer   . GERD (gastroesophageal reflux disease)   . Glaucoma   . Heart attack ([place]) 01/29/2019  . History of myocardial infarction   . Hyperlipidemia   . Hypertension   . Kidney stones   . Kidney stones   . Pneumonia 11/2014  . Prostatitis   . Reflux   . Sleep apnea   . UTI (lower urinary tract infection)     Past Surgical History:  Procedure Laterality Date  . cardiac stents     last one 01/29/2019  . CYSTOSCOPY/URETEROSCOPY/HOLMIUM LASER/STENT PLACEMENT Left 10/22/2019   Procedure: CYSTOSCOPY/URETEROSCOPY/HOLMIUM LASER/STENT PLACEMENT;  Surgeon: Ardis Hughs, MD;  Location: [facility] ORS;  Service: Urology;  Laterality: Left;  . ESOPHAGEAL MANOMETRY N/A 09/11/2017   Procedure: ESOPHAGEAL MANOMETRY (EM);  Surgeon: Mauri Pole, MD;  Location: [facility] ENDOSCOPY;  Service: Endoscopy;  Laterality: N/A;  . EYE SURGERY Left    x 4  . EYE SURGERY Right    x 3    Family History  Problem Relation Age of Onset  . Arthritis Mother   . Hyperlipidemia Mother   . Stroke Mother   . Hypertension Mother   . Ovarian cancer Mother   . Hyperlipidemia Father   . Heart disease Father   . Hypertension Father   . Non-Hodgkin's lymphoma Father   . Hyperlipidemia Maternal Grandmother   . Heart disease Maternal Grandmother   . Stroke Maternal Grandmother   . Hypertension Maternal Grandmother   . Diabetes Maternal Grandmother   . Hyperlipidemia Maternal Grandfather   . Heart disease Maternal Grandfather   . Hypertension Maternal Grandfather   . Heart disease Paternal Grandmother   . Heart  disease Paternal Grandfather   . Stroke Paternal Grandfather   . Hypertension Paternal Grandfather   . Congestive Heart Failure Brother   . Colon cancer Neg Hx   . Esophageal cancer Neg Hx   . Pancreatic cancer Neg Hx   . Stomach cancer Neg Hx   . Liver disease Neg Hx     Social History   Socioeconomic History  . Marital status: Divorced    Spouse name: Not on file  . Number of children: 2  . Years of education: 48  . Highest education level: Not on file  Occupational History  . Occupation: Estate manager/land agent    Comment: Retired  Tobacco Use  . Smoking status: Former Smoker    Types: Cigarettes  . Smokeless tobacco: Never Used  . Tobacco comment: quit 1987  Vaping Use  .  Vaping Use: Never used  Substance and Sexual Activity  . Alcohol use: No  . Drug use: No  . Sexual activity: Not Currently    Partners: Female  Other Topics Concern  . Not on file  Social History Narrative   Fun: Paint, cook, travel   Denies religious beliefs effecting health care.    Social Determinants of Health   Financial Resource Strain:   . Difficulty of Paying Living Expenses: Not on file  Food Insecurity:   . Worried About Charity fundraiser in the Last Year: Not on file  . Ran Out of Food in the Last Year: Not on file  Transportation Needs:   . Lack of Transportation (Medical): Not on file  . Lack of Transportation (Non-Medical): Not on file  Physical Activity:   . Days of Exercise per Week: Not on file  . Minutes of Exercise per Session: Not on file  Stress:   . Feeling of Stress : Not on file  Social Connections:   . Frequency of Communication with Friends and Family: Not on file  . Frequency of Social Gatherings with Friends and Family: Not on file  . Attends Religious Services: Not on file  . Active Member of Clubs or Organizations: Not on file  . Attends Archivist Meetings: Not on file  . Marital Status: Not on file  Intimate Partner Violence:   . Fear of  Current or Ex-Partner: Not on file  . Emotionally Abused: Not on file  . Physically Abused: Not on file  . Sexually Abused: Not on file      Review of systems: All other review of systems negative except as mentioned in the HPI.   Physical Exam: Vitals:   10/19/20 0821  BP: 130/80  Pulse: 74  SpO2: 98%   Body mass index is 47.31 kg/m. Gen:      No acute distress HEENT:  sclera anicteric Abd:      soft, non-tender; no palpable masses, no distension Ext:    b/l edema ++ Neuro: alert and oriented x 3 Psych: normal mood and affect  Data Reviewed:  Reviewed labs, radiology imaging, old records and pertinent past GI work up   Assessment and Plan/Recommendations: 69 year old very pleasant gentleman with history of morbid obesity, CAD s/p PCI on antiplatelet therapy, recently hospitalized with acute diverticulitis  Acute sigmoid diverticulitis: S/p IV and oral antibiotics He is past due for colorectal cancer screening We will schedule for colonoscopy to exclude any neoplastic lesion  We will request cardiology clearance prior to the procedure.  He is short of breath with minimal activity. Will need clearance from cardiology if okay to hold Prasugrel prior to the procedure  Chronic idiopathic constipation: Start Amitiza 24 mcg twice daily  Dysphagia: Schedule for EGD along with colonoscopy, exclude erosive esophagitis or peptic stricture.  Plan for possible esophageal biopsies or dilation as needed  The risks and benefits as well as alternatives of endoscopic procedure(s) have been discussed and reviewed. All questions answered. The patient agrees to proceed.   This visit required >40 minutes of patient care (this includes precharting, chart review, review of results, face-to-face time used for counseling as well as treatment plan and follow-up. The patient was provided an opportunity to ask questions and all were answered. The patient agreed with the plan and demonstrated an  understanding of the instructions.  Damaris Hippo , MD    CC: Moshe Cipro, MD

## 2020-10-19 NOTE — Patient Instructions (Signed)
You have been scheduled for a colonoscopy. Please follow written instructions given to you at your visit today.  Please pick up your prep supplies at the pharmacy within the next 1-3 days. If you use inhalers (even only as needed), please bring them with you on the day of your procedure.   If you are age 70 or older, your body mass index should be between 23-30. Your Body mass index is 47.31 kg/m. If this is out of the aforementioned range listed, please consider follow up with your Primary Care Provider.  If you are age 60 or younger, your body mass index should be between 19-25. Your Body mass index is 47.31 kg/m. If this is out of the aformentioned range listed, please consider follow up with your Primary Care Provider.    You will be contacted by our office prior to your procedure for directions on holding your Effient.  If you do not hear from our office 1 week prior to your scheduled procedure, please call [phone] to discuss.   STOP Miralax with Amitiza  Or just do Mirlalax 1-2 capfuls daily  Follow up in 6 months  I appreciate the  opportunity to care for you  Thank You   Harl Bowie , MD

## 2020-10-19 NOTE — Telephone Encounter (Signed)
Leet faxed to [facility] Donato Heinz

## 2020-10-20 ENCOUNTER — Other Ambulatory Visit: Payer: Self-pay | Admitting: Psychiatry

## 2020-10-20 ENCOUNTER — Encounter: Payer: Self-pay | Admitting: Gastroenterology

## 2020-10-20 DIAGNOSIS — F410 Panic disorder [episodic paroxysmal anxiety] without agoraphobia: Secondary | ICD-10-CM

## 2020-10-20 NOTE — Telephone Encounter (Signed)
Stop for 5 days per Patient said Dr Sabra Heck told him, I explained to him that we have to have a fax from the doctor and he is calling them to make sure its done today

## 2020-10-20 NOTE — Telephone Encounter (Signed)
Last apt 08/24/20

## 2020-10-20 NOTE — Telephone Encounter (Signed)
Received fax back from Dr Sanjuan Dame office ... Ok to hold Effient 5 days prior to procedure which is scheduled for 12/8   Pt is aware    Will keep letter at my desk until patient has procedure before being scanned in

## 2020-10-24 ENCOUNTER — Telehealth: Payer: Self-pay | Admitting: *Deleted

## 2020-10-24 NOTE — Telephone Encounter (Signed)
===  View-only below this line=== ----- Message ----- From: Martinique, Patti E, CMA Sent: 10/21/2020   5:19 PM EST To: Oda Kilts, CMA   ----- Message ----- From: Mauri Pole, MD Sent: 10/21/2020   4:20 PM EST To: Patti E Martinique, [place], he is fine to proceed as scheduled. Thanks ----- Message ----- From: [place]ique, Patti E, CMA Sent: 10/21/2020   2:53 PM EST To: Mauri Pole, MD  I got the ECHO report and office notes you requested. Put them in your chair.  Stephanie scanned in

## 2020-10-26 ENCOUNTER — Ambulatory Visit (AMBULATORY_SURGERY_CENTER): Payer: Medicare Other | Admitting: Gastroenterology

## 2020-10-26 ENCOUNTER — Encounter: Payer: Self-pay | Admitting: Gastroenterology

## 2020-10-26 ENCOUNTER — Other Ambulatory Visit: Payer: Self-pay

## 2020-10-26 VITALS — BP 128/60 | HR 69 | Temp 98.0°F | Resp 14 | Ht 71.0 in | Wt 339.0 lb

## 2020-10-26 DIAGNOSIS — K5732 Diverticulitis of large intestine without perforation or abscess without bleeding: Secondary | ICD-10-CM

## 2020-10-26 DIAGNOSIS — K2950 Unspecified chronic gastritis without bleeding: Secondary | ICD-10-CM | POA: Diagnosis not present

## 2020-10-26 DIAGNOSIS — K2990 Gastroduodenitis, unspecified, without bleeding: Secondary | ICD-10-CM

## 2020-10-26 DIAGNOSIS — K5790 Diverticulosis of intestine, part unspecified, without perforation or abscess without bleeding: Secondary | ICD-10-CM

## 2020-10-26 DIAGNOSIS — R1310 Dysphagia, unspecified: Secondary | ICD-10-CM | POA: Diagnosis not present

## 2020-10-26 DIAGNOSIS — K319 Disease of stomach and duodenum, unspecified: Secondary | ICD-10-CM | POA: Diagnosis not present

## 2020-10-26 DIAGNOSIS — D122 Benign neoplasm of ascending colon: Secondary | ICD-10-CM

## 2020-10-26 DIAGNOSIS — K621 Rectal polyp: Secondary | ICD-10-CM | POA: Diagnosis not present

## 2020-10-26 DIAGNOSIS — Z860100 Personal history of colon polyps, unspecified: Secondary | ICD-10-CM

## 2020-10-26 DIAGNOSIS — K2970 Gastritis, unspecified, without bleeding: Secondary | ICD-10-CM | POA: Diagnosis not present

## 2020-10-26 DIAGNOSIS — D123 Benign neoplasm of transverse colon: Secondary | ICD-10-CM

## 2020-10-26 DIAGNOSIS — Z1211 Encounter for screening for malignant neoplasm of colon: Secondary | ICD-10-CM | POA: Diagnosis not present

## 2020-10-26 DIAGNOSIS — D128 Benign neoplasm of rectum: Secondary | ICD-10-CM

## 2020-10-26 DIAGNOSIS — K5730 Diverticulosis of large intestine without perforation or abscess without bleeding: Secondary | ICD-10-CM | POA: Diagnosis not present

## 2020-10-26 MED ORDER — SODIUM CHLORIDE 0.9 % IV SOLN: 500.0000 mL | Freq: Once | INTRAVENOUS | Status: DC

## 2020-10-26 NOTE — Progress Notes (Signed)
Pt's states no medical or surgical changes since previsit or office visit. 

## 2020-10-26 NOTE — Op Note (Signed)
[facility] Patient Name: Sergio Griffin Procedure Date: 10/26/2020 1:53 PM MRN: [id number] Endoscopist: Mauri Pole , MD Age: 69 Referring MD:  Date of Birth: 06/11/1951 Gender: Male Account #: [id number] Procedure:                Upper GI endoscopy Indications:              Dysphagia Medicines:                Monitored Anesthesia Care Procedure:                Pre-Anesthesia Assessment:                           - Prior to the procedure, a History and Physical                            was performed, and patient medications and                            allergies were reviewed. The patient's tolerance of                            previous anesthesia was also reviewed. The risks                            and benefits of the procedure and the sedation                            options and risks were discussed with the patient.                            All questions were answered, and informed consent                            was obtained. Prior Anticoagulants: The patient has                            taken no previous anticoagulant or antiplatelet                            agents. ASA Grade Assessment: III - A patient with                            severe systemic disease. After reviewing the risks                            and benefits, the patient was deemed in                            satisfactory condition to undergo the procedure.                           After obtaining informed consent, the endoscope was  passed under direct vision. Throughout the                            procedure, the patient's blood pressure, pulse, and                            oxygen saturations were monitored continuously. The                            Endoscope was introduced through the mouth, and                            advanced to the second part of duodenum. The upper                            GI endoscopy was accomplished without  difficulty.                            The patient tolerated the procedure well. Scope In: Scope Out: Findings:                 No endoscopic abnormality was evident in the                            esophagus to explain the patient's complaint of                            dysphagia. It was decided, however, to proceed with                            dilation of the entire esophagus. The scope was                            withdrawn. Dilation was performed with a Maloney                            dilator with no resistance at 54 Fr. The dilation                            site was examined following endoscope reinsertion                            and showed no change.                           The Z-line was regular and was found 40 cm from the                            incisors.                           Patchy mild inflammation characterized by                            congestion (  edema) and erythema was found in the                            entire examined stomach. Biopsies were taken with a                            cold forceps for Helicobacter pylori testing.                           The examined duodenum was normal. Complications:            No immediate complications. Estimated Blood Loss:     Estimated blood loss was minimal. Impression:               - No endoscopic esophageal abnormality to explain                            patient's dysphagia. Esophagus dilated. Dilated.                           - Z-line regular, 40 cm from the incisors.                           - Gastritis. Biopsied.                           - Normal examined duodenum. Recommendation:           - Patient has a contact number available for                            emergencies. The signs and symptoms of potential                            delayed complications were discussed with the                            patient. Return to normal activities tomorrow.                            Written  discharge instructions were provided to the                            patient.                           - Resume previous diet.                           - Continue present medications.                           - Await pathology results.                           - See the other procedure note for documentation of  additional recommendations. Mauri Pole, MD 10/26/2020 3:19:33 PM This report has been signed electronically.

## 2020-10-26 NOTE — Op Note (Signed)
[place] Patient Name: Sergio Griffin Procedure Date: 10/26/2020 1:52 PM MRN: [id number] Endoscopist: Mauri Pole , MD Age: 69 Referring MD:  Date of Birth: 30-Sep-1951 Gender: Male Account #: [id number] Procedure:                Colonoscopy Indications:              High risk colon cancer surveillance: Personal                            history of colonic polyps, High risk colon cancer                            surveillance: Personal history of adenoma (10 mm or                            greater in size), High risk colon cancer                            surveillance: Personal history of multiple (3 or                            more) adenomas Medicines:                Monitored Anesthesia Care Procedure:                Pre-Anesthesia Assessment:                           - Prior to the procedure, a History and Physical                            was performed, and patient medications and                            allergies were reviewed. The patient's tolerance of                            previous anesthesia was also reviewed. The risks                            and benefits of the procedure and the sedation                            options and risks were discussed with the patient.                            All questions were answered, and informed consent                            was obtained. Prior Anticoagulants: The patient                            last took Effient (prasugrel) 5 days prior to the  procedure. ASA Grade Assessment: III - A patient                            with severe systemic disease. After reviewing the                            risks and benefits, the patient was deemed in                            satisfactory condition to undergo the procedure.                           After obtaining informed consent, the colonoscope                            was passed under direct vision. Throughout the                             procedure, the patient's blood pressure, pulse, and                            oxygen saturations were monitored continuously. The                            Colonoscope was introduced through the anus and                            advanced to the the cecum, identified by                            appendiceal orifice and ileocecal valve. The                            colonoscopy was performed without difficulty. The                            patient tolerated the procedure well. The quality                            of the bowel preparation was adequate. The                            ileocecal valve, appendiceal orifice, and rectum                            were photographed. Scope In: 2:14:23 PM Scope Out: 2:45:37 PM Scope Withdrawal Time: 0 hours 26 minutes 42 seconds  Total Procedure Duration: 0 hours 31 minutes 14 seconds  Findings:                 The perianal and digital rectal examinations were                            normal.  A 25 mm polyp was found in the proximal ascending                            colon. The polyp was multi-lobulated. The polyp was                            removed with a piecemeal technique using a hot                            snare. Resection and retrieval were complete. To                            prevent bleeding after the polypectomy, two                            hemostatic clips were successfully placed (MR                            conditional). There was no bleeding at the end of                            the procedure.                           Ten pedunculated and sessile and semi-pedunculated                            polyps were found in the rectum, transverse colon,                            hepatic flexure and ascending colon. The polyps                            were 4 to 12 mm in size. These polyps were removed                            with a hot snare. Resection and  retrieval were                            complete.                           Multiple small and large-mouthed diverticula were                            found in the sigmoid colon, descending colon,                            transverse colon and ascending colon. There was                            evidence of diverticular spasm.  Non-bleeding internal hemorrhoids were found during                            retroflexion. The hemorrhoids were small. Complications:            No immediate complications. Estimated Blood Loss:     Estimated blood loss was minimal. Impression:               - One 25 mm polyp in the proximal ascending colon,                            removed piecemeal using a hot snare. Resected and                            retrieved. Clips (MR conditional) were placed.                           - Ten 4 to 12 mm polyps in the rectum, in the                            transverse colon, at the hepatic flexure and in the                            ascending colon, removed with a hot snare. Resected                            and retrieved.                           - Moderate diverticulosis in the sigmoid colon, in                            the descending colon, in the transverse colon and                            in the ascending colon. There was evidence of                            diverticular spasm.                           - Non-bleeding internal hemorrhoids. Recommendation:           - Patient has a contact number available for                            emergencies. The signs and symptoms of potential                            delayed complications were discussed with the                            patient. Return to normal activities tomorrow.  Written discharge instructions were provided to the                            patient.                           - Resume previous diet.                           -  Continue present medications.                           - Resume Effient (prasugrel) at prior dose in 2                            days. Refer to managing physician for further                            adjustment of therapy.                           - Await pathology results.                           - Repeat colonoscopy in 1 year for surveillance of                            multiple polyps. Mauri Pole, MD 10/26/2020 3:24:29 PM This report has been signed electronically.

## 2020-10-26 NOTE — Progress Notes (Signed)
Called to room to assist during endoscopic procedure.  Patient ID and intended procedure confirmed with present staff. Received instructions for my participation in the procedure from the performing physician.  

## 2020-10-26 NOTE — Progress Notes (Signed)
To PACU VSS. Report to [email or web address] 

## 2020-10-26 NOTE — Patient Instructions (Signed)
Please read handouts provided. Continue present medications. Resume Effient ( prasugrel ) at prior dose in 2 days. Repeat colonoscopy in 1 year. Resume previous diet.      YOU HAD AN ENDOSCOPIC PROCEDURE TODAY AT [facility]:   Refer to the procedure report that was given to you for any specific questions about what was found during the examination.  If the procedure report does not answer your questions, please call your gastroenterologist to clarify.  If you requested that your care partner not be given the details of your procedure findings, then the procedure report has been included in a sealed envelope for you to review at your convenience later.  YOU SHOULD EXPECT: Some feelings of bloating in the abdomen. Passage of more gas than usual.  Walking can help get rid of the air that was put into your GI tract during the procedure and reduce the bloating. If you had a lower endoscopy (such as a colonoscopy or flexible sigmoidoscopy) you may notice spotting of blood in your stool or on the toilet paper. If you underwent a bowel prep for your procedure, you may not have a normal bowel movement for a few days.  Please Note:  You might notice some irritation and congestion in your nose or some drainage.  This is from the oxygen used during your procedure.  There is no need for concern and it should clear up in a day or so.  SYMPTOMS TO REPORT IMMEDIATELY:   Following lower endoscopy (colonoscopy or flexible sigmoidoscopy):  Excessive amounts of blood in the stool  Significant tenderness or worsening of abdominal pains  Swelling of the abdomen that is new, acute  Fever of 100F or higher   Following upper endoscopy (EGD)  Vomiting of blood or coffee ground material  New chest pain or pain under the shoulder blades  Painful or persistently difficult swallowing  New shortness of breath  Fever of 100F or higher  Black, tarry-looking stools  For urgent or emergent issues,  a gastroenterologist can be reached at any hour by calling [phone]. Do not use MyChart messaging for urgent concerns.    DIET:  We do recommend a small meal at first, but then you may proceed to your regular diet.  Drink plenty of fluids but you should avoid alcoholic beverages for 24 hours.  ACTIVITY:  You should plan to take it easy for the rest of today and you should NOT DRIVE or use heavy machinery until tomorrow (because of the sedation medicines used during the test).    FOLLOW UP: Our staff will call the number listed on your records 48-72 hours following your procedure to check on you and address any questions or concerns that you may have regarding the information given to you following your procedure. If we do not reach you, we will leave a message.  We will attempt to reach you two times.  During this call, we will ask if you have developed any symptoms of COVID 19. If you develop any symptoms (ie: fever, flu-like symptoms, shortness of breath, cough etc.) before then, please call [phone].  If you test positive for Covid 19 in the 2 weeks post procedure, please call and report this information to [place].    If any biopsies were taken you will be contacted by phone or by letter within the next 1-3 weeks.  Please call us at [phone] if you have not heard about the biopsies in 3 weeks.  SIGNATURES/CONFIDENTIALITY: You and/or your care partner have signed paperwork which will be entered into your electronic medical record.  These signatures attest to the fact that that the information above on your After Visit Summary has been reviewed and is understood.  Full responsibility of the confidentiality of this discharge information lies with you and/or your care-partner.

## 2020-10-27 ENCOUNTER — Encounter: Payer: Self-pay | Admitting: Gastroenterology

## 2020-10-28 ENCOUNTER — Telehealth: Payer: Self-pay

## 2020-10-28 NOTE — Telephone Encounter (Signed)
  Follow up Call-  Call back number 10/26/2020  Post procedure Call Back phone  # [phone]  Permission to leave phone message Yes  Some recent data might be hidden     Patient questions:  Do you have a fever, pain , or abdominal swelling? No. Pain Score  0 *  Have you tolerated food without any problems? Yes.    Have you been able to return to your normal activities? Yes.    Do you have any questions about your discharge instructions: Diet   No. Medications  No. Follow up visit  No.  Do you have questions or concerns about your Care? No.  Actions: * If pain score is 4 or above: No action needed, pain <4.  1. Have you developed a fever since your procedure? No  2.   Have you had an respiratory symptoms (SOB or cough) since your procedure? No  3.   Have you tested positive for COVID 19 since your procedure No  4.   Have you had any family members/close contacts diagnosed with the COVID 19 since your procedure?  No  If yes to any of these questions please route to Joylene John, RN and Joella Prince, RN

## 2020-11-01 ENCOUNTER — Ambulatory Visit (INDEPENDENT_AMBULATORY_CARE_PROVIDER_SITE_OTHER): Payer: Medicare Other | Admitting: Psychiatry

## 2020-11-01 DIAGNOSIS — N200 Calculus of kidney: Secondary | ICD-10-CM | POA: Diagnosis not present

## 2020-11-01 DIAGNOSIS — F411 Generalized anxiety disorder: Secondary | ICD-10-CM | POA: Diagnosis not present

## 2020-11-01 DIAGNOSIS — K760 Fatty (change of) liver, not elsewhere classified: Secondary | ICD-10-CM | POA: Diagnosis not present

## 2020-11-01 NOTE — Progress Notes (Signed)
[facility] Counselor/Therapist Progress Note  Patient ID: Sergio Griffin, MRN: [id number],    Date: 11/01/2020  Time Spent: 50 minutes   11:00am to 11:50am  Virtual Visit Note via MyChart Video:  Session started on MyChart with patient however he had an issue with the MyChart and could not get it resolved.  We stayed connected and talked rest of session but video was not working correctly on his end.  Patient is having some quite serious medical concerns right now, so we continued the call rather than trying to re-schedule. Connected with patient by a video enabled telemedicine/telehealth application or telephone, with their informed consent, and verified patient privacy and that I am speaking with the correct person using two identifiers. I discussed the limitations, risks, security and privacy concerns of performing psychotherapy and management service by telephone and the availability of in person appointments. I also discussed with the patient that there may be a patient responsible charge related to this service. The patient expressed understanding and agreed to proceed. I discussed the treatment planning with the patient. The patient was provided an opportunity to ask questions and all were answered. The patient agreed with the plan and demonstrated an understanding of the instructions. The patient was advised to call  our office if  symptoms worsen or feel they are in a crisis state and need immediate contact.   Therapist Location: [facility] Patient Location: home   Treatment Type: Individual Therapy  Reported Symptoms: anxiety, apprehension, fears, anger, "some depression but more anxiety"  Mental Status Exam:  Appearance:   n/a   telehealth     Behavior:  Appropriate and Sharing  Motor:  Normal  Speech/Language:   Clear and Coherent  Affect:  n/a  telehealth  Mood:  anxious and depressed  Thought process:  goal directed  Thought content:    WNL   Sensory/Perceptual disturbances:    WNL  Orientation:  oriented to person, place, time/date, situation, day of week, month of year and year  Attention:  Good  Concentration:  Good and Fair  Memory:  WNL  Fund of knowledge:   Good  Insight:    Good and Fair  Judgment:   Good and Fair  Impulse Control:  Good   Risk Assessment: Danger to Self:  No Self-injurious Behavior: No Danger to Others: No Duty to Warn:no Physical Aggression / Violence:No  Access to Firearms a concern: No  Gang Involvement:No   Subjective: Patient today report anxiety, depression, anger, fears, apprehension. "Doing better than I was last year!"  Interventions: Solution-Oriented/Positive Psychology and Ego-Supportive  Diagnosis:   ICD-10-CM   1. Generalized anxiety disorder  F41.1      Plan: Patient not signing tx goal updates on computer screen due to [place].  Treatment Goals: Goals remain on tx plan as patient works on strategies to [employer].Progress noted each session and documented in "Progress" section of tx plan.  Long term goal:(measurable) Develop healthy cognitive patterns and beliefs about himself and the world that lead to alleviation of depression and anxiety.Include physical movement daily as possible including walking. Overall goal is for patient to reach a point where he usesacquired anxiety/stress/depression management skills, and reports his depression and anxiety remain below a "3 on a 10 pt scale" for at least 2 months. (Currently reporting a "7" for anxiety and depression.)  Short term goal: Learn and implement personal skills for managing stress, solving daily problems, and resolving conflicts effectively.  Strategy: Teach patient calming  skills, problem solving skills, conflict resolution skills, to manage daily stressors.  PROGRESS: Patient today reports anxiety, depression, angry, apprehension, and fears. Very anxious but actually is managing the symptoms  noted above "better than I used to manage stress." "Had colonoscopy and endoscopy this past week.  Had been in hospital for diverticulitis a month ago".  Reportedly Dr found several polyps and removed them from colon.  Is awaiting pathology results from those, and patient more anxious "especially since a lot of my peers have died over past couple years."  Has 3 kidney stones that have been painful. Shares how badly the pain and fatigue has been for him and his fearful thoughts about his multiple health issues, especially the pathology report he is awaiting results from his colonoscopy.  Processed his fears at length in session today and seemed to feel more grounded by session end. Also reviewed prior work on interrupting anxious thoughts and replacing with more reality-based thoughts that do not supprt anxiety.  Appreciated being able to talk and feel heard, as he hasn't shared his personal health concern except his son and this therapist and his med provider here at [facility]. Encouraged patient in practicing good self-care including staying in touch with supportive people, staying on meds as prescribed, not assuming "the worst", getting outside some each day, using positive and encouraging self-talk, using some of his books and puzzles to provide mental stimulation and enjoyment, trying to stay in the present versus the past or future, focusing on what he can control rather than what he cannot control, and using healthy boundaries with family and others as needed.  Goal review and progress/challenges noted with patient.  Next appt within 2-3 weeks.   Shanon Ace, LCSW

## 2020-11-14 ENCOUNTER — Encounter: Payer: Self-pay | Admitting: Gastroenterology

## 2020-11-15 DIAGNOSIS — I2510 Atherosclerotic heart disease of native coronary artery without angina pectoris: Secondary | ICD-10-CM | POA: Diagnosis not present

## 2020-11-15 DIAGNOSIS — E782 Mixed hyperlipidemia: Secondary | ICD-10-CM | POA: Diagnosis not present

## 2020-11-15 DIAGNOSIS — I10 Essential (primary) hypertension: Secondary | ICD-10-CM | POA: Diagnosis not present

## 2020-11-15 DIAGNOSIS — R0600 Dyspnea, unspecified: Secondary | ICD-10-CM | POA: Diagnosis not present

## 2020-11-15 DIAGNOSIS — R0602 Shortness of breath: Secondary | ICD-10-CM | POA: Diagnosis not present

## 2020-11-17 DIAGNOSIS — R5383 Other fatigue: Secondary | ICD-10-CM | POA: Diagnosis not present

## 2020-11-17 DIAGNOSIS — R0602 Shortness of breath: Secondary | ICD-10-CM | POA: Diagnosis not present

## 2020-11-17 DIAGNOSIS — E785 Hyperlipidemia, unspecified: Secondary | ICD-10-CM | POA: Diagnosis not present

## 2020-11-17 DIAGNOSIS — R0609 Other forms of dyspnea: Secondary | ICD-10-CM | POA: Diagnosis not present

## 2020-11-17 DIAGNOSIS — I10 Essential (primary) hypertension: Secondary | ICD-10-CM | POA: Diagnosis not present

## 2020-11-25 ENCOUNTER — Telehealth: Payer: Self-pay | Admitting: Gastroenterology

## 2020-11-25 NOTE — Telephone Encounter (Signed)
Pt is requesting a call back from a nurse to discuss his constant constipation.

## 2020-11-25 NOTE — Telephone Encounter (Signed)
Left message on machine to call back  

## 2020-11-28 NOTE — Telephone Encounter (Signed)
Inbound call from patient returning your call. 

## 2020-11-29 NOTE — Telephone Encounter (Signed)
Left message to please call back if he is still having problems/symptoms

## 2020-12-01 ENCOUNTER — Encounter (INDEPENDENT_AMBULATORY_CARE_PROVIDER_SITE_OTHER): Payer: Medicare Other | Admitting: Psychiatry

## 2020-12-01 DIAGNOSIS — F411 Generalized anxiety disorder: Secondary | ICD-10-CM

## 2020-12-13 NOTE — Progress Notes (Signed)
This encounter was created in error - please disregard. This encounter was created in error - please disregard. This encounter was created in error - please disregard. 

## 2021-01-03 ENCOUNTER — Ambulatory Visit (INDEPENDENT_AMBULATORY_CARE_PROVIDER_SITE_OTHER): Payer: Medicare Other | Admitting: Psychiatry

## 2021-01-03 DIAGNOSIS — F411 Generalized anxiety disorder: Secondary | ICD-10-CM | POA: Diagnosis not present

## 2021-01-03 NOTE — Progress Notes (Signed)
[facility] Counselor/Therapist Progress Note  Patient ID: Sergio Griffin, MRN: [id number],    Date: 01/03/2021  Time Spent: 60 minutes  12:00noon to 1:00pm  Virtual Visit Note via Programmer, systems with patient by a video enabled telemedicine/telehealth application or telephone, with their informed consent, and verified patient privacy and that I am speaking with the correct person using two identifiers. I discussed the limitations, risks, security and privacy concerns of performing psychotherapy and management service by telephone and the availability of in person appointments. I also discussed with the patient that there may be a patient responsible charge related to this service. The patient expressed understanding and agreed to proceed. I discussed the treatment planning with the patient. The patient was provided an opportunity to ask questions and all were answered. The patient agreed with the plan and demonstrated an understanding of the instructions. The patient was advised to call  our office if  symptoms worsen or feel they are in a crisis state and need immediate contact.   Therapist Location: [facility] Patient Location: home  Treatment Type: Individual Therapy  Reported Symptoms: anxiety, depression, anger, sadness and grief re: mother's death and "hospital not listening to me about information on my mother".  Mental Status Exam:  Appearance:   Casual     Behavior:  Appropriate, Sharing and Motivated  Motor:  Normal.  Reports using a cane and just bought a "nicer, new one".  Speech/Language:   Clear and Coherent  Affect:  anxious, depressed, angry  Mood:  angry, anxious, depressed and sad  Thought process:  goal directed  Thought content:    WNL  Sensory/Perceptual disturbances:    WNL  Orientation:  oriented to person, place, time/date, situation, day of week, month of year and year  Attention:  Good  Concentration:  Good  Memory:  occasional  forgetting and worse under stress  Fund of knowledge:   Good  Insight:    Good  Judgment:   Good  Impulse Control:  Good   Risk Assessment: Danger to Self:  No Self-injurious Behavior: No Danger to Others: No Duty to Warn:no Physical Aggression / Violence:No  Access to Firearms a concern: No  Gang Involvement:No   Subjective: Patient today reports anxiety as main symptom, along with depression, anger, and sadness. Grief--mother's death.  Interventions: Solution-Oriented/Positive Psychology and Grief Therapy  Diagnosis:   ICD-10-CM   1. Generalized anxiety disorder  F41.1     Plan: Patient not signing tx goal updates on computer screen due to [place].  Treatment Goals: Goals remain on tx plan as patient works on strategies to [employer].Progress noted each session and documented in "Progress" section of tx plan.  Long term goal:(measurable) Develop healthy cognitive patterns and beliefs about himself and the world that lead to alleviation of depression and anxiety.Include physical movement daily as possible including walking. Overall goal is for patient to reach a point where he usesacquired anxiety/stress/depression management skills, and reports his depression and anxiety remain below a "3 on a 10 pt scale" for at least 2 months. (Currently reporting a "7" for anxiety and depression.)  Short term goal: Learn and implement personal skills for managing stress, solving daily problems, and resolving conflicts effectively.  Strategy: Teach patient calming skills, problem solving skills, conflict resolution skills, to manage daily stressors.  PROGRESS: Patient today reporting anxiety, anger, sadness, depression, and grief re: 104 yr old mother's recent death. She was taken to hospital due to a fall.  UTI  led to sepsis. Patient venting anger re: his warning hospital staff about mother's tendency to get UTI from foley catheters and the hospital reportedly used them  any way at least 2 occasions and patient again had minded medical staff at hospital "to not use the foley catheter due to mother's getting UTI's".  Mother died within few days and patient states the reason for death on Death Certificate included "sepsis per UTI". Processed his anger, sadness, and frustration about this since he had warned hospital staff at least twice about her history of bad UTI's due to foley catheters. Patient needed session today to vent his tearfulness, frustration, sadness, anger, stress, anxiety, and depression.  Did seem to feel calmer and more grounded towards session end. Patient shared that his son has been very supportive with all that's been going on for patient the past month especially. Patient reports he is still having some issues with kidney stones and is to check back with his doctor soon for next steps.  Patient seemed to really appreciate a chance to talk uninterrupted and feel heard at a time when he has felt some vulnerable.  Encouraged him to continue positive self-care and self talk, spending some time doing activities he enjoys including books and puzzles, staying in the present versus the past or future, focus on what he can control, setting good boundaries with others as needed, getting outside some each day, looking for the positives versus the negatives, and staying in touch with people who are supportive of him.  Goal review and progress/challenges noted with patient.  Next appt within 3 weeks.  He also knows he can call sooner if needed.   Shanon Ace, LCSW

## 2021-01-31 ENCOUNTER — Ambulatory Visit: Payer: Medicare Other | Admitting: Psychiatry

## 2021-02-09 ENCOUNTER — Other Ambulatory Visit: Payer: Self-pay

## 2021-02-09 ENCOUNTER — Telehealth: Payer: Self-pay

## 2021-02-09 ENCOUNTER — Telehealth: Payer: Self-pay | Admitting: Gastroenterology

## 2021-02-09 DIAGNOSIS — K5732 Diverticulitis of large intestine without perforation or abscess without bleeding: Secondary | ICD-10-CM

## 2021-02-09 DIAGNOSIS — R1032 Left lower quadrant pain: Secondary | ICD-10-CM

## 2021-02-09 MED ORDER — AMOXICILLIN-POT CLAVULANATE 875-125 MG PO TABS: 1.0000 | ORAL_TABLET | Freq: Two times a day (BID) | ORAL | 0 refills | Status: DC

## 2021-02-09 NOTE — Telephone Encounter (Signed)
Please send Rx for Augmentin BID X 10 days for possible diverticulitis, he has h/o diverticulitis. Schedule CT abd & pelvis with contrast . If he has worsening symptoms will need to come to ER.

## 2021-02-09 NOTE — Telephone Encounter (Signed)
Called patient back and he states for the last 3 days he has had LLQ pain "5" that is intermittent, Pressure above his pubic area and BRB on his tissue and in the toliet after BMs. This has happened 3 times. He has gone to a bland diet with no improvement. Denies SOB, dizziness,  or being light-headed. Please advise

## 2021-02-09 NOTE — Telephone Encounter (Signed)
Pt called to inform that he has been experiencing pain in the left lower quadrant of his abd. Yesterday he noticed some brigh red blood in the toilet after a bm. He would like some advise.

## 2021-02-09 NOTE — Progress Notes (Signed)
a 

## 2021-02-09 NOTE — Telephone Encounter (Signed)
Sent order to patient's pharmacy for Augmentin and gave instructions for taking. Scheduled CT-abd/pelvis w/contrast on 02/14/21 at [facility]. Patient to arrive at 2:45pm and be NPO 4  Hours before except for the contrast. 2 bottles of contrast put at the front desk and patient to pick-up at 8:00am on Tuesday when he comes in town for his BMP to check his creatine. Patient to drink 1st bottle at 1:00pm and 2nd bottle at 2:00pm. Let patient know Dr. Silverio Decamp would like him to go to the ER is his symptoms worsen.

## 2021-02-09 NOTE — Telephone Encounter (Signed)
Spoke to Dr. Silverio Decamp and received order for 875mg  on the  Augmentin.

## 2021-02-14 ENCOUNTER — Other Ambulatory Visit (INDEPENDENT_AMBULATORY_CARE_PROVIDER_SITE_OTHER): Payer: Medicare Other

## 2021-02-14 ENCOUNTER — Ambulatory Visit (HOSPITAL_COMMUNITY)
Admission: RE | Admit: 2021-02-14 | Discharge: 2021-02-14 | Disposition: A | Discharge status: Home / Self Care | Payer: Medicare Other | Source: Ambulatory Visit | Attending: Gastroenterology | Admitting: Gastroenterology

## 2021-02-14 ENCOUNTER — Other Ambulatory Visit: Payer: Self-pay

## 2021-02-14 DIAGNOSIS — K5732 Diverticulitis of large intestine without perforation or abscess without bleeding: Secondary | ICD-10-CM

## 2021-02-14 DIAGNOSIS — R1032 Left lower quadrant pain: Secondary | ICD-10-CM | POA: Insufficient documentation

## 2021-02-14 LAB — BASIC METABOLIC PANEL
BUN: 35 mg/dL — ABNORMAL HIGH (ref 6–23)
CO2: 30 mEq/L (ref 19–32)
Calcium: 9.7 mg/dL (ref 8.4–10.5)
Chloride: 99 mEq/L (ref 96–112)
Creatinine, Ser: 1.73 mg/dL — ABNORMAL HIGH (ref 0.40–1.50)
GFR: 39.35 mL/min — ABNORMAL LOW (ref >60.00)
Glucose, Bld: 150 mg/dL — ABNORMAL HIGH (ref 70–99)
Potassium: 4.5 mEq/L (ref 3.5–5.1)
Sodium: 139 mEq/L (ref 135–145)

## 2021-02-14 MED ORDER — IOHEXOL 300 MG/ML  SOLN
100.0000 mL | Freq: Once | INTRAMUSCULAR | Status: AC | PRN
  Administered 2021-02-14: 80 mL via INTRAVENOUS

## 2021-02-28 ENCOUNTER — Ambulatory Visit: Payer: Medicare Other | Admitting: Psychiatry

## 2021-03-04 IMAGING — DX DG CHEST 1V PORT
1 series · 2 of 2 positions shown · non-contrast
Comparison: 06/17/2015

CLINICAL DATA: Dyspnea on exertion, hypertension, tachycardia

EXAM:
PORTABLE CHEST 1 VIEW

[Series 2: chest ap grid · 0.14mm/px · 2 of 2 slices shown]
[im 1/2]
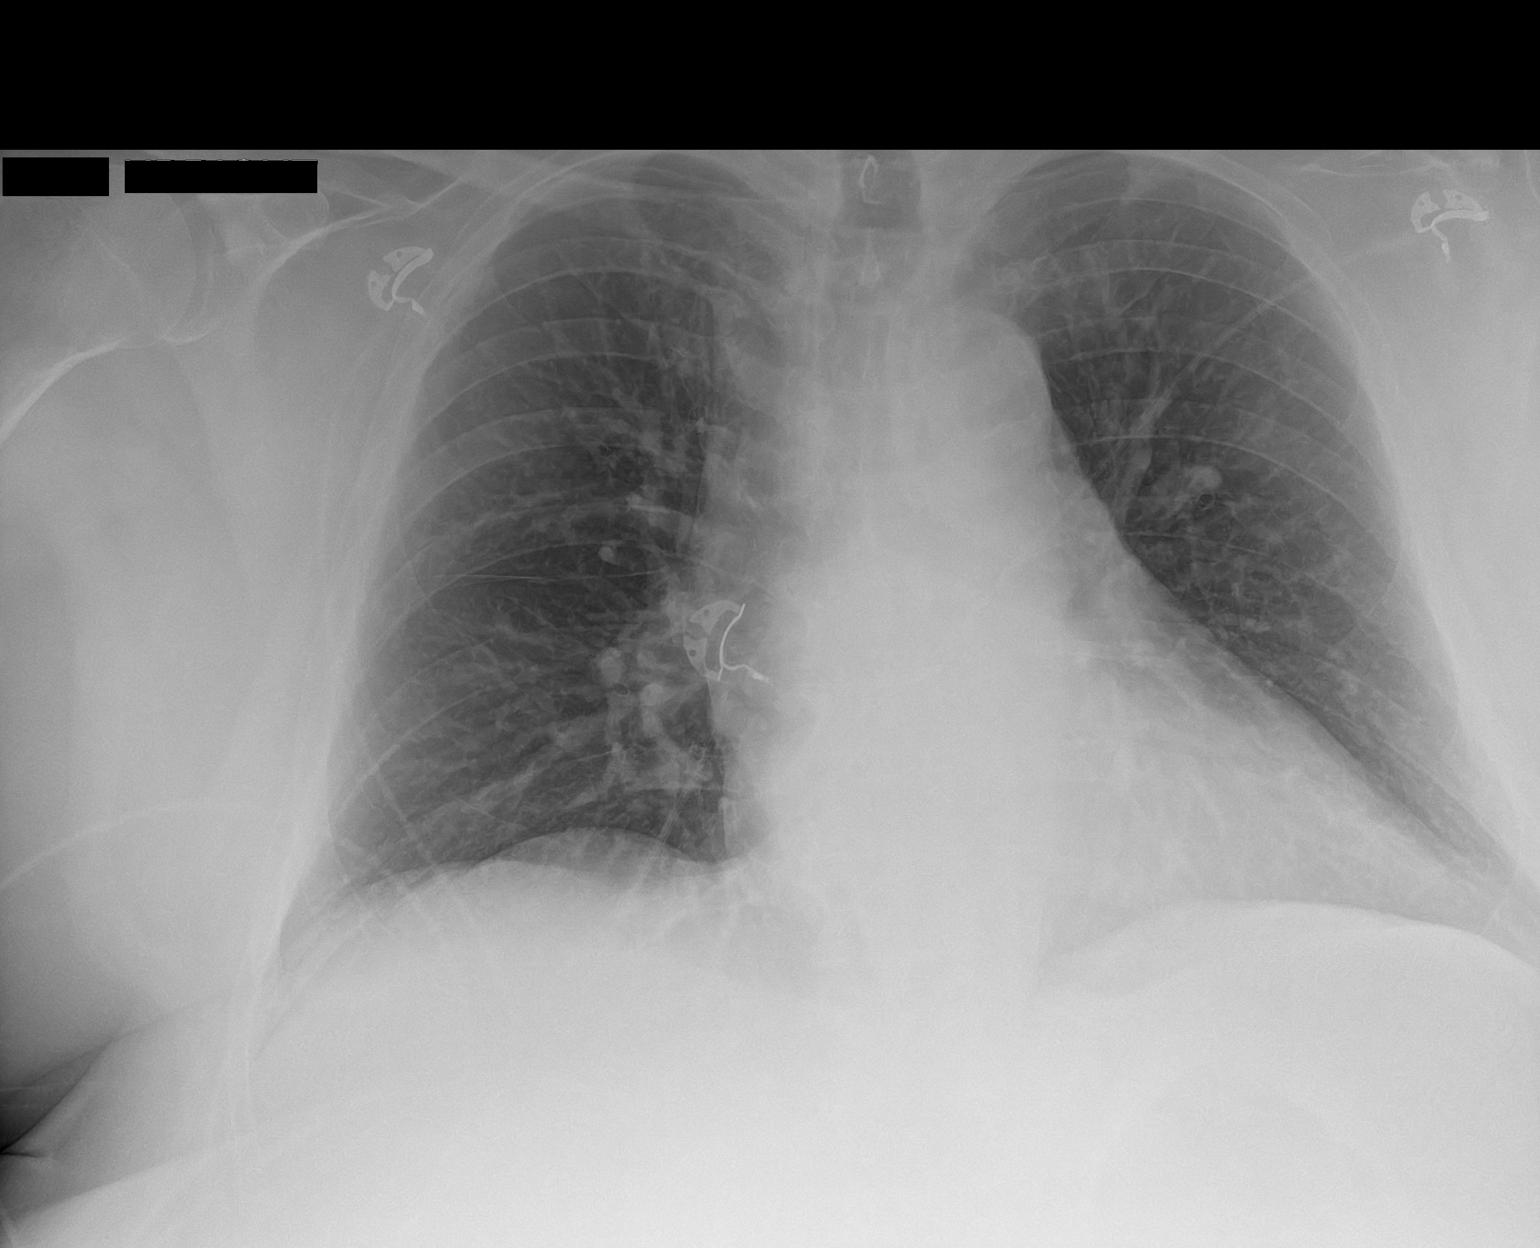
[im 2/2]
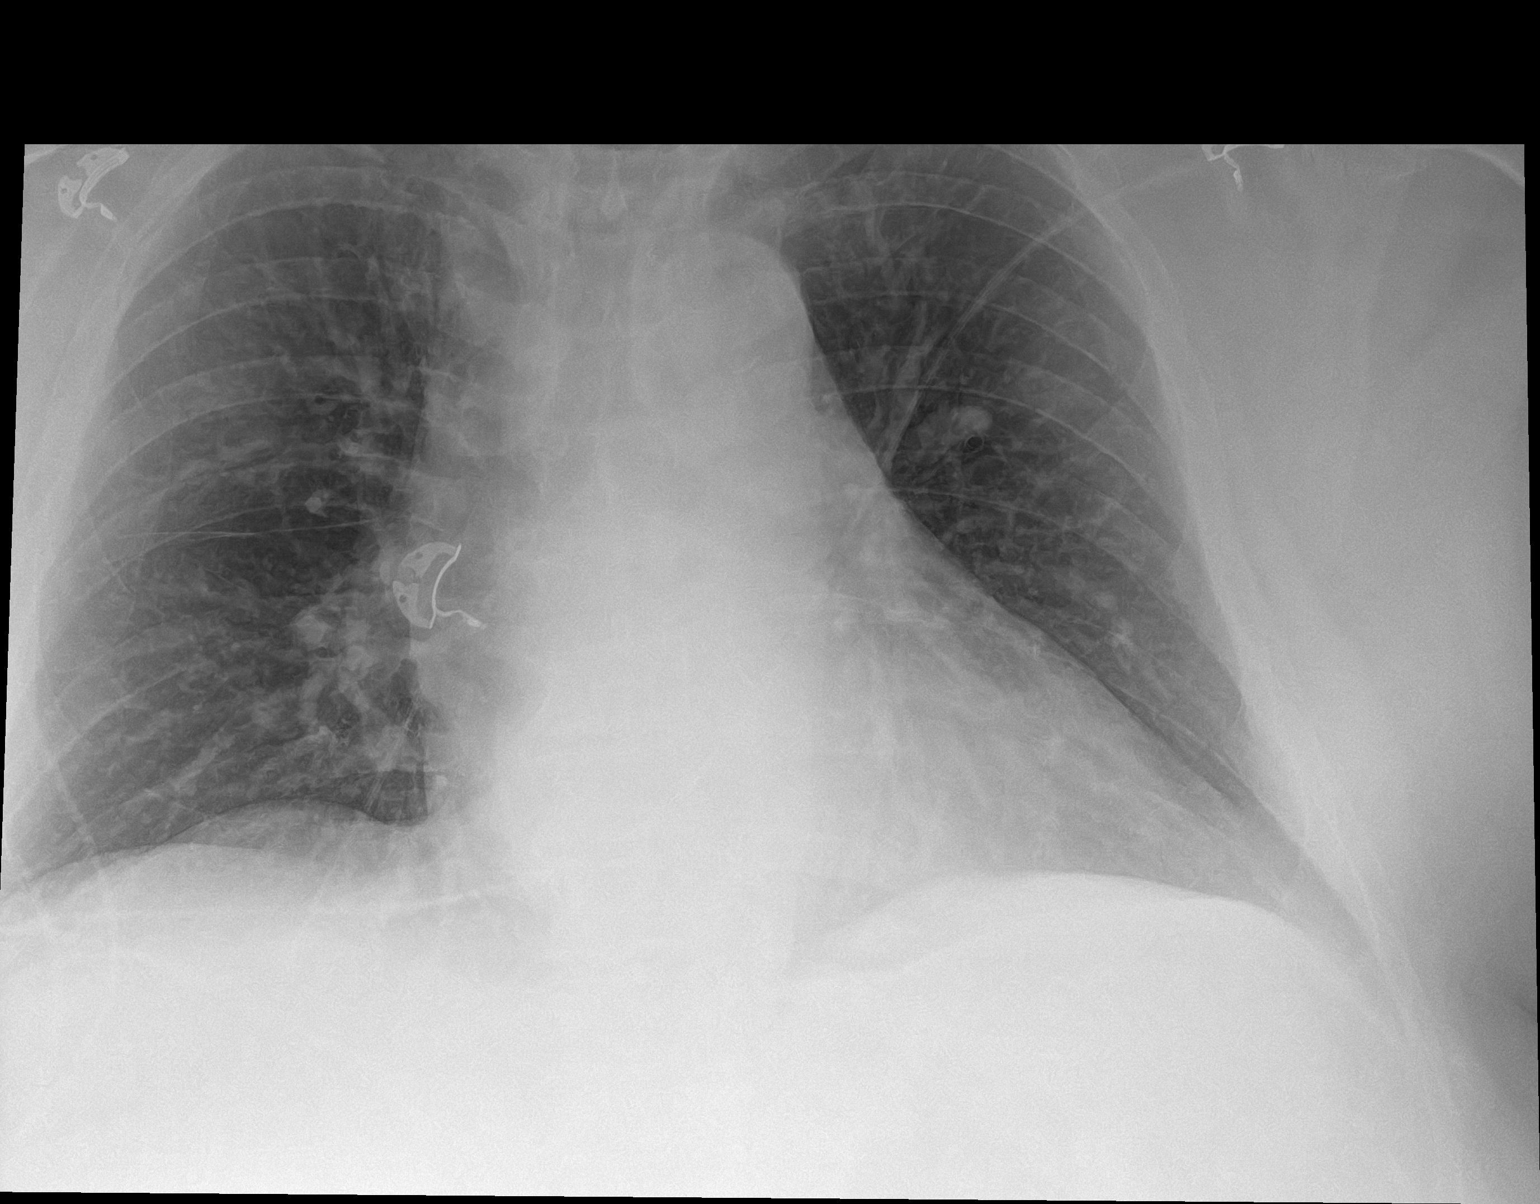

[2 of 2 positions shown; findings below may reference images not displayed]

FINDINGS: The heart size and mediastinal contours are within normal limits.
Both lungs are clear. The visualized skeletal structures are
unremarkable.
IMPRESSION: No active disease.

## 2021-03-31 ENCOUNTER — Encounter: Payer: Medicare Other | Admitting: Psychiatry

## 2021-03-31 ENCOUNTER — Ambulatory Visit (INDEPENDENT_AMBULATORY_CARE_PROVIDER_SITE_OTHER): Payer: Medicare Other | Admitting: Psychiatry

## 2021-03-31 DIAGNOSIS — F411 Generalized anxiety disorder: Secondary | ICD-10-CM

## 2021-03-31 NOTE — Progress Notes (Signed)
[facility] Counselor/Therapist Progress Note  Patient ID: Sergio Griffin, MRN: [id number],    Date: 03/31/2021  Time Spent: 50 minutes    10:10am to 11:00am  Virtual Visit via [place] with patient by a video enabled telemedicine/telehealth application or telephone, with their informed consent, and verified patient privacy and that I am speaking with the correct person using two identifiers. I discussed the limitations, risks, security and privacy concerns of performing psychotherapy and management service by telephone and the availability of in person appointments. I also discussed with the patient that there may be a patient responsible charge related to this service. The patient expressed understanding and agreed to proceed. I discussed the treatment planning with the patient. The patient was provided an opportunity to ask questions and all were answered. The patient agreed with the plan and demonstrated an understanding of the instructions. The patient was advised to call  our office if  symptoms worsen or feel they are in a crisis state and need immediate contact.   Therapist Location: [facility] Patient Location: home  Treatment Type: Individual Therapy  Reported Symptoms: anxiety,depression, grief especially since Mother's Day, anger  Mental Status Exam:  Appearance:   Casual     Behavior:  Appropriate, Sharing and Motivated  Motor:  Normal  Speech/Language:   Clear and Coherent  Affect:  anxious, depressed, grief  Mood:  anxious, depressed and grief  Thought process:  goal directed  Thought content:    some ruminating  Sensory/Perceptual disturbances:    WNL  Orientation:  oriented to person, place, time/date, situation, day of week, month of year and year  Attention:  Good  Concentration:  Good and Fair  Memory:  WNL  Fund of knowledge:   Good  Insight:    Good  Judgment:   Good  Impulse Control:  Good   Risk Assessment: Danger  to Self:  No Self-injurious Behavior: No Danger to Others: No Duty to Warn:no Physical Aggression / Violence:No  Access to Firearms a concern: No  Gang Involvement:No   Subjective:  Patient today reports anxiety, depression, grief, anger, frustration, and tiredness, and struggles with physical issues and recent surgery. Really needed session today to talk, be supported, and have some suggestions for coping wth his stressors. Shares that his grief worsened around Mother's Day and talked about this in session today including particular memories that he has struggled with.  Added that it helps to be able to talk today and feel heard and supported. Concerned about his health issues, his grief, recovering slowly from recent surgery, his brother who has emotional issues and who is depending heavily on patient,and concerned about finances. Sources of support for patient are limited, and he struggles with loneliness.  Was able to openly share his concerns today about the above mentioned issues, and he was definitely calmer and more grounded by session end.  It had been a while since he was able to have an appt due to other physical problems and personal circumstances.   Interventions: Solution-Oriented/Positive Psychology, Ego-Supportive and Grief Therapy  Diagnosis:   ICD-10-CM   1. Generalized anxiety disorder  F41.1      Plan: Patient not signing tx goal updates on computer screen due to [place].  Treatment Goals: Goals remain on tx plan as patient works on strategies to [employer].Progress noted each session and documented in "Progress" section of tx plan.  Long term goal:(measurable) Develop healthy cognitive patterns and beliefs about himself and  the world that lead to alleviation of depression and anxiety.Include physical movement daily as possible including walking. Overall goal is for patient to reach a point where he usesacquired anxiety/stress/depression management skills,  and reports his depression and anxiety remain below a "3 on a 10 pt scale" for at least 2 months. (Currently reporting a "6" for anxiety and depression; was a "7" last session.)  Short term goal: Learn and implement personal skills for managing stress, solving daily problems, and resolving conflicts effectively.  Strategy: Teach patient calming skills, problem solving skills, conflict resolution skills, to manage daily stressors.  PROGRESS / PLAN: Patient today reports anxiety, depression, frustration, grief, anger, teary at times, and tired as noted above in "Subjective" section of Note. Also having more physical issues and recent surgery. Loneliness is significant issue.  Denies any SI. After talking today, patient states he understands he really needs to focus more on taking better care of himself.  Has cut out sugar, healthier diet and hydration. Has lost 17 lbs in about 7-8 wks, and is doing some chair exercises.  Foot surgery has limited him some in movement. Encouraged patient to be in more contact with his 2 adult children and friends that he has spoken of previously.  Encourage patient also to reach out to others within his apartment community and friends from nearby churches.  Also encouraged patient to work on staying in the present focusing on what he can control versus cannot, to pick back up on some of the activities he enjoyed previously including books and puzzles, to practice more consistently positive self-care and positive self talk, set good boundaries with others as needed, get outside some each day, continue his healthier diet and chair exercises, intentionally look for positives daily stay in touch with people who are supportive of him and feel good about the strength he is showing in the midst of challenging circumstances as he works to feel better and more confident in moving forward in a more positive direction.  Goal review and progress/challenges noted with  patient.  Next appt within 2-3 weeks.    Shanon Ace, LCSW

## 2021-03-31 NOTE — Progress Notes (Signed)
This encounter was created in error - please disregard.

## 2021-04-18 ENCOUNTER — Other Ambulatory Visit: Payer: Self-pay | Admitting: Psychiatry

## 2021-04-18 DIAGNOSIS — F410 Panic disorder [episodic paroxysmal anxiety] without agoraphobia: Secondary | ICD-10-CM

## 2021-04-18 DIAGNOSIS — F331 Major depressive disorder, recurrent, moderate: Secondary | ICD-10-CM

## 2021-04-20 ENCOUNTER — Telehealth: Payer: Self-pay | Admitting: Psychiatry

## 2021-04-20 NOTE — Telephone Encounter (Signed)
Next apt 7/13

## 2021-04-20 NOTE — Telephone Encounter (Signed)
Patient called for refill on two prescriptions Wellbutrin 300mg  and Klonopin 0.5mg . Appt 7/13. Pharmacy [place]

## 2021-04-21 ENCOUNTER — Other Ambulatory Visit: Payer: Self-pay

## 2021-04-21 NOTE — Telephone Encounter (Signed)
Rx's pended for [name] to review and send

## 2021-05-03 ENCOUNTER — Ambulatory Visit (INDEPENDENT_AMBULATORY_CARE_PROVIDER_SITE_OTHER): Payer: Medicare Other | Admitting: Psychiatry

## 2021-05-03 DIAGNOSIS — F411 Generalized anxiety disorder: Secondary | ICD-10-CM | POA: Diagnosis not present

## 2021-05-03 NOTE — Progress Notes (Signed)
[facility] Counselor/Therapist Progress Note  Patient ID: Sergio Griffin, MRN: [id number],    Date: 05/03/2021  Time Spent: 60 minutes   Virtual Visit via MyChart Video Note  Connected with patient by a video enabled telemedicine/telehealth application or telephone, with their informed consent, and verified patient privacy and that I am speaking with the correct person using two identifiers. I discussed the limitations, risks, security and privacy concerns of performing psychotherapy and management service by telephone and the availability of in person appointments. I also discussed with the patient that there may be a patient responsible charge related to this service. The patient expressed understanding and agreed to proceed. I discussed the treatment planning with the patient. The patient was provided an opportunity to ask questions and all were answered. The patient agreed with the plan and demonstrated an understanding of the instructions. The patient was advised to call  our office if  symptoms worsen or feel they are in a crisis state and need immediate contact.  (Did experience some problems with the video off and on but able to complete the session which was helpful to patient.)   Therapist Location: [facility] Patient Location: home    Treatment Type: Individual Therapy  Reported Symptoms: depression, anxiety, sadness, anger, stressed  Mental Status Exam:  Appearance:   Casual     Behavior:  Appropriate, Sharing, and Motivated  Motor:  Normal  Speech/Language:   Clear and Coherent  Affect:  Depressed and anxious, sad , angry  Mood:  angry, anxious, depressed, and sad  Thought process:  goal directed  Thought content:    WNL  Sensory/Perceptual disturbances:    WNL  Orientation:  oriented to person, place, time/date, situation, day of week, month of year, and year  Attention:  Good  Concentration:  Good  Memory:  WNL  Fund of knowledge:   Good  Insight:     Good  Judgment:   Good  Impulse Control:  Good   Risk Assessment: Danger to Self:  No Self-injurious Behavior: No Danger to Others: No Duty to Warn:no Physical Aggression / Violence:No  Access to Firearms a concern: No  Gang Involvement:No   Subjective:  Patient today reporting anxiety, depression, anger, sad, and stressed. Stressors include his own health concerns, his brother's health concerns with congestive heart failure, and "I feel like I knock myself out to meet brother's demands and I can't take care of him and myself as I have my own health issues and limitations like walking with a cane."  Did a good job in releasing a lot of frustration, anger, sadness, depressed and anxious thoughts/feelings in session today. Really needed session today to process his feelings as stated above and his sense of powerlessness at times to set better limits and boundaries with his brother, due to the lack of involvement with brother's son and his dad's health care.  Looked at some specific limits and boundaries patient can use and how he can communicate with brother about his limitations at this time because of his own health concerns that have grown.  Denies any SI.  Patient was more calm and grounded by the end of session and stated that it felt helpful to be able to talk through all of his thoughts and feelings without judgment and with support.  He realizes some things he can change but the one thing that he can hand change and needs to change is setting limits for his on health needs and challenges.  Interventions: Solution-Oriented/Positive Psychology and Insight-Oriented  Diagnosis:   ICD-10-CM   1. Generalized anxiety disorder  F41.1       Plan:  Patient today showing good motivation in working on his concerns and goals throughout our session.  Did seem to be feeling a little more less stressed and less depressed and anxious by the end of session, and felt good about being heard and  being able to discuss things in detail about his situation with his health and needing to set boundaries with his brother.  Continues to take better care of himself physically by following a healthier diet without sugar, and also staying more hydrated.  His foot surgery still limits him some and his movement.  Does some daily chair exercises which she finds helpful.  Has 2 adult children that are supportive.  Encouraged patient to reach out to others in his apartment community as well as friends nearby to at least have more contact with others, to stay in the present focusing on what he can control, to practice more consistent positive self-care and positive self talk, to pick back up on some of the activities he previously enjoyed including books and puzzles, maintain his healthier diet and chair exercises, look for more positives daily, set boundaries when needed with others, stay in contact with people who are supportive of him, and to recognize the strength he is showing as he works with goal-directed behaviors in difficult situations, trying to move forward in a more positive direction towards overall improved emotional health.  Goal review and progress/challenges noted with patient.  Next appt within 3-4 weeks.   Shanon Ace, LCSW

## 2021-05-18 ENCOUNTER — Other Ambulatory Visit: Payer: Self-pay | Admitting: Psychiatry

## 2021-05-18 DIAGNOSIS — F331 Major depressive disorder, recurrent, moderate: Secondary | ICD-10-CM

## 2021-05-31 ENCOUNTER — Encounter: Payer: Self-pay | Admitting: Psychiatry

## 2021-05-31 ENCOUNTER — Telehealth (INDEPENDENT_AMBULATORY_CARE_PROVIDER_SITE_OTHER): Payer: Medicare Other | Admitting: Psychiatry

## 2021-05-31 DIAGNOSIS — F331 Major depressive disorder, recurrent, moderate: Secondary | ICD-10-CM | POA: Diagnosis not present

## 2021-05-31 DIAGNOSIS — F410 Panic disorder [episodic paroxysmal anxiety] without agoraphobia: Secondary | ICD-10-CM

## 2021-05-31 DIAGNOSIS — F411 Generalized anxiety disorder: Secondary | ICD-10-CM

## 2021-05-31 MED ORDER — BUPROPION HCL ER (XL) 300 MG PO TB24: 300.0000 mg | ORAL_TABLET | Freq: Every day | ORAL | 1 refills | Status: DC

## 2021-05-31 MED ORDER — CLONAZEPAM 0.5 MG PO TABS: 0.5000 mg | ORAL_TABLET | Freq: Two times a day (BID) | ORAL | 1 refills | Status: DC | PRN

## 2021-05-31 MED ORDER — BUSPIRONE HCL 15 MG PO TABS: ORAL_TABLET | ORAL | 1 refills | Status: DC

## 2021-05-31 NOTE — Progress Notes (Signed)
Sergio Griffin [id number] May 07, 1951 70 y.o.  Virtual Visit via Video Note  I connected with pt @ on 05/31/21 at  8:30 AM EDT by a video enabled telemedicine application and verified that I am speaking with the correct person using two identifiers.   I discussed the limitations of evaluation and management by telemedicine and the availability of in person appointments. The patient expressed understanding and agreed to proceed.  I discussed the assessment and treatment plan with the patient. The patient was provided an opportunity to ask questions and all were answered. The patient agreed with the plan and demonstrated an understanding of the instructions.   The patient was advised to call back or seek an in-person evaluation if the symptoms worsen or if the condition fails to improve as anticipated.  I provided 30 minutes of non-face-to-face time during this encounter.  The patient was located at home.  The provider was located at [place].   Sergio Griffin, PMHNP   Subjective:   Patient ID:  Sergio Griffin is a 70 y.o. (DOB 04/15/1951) male.  Chief Complaint:  Chief Complaint  Patient presents with   Anxiety   Depression     Anxiety    Depression        Past medical history includes anxiety.   Sergio Griffin presents for follow-up of depression and anxiety. He reports that he lost his mother in January. Reports that his brother went through severe depression after mother's death and brother was hospitalized for fall and then sent to rehab. Brother was found to have GI bleed and was transferred to hospital and just returned to rehab last night. Brother and brother's son are estranged and Sergio Griffin reports, "I am more involved in his care more than I want to be."   He reports, "I'm doing very well." He reports that he has been working through his own health issues. He reports that depression has been manageable. He reports that he is trying to focus on the positive  more and re-direct negative thoughts. He reports anxiety "is still there" and he is trying to "leave the worry to other people," such as leaving caregiving responsibilities and decisions about brother's care for brother and rehab staff. He reports that muscle tension has improved. He reports sleeping ok and having some stress dreams. He reports that he is fatigued after walking longer distances and with the heat. He reports 25 lbs intentional wt loss in the last 3 months with limiting carbs. Denies SI.   He reports that he has been periodically knowing different people that are dying.   He reports that he does not use Klonopin daily and rarely needs more than one a day. He reports that he has not been taking Buspar recently. He reports that Buspar was causing him to sleep deeply and was taking only at night. He reports that he occasionally will take Buspar if he is feeling restless and concerned that he may not be able to sleep.   Son will be getting married in January.   Past Psychiatric Medication Trials: Prozac- Adverse effects Wellbutrin- helpful for depression Celexa- Affective dulling Lexapro Imipramine- Helpful for anxiety, worsened glaucoma Klonopin- effective Ambien-ineffective.  Buspar  Review of Systems:  Review of Systems  HENT:  Positive for sinus pressure.   Cardiovascular:        Reports postural hypotension  Musculoskeletal:        He reports difficulty ambulating longer distances.  Allergic/Immunologic: Positive for environmental allergies.  Psychiatric/Behavioral:  Positive for depression.        Please refer to HPI   Medications: I have reviewed the patient's current medications.  Current Outpatient Medications  Medication Sig Dispense Refill   acetaminophen (TYLENOL) 500 MG tablet Take 500-1,000 mg by mouth every 6 (six) hours as needed for moderate pain or headache.     amLODipine (NORVASC) 5 MG tablet Take 5 mg by mouth daily.     aspirin EC 81 MG tablet Take  81 mg by mouth at bedtime.      furosemide (LASIX) 20 MG tablet Take by mouth. Taking 40 mg po q am and 20 mg at bedtime     losartan (COZAAR) 50 MG tablet Take 100 mg by mouth daily.      metoprolol succinate (TOPROL-XL) 25 MG 24 hr tablet Take 12.5-25 mg by mouth See admin instructions. Take 25 mg in the morning and 12.5 mg in the evening     prasugrel (EFFIENT) 10 MG TABS tablet Take 1 tablet (10 mg total) by mouth at bedtime. Resume 48 hours after surgery 30 tablet    ammonium lactate (AMLACTIN) 12 % cream Apply topically 2 (two) times daily.     amoxicillin-clavulanate (AUGMENTIN) 875-125 MG tablet Take 1 tablet by mouth 2 (two) times daily. (Patient not taking: Reported on 05/31/2021) 20 tablet 0   buPROPion (WELLBUTRIN XL) 300 MG 24 hr tablet Take 1 tablet (300 mg total) by mouth daily. 90 tablet 1   busPIRone (BUSPAR) 15 MG tablet Take 2/3-1 tab po QHS 90 tablet 1   Carboxymethylcellul-Glycerin (LUBRICATING EYE DROPS OP) Place 1 drop into both eyes daily as needed (dry eyes).     [START ON 07/12/2021] clonazePAM (KLONOPIN) 0.5 MG tablet Take 1 tablet (0.5 mg total) by mouth 2 (two) times daily as needed. for anxiety 180 tablet 1   DM-APAP-CPM (CORICIDIN HBP PO) Take 1 tablet by mouth daily as needed (severe allergies).     ketoconazole (NIZORAL) 2 % shampoo Apply topically.     latanoprost (XALATAN) 0.005 % ophthalmic solution Place 1 drop into both eyes at bedtime.     lubiprostone (AMITIZA) 24 MCG capsule Take 1 capsule (24 mcg total) by mouth 2 (two) times daily with a meal. (Patient not taking: Reported on 10/26/2020) 60 capsule 3   nitroGLYCERIN (NITROSTAT) 0.4 MG SL tablet Place 1 tablet (0.4 mg total) under the tongue every 5 (five) minutes x 3 doses as needed for chest pain. (Patient not taking: Reported on 10/26/2020) 30 tablet 1   Omega-3 Fatty Acids (FISH OIL) 1000 MG CAPS Take 1,000 mg by mouth daily.     ondansetron (ZOFRAN) 4 MG tablet Take 4 mg by mouth every 8 (eight) hours as  needed for nausea or vomiting.     ondansetron (ZOFRAN-ODT) 4 MG disintegrating tablet DISSOLVE 1 TABLET IN MOUTH EVERY 8 HOURS AS NEEDED FOR NAUSEA FOR VOMITING (Patient taking differently: Take 4 mg by mouth every 8 (eight) hours as needed. ) 30 tablet 0   sucralfate (CARAFATE) 1 g tablet Take 1 tablet (1 g total) by mouth at bedtime.     No current facility-administered medications for this visit.    Medication Side Effects: None  Allergies:  Allergies  Allergen Reactions   Tamsulosin Other (See Comments)    Light headed and pass out   Ciprofloxacin Itching   Ibuprofen Diarrhea and Nausea Only   Propoxyphene Itching    Darvocet   Sulfa Antibiotics Itching    Past Medical History:  Diagnosis Date   Anxiety    Barrett's esophagus    CAD (coronary artery disease)    PCI, stent 2011   Cardiac arrhythmia    Chicken pox    Depression    Diverticulitis    Diverticulosis    Gastric ulcer    GERD (gastroesophageal reflux disease)    Glaucoma    Heart attack ([place]) 01/29/2019   History of myocardial infarction    Hyperlipidemia    Hypertension    Kidney stones    Kidney stones    Pneumonia 11/2014   Prostatitis    Reflux    Sleep apnea    UTI (lower urinary tract infection)     Family History  Problem Relation Age of Onset   Arthritis Mother    Hyperlipidemia Mother    Stroke Mother    Hypertension Mother    Ovarian cancer Mother    Hyperlipidemia Father    Heart disease Father    Hypertension Father    Non-Hodgkin's lymphoma Father    Hyperlipidemia Maternal Grandmother    Heart disease Maternal Grandmother    Stroke Maternal Grandmother    Hypertension Maternal Grandmother    Diabetes Maternal Grandmother    Hyperlipidemia Maternal Grandfather    Heart disease Maternal Grandfather    Hypertension Maternal Grandfather    Heart disease Paternal Grandmother    Heart disease Paternal Grandfather    Stroke Paternal Grandfather    Hypertension Paternal  Grandfather    Congestive Heart Failure Brother    Colon cancer Neg Hx    Esophageal cancer Neg Hx    Pancreatic cancer Neg Hx    Stomach cancer Neg Hx    Liver disease Neg Hx     Social History   Socioeconomic History   Marital status: Divorced    Spouse name: Not on file   Number of children: 2   Years of education: 16   Highest education level: Not on file  Occupational History   Occupation: Estate manager/land agent    Comment: Retired  Tobacco Use   Smoking status: Former    Pack years: 0.00    Types: Cigarettes   Smokeless tobacco: Never   Tobacco comments:    quit 1987  Vaping Use   Vaping Use: Never used  Substance and Sexual Activity   Alcohol use: No   Drug use: No   Sexual activity: Not Currently    Partners: Female  Other Topics Concern   Not on file  Social History Narrative   Fun: Paint, cook, travel   Denies religious beliefs effecting health care.    Social Determinants of Health   Financial Resource Strain: Not on file  Food Insecurity: Not on file  Transportation Needs: Not on file  Physical Activity: Not on file  Stress: Not on file  Social Connections: Not on file  Intimate Partner Violence: Not on file    Past Medical History, Surgical history, Social history, and Family history were reviewed and updated as appropriate.   Please see review of systems for further details on the patient's review from today.   Objective:   Physical Exam:  Pulse 70   Physical Exam Neurological:     Mental Status: He is alert and oriented to person, place, and time.     Cranial Nerves: No dysarthria.  Psychiatric:        Attention and Perception: Attention and perception normal.        Mood and Affect: Mood normal.  Speech: Speech normal.        Behavior: Behavior is cooperative.        Thought Content: Thought content normal. Thought content is not paranoid or delusional. Thought content does not include homicidal or suicidal ideation. Thought  content does not include homicidal or suicidal plan.        Cognition and Memory: Cognition and memory normal.        Judgment: Judgment normal.     Comments: Insight intact    Lab Review:     Component Value Date/Time   NA 139 02/14/2021 0748   K 4.5 02/14/2021 0748   CL 99 02/14/2021 0748   CO2 30 02/14/2021 0748   GLUCOSE 150 (H) 02/14/2021 0748   BUN 35 (H) 02/14/2021 0748   CREATININE 1.73 (H) 02/14/2021 0748   CALCIUM 9.7 02/14/2021 0748   PROT 6.4 (L) 09/26/2020 0713   ALBUMIN 3.5 09/26/2020 0713   AST 16 09/26/2020 0713   ALT 24 09/26/2020 0713   ALKPHOS 36 (L) 09/26/2020 0713   BILITOT 0.7 09/26/2020 0713   GFRNONAA 58 (L) 09/28/2020 0456   GFRAA 31 (L) 10/20/2019 1419       Component Value Date/Time   WBC 11.0 (H) 09/28/2020 0456   RBC 4.69 09/28/2020 0456   HGB 14.0 09/28/2020 0456   HCT 46.2 09/28/2020 0456   PLT 240 09/28/2020 0456   MCV 98.5 09/28/2020 0456   MCH 29.9 09/28/2020 0456   MCHC 30.3 09/28/2020 0456   RDW 13.4 09/28/2020 0456   LYMPHSABS 2.7 10/07/2019 1055   MONOABS 1.0 10/07/2019 1055   EOSABS 0.3 10/07/2019 1055   BASOSABS 0.2 (H) 10/07/2019 1055    No results found for: POCLITH, LITHIUM   No results found for: PHENYTOIN, PHENOBARB, VALPROATE, CBMZ   .res Assessment: Plan:   Pt seen for 30 minutes and time spent reviewing changes in medical and social hx since last visit on 08/24/20. Agree that mood and anxiety s/s seem to be better controlled despite some psychosocial stressors to include the loss of his mother and assisting brother with health issues.  Discussed that Buspar is typically taken routinely to prevent anxiety, however a small number of people experience an immediate improvement in anxiety after taking Buspar. Discussed that he can continue to take Buspar prn anxiety/insomnia since he reports that Buspar helps immediately with anxiety and insomnia.   Continue Wellbutrin XL 300 mg po qd for depression.  Continue Klonopin 0.5  mg po BID prn anxiety.  Pt to follow-up in 6 months or sooner if clinically indicated.  Patient advised to contact office with any questions, adverse effects, or acute worsening in signs and symptoms. Recommend continuing therapy with Rinaldo Cloud, LCSW.   Sergio Griffin was seen today for anxiety and depression.  Diagnoses and all orders for this visit:  Panic disorder -     clonazePAM (KLONOPIN) 0.5 MG tablet; Take 1 tablet (0.5 mg total) by mouth 2 (two) times daily as needed. for anxiety -     busPIRone (BUSPAR) 15 MG tablet; Take 2/3-1 tab po QHS  Moderate episode of recurrent major depressive disorder (HCC) -     buPROPion (WELLBUTRIN XL) 300 MG 24 hr tablet; Take 1 tablet (300 mg total) by mouth daily.  Generalized anxiety disorder -     busPIRone (BUSPAR) 15 MG tablet; Take 2/3-1 tab po QHS    Please see After Visit Summary for patient specific instructions.  Future Appointments  Date Time Provider [place]  06/08/2021 12:00 PM Shanon Ace, LCSW [facility] None    No orders of the defined types were placed in this encounter.     -------------------------------

## 2021-06-08 ENCOUNTER — Ambulatory Visit: Payer: Medicare Other | Admitting: Psychiatry

## 2021-06-21 DIAGNOSIS — E291 Testicular hypofunction: Secondary | ICD-10-CM | POA: Insufficient documentation

## 2021-06-30 ENCOUNTER — Telehealth: Payer: Self-pay | Admitting: Gastroenterology

## 2021-06-30 ENCOUNTER — Other Ambulatory Visit: Payer: Self-pay

## 2021-06-30 MED ORDER — SORBITOL SOLN: 30.0000 mL | Freq: Every day | 1 refills | Status: DC | PRN

## 2021-06-30 NOTE — Telephone Encounter (Signed)
Patient instructed and pleased with this plan. Rx to his pharmacy. Also provided written information via My Chart.

## 2021-06-30 NOTE — Telephone Encounter (Signed)
Doc of the Day Patient of Dr Silverio Decamp with morbid obesity, cardiac issues and chronic constipation. He is calling for recommendations on his present bout with constipation. Takes daily Miralax and Amitiza. He tells me has has done well and titrate the Miralax to his need. Recently he began having less and less stool passage and is now feeling constipated. Feels incomplete emptying. Passing hard stool. He has done a Miralax purge. He is taking daily Doculax liquid (magnesium hydroxide) and prune juice in addition to his usual. He recently had an increase in his Lasix and thinks this contributed to this problem. Please advise. Thanks

## 2021-06-30 NOTE — Telephone Encounter (Signed)
DOD  Chart reviewed  Patient with chronic constipation.  He is also on amlodipine  Plan: -Must take Amitiza 24 mcg p.o. twice daily with meals -MiraLAX 17 g p.o. twice daily -Add sorbitol 30 cc p.o. once a day.  Please call in prescription -Minimize use of magnesium (since he does have CRI) -If still with constipation, he needs to get in touch with PCP to switch amlodipine to some other antihypertensive class. -If still with problems in 2 weeks, needs appt in APP clinic  RG

## 2021-09-13 ENCOUNTER — Telehealth: Payer: Self-pay | Admitting: Gastroenterology

## 2021-09-13 NOTE — Telephone Encounter (Signed)
Inbound call from pt requesting a call back wanting to know if he needed an Endo as well as colonoscopy before scheduling. Please advise. Thank you.

## 2021-09-13 NOTE — Telephone Encounter (Signed)
I only see a recall for the colonoscopy this December. Please advise.

## 2021-09-13 NOTE — Telephone Encounter (Signed)
Anabel Halon Thank you for talking with the patient. I will be out for the next 2 days.  Please call him and tell him Dr Silverio Decamp wants to see him before scheduling the colonoscopy. It is because of the medications he is on. He does ot need an EGD (endoscopy) if he is not having any issues. If he is having issues, when he comes for the appointment he and she will decide if the EGD will be done. Thank you.

## 2021-09-13 NOTE — Telephone Encounter (Signed)
If he is not having any active upper GI issues or recurrent dysphagia, he doesn't need EGD at this point. Please schedule office visit next available appointment. Thanks

## 2021-09-14 NOTE — Telephone Encounter (Signed)
Good Morning...   You're welcome Beth!!! This pt has been scheduled to see Dr. Silverio Decamp 11/22/21 at 11:00am. Thank you.

## 2021-11-22 ENCOUNTER — Encounter: Payer: Self-pay | Admitting: Gastroenterology

## 2021-11-22 ENCOUNTER — Ambulatory Visit (INDEPENDENT_AMBULATORY_CARE_PROVIDER_SITE_OTHER): Payer: Medicare Other | Admitting: Gastroenterology

## 2021-11-22 VITALS — BP 124/68 | HR 56

## 2021-11-22 DIAGNOSIS — Z86010 Personal history of colonic polyps: Secondary | ICD-10-CM | POA: Diagnosis not present

## 2021-11-22 DIAGNOSIS — R1319 Other dysphagia: Secondary | ICD-10-CM

## 2021-11-22 DIAGNOSIS — K219 Gastro-esophageal reflux disease without esophagitis: Secondary | ICD-10-CM | POA: Diagnosis not present

## 2021-11-22 DIAGNOSIS — K224 Dyskinesia of esophagus: Secondary | ICD-10-CM

## 2021-11-22 DIAGNOSIS — B354 Tinea corporis: Secondary | ICD-10-CM

## 2021-11-22 MED ORDER — PANTOPRAZOLE SODIUM 40 MG PO TBEC: 40.0000 mg | DELAYED_RELEASE_TABLET | Freq: Every day | ORAL | 3 refills | Status: DC

## 2021-11-22 MED ORDER — MICONAZOLE NITRATE 2 % EX CREA: 1.0000 "application " | TOPICAL_CREAM | Freq: Two times a day (BID) | CUTANEOUS | 0 refills | Status: DC

## 2021-11-22 NOTE — Progress Notes (Signed)
Sergio Griffin    [id number]    08-Sep-1951  Primary Care Physician:Caplan, Legrand Como, MD  Referring Physician: Moshe Cipro, MD [address],  [place] [address]   Chief complaint: GERD, history of colon polyps  HPI:  71 year old very pleasant gentleman with history of morbid obesity, CAD s/p PCI, obstructive sleep apnea here for follow-up visit for GERD and history of colon polyps  He denies any rectal bleeding or melena.  His bowel habits have overall improved, he is having regular daily bowel movement since he started eating not.  No abdominal pain.  He is no longer taking Metamucil.  He continues to have intermittent reflux symptoms and regurgitation.  He has some relief with antacids when he has heartburn.   Review of system positive for new skin rash in the left underarm with burning sensation.  He recently tried a new deodorant that was aluminum free, feels he may have had reaction to it.  He called his dermatologist but is unable to see him until 2 weeks.   He had a heart attack in March 2020 while he was still on Brilinta, had a in-stent restenosis, underwent repeat PCI with stent placement and is currently on Prasugrel     Colonoscopy 2012 by Dr. Algis Greenhouse normal with no polyps.  He was recommended to repeat colonoscopy in 5 years due to family history of colon polyps   CT abdomen pelvis September 08, 2018 showed severe diverticulosis involving left side of the colon with no acute diverticulitis, small nonobstructing left renal calculi and mesenteric panniculitis  EGD 10/26/20 - No endoscopic esophageal abnormality to explain patient's dysphagia. Esophagus dilated. Dilated. - Z-line regular, 40 cm from the incisors. - Gastritis. Biopsied. - Normal examined duodenum.  Colonoscopy 10/26/20 - One 25 mm polyp in the proximal ascending colon, removed piecemeal using a hot snare. Resected and retrieved. Clips (MR conditional) were placed. - Ten 4 to  12 mm polyps in the rectum, in the transverse colon, at the hepatic flexure and in the ascending colon, removed with a hot snare. Resected and retrieved. - Moderate diverticulosis in the sigmoid colon, in the descending colon, in the transverse colon and in the ascending colon. There was evidence of diverticular spasm. - Non-bleeding internal hemorrhoids.  1. Surgical [P], gastric antrum, gastric body - GASTRIC ANTRAL MUCOSA WITH MILD REACTIVE GASTROPATHY. - GASTRIC OXYNTIC MUCOSA WITH MILD CHRONIC GASTRITIS. - WARTHIN-STARRY STAIN IS NEGATIVE FOR HELICOBACTER PYLORI. 2. Surgical [P], colon, ascending-7, hepatic flexure-3, polyps (10) - TUBULAR ADENOMA (X MULTIPLE). - NEGATIVE FOR HIGH GRADE DYSPLASIA. 3. Surgical [P], colon, rectal - HYPERPLASTIC POLYP.    Outpatient Encounter Medications as of 11/22/2021  Medication Sig   acetaminophen (TYLENOL) 500 MG tablet Take 500-1,000 mg by mouth every 6 (six) hours as needed for moderate pain or headache.   amLODipine (NORVASC) 5 MG tablet Take 5 mg by mouth daily.   ammonium lactate (AMLACTIN) 12 % cream Apply topically 2 (two) times daily.   amoxicillin-clavulanate (AUGMENTIN) 875-125 MG tablet Take 1 tablet by mouth 2 (two) times daily. (Patient not taking: Reported on 05/31/2021)   aspirin EC 81 MG tablet Take 81 mg by mouth at bedtime.    buPROPion (WELLBUTRIN XL) 300 MG 24 hr tablet Take 1 tablet (300 mg total) by mouth daily.   busPIRone (BUSPAR) 15 MG tablet Take 2/3-1 tab po QHS   Carboxymethylcellul-Glycerin (LUBRICATING EYE DROPS OP) Place 1 drop into both eyes  daily as needed (dry eyes).   clonazePAM (KLONOPIN) 0.5 MG tablet Take 1 tablet (0.5 mg total) by mouth 2 (two) times daily as needed. for anxiety   DM-APAP-CPM (CORICIDIN HBP PO) Take 1 tablet by mouth daily as needed (severe allergies).   furosemide (LASIX) 20 MG tablet Take by mouth. Taking 40 mg po q am and 20 mg at bedtime   ketoconazole (NIZORAL) 2 % shampoo Apply  topically.   latanoprost (XALATAN) 0.005 % ophthalmic solution Place 1 drop into both eyes at bedtime.   losartan (COZAAR) 50 MG tablet Take 100 mg by mouth daily.    lubiprostone (AMITIZA) 24 MCG capsule Take 1 capsule (24 mcg total) by mouth 2 (two) times daily with a meal. (Patient not taking: Reported on 10/26/2020)   metoprolol succinate (TOPROL-XL) 25 MG 24 hr tablet Take 12.5-25 mg by mouth See admin instructions. Take 25 mg in the morning and 12.5 mg in the evening   nitroGLYCERIN (NITROSTAT) 0.4 MG SL tablet Place 1 tablet (0.4 mg total) under the tongue every 5 (five) minutes x 3 doses as needed for chest pain. (Patient not taking: Reported on 10/26/2020)   Omega-3 Fatty Acids (FISH OIL) 1000 MG CAPS Take 1,000 mg by mouth daily.   ondansetron (ZOFRAN) 4 MG tablet Take 4 mg by mouth every 8 (eight) hours as needed for nausea or vomiting.   ondansetron (ZOFRAN-ODT) 4 MG disintegrating tablet DISSOLVE 1 TABLET IN MOUTH EVERY 8 HOURS AS NEEDED FOR NAUSEA FOR VOMITING (Patient taking differently: Take 4 mg by mouth every 8 (eight) hours as needed. )   prasugrel (EFFIENT) 10 MG TABS tablet Take 1 tablet (10 mg total) by mouth at bedtime. Resume 48 hours after surgery   Sorbitol SOLN Take 30 mLs by mouth daily as needed. For constipation   sucralfate (CARAFATE) 1 g tablet Take 1 tablet (1 g total) by mouth at bedtime.   No facility-administered encounter medications on file as of 11/22/2021.    Allergies as of 11/22/2021 - Review Complete 05/31/2021  Allergen Reaction Noted   Tamsulosin Other (See Comments) 09/24/2020   Ciprofloxacin Itching 09/24/2020   Ibuprofen Diarrhea and Nausea Only 10/20/2019   Propoxyphene Itching 10/20/2019   Sulfa antibiotics Itching 06/10/2015    Past Medical History:  Diagnosis Date   Anxiety    Barrett's esophagus    CAD (coronary artery disease)    PCI, stent 2011   Cardiac arrhythmia    Chicken pox    Depression    Diverticulitis    Diverticulosis     Gastric ulcer    GERD (gastroesophageal reflux disease)    Glaucoma    Heart attack ([place]) 01/29/2019   History of myocardial infarction    Hyperlipidemia    Hypertension    Kidney stones    Kidney stones    Pneumonia 11/2014   Prostatitis    Reflux    Sleep apnea    UTI (lower urinary tract infection)     Past Surgical History:  Procedure Laterality Date   cardiac stents     last one 01/29/2019   CYSTOSCOPY/URETEROSCOPY/HOLMIUM LASER/STENT PLACEMENT Left 10/22/2019   Procedure: CYSTOSCOPY/URETEROSCOPY/HOLMIUM LASER/STENT PLACEMENT;  Surgeon: Ardis Hughs, MD;  Location: [facility];  Service: Urology;  Laterality: Left;   ESOPHAGEAL MANOMETRY N/A 09/11/2017   Procedure: ESOPHAGEAL MANOMETRY (EM);  Surgeon: Mauri Pole, MD;  Location: [facility];  Service: Endoscopy;  Laterality: N/A;   EYE SURGERY Left    x 4   EYE  SURGERY Right    x 3    Family History  Problem Relation Age of Onset   Arthritis Mother    Hyperlipidemia Mother    Stroke Mother    Hypertension Mother    Ovarian cancer Mother    Hyperlipidemia Father    Heart disease Father    Hypertension Father    Non-Hodgkin's lymphoma Father    Hyperlipidemia Maternal Grandmother    Heart disease Maternal Grandmother    Stroke Maternal Grandmother    Hypertension Maternal Grandmother    Diabetes Maternal Grandmother    Hyperlipidemia Maternal Grandfather    Heart disease Maternal Grandfather    Hypertension Maternal Grandfather    Heart disease Paternal Grandmother    Heart disease Paternal Grandfather    Stroke Paternal Grandfather    Hypertension Paternal Grandfather    Congestive Heart Failure Brother    Colon cancer Neg Hx    Esophageal cancer Neg Hx    Pancreatic cancer Neg Hx    Stomach cancer Neg Hx    Liver disease Neg Hx     Social History   Socioeconomic History   Marital status: Divorced    Spouse name: Not on file   Number of children: 2   Years of education: 16   Highest  education level: Not on file  Occupational History   Occupation: Estate manager/land agent    Comment: Retired  Tobacco Use   Smoking status: Former    Types: Cigarettes   Smokeless tobacco: Never   Tobacco comments:    quit 1987  Vaping Use   Vaping Use: Never used  Substance and Sexual Activity   Alcohol use: No   Drug use: No   Sexual activity: Not Currently    Partners: Female  Other Topics Concern   Not on file  Social History Narrative   Fun: Paint, cook, travel   Denies religious beliefs effecting health care.    Social Determinants of Health   Financial Resource Strain: Not on file  Food Insecurity: Not on file  Transportation Needs: Not on file  Physical Activity: Not on file  Stress: Not on file  Social Connections: Not on file  Intimate Partner Violence: Not on file      Review of systems: All other review of systems negative except as mentioned in the HPI.   Physical Exam: Vitals:   11/22/21 1113  BP: 124/68  Pulse: (!) 56   Gen:      No acute distress, morbidly obese in wheelchair HEENT:  sclera anicteric Neuro: alert and oriented x 3 Psych: normal mood and affect Erythematous skin rash in the left>> right underarm Data Reviewed:  Reviewed labs, radiology imaging, old records and pertinent past GI work up   Assessment and Plan/Recommendations:  71 year old very pleasant gentleman with history of morbid obesity, CAD s/p PCI on antiplatelet therapy, history of diverticulitis and multiple adenomatous colon polyps  Currently his bowel habits are regular with no abdominal pain or rectal bleeding. Discussed with patient in detail the potential risks associated with anesthesia and procedure given his comorbid conditions and agreed to hold off doing preventative surveillance colonoscopy going forward as potential risks outweigh the benefits  GERD and esophageal spasms: Use pantoprazole 40 mg daily, 30 minutes before breakfast Small meals and avoid  drinking cold beverages Use Gaviscon 1 tablet up to 3 times daily after meals as needed for breakthrough heartburn Antireflux measures  Left underarm skin rash erythematous consistent with tinea, cannot exclude allergic contact dermatitis  Advised patient to stop using the new deodorant Miconazole 2% cream small pea-sized amount twice daily for 4 to 6 weeks Use hydrocortisone 1% cream twice daily for symptom relief for 2 weeks. Follow-up with dermatology  Return in 6 months or sooner if needed  This visit required >40 minutes of patient care (this includes precharting, chart review, review of results, face-to-face time used for counseling as well as treatment plan and follow-up. The patient was provided an opportunity to ask questions and all were answered. The patient agreed with the plan and demonstrated an understanding of the instructions.  Damaris Hippo , MD    CC: Moshe Cipro, MD

## 2021-11-22 NOTE — Patient Instructions (Addendum)
Take Gaviscon tablets three times a day after meals as needed  We will send Pantoprazole to your pharmacy  We will send Miconazole 2% cream and hydrocortisone 1 % twice a day for 4-6 weeks  If you are age 72 or older, your body mass index should be between 23-30. Your There is no height or weight on file to calculate BMI. If this is out of the aforementioned range listed, please consider follow up with your Primary Care Provider.  If you are age 76 or younger, your body mass index should be between 19-25. Your There is no height or weight on file to calculate BMI. If this is out of the aformentioned range listed, please consider follow up with your Primary Care Provider.   ________________________________________________________  The [facility] providers would like to encourage you to use [facility] to communicate with providers for non-urgent requests or questions.  Due to long hold times on the telephone, sending your provider a message by [facility] may be a faster and more efficient way to get a response.  Please allow 48 business hours for a response.  Please remember that this is for non-urgent requests.  _______________________________________________________   Thank you for choosing [facility]  Kavitha Nandigam,MD   Follow up in 6 months  I

## 2021-12-15 ENCOUNTER — Encounter: Payer: Self-pay | Admitting: Psychiatry

## 2021-12-15 ENCOUNTER — Ambulatory Visit (INDEPENDENT_AMBULATORY_CARE_PROVIDER_SITE_OTHER): Payer: Medicare Other | Admitting: Psychiatry

## 2021-12-15 DIAGNOSIS — F331 Major depressive disorder, recurrent, moderate: Secondary | ICD-10-CM

## 2021-12-15 DIAGNOSIS — F410 Panic disorder [episodic paroxysmal anxiety] without agoraphobia: Secondary | ICD-10-CM

## 2021-12-15 MED ORDER — BUPROPION HCL ER (XL) 300 MG PO TB24: 300.0000 mg | ORAL_TABLET | Freq: Every day | ORAL | 1 refills | Status: DC

## 2021-12-15 MED ORDER — CLONAZEPAM 0.5 MG PO TABS: 0.5000 mg | ORAL_TABLET | Freq: Two times a day (BID) | ORAL | 1 refills | Status: DC | PRN

## 2021-12-15 NOTE — Progress Notes (Signed)
Sergio Griffin [id number] [birth year] 71 y.o.  Virtual Visit via Telephone Note  I connected with pt on 12/15/21 at 10:00 AM EST by telephone and verified that I am speaking with the correct person using two identifiers.   I discussed the limitations, risks, security and privacy concerns of performing an evaluation and management service by telephone and the availability of in person appointments. I also discussed with the patient that there may be a patient responsible charge related to this service. The patient expressed understanding and agreed to proceed.   I discussed the assessment and treatment plan with the patient. The patient was provided an opportunity to ask questions and all were answered. The patient agreed with the plan and demonstrated an understanding of the instructions.   The patient was advised to call back or seek an in-person evaluation if the symptoms worsen or if the condition fails to improve as anticipated.  I provided 30 minutes of non-face-to-face time during this encounter.  The patient was located at home.  The provider was located at [place].   Thayer Headings, PMHNP   Subjective:   Patient ID:  Sergio Griffin is a 71 y.o. (DOB Jul 08, 1951) male.  Chief Complaint:  Chief Complaint  Patient presents with   Anxiety   Depression    HPI Sergio Griffin presents for follow-up of anxiety, depression, and insomnia. He reports that he had a period in the summer where "things were a little better" and then had a rougher time in the fall. He reports that he has had difficulty dealing with his brother "and I am slowly learning how to deal with him." He has been trying to move in to help brother who has heart failure, diabetes, and other health issues. He reports, "I don't want to live with him." He has been staying part of the time with brother and part of the time in his apartment. He reports that he is occasionally "tensing up again" and had a severe  tension HA yesterday. Has been trying to meditating some. He reports that he has experienced some depression. He reports, "I try to focus on the good things... I try to stay positive." Energy and motivation have been somewhat lower. He reports that he has to do "work in spurts" with breaks. He reports that his sleep is "broken." He reports that Klonopin is helpful for his insomnia when he has had difficulty with sleep. Appetite has been ok. He is eating 2 meals a day. He reports that he has been losing some weight. "I have a lot to live for. I have a lot to be thankful for." Denies SI or HI.   Reports that mother did not have a will.   Past Psychiatric Medication Trials: Prozac- Adverse effects Wellbutrin- helpful for depression Celexa- Affective dulling Lexapro Imipramine- Helpful for anxiety, worsened glaucoma Klonopin- effective Ambien-ineffective.  Buspar  Review of Systems:  Review of Systems  HENT:  Positive for postnasal drip and voice change.   Musculoskeletal:  Negative for gait problem.  Psychiatric/Behavioral:         Please refer to HPI   Medications: I have reviewed the patient's current medications.  Current Outpatient Medications  Medication Sig Dispense Refill   acetaminophen (TYLENOL) 500 MG tablet Take 500-1,000 mg by mouth every 6 (six) hours as needed for moderate pain or headache.     amLODipine (NORVASC) 5 MG tablet Take 5 mg by mouth daily.     aspirin EC 81 MG tablet  Take 81 mg by mouth at bedtime.      Carboxymethylcellul-Glycerin (LUBRICATING EYE DROPS OP) Place 1 drop into both eyes daily as needed (dry eyes).     DM-APAP-CPM (CORICIDIN HBP PO) Take 1 tablet by mouth daily as needed (severe allergies).     furosemide (LASIX) 20 MG tablet Take 20 mg by mouth daily.     ketoconazole (NIZORAL) 2 % shampoo Apply topically.     latanoprost (XALATAN) 0.005 % ophthalmic solution Place 1 drop into both eyes at bedtime.     losartan (COZAAR) 50 MG tablet Take 100 mg  by mouth daily.      metoprolol succinate (TOPROL-XL) 25 MG 24 hr tablet Take 12.5-25 mg by mouth See admin instructions. Take 25 mg in the morning and 12.5 mg in the evening     Omega-3 Fatty Acids (FISH OIL) 1000 MG CAPS Take 1,000 mg by mouth daily.     pantoprazole (PROTONIX) 40 MG tablet Take 1 tablet (40 mg total) by mouth daily. 90 tablet 3   prasugrel (EFFIENT) 10 MG TABS tablet Take 1 tablet (10 mg total) by mouth at bedtime. Resume 48 hours after surgery 30 tablet    Sorbitol SOLN Take 30 mLs by mouth daily as needed. For constipation 473 mL 1   sucralfate (CARAFATE) 1 g tablet Take 1 tablet (1 g total) by mouth at bedtime.     ammonium lactate (AMLACTIN) 12 % cream Apply topically 2 (two) times daily.     buPROPion (WELLBUTRIN XL) 300 MG 24 hr tablet Take 1 tablet (300 mg total) by mouth daily. 90 tablet 1   clonazePAM (KLONOPIN) 0.5 MG tablet Take 1 tablet (0.5 mg total) by mouth 2 (two) times daily as needed. for anxiety 180 tablet 1   miconazole (MICONAZOLE ANTIFUNGAL) 2 % cream Apply 1 application topically 2 (two) times daily. For 4-6 weeks 28.35 g 0   nitroGLYCERIN (NITROSTAT) 0.4 MG SL tablet Place 1 tablet (0.4 mg total) under the tongue every 5 (five) minutes x 3 doses as needed for chest pain. 30 tablet 1   ofloxacin (OCUFLOX) 0.3 % ophthalmic solution Place 1 drop into the right eye 3 (three) times daily.     ondansetron (ZOFRAN) 4 MG tablet Take 4 mg by mouth every 8 (eight) hours as needed for nausea or vomiting.     ondansetron (ZOFRAN-ODT) 4 MG disintegrating tablet DISSOLVE 1 TABLET IN MOUTH EVERY 8 HOURS AS NEEDED FOR NAUSEA FOR VOMITING (Patient taking differently: Take 4 mg by mouth every 8 (eight) hours as needed.) 30 tablet 0   No current facility-administered medications for this visit.    Medication Side Effects: None  Allergies:  Allergies  Allergen Reactions   Tamsulosin Other (See Comments)    Light headed and pass out   Ciprofloxacin Itching    Ibuprofen Diarrhea and Nausea Only   Propoxyphene Itching    Darvocet   Sulfa Antibiotics Itching    Past Medical History:  Diagnosis Date   Anxiety    Barrett's esophagus    CAD (coronary artery disease)    PCI, stent 2011   Cardiac arrhythmia    Chicken pox    Depression    Diverticulitis    Diverticulosis    Gastric ulcer    GERD (gastroesophageal reflux disease)    Glaucoma    Heart attack ([place]) 01/29/2019   History of myocardial infarction    Hyperlipidemia    Hypertension    Kidney stones  Kidney stones    Pneumonia 11/2014   Prostatitis    Reflux    Sleep apnea    UTI (lower urinary tract infection)     Family History  Problem Relation Age of Onset   Arthritis Mother    Hyperlipidemia Mother    Stroke Mother    Hypertension Mother    Ovarian cancer Mother    Hyperlipidemia Father    Heart disease Father    Hypertension Father    Non-Hodgkin's lymphoma Father    Hyperlipidemia Maternal Grandmother    Heart disease Maternal Grandmother    Stroke Maternal Grandmother    Hypertension Maternal Grandmother    Diabetes Maternal Grandmother    Hyperlipidemia Maternal Grandfather    Heart disease Maternal Grandfather    Hypertension Maternal Grandfather    Heart disease Paternal Grandmother    Heart disease Paternal Grandfather    Stroke Paternal Grandfather    Hypertension Paternal Grandfather    Congestive Heart Failure Brother    Colon cancer Neg Hx    Esophageal cancer Neg Hx    Pancreatic cancer Neg Hx    Stomach cancer Neg Hx    Liver disease Neg Hx     Social History   Socioeconomic History   Marital status: Divorced    Spouse name: Not on file   Number of children: 2   Years of education: 16   Highest education level: Not on file  Occupational History   Occupation: Estate manager/land agent    Comment: Retired  Tobacco Use   Smoking status: Former    Types: Cigarettes   Smokeless tobacco: Never   Tobacco comments:    quit 1987  Vaping  Use   Vaping Use: Never used  Substance and Sexual Activity   Alcohol use: No   Drug use: No   Sexual activity: Not Currently    Partners: Female  Other Topics Concern   Not on file  Social History Narrative   Fun: Paint, cook, travel   Denies religious beliefs effecting health care.    Social Determinants of Health   Financial Resource Strain: Not on file  Food Insecurity: Not on file  Transportation Needs: Not on file  Physical Activity: Not on file  Stress: Not on file  Social Connections: Not on file  Intimate Partner Violence: Not on file    Past Medical History, Surgical history, Social history, and Family history were reviewed and updated as appropriate.   Please see review of systems for further details on the patient's review from today.   Objective:   Physical Exam:  There were no vitals taken for this visit.  Physical Exam Neurological:     Mental Status: He is alert and oriented to person, place, and time.     Cranial Nerves: No dysarthria.  Psychiatric:        Attention and Perception: Attention and perception normal.        Speech: Speech normal.        Behavior: Behavior is cooperative.        Thought Content: Thought content normal. Thought content is not paranoid or delusional. Thought content does not include homicidal or suicidal ideation. Thought content does not include homicidal or suicidal plan.        Cognition and Memory: Cognition and memory normal.        Judgment: Judgment normal.     Comments: Insight intact Mood is sad and anxious in response to family stressors    Lab Review:  Component Value Date/Time   NA 139 02/14/2021 0748   K 4.5 02/14/2021 0748   CL 99 02/14/2021 0748   CO2 30 02/14/2021 0748   GLUCOSE 150 (H) 02/14/2021 0748   BUN 35 (H) 02/14/2021 0748   CREATININE 1.73 (H) 02/14/2021 0748   CALCIUM 9.7 02/14/2021 0748   PROT 6.4 (L) 09/26/2020 0713   ALBUMIN 3.5 09/26/2020 0713   AST 16 09/26/2020 0713   ALT 24  09/26/2020 0713   ALKPHOS 36 (L) 09/26/2020 0713   BILITOT 0.7 09/26/2020 0713   GFRNONAA 58 (L) 09/28/2020 0456   GFRAA 31 (L) 10/20/2019 1419       Component Value Date/Time   WBC 11.0 (H) 09/28/2020 0456   RBC 4.69 09/28/2020 0456   HGB 14.0 09/28/2020 0456   HCT 46.2 09/28/2020 0456   PLT 240 09/28/2020 0456   MCV 98.5 09/28/2020 0456   MCH 29.9 09/28/2020 0456   MCHC 30.3 09/28/2020 0456   RDW 13.4 09/28/2020 0456   LYMPHSABS 2.7 10/07/2019 1055   MONOABS 1.0 10/07/2019 1055   EOSABS 0.3 10/07/2019 1055   BASOSABS 0.2 (H) 10/07/2019 1055    No results found for: POCLITH, LITHIUM   No results found for: PHENYTOIN, PHENOBARB, VALPROATE, CBMZ   .res Assessment: Plan:   Pt seen for 30 minutes and time spent reviewing recent symptoms and stressors, as well as reviewing treatment plan. He reports that he would like to continue current medications without changes.  Continue Wellbutrin XL 300 mg po qd for depression.  Continue Klonopin 0.5 mg po BID prn anxiety.  Pt to follow-up in 6 months or sooner if clinically indicated.  Patient advised to contact office with any questions, adverse effects, or acute worsening in signs and symptoms.   Sergio Griffin was seen today for anxiety and depression.  Diagnoses and all orders for this visit:  Moderate episode of recurrent major depressive disorder (HCC) -     buPROPion (WELLBUTRIN XL) 300 MG 24 hr tablet; Take 1 tablet (300 mg total) by mouth daily.  Panic disorder -     clonazePAM (KLONOPIN) 0.5 MG tablet; Take 1 tablet (0.5 mg total) by mouth 2 (two) times daily as needed. for anxiety    Please see After Visit Summary for patient specific instructions.  No future appointments.  No orders of the defined types were placed in this encounter.     -------------------------------

## 2022-01-09 ENCOUNTER — Other Ambulatory Visit: Payer: Self-pay | Admitting: Gastroenterology

## 2022-01-24 ENCOUNTER — Telehealth: Payer: Self-pay | Admitting: Gastroenterology

## 2022-01-24 NOTE — Telephone Encounter (Signed)
Called patient back. No answer. Left a message on his voicemail. ?

## 2022-01-24 NOTE — Telephone Encounter (Signed)
Inbound call from patient states he have begin feeling like a diverticulitis flare. Is experiencing lower left abd pain and constipation that began Friday 3/3 ?

## 2022-01-26 NOTE — Telephone Encounter (Signed)
Patient contacted. Last weekend he began having abdominal discomfort and feeling as if he was constipated. He would have a bowel movement and still feel he was constipated. He now hurts across his lower abdomen from left to right. He took Miralax yesterday afternoon. Reports no results from the Miralax. He has not had a bowel movement today. He feels nauseated as well. Afebrile. Patient also has laryngitis presently.  ?Please advise. ?

## 2022-01-26 NOTE — Telephone Encounter (Signed)
Please advise patient to use glycerine suppository every 2 hours X2  this afternoon, if no improvement then try Dulcolax suppository tonight followed by Miralax bowel purge tomorrow AM.  ?

## 2022-01-26 NOTE — Telephone Encounter (Signed)
Spoke with patient.  ?Explained the plan of care. Questions invited and answered. Follow up next week if not improved. ?

## 2022-01-26 NOTE — Telephone Encounter (Signed)
Patient returned call

## 2022-01-29 NOTE — Telephone Encounter (Signed)
Called to check on the patient. No answer. Left a message of my return call. ?

## 2022-04-18 ENCOUNTER — Other Ambulatory Visit: Payer: Self-pay | Admitting: Psychiatry

## 2022-04-18 DIAGNOSIS — F410 Panic disorder [episodic paroxysmal anxiety] without agoraphobia: Secondary | ICD-10-CM

## 2022-04-18 DIAGNOSIS — F411 Generalized anxiety disorder: Secondary | ICD-10-CM

## 2022-05-31 ENCOUNTER — Other Ambulatory Visit: Payer: Self-pay | Admitting: Psychiatry

## 2022-05-31 DIAGNOSIS — F331 Major depressive disorder, recurrent, moderate: Secondary | ICD-10-CM

## 2022-05-31 NOTE — Telephone Encounter (Signed)
Please call to schedule F/U. Due this month.

## 2022-05-31 NOTE — Telephone Encounter (Signed)
Pt has ana pt 7/17

## 2022-06-04 ENCOUNTER — Encounter: Payer: Self-pay | Admitting: Psychiatry

## 2022-06-04 ENCOUNTER — Telehealth (INDEPENDENT_AMBULATORY_CARE_PROVIDER_SITE_OTHER): Payer: Medicare Other | Admitting: Psychiatry

## 2022-06-04 DIAGNOSIS — F3341 Major depressive disorder, recurrent, in partial remission: Secondary | ICD-10-CM

## 2022-06-04 DIAGNOSIS — F410 Panic disorder [episodic paroxysmal anxiety] without agoraphobia: Secondary | ICD-10-CM

## 2022-06-04 MED ORDER — BUPROPION HCL ER (XL) 300 MG PO TB24: 300.0000 mg | ORAL_TABLET | Freq: Every day | ORAL | 1 refills | Status: DC

## 2022-06-04 MED ORDER — CLONAZEPAM 0.5 MG PO TABS: 0.5000 mg | ORAL_TABLET | Freq: Two times a day (BID) | ORAL | 0 refills | Status: DC | PRN

## 2022-06-04 NOTE — Progress Notes (Signed)
Sergio Griffin [id number] 04/12/1951 71 y.o.  Virtual Visit via Video Note  I connected with pt @ on 06/04/22 at  9:30 AM EDT by a video enabled telemedicine application and verified that I am speaking with the correct person using two identifiers.   I discussed the limitations of evaluation and management by telemedicine and the availability of in person appointments. The patient expressed understanding and agreed to proceed.  I discussed the assessment and treatment plan with the patient. The patient was provided an opportunity to ask questions and all were answered. The patient agreed with the plan and demonstrated an understanding of the instructions.   The patient was advised to call back or seek an in-person evaluation if the symptoms worsen or if the condition fails to improve as anticipated.  I provided 32 minutes of non-face-to-face time during this encounter.  The patient was located at home.  The provider was located at [place].   Thayer Headings, PMHNP   Subjective:   Patient ID:  Sergio Griffin is a 71 y.o. (DOB [birth year]) male.  Chief Complaint:  Chief Complaint  Patient presents with   Follow-up    Depression, anxiety, and insomnia    HPI Sergio Griffin presents for follow-up of depression, anxiety, and insomnia. He reports that his mood varies. He has moved out of his apartment and is fully moved into his parents' home. He had to hire some people to help him move and this became stressful. He reports some frustration in response to not being able to find belongings in his house. He reports that his anxiety has "improved." He anticipates his anxiety continuing to improve once he is unpacked and settled into the house. He enjoys having a quiet home. He reports that his sleep has improved and is more restorative. He reports that his energy is low "but I keep trying." He reports motivation is ok and he is trying to take things one day at a time. He has been  avoiding going outside in the heat of the day. He reports that he tries to ignore negative talk or distract himself when this occurs. "I don't go back to the past as much I used to." He is looking forward to doing art work once he finds and unpacks all of his art supplies. He reports improved concentration and is now able to read a few pages and retain what he has read. He reports less distractibility and able to sustain focus for longer. Appetite has been slightly decreased with hot weather. He reports that his weight has decreased some. Denies SI- "that's the last thing on my list... I've got too much to live for... I want to continue to live and learn for as long as I can."  He has been doing some repetitions with small hand weights. He is also trying to walk more (going back and forth to the mailbox).   Brother continues to have health issues- CHF, Afib, and may need pacemaker. Brother is now in LTC facility and he seems to be content there.   Son got married in early March. He reports that son has been less available. Sergio Griffin reports that he has been reaching out to other family members. Went out to lunch last week with some friends from high school. He is contemplating doing volunteer work. Has some contact with neighbors. He has a cat.   He reports taking Klonopin prn about once a day and occasionally "just forget it." He reports his move  he was taking it twice daily. Klonopin last filled 12/15/21.   Past Psychiatric Medication Trials: Prozac- Adverse effects Wellbutrin- helpful for depression Celexa- Affective dulling Lexapro Imipramine- Helpful for anxiety, worsened glaucoma Klonopin- effective Ambien-ineffective.  Buspar  Review of Systems:  Review of Systems  HENT:  Positive for ear pain and sinus pain.   Musculoskeletal:  Negative for gait problem.  Psychiatric/Behavioral:         Please refer to HPI    Medications: I have reviewed the patient's current medications.  Current  Outpatient Medications  Medication Sig Dispense Refill   acetaminophen (TYLENOL) 500 MG tablet Take 500-1,000 mg by mouth every 6 (six) hours as needed for moderate pain or headache.     amLODipine (NORVASC) 5 MG tablet Take 5 mg by mouth daily.     aspirin EC 81 MG tablet Take 81 mg by mouth at bedtime.      Carboxymethylcellul-Glycerin (LUBRICATING EYE DROPS OP) Place 1 drop into both eyes daily as needed (dry eyes).     DM-APAP-CPM (CORICIDIN HBP PO) Take 1 tablet by mouth daily as needed (severe allergies).     furosemide (LASIX) 20 MG tablet Take 20 mg by mouth daily.     latanoprost (XALATAN) 0.005 % ophthalmic solution Place 1 drop into both eyes at bedtime.     losartan (COZAAR) 50 MG tablet Take 100 mg by mouth daily.      metoprolol succinate (TOPROL-XL) 25 MG 24 hr tablet Take 12.5-25 mg by mouth See admin instructions. Take 25 mg in the morning and 12.5 mg in the evening     Multiple Vitamin (MULTIVITAMIN) tablet Take 1 tablet by mouth daily.     Omega-3 Fatty Acids (FISH OIL) 1000 MG CAPS Take 1,000 mg by mouth daily.     ondansetron (ZOFRAN-ODT) 4 MG disintegrating tablet DISSOLVE 1 TABLET IN MOUTH EVERY 8 HOURS AS NEEDED FOR NAUSEA FOR VOMITING 30 tablet 0   pantoprazole (PROTONIX) 40 MG tablet Take 1 tablet (40 mg total) by mouth daily. (Patient taking differently: Take 40 mg by mouth as needed.) 90 tablet 3   prasugrel (EFFIENT) 10 MG TABS tablet Take 1 tablet (10 mg total) by mouth at bedtime. Resume 48 hours after surgery 30 tablet    Sorbitol SOLN Take 30 mLs by mouth daily as needed. For constipation 473 mL 1   sucralfate (CARAFATE) 1 g tablet Take 1 tablet (1 g total) by mouth at bedtime.     ammonium lactate (AMLACTIN) 12 % cream Apply topically 2 (two) times daily.     buPROPion (WELLBUTRIN XL) 300 MG 24 hr tablet Take 1 tablet (300 mg total) by mouth daily. 90 tablet 1   ciprofloxacin-dexamethasone (CIPRODEX) OTIC suspension SMARTSIG:In Ear(s)     clonazePAM (KLONOPIN)  0.5 MG tablet Take 1 tablet (0.5 mg total) by mouth 2 (two) times daily as needed. for anxiety 180 tablet 0   ketoconazole (NIZORAL) 2 % shampoo Apply topically.     miconazole (MICONAZOLE ANTIFUNGAL) 2 % cream Apply 1 application topically 2 (two) times daily. For 4-6 weeks 28.35 g 0   nitroGLYCERIN (NITROSTAT) 0.4 MG SL tablet Place 1 tablet (0.4 mg total) under the tongue every 5 (five) minutes x 3 doses as needed for chest pain. 30 tablet 1   ofloxacin (OCUFLOX) 0.3 % ophthalmic solution Place 1 drop into the right eye 3 (three) times daily. (Patient not taking: Reported on 06/04/2022)     ondansetron (ZOFRAN) 4 MG tablet Take 4 mg  by mouth every 8 (eight) hours as needed for nausea or vomiting.     No current facility-administered medications for this visit.    Medication Side Effects: None  Allergies:  Allergies  Allergen Reactions   Tamsulosin Other (See Comments)    Light headed and pass out   Ciprofloxacin Itching   Ibuprofen Diarrhea and Nausea Only   Propoxyphene Itching    Darvocet   Sulfa Antibiotics Itching    Past Medical History:  Diagnosis Date   Anxiety    Barrett's esophagus    CAD (coronary artery disease)    PCI, stent 2011   Cardiac arrhythmia    Chicken pox    Depression    Diverticulitis    Diverticulosis    Gastric ulcer    GERD (gastroesophageal reflux disease)    Glaucoma    Heart attack ([place]) 01/29/2019   History of myocardial infarction    Hyperlipidemia    Hypertension    Kidney stones    Kidney stones    Pneumonia 11/2014   Prostatitis    Reflux    Sleep apnea    UTI (lower urinary tract infection)     Family History  Problem Relation Age of Onset   Arthritis Mother    Hyperlipidemia Mother    Stroke Mother    Hypertension Mother    Ovarian cancer Mother    Hyperlipidemia Father    Heart disease Father    Hypertension Father    Non-Hodgkin's lymphoma Father    Hyperlipidemia Maternal Grandmother    Heart disease Maternal  Grandmother    Stroke Maternal Grandmother    Hypertension Maternal Grandmother    Diabetes Maternal Grandmother    Hyperlipidemia Maternal Grandfather    Heart disease Maternal Grandfather    Hypertension Maternal Grandfather    Heart disease Paternal Grandmother    Heart disease Paternal Grandfather    Stroke Paternal Grandfather    Hypertension Paternal Grandfather    Congestive Heart Failure Brother    Colon cancer Neg Hx    Esophageal cancer Neg Hx    Pancreatic cancer Neg Hx    Stomach cancer Neg Hx    Liver disease Neg Hx     Social History   Socioeconomic History   Marital status: Divorced    Spouse name: Not on file   Number of children: 2   Years of education: 16   Highest education level: Not on file  Occupational History   Occupation: Estate manager/land agent    Comment: Retired  Tobacco Use   Smoking status: Former    Types: Cigarettes   Smokeless tobacco: Never   Tobacco comments:    quit 1987  Vaping Use   Vaping Use: Never used  Substance and Sexual Activity   Alcohol use: No   Drug use: No   Sexual activity: Not Currently    Partners: Female  Other Topics Concern   Not on file  Social History Narrative   Fun: Paint, cook, travel   Denies religious beliefs effecting health care.    Social Determinants of Health   Financial Resource Strain: Not on file  Food Insecurity: Not on file  Transportation Needs: Not on file  Physical Activity: Not on file  Stress: Not on file  Social Connections: Not on file  Intimate Partner Violence: Not on file    Past Medical History, Surgical history, Social history, and Family history were reviewed and updated as appropriate.   Please see review of systems for further  details on the patient's review from today.   Objective:   Physical Exam:  There were no vitals taken for this visit.  Physical Exam Neurological:     Mental Status: He is alert and oriented to person, place, and time.     Cranial Nerves:  No dysarthria.  Psychiatric:        Attention and Perception: Attention and perception normal.        Mood and Affect: Mood normal.        Speech: Speech normal.        Behavior: Behavior is cooperative.        Thought Content: Thought content normal. Thought content is not paranoid or delusional. Thought content does not include homicidal or suicidal ideation. Thought content does not include homicidal or suicidal plan.        Cognition and Memory: Cognition and memory normal.        Judgment: Judgment normal.     Comments: Insight intact     Lab Review:     Component Value Date/Time   NA 139 02/14/2021 0748   K 4.5 02/14/2021 0748   CL 99 02/14/2021 0748   CO2 30 02/14/2021 0748   GLUCOSE 150 (H) 02/14/2021 0748   BUN 35 (H) 02/14/2021 0748   CREATININE 1.73 (H) 02/14/2021 0748   CALCIUM 9.7 02/14/2021 0748   PROT 6.4 (L) 09/26/2020 0713   ALBUMIN 3.5 09/26/2020 0713   AST 16 09/26/2020 0713   ALT 24 09/26/2020 0713   ALKPHOS 36 (L) 09/26/2020 0713   BILITOT 0.7 09/26/2020 0713   GFRNONAA 58 (L) 09/28/2020 0456   GFRAA 31 (L) 10/20/2019 1419       Component Value Date/Time   WBC 11.0 (H) 09/28/2020 0456   RBC 4.69 09/28/2020 0456   HGB 14.0 09/28/2020 0456   HCT 46.2 09/28/2020 0456   PLT 240 09/28/2020 0456   MCV 98.5 09/28/2020 0456   MCH 29.9 09/28/2020 0456   MCHC 30.3 09/28/2020 0456   RDW 13.4 09/28/2020 0456   LYMPHSABS 2.7 10/07/2019 1055   MONOABS 1.0 10/07/2019 1055   EOSABS 0.3 10/07/2019 1055   BASOSABS 0.2 (H) 10/07/2019 1055    No results found for: "POCLITH", "LITHIUM"   No results found for: "PHENYTOIN", "PHENOBARB", "VALPROATE", "CBMZ"   .res Assessment: Plan:    Pt seen for 32 minutes and time spent reviewing changes in social history and reviewing mood symptoms over the past 6 months since last visit. Agreed that his mood and anxiety symptoms have improved and there has also been some improvement in psychosocial stressors.  Will continue  current plan of care since target signs and symptoms are well controlled without any tolerability issues. Continue Wellbutrin XL 300 mg po qd for depression.  Continue Klonopin 0.5 mg po BID prn anxiety.  Pt to follow-up in 6 months or sooner if clinically indicated.  Patient advised to contact office with any questions, adverse effects, or acute worsening in signs and symptoms.  Sergio Griffin was seen today for follow-up.  Diagnoses and all orders for this visit:  Recurrent major depressive disorder, in partial remission ([place]) -     buPROPion (WELLBUTRIN XL) 300 MG 24 hr tablet; Take 1 tablet (300 mg total) by mouth daily.  Panic disorder -     clonazePAM (KLONOPIN) 0.5 MG tablet; Take 1 tablet (0.5 mg total) by mouth 2 (two) times daily as needed. for anxiety     Please see After Visit Summary for patient specific  instructions.  No future appointments.  No orders of the defined types were placed in this encounter.     -------------------------------

## 2022-10-16 ENCOUNTER — Ambulatory Visit: Payer: Medicare Other | Admitting: Internal Medicine

## 2022-11-06 ENCOUNTER — Telehealth: Payer: Self-pay | Admitting: Gastroenterology

## 2022-11-06 NOTE — Telephone Encounter (Signed)
Patient calling to schedule apt wants to have sooner apt. Does not want to schedule with pa.   Please advise.

## 2022-11-06 NOTE — Telephone Encounter (Signed)
Spoke with the patient. He reports that last Sunday 11/04/22, he became very ill with vomiting and diarrhea. He went to the ED in [place] and was admitted. Diagnosed with SBO. He was evaluated but did not have to have surgery. He was released today. Appointment for follow up has been scheduled. He reports he is to be on a soft low residue diet for a few more days, then advance to soft cooked vegetables. He has made his follow up appointment with his PCP. Does not plan to follow up with the general surgeon because he did not have surgery.

## 2022-11-08 NOTE — Telephone Encounter (Signed)
ok 

## 2022-11-20 ENCOUNTER — Ambulatory Visit: Payer: Medicare Other | Admitting: Internal Medicine

## 2022-11-30 ENCOUNTER — Ambulatory Visit: Payer: Medicare Other | Admitting: Cardiovascular Disease

## 2022-12-05 ENCOUNTER — Encounter: Payer: Self-pay | Admitting: Psychiatry

## 2022-12-05 ENCOUNTER — Ambulatory Visit (INDEPENDENT_AMBULATORY_CARE_PROVIDER_SITE_OTHER): Payer: Medicare Other | Admitting: Psychiatry

## 2022-12-05 DIAGNOSIS — F3341 Major depressive disorder, recurrent, in partial remission: Secondary | ICD-10-CM

## 2022-12-05 DIAGNOSIS — F411 Generalized anxiety disorder: Secondary | ICD-10-CM

## 2022-12-05 DIAGNOSIS — F410 Panic disorder [episodic paroxysmal anxiety] without agoraphobia: Secondary | ICD-10-CM

## 2022-12-05 MED ORDER — CLONAZEPAM 0.5 MG PO TABS: 0.5000 mg | ORAL_TABLET | Freq: Two times a day (BID) | ORAL | 1 refills | Status: DC | PRN

## 2022-12-05 MED ORDER — BUPROPION HCL ER (XL) 300 MG PO TB24: 300.0000 mg | ORAL_TABLET | Freq: Every day | ORAL | 1 refills | Status: DC

## 2022-12-05 NOTE — Progress Notes (Signed)
Sergio Griffin [id number] Feb 24, 1951 72 y.o.  Virtual Visit via Telephone Note  I connected with pt on 12/05/22 at  9:30 AM EST by telephone and verified that I am speaking with the correct person using two identifiers.   I discussed the limitations, risks, security and privacy concerns of performing an evaluation and management service by telephone and the availability of in person appointments. I also discussed with the patient that there may be a patient responsible charge related to this service. The patient expressed understanding and agreed to proceed.   I discussed the assessment and treatment plan with the patient. The patient was provided an opportunity to ask questions and all were answered. The patient agreed with the plan and demonstrated an understanding of the instructions.   The patient was advised to call back or seek an in-person evaluation if the symptoms worsen or if the condition fails to improve as anticipated.  I provided 30 minutes of non-face-to-face time during this encounter.  The patient was located at home.  The provider was located at [place].   Sergio Griffin, PMHNP   Subjective:   Patient ID:  Sergio Griffin is a 72 y.o. (DOB 1951/11/12) male.  Chief Complaint:  Chief Complaint  Patient presents with   Follow-up    Anxiety, depression, and insomnia    HPI Sergio Griffin presents for follow-up of depression, anxiety, and insomnia. He reports, "the last month has been a real trial for me. I am coping, but it has been one thing after another." He reports that before Christmas he had an acute GI illness and he had an intestinal obstruction and was hospitalized. He came home and the following day his brother died in the hospital. He spent Christmas eve with his children and their mother. He reports that he felt fatigued when he left and woke up Christmas morning and had viral symptoms. He reports that his health is gradually improving and that  he continues to have some fatigue. Today is anniversary of mother's death. He reports that his brother and brother's son were estranged. He reports that his nephew said he would handle cremation and other affairs, however he has not heard anything and is not sure where his brother's body is. Learned that son fell yesterday on outside steps and was briefly unresponsive. He reports that he had anxiety about son's health and possible underlying health issue.   He reports that he is "not sure" if he is experiencing depression. "I'm not really that depressed." He reports that he has been thinking about death and mortality with multiple deaths in the family. He denies significant anxiety- "I'm doing better with that than ever." He reports that his motivation varies. He reports "I haven't been taking as good of care as myself and my home as I usually would." He reports that motivation has been lower the last couple of days. He reports that he is getting adequate rest with multiple awakenings. Appetite has been "ok" and reports "eating lighter" than he has in the past. Denies SI.   Klonopin was last filled 12/15/21.  Past Psychiatric Medication Trials: Prozac- Adverse effects Wellbutrin- helpful for depression Celexa- Affective dulling Lexapro Imipramine- Helpful for anxiety, worsened glaucoma Klonopin- effective Ambien-ineffective.  Buspar   Review of Systems:  Review of Systems  HENT:  Positive for voice change.   Gastrointestinal: Negative.   Musculoskeletal:  Negative for gait problem.  Psychiatric/Behavioral:         Please refer to HPI  He reports URI s/s for about 2 weeks.   Medications: I have reviewed the patient's current medications.  Current Outpatient Medications  Medication Sig Dispense Refill   acetaminophen (TYLENOL) 500 MG tablet Take 500-1,000 mg by mouth every 6 (six) hours as needed for moderate pain or headache.     amLODipine (NORVASC) 5 MG tablet Take 5 mg by mouth daily.      ammonium lactate (AMLACTIN) 12 % cream Apply topically 2 (two) times daily.     aspirin EC 81 MG tablet Take 81 mg by mouth at bedtime.      buPROPion (WELLBUTRIN XL) 300 MG 24 hr tablet Take 1 tablet (300 mg total) by mouth daily. 90 tablet 1   Carboxymethylcellul-Glycerin (LUBRICATING EYE DROPS OP) Place 1 drop into both eyes daily as needed (dry eyes).     ciprofloxacin-dexamethasone (CIPRODEX) OTIC suspension SMARTSIG:In Ear(s)     clonazePAM (KLONOPIN) 0.5 MG tablet Take 1 tablet (0.5 mg total) by mouth 2 (two) times daily as needed. for anxiety 180 tablet 1   DM-APAP-CPM (CORICIDIN HBP PO) Take 1 tablet by mouth daily as needed (severe allergies).     furosemide (LASIX) 20 MG tablet Take 20 mg by mouth daily.     ketoconazole (NIZORAL) 2 % shampoo Apply topically.     latanoprost (XALATAN) 0.005 % ophthalmic solution Place 1 drop into both eyes at bedtime.     losartan (COZAAR) 50 MG tablet Take 100 mg by mouth daily.      metoprolol succinate (TOPROL-XL) 25 MG 24 hr tablet Take 12.5-25 mg by mouth See admin instructions. Take 25 mg in the morning and 12.5 mg in the evening     miconazole (MICONAZOLE ANTIFUNGAL) 2 % cream Apply 1 application topically 2 (two) times daily. For 4-6 weeks 28.35 g 0   Multiple Vitamin (MULTIVITAMIN) tablet Take 1 tablet by mouth daily.     nitroGLYCERIN (NITROSTAT) 0.4 MG SL tablet Place 1 tablet (0.4 mg total) under the tongue every 5 (five) minutes x 3 doses as needed for chest pain. 30 tablet 1   ofloxacin (OCUFLOX) 0.3 % ophthalmic solution Place 1 drop into the right eye 3 (three) times daily. (Patient not taking: Reported on 06/04/2022)     Omega-3 Fatty Acids (FISH OIL) 1000 MG CAPS Take 1,000 mg by mouth daily.     ondansetron (ZOFRAN) 4 MG tablet Take 4 mg by mouth every 8 (eight) hours as needed for nausea or vomiting.     ondansetron (ZOFRAN-ODT) 4 MG disintegrating tablet DISSOLVE 1 TABLET IN MOUTH EVERY 8 HOURS AS NEEDED FOR NAUSEA FOR VOMITING  30 tablet 0   pantoprazole (PROTONIX) 40 MG tablet Take 1 tablet (40 mg total) by mouth daily. (Patient taking differently: Take 40 mg by mouth as needed.) 90 tablet 3   prasugrel (EFFIENT) 10 MG TABS tablet Take 1 tablet (10 mg total) by mouth at bedtime. Resume 48 hours after surgery 30 tablet    Sorbitol SOLN Take 30 mLs by mouth daily as needed. For constipation 473 mL 1   sucralfate (CARAFATE) 1 g tablet Take 1 tablet (1 g total) by mouth at bedtime.     No current facility-administered medications for this visit.    Medication Side Effects: None  Allergies:  Allergies  Allergen Reactions   Tamsulosin Other (See Comments)    Light headed and pass out   Ciprofloxacin Itching   Ibuprofen Diarrhea and Nausea Only   Propoxyphene Itching    Darvocet  Sulfa Antibiotics Itching    Past Medical History:  Diagnosis Date   Anxiety    Barrett's esophagus    CAD (coronary artery disease)    PCI, stent 2011   Cardiac arrhythmia    Chicken pox    Depression    Diverticulitis    Diverticulosis    Gastric ulcer    GERD (gastroesophageal reflux disease)    Glaucoma    Heart attack ([place]) 01/29/2019   History of myocardial infarction    Hyperlipidemia    Hypertension    Kidney stones    Kidney stones    Pneumonia 11/2014   Prostatitis    Reflux    Sleep apnea    UTI (lower urinary tract infection)     Family History  Problem Relation Age of Onset   Arthritis Mother    Hyperlipidemia Mother    Stroke Mother    Hypertension Mother    Ovarian cancer Mother    Hyperlipidemia Father    Heart disease Father    Hypertension Father    Non-Hodgkin's lymphoma Father    Hyperlipidemia Maternal Grandmother    Heart disease Maternal Grandmother    Stroke Maternal Grandmother    Hypertension Maternal Grandmother    Diabetes Maternal Grandmother    Hyperlipidemia Maternal Grandfather    Heart disease Maternal Grandfather    Hypertension Maternal Grandfather    Heart disease  Paternal Grandmother    Heart disease Paternal Grandfather    Stroke Paternal Grandfather    Hypertension Paternal Grandfather    Congestive Heart Failure Brother    Colon cancer Neg Hx    Esophageal cancer Neg Hx    Pancreatic cancer Neg Hx    Stomach cancer Neg Hx    Liver disease Neg Hx     Social History   Socioeconomic History   Marital status: Divorced    Spouse name: Not on file   Number of children: 2   Years of education: 16   Highest education level: Not on file  Occupational History   Occupation: Estate manager/land agent    Comment: Retired  Tobacco Use   Smoking status: Former    Types: Cigarettes   Smokeless tobacco: Never   Tobacco comments:    quit 1987  Vaping Use   Vaping Use: Never used  Substance and Sexual Activity   Alcohol use: No   Drug use: No   Sexual activity: Not Currently    Partners: Female  Other Topics Concern   Not on file  Social History Narrative   Fun: Paint, cook, travel   Denies religious beliefs effecting health care.    Social Determinants of Health   Financial Resource Strain: Not on file  Food Insecurity: Not on file  Transportation Needs: Not on file  Physical Activity: Not on file  Stress: Not on file  Social Connections: Not on file  Intimate Partner Violence: Not on file    Past Medical History, Surgical history, Social history, and Family history were reviewed and updated as appropriate.   Please see review of systems for further details on the patient's review from today.   Objective:   Physical Exam:  There were no vitals taken for this visit.  Physical Exam Neurological:     Mental Status: He is alert and oriented to person, place, and time.     Cranial Nerves: No dysarthria.  Psychiatric:        Attention and Perception: Attention and perception normal.  Speech: Speech normal.        Behavior: Behavior is cooperative.        Thought Content: Thought content normal. Thought content is not paranoid  or delusional. Thought content does not include homicidal or suicidal ideation. Thought content does not include homicidal or suicidal plan.        Cognition and Memory: Cognition and memory normal.        Judgment: Judgment normal.     Comments: Insight intact Mood is appropriate to content. Affect is congruent.      Lab Review:     Component Value Date/Time   NA 139 02/14/2021 0748   K 4.5 02/14/2021 0748   CL 99 02/14/2021 0748   CO2 30 02/14/2021 0748   GLUCOSE 150 (H) 02/14/2021 0748   BUN 35 (H) 02/14/2021 0748   CREATININE 1.73 (H) 02/14/2021 0748   CALCIUM 9.7 02/14/2021 0748   PROT 6.4 (L) 09/26/2020 0713   ALBUMIN 3.5 09/26/2020 0713   AST 16 09/26/2020 0713   ALT 24 09/26/2020 0713   ALKPHOS 36 (L) 09/26/2020 0713   BILITOT 0.7 09/26/2020 0713   GFRNONAA 58 (L) 09/28/2020 0456   GFRAA 31 (L) 10/20/2019 1419       Component Value Date/Time   WBC 11.0 (H) 09/28/2020 0456   RBC 4.69 09/28/2020 0456   HGB 14.0 09/28/2020 0456   HCT 46.2 09/28/2020 0456   PLT 240 09/28/2020 0456   MCV 98.5 09/28/2020 0456   MCH 29.9 09/28/2020 0456   MCHC 30.3 09/28/2020 0456   RDW 13.4 09/28/2020 0456   LYMPHSABS 2.7 10/07/2019 1055   MONOABS 1.0 10/07/2019 1055   EOSABS 0.3 10/07/2019 1055   BASOSABS 0.2 (H) 10/07/2019 1055    No results found for: "POCLITH", "LITHIUM"   No results found for: "PHENYTOIN", "PHENOBARB", "VALPROATE", "CBMZ"   .res Assessment: Plan:    Pt seen for 30 minutes and time spent discussing multiple recent stressors and losses, and how this has affected his mood and anxiety. Pt reports that his anxiety has been much lower than he would expect considering the circumstances and that depression has not been severe. He reports that he would like to continue current medications without changes at this time. He reports that he does not think that he needs therapy at this time and will contact office if he would like a referral for therapy in the future.   Continue Wellbutrin XL 300 mg daily for depression.  Continue Klonopin 0.5 mg po BID prn anxiety.  Pt to follow-up in 3 months or sooner if clinically indicated.  Patient advised to contact office with any questions, adverse effects, or acute worsening in signs and symptoms.   Kinta was seen today for follow-up.  Diagnoses and all orders for this visit:  Generalized anxiety disorder  Recurrent major depressive disorder, in partial remission (HCC) -     buPROPion (WELLBUTRIN XL) 300 MG 24 hr tablet; Take 1 tablet (300 mg total) by mouth daily.  Panic disorder -     clonazePAM (KLONOPIN) 0.5 MG tablet; Take 1 tablet (0.5 mg total) by mouth 2 (two) times daily as needed. for anxiety    Please see After Visit Summary for patient specific instructions.  Future Appointments  Date Time Provider [place]  12/20/2022  9:30 AM Mauri Pole, MD [facility]    No orders of the defined types were placed in this encounter.     -------------------------------

## 2022-12-20 ENCOUNTER — Ambulatory Visit: Payer: Medicare Other | Admitting: Gastroenterology

## 2023-03-13 ENCOUNTER — Telehealth: Payer: Medicare Other | Admitting: Psychiatry

## 2023-03-13 DIAGNOSIS — Z91199 Patient's noncompliance with other medical treatment and regimen due to unspecified reason: Secondary | ICD-10-CM

## 2023-03-13 NOTE — Progress Notes (Unsigned)
Sergio Griffin [id number] May 11, 1951 72 y.o.  Virtual Visit via Video Note  I connected with pt @ on 03/13/23 at  9:30 AM EDT by a video enabled telemedicine application and verified that I am speaking with the correct person using two identifiers.   I discussed the limitations of evaluation and management by telemedicine and the availability of in person appointments. The patient expressed understanding and agreed to proceed.  I discussed the assessment and treatment plan with the patient. The patient was provided an opportunity to ask questions and all were answered. The patient agreed with the plan and demonstrated an understanding of the instructions.   The patient was advised to call back or seek an in-person evaluation if the symptoms worsen or if the condition fails to improve as anticipated.  I provided *** minutes of non-face-to-face time during this encounter.  The patient was located at home.  The provider was located at [facility].   Sergio Griffin, PMHNP   Subjective:   Patient ID:  Sergio Griffin is a 72 y.o. (DOB 1951-09-01) male.  Chief Complaint: No chief complaint on file.   HPI Sergio Griffin presents for follow-up of ***   Review of Systems:  Review of Systems  Medications: {medication reviewed/display:3041432}  Current Outpatient Medications  Medication Sig Dispense Refill   acetaminophen (TYLENOL) 500 MG tablet Take 500-1,000 mg by mouth every 6 (six) hours as needed for moderate pain or headache.     amLODipine (NORVASC) 5 MG tablet Take 5 mg by mouth daily.     ammonium lactate (AMLACTIN) 12 % cream Apply topically 2 (two) times daily.     aspirin EC 81 MG tablet Take 81 mg by mouth at bedtime.      buPROPion (WELLBUTRIN XL) 300 MG 24 hr tablet Take 1 tablet (300 mg total) by mouth daily. 90 tablet 1   Carboxymethylcellul-Glycerin (LUBRICATING EYE DROPS OP) Place 1 drop into both eyes daily as needed (dry eyes).      ciprofloxacin-dexamethasone (CIPRODEX) OTIC suspension SMARTSIG:In Ear(s)     clonazePAM (KLONOPIN) 0.5 MG tablet Take 1 tablet (0.5 mg total) by mouth 2 (two) times daily as needed. for anxiety 180 tablet 1   DM-APAP-CPM (CORICIDIN HBP PO) Take 1 tablet by mouth daily as needed (severe allergies).     furosemide (LASIX) 20 MG tablet Take 20 mg by mouth daily.     ketoconazole (NIZORAL) 2 % shampoo Apply topically.     latanoprost (XALATAN) 0.005 % ophthalmic solution Place 1 drop into both eyes at bedtime.     losartan (COZAAR) 50 MG tablet Take 100 mg by mouth daily.      metoprolol succinate (TOPROL-XL) 25 MG 24 hr tablet Take 12.5-25 mg by mouth See admin instructions. Take 25 mg in the morning and 12.5 mg in the evening     miconazole (MICONAZOLE ANTIFUNGAL) 2 % cream Apply 1 application topically 2 (two) times daily. For 4-6 weeks 28.35 g 0   Multiple Vitamin (MULTIVITAMIN) tablet Take 1 tablet by mouth daily.     nitroGLYCERIN (NITROSTAT) 0.4 MG SL tablet Place 1 tablet (0.4 mg total) under the tongue every 5 (five) minutes x 3 doses as needed for chest pain. 30 tablet 1   ofloxacin (OCUFLOX) 0.3 % ophthalmic solution Place 1 drop into the right eye 3 (three) times daily. (Patient not taking: Reported on 06/04/2022)     Omega-3 Fatty Acids (FISH OIL) 1000 MG CAPS Take 1,000 mg by mouth daily.  ondansetron (ZOFRAN) 4 MG tablet Take 4 mg by mouth every 8 (eight) hours as needed for nausea or vomiting.     ondansetron (ZOFRAN-ODT) 4 MG disintegrating tablet DISSOLVE 1 TABLET IN MOUTH EVERY 8 HOURS AS NEEDED FOR NAUSEA FOR VOMITING 30 tablet 0   pantoprazole (PROTONIX) 40 MG tablet Take 1 tablet (40 mg total) by mouth daily. (Patient taking differently: Take 40 mg by mouth as needed.) 90 tablet 3   prasugrel (EFFIENT) 10 MG TABS tablet Take 1 tablet (10 mg total) by mouth at bedtime. Resume 48 hours after surgery 30 tablet    Sorbitol SOLN Take 30 mLs by mouth daily as needed. For constipation  473 mL 1   sucralfate (CARAFATE) 1 g tablet Take 1 tablet (1 g total) by mouth at bedtime.     No current facility-administered medications for this visit.    Medication Side Effects: {Medication Side Effects (Optional):21014029}  Allergies:  Allergies  Allergen Reactions   Tamsulosin Other (See Comments)    Light headed and pass out   Ciprofloxacin Itching   Ibuprofen Diarrhea and Nausea Only   Propoxyphene Itching    Darvocet   Sulfa Antibiotics Itching    Past Medical History:  Diagnosis Date   Anxiety    Barrett's esophagus    CAD (coronary artery disease)    PCI, stent 2011   Cardiac arrhythmia    Chicken pox    Depression    Diverticulitis    Diverticulosis    Gastric ulcer    GERD (gastroesophageal reflux disease)    Glaucoma    Heart attack (HCC) 01/29/2019   History of myocardial infarction    Hyperlipidemia    Hypertension    Kidney stones    Kidney stones    Pneumonia 11/2014   Prostatitis    Reflux    Sleep apnea    UTI (lower urinary tract infection)     Family History  Problem Relation Age of Onset   Arthritis Mother    Hyperlipidemia Mother    Stroke Mother    Hypertension Mother    Ovarian cancer Mother    Hyperlipidemia Father    Heart disease Father    Hypertension Father    Non-Hodgkin's lymphoma Father    Hyperlipidemia Maternal Grandmother    Heart disease Maternal Grandmother    Stroke Maternal Grandmother    Hypertension Maternal Grandmother    Diabetes Maternal Grandmother    Hyperlipidemia Maternal Grandfather    Heart disease Maternal Grandfather    Hypertension Maternal Grandfather    Heart disease Paternal Grandmother    Heart disease Paternal Grandfather    Stroke Paternal Grandfather    Hypertension Paternal Grandfather    Congestive Heart Failure Brother    Colon cancer Neg Hx    Esophageal cancer Neg Hx    Pancreatic cancer Neg Hx    Stomach cancer Neg Hx    Liver disease Neg Hx     Social History    Socioeconomic History   Marital status: Divorced    Spouse name: Not on file   Number of children: 2   Years of education: 16   Highest education level: Not on file  Occupational History   Occupation: Academic librarian    Comment: Retired  Tobacco Use   Smoking status: Former    Types: Cigarettes   Smokeless tobacco: Never   Tobacco comments:    quit 1987  Vaping Use   Vaping Use: Never used  Substance and Sexual  Activity   Alcohol use: No   Drug use: No   Sexual activity: Not Currently    Partners: Female  Other Topics Concern   Not on file  Social History Narrative   Fun: Paint, cook, travel   Denies religious beliefs effecting health care.    Social Determinants of Health   Financial Resource Strain: Not on file  Food Insecurity: Not on file  Transportation Needs: Not on file  Physical Activity: Not on file  Stress: Not on file  Social Connections: Not on file  Intimate Partner Violence: Not on file    Past Medical History, Surgical history, Social history, and Family history were reviewed and updated as appropriate.   Please see review of systems for further details on the patient's review from today.   Objective:   Physical Exam:  There were no vitals taken for this visit.  Physical Exam  Lab Review:     Component Value Date/Time   NA 139 02/14/2021 0748   K 4.5 02/14/2021 0748   CL 99 02/14/2021 0748   CO2 30 02/14/2021 0748   GLUCOSE 150 (H) 02/14/2021 0748   BUN 35 (H) 02/14/2021 0748   CREATININE 1.73 (H) 02/14/2021 0748   CALCIUM 9.7 02/14/2021 0748   PROT 6.4 (L) 09/26/2020 0713   ALBUMIN 3.5 09/26/2020 0713   AST 16 09/26/2020 0713   ALT 24 09/26/2020 0713   ALKPHOS 36 (L) 09/26/2020 0713   BILITOT 0.7 09/26/2020 0713   GFRNONAA 58 (L) 09/28/2020 0456   GFRAA 31 (L) 10/20/2019 1419       Component Value Date/Time   WBC 11.0 (H) 09/28/2020 0456   RBC 4.69 09/28/2020 0456   HGB 14.0 09/28/2020 0456   HCT 46.2 09/28/2020 0456    PLT 240 09/28/2020 0456   MCV 98.5 09/28/2020 0456   MCH 29.9 09/28/2020 0456   MCHC 30.3 09/28/2020 0456   RDW 13.4 09/28/2020 0456   LYMPHSABS 2.7 10/07/2019 1055   MONOABS 1.0 10/07/2019 1055   EOSABS 0.3 10/07/2019 1055   BASOSABS 0.2 (H) 10/07/2019 1055    No results found for: "POCLITH", "LITHIUM"   No results found for: "PHENYTOIN", "PHENOBARB", "VALPROATE", "CBMZ"   .res Assessment: Plan:    There are no diagnoses linked to this encounter.   Please see After Visit Summary for patient specific instructions.  No future appointments.  No orders of the defined types were placed in this encounter.     -------------------------------

## 2023-03-14 NOTE — Progress Notes (Signed)
Patient was not signed in at time of video visit.  Attempted to contact patient by telephone.  Unable to reach patient.  Patient called later to report that he had overslept and rescheduled.  No charge.

## 2023-03-25 ENCOUNTER — Encounter: Payer: Self-pay | Admitting: Psychiatry

## 2023-03-25 ENCOUNTER — Telehealth (INDEPENDENT_AMBULATORY_CARE_PROVIDER_SITE_OTHER): Payer: Medicare Other | Admitting: Psychiatry

## 2023-03-25 DIAGNOSIS — F411 Generalized anxiety disorder: Secondary | ICD-10-CM | POA: Diagnosis not present

## 2023-03-25 DIAGNOSIS — F410 Panic disorder [episodic paroxysmal anxiety] without agoraphobia: Secondary | ICD-10-CM | POA: Diagnosis not present

## 2023-03-25 DIAGNOSIS — F3341 Major depressive disorder, recurrent, in partial remission: Secondary | ICD-10-CM | POA: Diagnosis not present

## 2023-03-25 MED ORDER — BUPROPION HCL ER (XL) 300 MG PO TB24: 300.0000 mg | ORAL_TABLET | Freq: Every day | ORAL | 1 refills | Status: DC

## 2023-03-25 MED ORDER — CLONAZEPAM 0.5 MG PO TABS: 0.5000 mg | ORAL_TABLET | Freq: Two times a day (BID) | ORAL | 1 refills | Status: DC | PRN

## 2023-03-25 NOTE — Progress Notes (Signed)
Sergio Griffin [id number] 1951-10-14 72 y.o.  Virtual Visit via Video Note  I connected with pt @ on 03/25/23 at 10:30 AM EDT by a video enabled telemedicine application and verified that I am speaking with the correct person using two identifiers.   I discussed the limitations of evaluation and management by telemedicine and the availability of in person appointments. The patient expressed understanding and agreed to proceed.  I discussed the assessment and treatment plan with the patient. The patient was provided an opportunity to ask questions and all were answered. The patient agreed with the plan and demonstrated an understanding of the instructions.   The patient was advised to call back or seek an in-person evaluation if the symptoms worsen or if the condition fails to improve as anticipated.  I provided 31 minutes of non-face-to-face time during this encounter.  The patient was located at home.  The provider was located at [facility].   Corie Chiquito, PMHNP   Subjective:   Patient ID:  Sergio Griffin is a 72 y.o. (DOB 08/09/1951) male.  Chief Complaint:  Chief Complaint  Patient presents with   Fatigue   Follow-up    Depression and anxiety    HPI LEART DUKE presents for follow-up of depression, anxiety, and insomnia. He reports that he was having some trouble sleeping with fluid retention and this has improved with change from Lasix to Torsemide. He reports that he has resumed diet with avoiding flour.   Lost 19 lbs in 14 days with diuretic and dietary change. He reports that he continues to feel "tired all the time." He reports sleeping more recently. He reports, "I could sleep all day long." He reports that some days he wakes up to eat and get a drink of water, and then returns to sleep. He reports low energy and will do grocery pick up. He reports that he will use a scooter when he does need to do grocery shopping. He reports that motivation is ok. He  reports that he typically will set goals daily for himself, ie. Has set goal to clean bathroom today. He reports that tasks take him longer to complete now compared to the past. He reports that, "sometimes I just feel overwhelmed." He reports that he uses re-framing and self-talk when feeling overwhelmed. He reports, "there are so many things I would like to do, but I get up the impetus." Denies sad mood. He reports a fear of falling. He reports some "obsessive thinking" about certain tasks that he puts off. He reports "more trouble reading a book" recently and will need to read things a couple of towns. Denies memory impairment. Denies SI.   He reports that he hired some people to help him with cleaning and they damaged items and stole things. He also has had difficulty finding where they placed things. He reports that he is not "hesitant about trying to find someone to help him with things.   Daughter lives in [place] and comes to visit once a month. He reports that he talks with his son every 1-2 weeks. He reports that he talks to his cat and Alexa. He reports that he can go 4-5 consecutive days without social interaction.   Klonopin last filled 12/05/22 for 90 day supply. He reports that he is not consistently taking klonopin BID. He reports occasional difficulty falling asleep and will have certain thoughts in his head that are hard to turn off.   Past Psychiatric Medication Trials: Prozac- Adverse  effects Wellbutrin- helpful for depression Celexa- Affective dulling Lexapro Imipramine- Helpful for anxiety, worsened glaucoma Klonopin- effective Ambien-ineffective.  Buspar  Review of Systems:  Review of Systems  Constitutional:  Positive for fatigue.  Cardiovascular:        Reports improved fluid retention  Musculoskeletal:  Negative for gait problem.  Psychiatric/Behavioral:         Please refer to HPI    He reports that he has been having difficulty with fluid retention. Has follow-up  with PCP tomorrow.  Medications: I have reviewed the patient's current medications.  Current Outpatient Medications  Medication Sig Dispense Refill   FEROSUL 325 (65 Fe) MG tablet Take 325 mg by mouth daily.     torsemide (DEMADEX) 20 MG tablet Take 20 mg by mouth 2 (two) times daily.     Vitamin D, Ergocalciferol, (DRISDOL) 1.25 MG (50000 UNIT) CAPS capsule Take 50,000 Units by mouth once a week.     acetaminophen (TYLENOL) 500 MG tablet Take 500-1,000 mg by mouth every 6 (six) hours as needed for moderate pain or headache.     amLODipine (NORVASC) 5 MG tablet Take 5 mg by mouth daily.     ammonium lactate (AMLACTIN) 12 % cream Apply topically 2 (two) times daily.     aspirin EC 81 MG tablet Take 81 mg by mouth at bedtime.      buPROPion (WELLBUTRIN XL) 300 MG 24 hr tablet Take 1 tablet (300 mg total) by mouth daily. 90 tablet 1   Carboxymethylcellul-Glycerin (LUBRICATING EYE DROPS OP) Place 1 drop into both eyes daily as needed (dry eyes).     ciprofloxacin-dexamethasone (CIPRODEX) OTIC suspension SMARTSIG:In Ear(s)     [START ON 06/17/2023] clonazePAM (KLONOPIN) 0.5 MG tablet Take 1 tablet (0.5 mg total) by mouth 2 (two) times daily as needed. for anxiety 180 tablet 1   DM-APAP-CPM (CORICIDIN HBP PO) Take 1 tablet by mouth daily as needed (severe allergies).     ketoconazole (NIZORAL) 2 % shampoo Apply topically.     latanoprost (XALATAN) 0.005 % ophthalmic solution Place 1 drop into both eyes at bedtime.     losartan (COZAAR) 50 MG tablet Take 100 mg by mouth daily.      metoprolol succinate (TOPROL-XL) 25 MG 24 hr tablet Take 12.5-25 mg by mouth See admin instructions. Take 25 mg in the morning and 12.5 mg in the evening     miconazole (MICONAZOLE ANTIFUNGAL) 2 % cream Apply 1 application topically 2 (two) times daily. For 4-6 weeks 28.35 g 0   Multiple Vitamin (MULTIVITAMIN) tablet Take 1 tablet by mouth daily.     nitroGLYCERIN (NITROSTAT) 0.4 MG SL tablet Place 1 tablet (0.4 mg total)  under the tongue every 5 (five) minutes x 3 doses as needed for chest pain. 30 tablet 1   ofloxacin (OCUFLOX) 0.3 % ophthalmic solution Place 1 drop into the right eye 3 (three) times daily. (Patient not taking: Reported on 06/04/2022)     Omega-3 Fatty Acids (FISH OIL) 1000 MG CAPS Take 1,000 mg by mouth daily.     ondansetron (ZOFRAN) 4 MG tablet Take 4 mg by mouth every 8 (eight) hours as needed for nausea or vomiting.     ondansetron (ZOFRAN-ODT) 4 MG disintegrating tablet DISSOLVE 1 TABLET IN MOUTH EVERY 8 HOURS AS NEEDED FOR NAUSEA FOR VOMITING 30 tablet 0   pantoprazole (PROTONIX) 40 MG tablet Take 1 tablet (40 mg total) by mouth daily. (Patient taking differently: Take 40 mg by mouth as needed.)  90 tablet 3   prasugrel (EFFIENT) 10 MG TABS tablet Take 1 tablet (10 mg total) by mouth at bedtime. Resume 48 hours after surgery 30 tablet    Sorbitol SOLN Take 30 mLs by mouth daily as needed. For constipation 473 mL 1   sucralfate (CARAFATE) 1 g tablet Take 1 tablet (1 g total) by mouth at bedtime.     No current facility-administered medications for this visit.    Medication Side Effects: None  Allergies:  Allergies  Allergen Reactions   Tamsulosin Other (See Comments)    Light headed and pass out   Ciprofloxacin Itching   Ibuprofen Diarrhea and Nausea Only   Propoxyphene Itching    Darvocet   Sulfa Antibiotics Itching    Past Medical History:  Diagnosis Date   Anxiety    Barrett's esophagus    CAD (coronary artery disease)    PCI, stent 2011   Cardiac arrhythmia    Chicken pox    Depression    Diverticulitis    Diverticulosis    Gastric ulcer    GERD (gastroesophageal reflux disease)    Glaucoma    Heart attack (HCC) 01/29/2019   History of myocardial infarction    Hyperlipidemia    Hypertension    Kidney stones    Kidney stones    Pneumonia 11/2014   Prostatitis    Reflux    Sleep apnea    UTI (lower urinary tract infection)     Family History  Problem  Relation Age of Onset   Arthritis Mother    Hyperlipidemia Mother    Stroke Mother    Hypertension Mother    Ovarian cancer Mother    Hyperlipidemia Father    Heart disease Father    Hypertension Father    Non-Hodgkin's lymphoma Father    Hyperlipidemia Maternal Grandmother    Heart disease Maternal Grandmother    Stroke Maternal Grandmother    Hypertension Maternal Grandmother    Diabetes Maternal Grandmother    Hyperlipidemia Maternal Grandfather    Heart disease Maternal Grandfather    Hypertension Maternal Grandfather    Heart disease Paternal Grandmother    Heart disease Paternal Grandfather    Stroke Paternal Grandfather    Hypertension Paternal Grandfather    Congestive Heart Failure Brother    Colon cancer Neg Hx    Esophageal cancer Neg Hx    Pancreatic cancer Neg Hx    Stomach cancer Neg Hx    Liver disease Neg Hx     Social History   Socioeconomic History   Marital status: Divorced    Spouse name: Not on file   Number of children: 2   Years of education: 16   Highest education level: Not on file  Occupational History   Occupation: Academic librarian    Comment: Retired  Tobacco Use   Smoking status: Former    Types: Cigarettes   Smokeless tobacco: Never   Tobacco comments:    quit 1987  Vaping Use   Vaping Use: Never used  Substance and Sexual Activity   Alcohol use: No   Drug use: No   Sexual activity: Not Currently    Partners: Female  Other Topics Concern   Not on file  Social History Narrative   Fun: Paint, cook, travel   Denies religious beliefs effecting health care.    Social Determinants of Health   Financial Resource Strain: Not on file  Food Insecurity: Not on file  Transportation Needs: Not on file  Physical Activity: Not on file  Stress: Not on file  Social Connections: Not on file  Intimate Partner Violence: Not on file    Past Medical History, Surgical history, Social history, and Family history were reviewed and  updated as appropriate.   Please see review of systems for further details on the patient's review from today.   Objective:   Physical Exam:  There were no vitals taken for this visit.  Physical Exam Constitutional:      General: He is not in acute distress. Musculoskeletal:        General: No deformity.  Neurological:     Mental Status: He is alert and oriented to person, place, and time.     Coordination: Coordination normal.  Psychiatric:        Attention and Perception: Attention and perception normal. He does not perceive auditory or visual hallucinations.        Mood and Affect: Mood normal. Mood is not anxious or depressed. Affect is not labile, blunt, angry or inappropriate.        Speech: Speech normal.        Behavior: Behavior normal.        Thought Content: Thought content normal. Thought content is not paranoid or delusional. Thought content does not include homicidal or suicidal ideation. Thought content does not include homicidal or suicidal plan.        Cognition and Memory: Cognition and memory normal.        Judgment: Judgment normal.     Comments: Insight intact     Lab Review:     Component Value Date/Time   NA 139 02/14/2021 0748   K 4.5 02/14/2021 0748   CL 99 02/14/2021 0748   CO2 30 02/14/2021 0748   GLUCOSE 150 (H) 02/14/2021 0748   BUN 35 (H) 02/14/2021 0748   CREATININE 1.73 (H) 02/14/2021 0748   CALCIUM 9.7 02/14/2021 0748   PROT 6.4 (L) 09/26/2020 0713   ALBUMIN 3.5 09/26/2020 0713   AST 16 09/26/2020 0713   ALT 24 09/26/2020 0713   ALKPHOS 36 (L) 09/26/2020 0713   BILITOT 0.7 09/26/2020 0713   GFRNONAA 58 (L) 09/28/2020 0456   GFRAA 31 (L) 10/20/2019 1419       Component Value Date/Time   WBC 11.0 (H) 09/28/2020 0456   RBC 4.69 09/28/2020 0456   HGB 14.0 09/28/2020 0456   HCT 46.2 09/28/2020 0456   PLT 240 09/28/2020 0456   MCV 98.5 09/28/2020 0456   MCH 29.9 09/28/2020 0456   MCHC 30.3 09/28/2020 0456   RDW 13.4 09/28/2020  0456   LYMPHSABS 2.7 10/07/2019 1055   MONOABS 1.0 10/07/2019 1055   EOSABS 0.3 10/07/2019 1055   BASOSABS 0.2 (H) 10/07/2019 1055    No results found for: "POCLITH", "LITHIUM"   No results found for: "PHENYTOIN", "PHENOBARB", "VALPROATE", "CBMZ"   .res Assessment: Plan:    Encouraged pt to discuss continued fatigue with PCP during scheduled apt tomorrow. Pt denies depressed mood and any other depressive symptoms besides fatigue. He reports that he would like to continue current medications without changes.  Will continue Wellbutrin XL 300 mg daily for depression.  Continue Klonopin 0.5 mg po BID prn anxiety.  Pt to follow-up in 4 months or sooner if clinically indicated.  Patient advised to contact office with any questions, adverse effects, or acute worsening in signs and symptoms.  I spent 31 minutes dedicated to the care of this patient on the date of this encounter  to include pre-visit review of records, face-to-face time with the patient discussing fatigue/hypersomnolence, ordering of medication, and post visit documentation.   Ivie was seen today for fatigue and follow-up.  Diagnoses and all orders for this visit:  Generalized anxiety disorder -     clonazePAM (KLONOPIN) 0.5 MG tablet; Take 1 tablet (0.5 mg total) by mouth 2 (two) times daily as needed. for anxiety  Recurrent major depressive disorder, in partial remission (HCC) -     buPROPion (WELLBUTRIN XL) 300 MG 24 hr tablet; Take 1 tablet (300 mg total) by mouth daily.  Panic disorder -     clonazePAM (KLONOPIN) 0.5 MG tablet; Take 1 tablet (0.5 mg total) by mouth 2 (two) times daily as needed. for anxiety     Please see After Visit Summary for patient specific instructions.  No future appointments.  No orders of the defined types were placed in this encounter.     -------------------------------

## 2023-07-16 ENCOUNTER — Ambulatory Visit: Payer: Medicare Other | Admitting: Cardiovascular Disease

## 2023-07-17 ENCOUNTER — Ambulatory Visit: Payer: Medicare Other | Attending: Cardiovascular Disease | Admitting: Cardiovascular Disease

## 2023-07-17 ENCOUNTER — Encounter: Payer: Self-pay | Admitting: Cardiovascular Disease

## 2023-07-17 VITALS — BP 100/62 | HR 84 | Ht 71.0 in | Wt 322.4 lb

## 2023-07-17 DIAGNOSIS — I10 Essential (primary) hypertension: Secondary | ICD-10-CM | POA: Diagnosis present

## 2023-07-17 DIAGNOSIS — I214 Non-ST elevation (NSTEMI) myocardial infarction: Secondary | ICD-10-CM | POA: Insufficient documentation

## 2023-07-17 DIAGNOSIS — E785 Hyperlipidemia, unspecified: Secondary | ICD-10-CM | POA: Insufficient documentation

## 2023-07-17 DIAGNOSIS — E782 Mixed hyperlipidemia: Secondary | ICD-10-CM | POA: Diagnosis present

## 2023-07-17 DIAGNOSIS — I2510 Atherosclerotic heart disease of native coronary artery without angina pectoris: Secondary | ICD-10-CM | POA: Insufficient documentation

## 2023-07-17 DIAGNOSIS — G4733 Obstructive sleep apnea (adult) (pediatric): Secondary | ICD-10-CM | POA: Diagnosis present

## 2023-07-17 DIAGNOSIS — I5189 Other ill-defined heart diseases: Secondary | ICD-10-CM | POA: Diagnosis present

## 2023-07-17 NOTE — Assessment & Plan Note (Signed)
Intolerant to CPAP and BiPAP

## 2023-07-17 NOTE — Patient Instructions (Addendum)
Medication Instructions:  Your physician recommends that you continue on your current medications as directed. Please refer to the Current Medication list given to you today.  *If you need a refill on your cardiac medications before your next appointment, please call your pharmacy*   Lab Work: Your physician recommends that you return for lab work in: the next week or 2 for FASTING lipids & CMET  If you have labs (blood work) drawn today and your tests are completely normal, you will receive your results only by: MyChart Message (if you have MyChart) OR A paper copy in the mail If you have any lab test that is abnormal or we need to change your treatment, we will call you to review the results.   Testing/Procedures: Your physician has requested that you have an echocardiogram. Echocardiography is a painless test that uses sound waves to create images of your heart. It provides your doctor with information about the size and shape of your heart and how well your heart's chambers and valves are working. This procedure takes approximately one hour. There are no restrictions for this procedure. Please do NOT wear cologne, perfume, aftershave, or lotions (deodorant is allowed). Please arrive 15 minutes prior to your appointment time.    Follow-Up: At [facility], you and your health needs are our priority.  As part of our continuing mission to provide you with exceptional heart care, we have created designated Provider Care Teams.  These Care Teams include your primary Cardiologist (physician) and Advanced Practice Providers (APPs -  Physician Assistants and Nurse Practitioners) who all work together to provide you with the care you need, when you need it.  We recommend signing up for the patient portal called "MyChart".  Sign up information is provided on this After Visit Summary.  MyChart is used to connect with patients for Virtual Visits (Telemedicine).  Patients are able to view  lab/test results, encounter notes, upcoming appointments, etc.  Non-urgent messages can be sent to your provider as well.   To learn more about what you can do with MyChart, go to [email or web address].    Your next appointment:   6 month(s)  Provider:   Micah Flesher, PA-C, Marjie Skiff, PA-C, Juanda Crumble, PA-C, Azalee Course, PA-C, or Bernadene Person, NP       Then, Nanetta Batty, MD will plan to see you again in 12 month(s).

## 2023-07-17 NOTE — Assessment & Plan Note (Signed)
History of diastolic dysfunction on torsemide.  He has no peripheral edema today.

## 2023-07-17 NOTE — Progress Notes (Signed)
07/17/2023 Sergio Griffin   02-27-1951  [id number]  Primary Physician Romeo Rabon, MD Primary Cardiologist: Runell Gess MD Nicholes Calamity, [place]  HPI:  Sergio Griffin is a 72 y.o. 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  He is being seen to be established to change cardiologist at his request.  He was referred by Eldred Manges nurse practitioner.  His risk factors include treated hypertension and hyperlipidemia.  His father apparently had a stent.  He smoked remotely over 3 years ago.  He apparently had multiple coronary interventions beginning in 2011 with subsequent stents in 2019 or 20.  By history he has diastolic dysfunction.  He does have a history of obstructive sleep apnea intolerant to CPAP and BiPAP.  He complains of shortness of breath which I think is primarily related to his morbid obesity.  He has chronic back pain and walks with a rollator.  He denies chest pain.   Current Meds  Medication Sig   amLODipine (NORVASC) 5 MG tablet Take 5 mg by mouth daily.   ammonium lactate (AMLACTIN) 12 % cream Apply topically 2 (two) times daily.   aspirin EC 81 MG tablet Take 81 mg by mouth at bedtime.    buPROPion (WELLBUTRIN XL) 300 MG 24 hr tablet Take 1 tablet (300 mg total) by mouth daily.   Carboxymethylcellul-Glycerin (LUBRICATING EYE DROPS OP) Place 1 drop into both eyes daily as needed (dry eyes).   clonazePAM (KLONOPIN) 0.5 MG tablet Take 1 tablet (0.5 mg total) by mouth 2 (two) times daily as needed. for anxiety   DM-APAP-CPM (CORICIDIN HBP PO) Take 1 tablet by mouth daily as needed (severe allergies).   ketoconazole (NIZORAL) 2 % shampoo Apply topically.   latanoprost (XALATAN) 0.005 % ophthalmic solution Place 1 drop into both eyes at bedtime.   losartan (COZAAR) 50 MG tablet Take 100 mg by mouth daily.    metoprolol succinate (TOPROL-XL) 25 MG 24 hr tablet Take  12.5-25 mg by mouth See admin instructions. Take 25 mg in the morning and 12.5 mg in the evening   miconazole (MICONAZOLE ANTIFUNGAL) 2 % cream Apply 1 application topically 2 (two) times daily. For 4-6 weeks   Multiple Vitamin (MULTIVITAMIN) tablet Take 1 tablet by mouth daily.   Omega-3 Fatty Acids (FISH OIL) 1000 MG CAPS Take 1,000 mg by mouth daily.   ondansetron (ZOFRAN-ODT) 4 MG disintegrating tablet DISSOLVE 1 TABLET IN MOUTH EVERY 8 HOURS AS NEEDED FOR NAUSEA FOR VOMITING   pantoprazole (PROTONIX) 40 MG tablet Take 1 tablet (40 mg total) by mouth daily. (Patient taking differently: Take 40 mg by mouth as needed.)   prasugrel (EFFIENT) 10 MG TABS tablet Take 1 tablet (10 mg total) by mouth at bedtime. Resume 48 hours after surgery   sucralfate (CARAFATE) 1 g tablet Take 1 tablet (1 g total) by mouth at bedtime.   torsemide (DEMADEX) 20 MG tablet Take 20 mg by mouth 2 (two) times daily.   Vitamin D, Ergocalciferol, (DRISDOL) 1.25 MG (50000 UNIT) CAPS capsule Take 50,000 Units by mouth once a week.     Allergies  Allergen Reactions   Tamsulosin Other (See Comments)    Light headed and pass out   Ciprofloxacin Itching   Ibuprofen Diarrhea and Nausea Only   Propoxyphene Itching    Darvocet   Sulfa Antibiotics Itching    Social History   Socioeconomic History  Marital status: Divorced    Spouse name: Not on file   Number of children: 2   Years of education: 110   Highest education level: Not on file  Occupational History   Occupation: Academic librarian    Comment: Retired  Tobacco Use   Smoking status: Former    Types: Cigarettes   Smokeless tobacco: Never   Tobacco comments:    quit 1987  Vaping Use   Vaping status: Never Used  Substance and Sexual Activity   Alcohol use: No   Drug use: No   Sexual activity: Not Currently    Partners: Female  Other Topics Concern   Not on file  Social History Narrative   Fun: Paint, cook, travel   Denies religious beliefs  effecting health care.    Social Determinants of Health   Financial Resource Strain: Not on file  Food Insecurity: Not on file  Transportation Needs: Not on file  Physical Activity: Not on file  Stress: Not on file  Social Connections: Not on file  Intimate Partner Violence: Not on file     Review of Systems: General: negative for chills, fever, night sweats or weight changes.  Cardiovascular: negative for chest pain, dyspnea on exertion, edema, orthopnea, palpitations, paroxysmal nocturnal dyspnea or shortness of breath Dermatological: negative for rash Respiratory: negative for cough or wheezing Urologic: negative for hematuria Abdominal: negative for nausea, vomiting, diarrhea, bright red blood per rectum, melena, or hematemesis Neurologic: negative for visual changes, syncope, or dizziness All other systems reviewed and are otherwise negative except as noted above.    Blood pressure 100/62, pulse 84, height 5\' 11"  (1.803 m), weight (!) 322 lb 6.4 oz (146.2 kg), SpO2 93%.  General appearance: alert and no distress Neck: no adenopathy, no carotid bruit, no JVD, supple, symmetrical, trachea midline, and thyroid not enlarged, symmetric, no tenderness/mass/nodules Lungs: clear to auscultation bilaterally Heart: Regular rate and rhythm without murmurs gallops rubs or clicks Extremities: extremities normal, atraumatic, no cyanosis or edema and venous stasis dermatitis noted Pulses: Diminished pedal pulses Skin: Bilateral venous stasis changes noted Neurologic: Grossly normal  EKG EKG Interpretation Date/Time:  Wednesday July 17 2023 09:31:52 EDT Ventricular Rate:  84 PR Interval:  152 QRS Duration:  88 QT Interval:  364 QTC Calculation: 430 R Axis:   -78  Text Interpretation: Sinus rhythm with marked sinus arrhythmia Left axis deviation Inferior infarct , age undetermined When compared with ECG of 24-Sep-2020 20:02, PREVIOUS ECG IS PRESENT Confirmed by Nanetta Batty  [phone]) on 07/17/2023 9:42:19 AM    ASSESSMENT AND PLAN:   NSTEMI (non-ST elevated myocardial infarction) (HCC) History of CAD status post multiple stents in the past beginning in 2011 and recurrent stenting in 2019 or 2020.  He is on aspirin and Effient (on Brilinta previously).  Will obtain his old records for documentation sake.  He denies chest pain.  OSA (obstructive sleep apnea) Intolerant to CPAP and BiPAP  Essential hypertension History of essential hypertension blood pressure measured today at 100/62.  He is on amlodipine, losartan, and metoprolol.  Hyperlipidemia History of hyperlipidemia on statin therapy with lipid profile performed by his PCP 09/28/2022 revealing total cholesterol 149, LDL of 83 and HDL of 50.  Diastolic dysfunction History of diastolic dysfunction on torsemide.  He has no peripheral edema today.  Morbid obesity (HCC) Morbid obesity with a BMI of 45.  Unfortunately, it is difficult for him to exercise because of back pain and difficulty in ambulating.  He would benefit from seeing  a dietitian however.     Runell Gess MD FACP,FACC,FAHA, [facility] 07/17/2023 10:04 AM

## 2023-07-17 NOTE — Assessment & Plan Note (Signed)
History of CAD status post multiple stents in the past beginning in 2011 and recurrent stenting in 2019 or 2020.  He is on aspirin and Effient (on Brilinta previously).  Will obtain his old records for documentation sake.  He denies chest pain.

## 2023-07-17 NOTE — Assessment & Plan Note (Signed)
History of hyperlipidemia on statin therapy with lipid profile performed by his PCP 09/28/2022 revealing total cholesterol 149, LDL of 83 and HDL of 50.

## 2023-07-17 NOTE — Assessment & Plan Note (Signed)
History of essential hypertension blood pressure measured today at 100/62.  He is on amlodipine, losartan, and metoprolol.

## 2023-07-17 NOTE — Assessment & Plan Note (Signed)
Morbid obesity with a BMI of 45.  Unfortunately, it is difficult for him to exercise because of back pain and difficulty in ambulating.  He would benefit from seeing a dietitian however.

## 2023-08-06 ENCOUNTER — Encounter: Payer: Self-pay | Admitting: Psychiatry

## 2023-08-06 ENCOUNTER — Telehealth (INDEPENDENT_AMBULATORY_CARE_PROVIDER_SITE_OTHER): Payer: Medicare Other | Admitting: Psychiatry

## 2023-08-06 DIAGNOSIS — F3341 Major depressive disorder, recurrent, in partial remission: Secondary | ICD-10-CM

## 2023-08-06 DIAGNOSIS — F411 Generalized anxiety disorder: Secondary | ICD-10-CM | POA: Diagnosis not present

## 2023-08-06 DIAGNOSIS — F410 Panic disorder [episodic paroxysmal anxiety] without agoraphobia: Secondary | ICD-10-CM

## 2023-08-06 MED ORDER — BUPROPION HCL ER (XL) 300 MG PO TB24: 300.0000 mg | ORAL_TABLET | Freq: Every day | ORAL | 1 refills | Status: DC

## 2023-08-06 NOTE — Progress Notes (Signed)
Sergio Griffin [id number] 1951/03/04 72 y.o.  Virtual Visit via Video Note  I connected with pt @ on 08/06/23 at  9:30 AM EDT by a video enabled telemedicine application and verified that I am speaking with the correct person using two identifiers.   I discussed the limitations of evaluation and management by telemedicine and the availability of in person appointments. The patient expressed understanding and agreed to proceed.  I discussed the assessment and treatment plan with the patient. The patient was provided an opportunity to ask questions and all were answered. The patient agreed with the plan and demonstrated an understanding of the instructions.   The patient was advised to call back or seek an in-person evaluation if the symptoms worsen or if the condition fails to improve as anticipated.  I provided 31 minutes of non-face-to-face time during this encounter.  The patient was located at home.  The provider was located at [facility].   Corie Chiquito, PMHNP   Subjective:   Patient ID:  Sergio Griffin is a 72 y.o. (DOB 1950/12/13) male.  Chief Complaint:  Chief Complaint  Patient presents with   Follow-up    Depression, anxiety, and insomnia    HPI [name] presents for follow-up of depression, anxiety, and insomnia. He reports, "for the most part, ok."  He reports that he has nearly fallen a few times recently. He reports that on one occasion he tripped over his cat. On another occasion he feel towards his TV and broke his TV.   Denies recent depression. He reports that he has been dreaming more than usual and having vivid dreams. He reports sleeping more than usual- "it's not that I am depressed." He reports that his energy and motivation are somewhat low. He reports that he has different ideas about things that he could be doing and has difficulty following through with these thoughts. He reports that his concentration is "good." Denies difficulty with  memory. He reports that he is "eating fine." He reports that he tends to eat when he feels hungry instead of eating meals on a set schedule. He reports that he is enjoying things. Denies SI- "quite the opposite."   He reports that he is seldom using Klonopin. Has not used Klonopin prn in the last 3 months. He reports that he does not need a refill of Klonopin at this time. He reports, "I cope with things so much better." He reports that he is now able to catch anxiety early before it progresses to panic.   He reports that he has signed up for a Tai Chi.   He reports that he has not been to visit daughter and grandchild this summer. He reports that son periodically reaches out to get together. He reports that several friends and acquaintances have passed away. Enjoys his cat.   He reports upcoming anniversaries of brother and mother's deaths.   Klonopin last filled 03/25/23.   Past Psychiatric Medication Trials: Prozac- Adverse effects Wellbutrin- helpful for depression Celexa- Affective dulling Lexapro Imipramine- Helpful for anxiety, worsened glaucoma Klonopin- effective Ambien-ineffective.  Buspar    Review of Systems:  Review of Systems  Musculoskeletal:        Shoulder pain/weakness  Neurological:        He reports that he has been occasionally light-headed when he bends over quickly at his waist  Psychiatric/Behavioral:         Please refer to HPI    Medications: I have reviewed the patient's  current medications.  Current Outpatient Medications  Medication Sig Dispense Refill   acetaminophen (TYLENOL) 500 MG tablet Take 500-1,000 mg by mouth every 6 (six) hours as needed for moderate pain or headache. (Patient not taking: Reported on 07/17/2023)     amLODipine (NORVASC) 5 MG tablet Take 5 mg by mouth daily.     ammonium lactate (AMLACTIN) 12 % cream Apply topically 2 (two) times daily.     aspirin EC 81 MG tablet Take 81 mg by mouth at bedtime.      atorvastatin (LIPITOR)  10 MG tablet Take 1 tablet by mouth daily.     buPROPion (WELLBUTRIN XL) 300 MG 24 hr tablet Take 1 tablet (300 mg total) by mouth daily. 90 tablet 1   Carboxymethylcellul-Glycerin (LUBRICATING EYE DROPS OP) Place 1 drop into both eyes daily as needed (dry eyes).     ciprofloxacin-dexamethasone (CIPRODEX) OTIC suspension SMARTSIG:In Ear(s) (Patient not taking: Reported on 07/17/2023)     clonazePAM (KLONOPIN) 0.5 MG tablet Take 1 tablet (0.5 mg total) by mouth 2 (two) times daily as needed. for anxiety (Patient not taking: Reported on 08/06/2023) 180 tablet 1   DM-APAP-CPM (CORICIDIN HBP PO) Take 1 tablet by mouth daily as needed (severe allergies).     FEROSUL 325 (65 Fe) MG tablet Take 325 mg by mouth daily. (Patient not taking: Reported on 07/17/2023)     ketoconazole (NIZORAL) 2 % shampoo Apply topically.     latanoprost (XALATAN) 0.005 % ophthalmic solution Place 1 drop into both eyes at bedtime.     losartan (COZAAR) 50 MG tablet Take 100 mg by mouth daily.      metoprolol succinate (TOPROL-XL) 25 MG 24 hr tablet Take 12.5-25 mg by mouth See admin instructions. Take 25 mg in the morning and 12.5 mg in the evening     miconazole (MICONAZOLE ANTIFUNGAL) 2 % cream Apply 1 application topically 2 (two) times daily. For 4-6 weeks 28.35 g 0   Multiple Vitamin (MULTIVITAMIN) tablet Take 1 tablet by mouth daily.     nitroGLYCERIN (NITROSTAT) 0.4 MG SL tablet Place 1 tablet (0.4 mg total) under the tongue every 5 (five) minutes x 3 doses as needed for chest pain. (Patient not taking: Reported on 07/17/2023) 30 tablet 1   ofloxacin (OCUFLOX) 0.3 % ophthalmic solution Place 1 drop into the right eye 3 (three) times daily. (Patient not taking: Reported on 07/17/2023)     Omega-3 Fatty Acids (FISH OIL) 1000 MG CAPS Take 1,000 mg by mouth daily.     ondansetron (ZOFRAN) 4 MG tablet Take 4 mg by mouth every 8 (eight) hours as needed for nausea or vomiting. (Patient not taking: Reported on 07/17/2023)      ondansetron (ZOFRAN-ODT) 4 MG disintegrating tablet DISSOLVE 1 TABLET IN MOUTH EVERY 8 HOURS AS NEEDED FOR NAUSEA FOR VOMITING 30 tablet 0   pantoprazole (PROTONIX) 40 MG tablet Take 1 tablet (40 mg total) by mouth daily. (Patient taking differently: Take 40 mg by mouth as needed.) 90 tablet 3   prasugrel (EFFIENT) 10 MG TABS tablet Take 1 tablet (10 mg total) by mouth at bedtime. Resume 48 hours after surgery 30 tablet    Sorbitol SOLN Take 30 mLs by mouth daily as needed. For constipation (Patient not taking: Reported on 07/17/2023) 473 mL 1   sucralfate (CARAFATE) 1 g tablet Take 1 tablet (1 g total) by mouth at bedtime.     torsemide (DEMADEX) 20 MG tablet Take 20 mg by mouth 2 (two) times  daily.     Vitamin D, Ergocalciferol, (DRISDOL) 1.25 MG (50000 UNIT) CAPS capsule Take 50,000 Units by mouth once a week.     No current facility-administered medications for this visit.    Medication Side Effects: None  Allergies:  Allergies  Allergen Reactions   Tamsulosin Other (See Comments)    Light headed and pass out   Ciprofloxacin Itching   Ibuprofen Diarrhea and Nausea Only   Propoxyphene Itching    Darvocet   Sulfa Antibiotics Itching    Past Medical History:  Diagnosis Date   Anxiety    Barrett's esophagus    CAD (coronary artery disease)    PCI, stent 2011   Cardiac arrhythmia    Chicken pox    Depression    Diverticulitis    Diverticulosis    Gastric ulcer    GERD (gastroesophageal reflux disease)    Glaucoma    Heart attack (HCC) 01/29/2019   History of myocardial infarction    Hyperlipidemia    Hypertension    Kidney stones    Kidney stones    Pneumonia 11/2014   Prostatitis    Reflux    Sleep apnea    UTI (lower urinary tract infection)     Family History  Problem Relation Age of Onset   Arthritis Mother    Hyperlipidemia Mother    Stroke Mother    Hypertension Mother    Ovarian cancer Mother    Hyperlipidemia Father    Heart disease Father     Hypertension Father    Non-Hodgkin's lymphoma Father    Hyperlipidemia Maternal Grandmother    Heart disease Maternal Grandmother    Stroke Maternal Grandmother    Hypertension Maternal Grandmother    Diabetes Maternal Grandmother    Hyperlipidemia Maternal Grandfather    Heart disease Maternal Grandfather    Hypertension Maternal Grandfather    Heart disease Paternal Grandmother    Heart disease Paternal Grandfather    Stroke Paternal Grandfather    Hypertension Paternal Grandfather    Congestive Heart Failure Brother    Colon cancer Neg Hx    Esophageal cancer Neg Hx    Pancreatic cancer Neg Hx    Stomach cancer Neg Hx    Liver disease Neg Hx     Social History   Socioeconomic History   Marital status: Divorced    Spouse name: Not on file   Number of children: 2   Years of education: 16   Highest education level: Not on file  Occupational History   Occupation: Academic librarian    Comment: Retired  Tobacco Use   Smoking status: Former    Types: Cigarettes   Smokeless tobacco: Never   Tobacco comments:    quit 1987  Vaping Use   Vaping status: Never Used  Substance and Sexual Activity   Alcohol use: No   Drug use: No   Sexual activity: Not Currently    Partners: Female  Other Topics Concern   Not on file  Social History Narrative   Fun: Paint, cook, travel   Denies religious beliefs effecting health care.    Social Determinants of Health   Financial Resource Strain: Not on file  Food Insecurity: Not on file  Transportation Needs: Not on file  Physical Activity: Not on file  Stress: Not on file  Social Connections: Not on file  Intimate Partner Violence: Not on file    Past Medical History, Surgical history, Social history, and Family history were reviewed and updated  as appropriate.   Please see review of systems for further details on the patient's review from today.   Objective:   Physical Exam:  There were no vitals taken for this  visit.  Physical Exam Neurological:     Mental Status: He is alert and oriented to person, place, and time.     Cranial Nerves: No dysarthria.  Psychiatric:        Attention and Perception: Attention and perception normal.        Mood and Affect: Mood normal.        Speech: Speech normal.        Behavior: Behavior is cooperative.        Thought Content: Thought content normal. Thought content is not paranoid or delusional. Thought content does not include homicidal or suicidal ideation. Thought content does not include homicidal or suicidal plan.        Cognition and Memory: Cognition and memory normal.        Judgment: Judgment normal.     Comments: Insight intact     Lab Review:     Component Value Date/Time   NA 139 02/14/2021 0748   K 4.5 02/14/2021 0748   CL 99 02/14/2021 0748   CO2 30 02/14/2021 0748   GLUCOSE 150 (H) 02/14/2021 0748   BUN 35 (H) 02/14/2021 0748   CREATININE 1.73 (H) 02/14/2021 0748   CALCIUM 9.7 02/14/2021 0748   PROT 6.4 (L) 09/26/2020 0713   ALBUMIN 3.5 09/26/2020 0713   AST 16 09/26/2020 0713   ALT 24 09/26/2020 0713   ALKPHOS 36 (L) 09/26/2020 0713   BILITOT 0.7 09/26/2020 0713   GFRNONAA 58 (L) 09/28/2020 0456   GFRAA 31 (L) 10/20/2019 1419       Component Value Date/Time   WBC 11.0 (H) 09/28/2020 0456   RBC 4.69 09/28/2020 0456   HGB 14.0 09/28/2020 0456   HCT 46.2 09/28/2020 0456   PLT 240 09/28/2020 0456   MCV 98.5 09/28/2020 0456   MCH 29.9 09/28/2020 0456   MCHC 30.3 09/28/2020 0456   RDW 13.4 09/28/2020 0456   LYMPHSABS 2.7 10/07/2019 1055   MONOABS 1.0 10/07/2019 1055   EOSABS 0.3 10/07/2019 1055   BASOSABS 0.2 (H) 10/07/2019 1055    No results found for: "POCLITH", "LITHIUM"   No results found for: "PHENYTOIN", "PHENOBARB", "VALPROATE", "CBMZ"   .res Assessment: Plan:   33 minutes spent dedicated to the care of this patient on the date of this encounter to include pre-visit review of records, ordering of medication,  post visit documentation, and face-to-face time with the patient discussing recent mood and anxiety symptoms. He reports that anxiety has been well controlled and he is seldom taking Klonopin prn. He reports that he does not need a new script for Klonopin at this time.  Will continue Klonopin 0.5 mg twice daily as needed for anxiety.  Continue Wellbutrin XL 300 mg daily for depression.  Recommended following up with medical providers regarding recent dizziness.  Pt to follow-up in 3 months or sooner if clinically indicated.  Patient advised to contact office with any questions, adverse effects, or acute worsening in signs and symptoms.  Avier was seen today for follow-up.  Diagnoses and all orders for this visit:  Recurrent major depressive disorder, in partial remission (HCC) -     buPROPion (WELLBUTRIN XL) 300 MG 24 hr tablet; Take 1 tablet (300 mg total) by mouth daily.  Generalized anxiety disorder  Panic disorder  Please see After Visit Summary for patient specific instructions.  Future Appointments  Date Time Provider Department Center  08/14/2023 11:30 AM [facility] - ECHO 1 OUTPATIENT [facility]    No orders of the defined types were placed in this encounter.     -------------------------------

## 2023-08-14 ENCOUNTER — Ambulatory Visit (HOSPITAL_COMMUNITY): Admission: RE | Admit: 2023-08-14 | Payer: Medicare Other | Source: Ambulatory Visit

## 2023-10-02 ENCOUNTER — Encounter: Payer: Self-pay | Admitting: Psychiatry

## 2023-10-07 ENCOUNTER — Ambulatory Visit (HOSPITAL_COMMUNITY): Payer: Medicare Other

## 2023-10-28 ENCOUNTER — Ambulatory Visit (HOSPITAL_BASED_OUTPATIENT_CLINIC_OR_DEPARTMENT_OTHER): Payer: Medicare Other

## 2023-10-28 DIAGNOSIS — I2510 Atherosclerotic heart disease of native coronary artery without angina pectoris: Secondary | ICD-10-CM

## 2023-10-28 DIAGNOSIS — E782 Mixed hyperlipidemia: Secondary | ICD-10-CM

## 2023-10-28 DIAGNOSIS — I10 Essential (primary) hypertension: Secondary | ICD-10-CM | POA: Diagnosis not present

## 2023-10-28 DIAGNOSIS — G4733 Obstructive sleep apnea (adult) (pediatric): Secondary | ICD-10-CM | POA: Diagnosis not present

## 2023-10-28 DIAGNOSIS — I214 Non-ST elevation (NSTEMI) myocardial infarction: Secondary | ICD-10-CM

## 2023-10-28 DIAGNOSIS — I252 Old myocardial infarction: Secondary | ICD-10-CM

## 2023-10-28 DIAGNOSIS — I5189 Other ill-defined heart diseases: Secondary | ICD-10-CM

## 2023-10-29 LAB — ECHOCARDIOGRAM COMPLETE
Area-P 1/2: 3.12 cm2
Calc EF: 50.5 %
S' Lateral: 2.77 cm
Single Plane A2C EF: 42.6 %
Single Plane A4C EF: 55.3 %

## 2023-11-04 ENCOUNTER — Other Ambulatory Visit: Payer: Self-pay | Admitting: Psychiatry

## 2023-11-04 DIAGNOSIS — F410 Panic disorder [episodic paroxysmal anxiety] without agoraphobia: Secondary | ICD-10-CM

## 2023-11-04 DIAGNOSIS — F411 Generalized anxiety disorder: Secondary | ICD-10-CM

## 2024-01-24 ENCOUNTER — Ambulatory Visit: Payer: Medicare Other | Admitting: Physician Assistant

## 2024-02-05 ENCOUNTER — Encounter: Payer: Self-pay | Admitting: Physician Assistant

## 2024-02-05 ENCOUNTER — Ambulatory Visit: Payer: Medicare Other | Attending: Physician Assistant | Admitting: Physician Assistant

## 2024-02-05 VITALS — BP 142/64 | HR 78 | Ht 71.0 in | Wt 324.2 lb

## 2024-02-05 DIAGNOSIS — I2510 Atherosclerotic heart disease of native coronary artery without angina pectoris: Secondary | ICD-10-CM | POA: Insufficient documentation

## 2024-02-05 DIAGNOSIS — I5189 Other ill-defined heart diseases: Secondary | ICD-10-CM | POA: Diagnosis not present

## 2024-02-05 DIAGNOSIS — I10 Essential (primary) hypertension: Secondary | ICD-10-CM | POA: Insufficient documentation

## 2024-02-05 DIAGNOSIS — E782 Mixed hyperlipidemia: Secondary | ICD-10-CM | POA: Diagnosis present

## 2024-02-05 NOTE — Progress Notes (Signed)
 Cardiology Office Note   Date:  02/05/2024  ID:  Sergio Griffin, DOB 09-May-1951, MRN [id number] PCP:  Romeo Rabon, MD [facility] Cardiologist: Nanetta Batty, MD  Reason for visit: 70-month follow-up  History of Present Illness    Sergio Griffin is a 73 y.o. male with a hx of hypertension, hyperlipidemia, family history of CAD, prior tobacco use, diastolic dysfunction, CAD with prior stenting, sleep apnea intolerant to CPAP and BiPAP.  He last saw Dr. Allyson Sabal in August 2024.  He complained of shortness of breath thought related to his obesity.  Patient has chronic back pain and walks with a rollator.  No change in therapy.  Recommend follow-up in 6 months.  Today, he comes in feeling well.  Patient states his prior angina with his CAD included chest pain rating to his arm, another time it was bilateral shoulder pain.  He states no recent angina.  He denies chest pain and shortness of breath.  He states he is doing well from a fluid standpoint.  He takes torsemide 20 mg every other day.  He felt like when he was taking it daily, it caused him lightheadedness.  He has not noticed increase in fluid retention on taking every other day.  He states blood pressure at home averaging 110/70.  He thinks his blood pressure is running higher today as he was in a rush stuck in traffic.  Patient states he is retired.  He stays busy reading, gardening and playing the piano.  He does note some limit on activity as he gets back pain when walking a distance.  He is hoping to lose weight.  He states last time he stopped drinking diet sodas he lost 46 pounds.  He plans to stop drinking diet sodas by the end of the month.  He mentions he became more sedentary when he spent 7 years caring for his mother and brother.  He now has time to himself over the last year and a half.  When reviewed his medications, he states he stopped taking Lipitor as he has a concern for memory loss.  He states his dad had memory  loss with Lipitor.  He is open to seeing the lipid pharmacist to discuss alternatives.  Patient denies bleeding issues taking Effient and a baby aspirin.   Objective / Physical Exam   EKG today: Normal sinus rhythm, heart rate 78, left posterior fascicular block  Vital signs:  BP (!) 142/64   Pulse 78   Ht 5\' 11"  (1.803 m)   Wt (!) 324 lb 3.2 oz (147.1 kg)   SpO2 94%   BMI 45.22 kg/m     GEN: No acute distress, morbidly obese NECK: No carotid bruits CARDIAC: RRR, no murmurs RESPIRATORY:  Clear to auscultation without rales, wheezing or rhonchi  EXTREMITIES: 1-2+ lower extremity edema to shins bilaterally, discoloration secondary to venous stasis  Assessment and Plan   Coronary artery disease, no angina -status post multiple stents in the past beginning in 2008 and recurrent stenting in 2019 or 2020.  -With a history of in-stent restenosis, continue Effient and aspirin 81 mg daily.  No bleeding issues. -Continue Toprol-XL 25 mg daily. -Refer to lipid pharmacist for lipid therapy to prevent plaque progression. -Patient mentions scheduled oral surgery for next month.  Okay to hold Effient for 5 days prior to her procedure.  Patient has no anginal symptoms, and no intervention in the past year.  Diastolic dysfunction, euvolemic Chronic bilateral lower extremity edema -Patient is  near euvolemic.  He has bilateral venous stasis, his rate of venous insufficiency and vein ablations. -Continue torsemide 20 mg every other day.  Weight loss recommended.  Hypertension, well-controlled at home -Continue amlodipine 5 mg daily, losartan 100 mg daily. -Goal BP is <130/80.  Recommend DASH diet (high in vegetables, fruits, low-fat dairy products, whole grains, poultry, fish, and nuts and low in sweets, sugar-sweetened beverages, and red meats), salt restriction and increase physical activity.  Hyperlipidemia with goal LDL less than 70 -Patient states lipids followed by PCP in [place] -Stopped  taking Lipitor secondary to concerns of memory issues -Recommend he follow-up with a lipid pharmacist to discuss alternatives and bring a lipid results with him. -Recommend cholesterol lowering diets - Mediterranean diet, DASH diet, vegetarian diet, low-carbohydrate diet and avoidance of trans fats.  Discussed healthier choice substitutes.  Nuts, high-fiber foods, and fiber supplements may also improve lipids.    Obesity -Even a 5-10% weight loss can have cardiovascular benefits.   -Recommend moderate intensity activity for 30 minutes 5 days/week and the DASH diet.  Obstructive sleep apnea -Intolerant to CPAP and BiPAP  Disposition - Follow-up in 6 months.    Signed, Cannon Kettle, PA-C  02/05/2024 [facility]

## 2024-02-05 NOTE — Patient Instructions (Signed)
 Medication Instructions:  No Changes *If you need a refill on your cardiac medications before your next appointment, please call your pharmacy*   Lab Work: No Labs If you have labs (blood work) drawn today and your tests are completely normal, you will receive your results only by: MyChart Message (if you have MyChart) OR A paper copy in the mail If you have any lab test that is abnormal or we need to change your treatment, we will call you to review the results.   Testing/Procedures: No Testing   Follow-Up: At [facility], you and your health needs are our priority.  As part of our continuing mission to provide you with exceptional heart care, we have created designated Provider Care Teams.  These Care Teams include your primary Cardiologist (physician) and Advanced Practice Providers (APPs -  Physician Assistants and Nurse Practitioners) who all work together to provide you with the care you need, when you need it.  We recommend signing up for the patient portal called "MyChart".  Sign up information is provided on this After Visit Summary.  MyChart is used to connect with patients for Virtual Visits (Telemedicine).  Patients are able to view lab/test results, encounter notes, upcoming appointments, etc.  Non-urgent messages can be sent to your provider as well.   To learn more about what you can do with MyChart, go to [email or web address].    Your next appointment:   6 month(s)  Provider:   Nanetta Batty, MD    Other Instructions   1st Floor: - Lobby - Registration  - Pharmacy  - Lab - Cafe  2nd Floor: - PV Lab - Diagnostic Testing (echo, CT, nuclear med)  3rd Floor: - Vacant  4th Floor: - TCTS (cardiothoracic surgery) - AFib Clinic - Structural Heart Clinic - Vascular Surgery  - Vascular Ultrasound  5th Floor: - HeartCare Cardiology (general and EP) - Clinical Pharmacy for coumadin, hypertension, lipid, weight-loss medications, and med  management appointments    Valet parking services will be available as well.

## 2024-03-29 ENCOUNTER — Observation Stay (HOSPITAL_COMMUNITY)
Admission: EM | Admit: 2024-03-29 | Discharge: 2024-03-31 | Disposition: A | Discharge status: Home / Self Care | Attending: Internal Medicine | Admitting: Internal Medicine

## 2024-03-29 ENCOUNTER — Other Ambulatory Visit: Payer: Self-pay

## 2024-03-29 ENCOUNTER — Emergency Department (HOSPITAL_COMMUNITY)

## 2024-03-29 ENCOUNTER — Encounter (HOSPITAL_COMMUNITY): Payer: Self-pay | Admitting: *Deleted

## 2024-03-29 DIAGNOSIS — Z7982 Long term (current) use of aspirin: Secondary | ICD-10-CM | POA: Insufficient documentation

## 2024-03-29 DIAGNOSIS — E119 Type 2 diabetes mellitus without complications: Secondary | ICD-10-CM | POA: Diagnosis not present

## 2024-03-29 DIAGNOSIS — I2510 Atherosclerotic heart disease of native coronary artery without angina pectoris: Secondary | ICD-10-CM | POA: Diagnosis not present

## 2024-03-29 DIAGNOSIS — E785 Hyperlipidemia, unspecified: Secondary | ICD-10-CM | POA: Insufficient documentation

## 2024-03-29 DIAGNOSIS — I10 Essential (primary) hypertension: Secondary | ICD-10-CM | POA: Diagnosis present

## 2024-03-29 DIAGNOSIS — E1122 Type 2 diabetes mellitus with diabetic chronic kidney disease: Secondary | ICD-10-CM | POA: Insufficient documentation

## 2024-03-29 DIAGNOSIS — K922 Gastrointestinal hemorrhage, unspecified: Secondary | ICD-10-CM | POA: Diagnosis not present

## 2024-03-29 DIAGNOSIS — Z7984 Long term (current) use of oral hypoglycemic drugs: Secondary | ICD-10-CM | POA: Diagnosis not present

## 2024-03-29 DIAGNOSIS — Z87891 Personal history of nicotine dependence: Secondary | ICD-10-CM | POA: Diagnosis not present

## 2024-03-29 DIAGNOSIS — I130 Hypertensive heart and chronic kidney disease with heart failure and stage 1 through stage 4 chronic kidney disease, or unspecified chronic kidney disease: Secondary | ICD-10-CM | POA: Diagnosis not present

## 2024-03-29 DIAGNOSIS — I5032 Chronic diastolic (congestive) heart failure: Secondary | ICD-10-CM | POA: Diagnosis not present

## 2024-03-29 DIAGNOSIS — N1831 Chronic kidney disease, stage 3a: Secondary | ICD-10-CM | POA: Insufficient documentation

## 2024-03-29 DIAGNOSIS — K625 Hemorrhage of anus and rectum: Secondary | ICD-10-CM | POA: Diagnosis present

## 2024-03-29 DIAGNOSIS — Z79899 Other long term (current) drug therapy: Secondary | ICD-10-CM | POA: Insufficient documentation

## 2024-03-29 DIAGNOSIS — E662 Morbid (severe) obesity with alveolar hypoventilation: Secondary | ICD-10-CM | POA: Diagnosis not present

## 2024-03-29 DIAGNOSIS — N183 Chronic kidney disease, stage 3 unspecified: Secondary | ICD-10-CM | POA: Diagnosis present

## 2024-03-29 DIAGNOSIS — I5189 Other ill-defined heart diseases: Secondary | ICD-10-CM

## 2024-03-29 LAB — CBC
HCT: 47.1 % (ref 39.0–52.0)
Hemoglobin: 15.1 g/dL (ref 13.0–17.0)
MCH: 29.7 pg (ref 26.0–34.0)
MCHC: 32.1 g/dL (ref 30.0–36.0)
MCV: 92.7 fL (ref 80.0–100.0)
Platelets: 246 10*3/uL (ref 150–400)
RBC: 5.08 MIL/uL (ref 4.22–5.81)
RDW: 13.7 % (ref 11.5–15.5)
WBC: 11.2 10*3/uL — ABNORMAL HIGH (ref 4.0–10.5)
nRBC: 0 % (ref 0.0–0.2)

## 2024-03-29 LAB — COMPREHENSIVE METABOLIC PANEL WITH GFR
ALT: 22 U/L (ref 0–44)
AST: 22 U/L (ref 15–41)
Albumin: 3.8 g/dL (ref 3.5–5.0)
Alkaline Phosphatase: 48 U/L (ref 38–126)
Anion gap: 10 (ref 5–15)
BUN: 26 mg/dL — ABNORMAL HIGH (ref 8–23)
CO2: 24 mmol/L (ref 22–32)
Calcium: 9.4 mg/dL (ref 8.9–10.3)
Chloride: 105 mmol/L (ref 98–111)
Creatinine, Ser: 1.29 mg/dL — ABNORMAL HIGH (ref 0.61–1.24)
GFR, Estimated: 58 mL/min — ABNORMAL LOW (ref >60)
Glucose, Bld: 140 mg/dL — ABNORMAL HIGH (ref 70–99)
Potassium: 4.5 mmol/L (ref 3.5–5.1)
Sodium: 139 mmol/L (ref 135–145)
Total Bilirubin: 0.6 mg/dL (ref 0.0–1.2)
Total Protein: 7.1 g/dL (ref 6.5–8.1)

## 2024-03-29 LAB — PROTIME-INR
INR: 0.9 (ref 0.8–1.2)
Prothrombin Time: 12.2 s (ref 11.4–15.2)

## 2024-03-29 MED ORDER — PANTOPRAZOLE SODIUM 40 MG IV SOLR: 40.0000 mg | Freq: Two times a day (BID) | INTRAVENOUS | Status: DC

## 2024-03-29 MED ORDER — IOHEXOL 350 MG/ML SOLN
100.0000 mL | Freq: Once | INTRAVENOUS | Status: AC | PRN
  Administered 2024-03-29: 100 mL via INTRAVENOUS

## 2024-03-29 MED ORDER — PANTOPRAZOLE SODIUM 40 MG IV SOLR
40.0000 mg | INTRAVENOUS | Status: AC
  Administered 2024-03-29 (×2): 40 mg via INTRAVENOUS
  Filled 2024-03-29 (×2): qty 10

## 2024-03-29 MED ORDER — ONDANSETRON HCL 4 MG/2ML IJ SOLN
4.0000 mg | Freq: Once | INTRAMUSCULAR | Status: AC
  Administered 2024-03-29: 4 mg via INTRAVENOUS
  Filled 2024-03-29: qty 2

## 2024-03-29 MED ORDER — MORPHINE SULFATE (PF) 4 MG/ML IV SOLN
4.0000 mg | Freq: Once | INTRAVENOUS | Status: AC
  Administered 2024-03-29: 4 mg via INTRAVENOUS
  Filled 2024-03-29: qty 1

## 2024-03-29 NOTE — ED Notes (Signed)
 Patient transported to CT

## 2024-03-29 NOTE — ED Provider Notes (Signed)
  Provider Note MRN:  [id number]  Arrival date & time: 03/30/24    ED Course and Medical Decision Making  Assumed care of patient at sign-out or upon transfer.  Persistent rectal bleeding, lower abdominal pain, on Brilinta, CT without signs of active bleeding source, suspect diverticular bleed.  Reaching out to GI, anticipating admission.  Patient having episodes of hematochezia here in the emergency department.  Procedures  Final Clinical Impressions(s) / ED Diagnoses     ICD-10-CM   1. Lower GI bleed  K92.2       ED Discharge Orders     None       Discharge Instructions   None     Merrick Abe. Harless Lien, MD [facility] Emergency Medicine [facility] [email or web address]    Edson Graces, MD 03/30/24 [phone]

## 2024-03-29 NOTE — ED Triage Notes (Signed)
 Pt with rectal bleeding since Thursday, pt with hx of hemorrhoids. Pt noted bright red blood in stools and clots.

## 2024-03-29 NOTE — ED Provider Notes (Cosign Needed Addendum)
 [place] [facility] AT [facility] Provider Note   CSN: [id number] Arrival date & time: 03/29/24  2001     History  Chief Complaint  Patient presents with   Rectal Bleeding    Sergio Griffin is a 73 y.o. male.  History of CAD, GERD, gastric ulcer, Barrett's esophagus, diverticulitis, HTN.    He is here  today for complaint of rectal bleeding.  He states he had a couple episodes of blood mixed with stool 4 days ago, and then 1 more episode the next day.  Has not had blood in his stool until today after eating lunch, when he started having frequent bloody bowel movements.  He is reporting a small amount of stool  mixed with bright red blood and dark red clots.  The volume has been between approximately half a cup and a cup each time.  He is also having lower abdominal pain described as constant and like squeezing.  He denies fever or chills,  urinary symptoms,  chest pain and shortness of breath.   He is on Effient  due to history of cardiac stents.   He reports he has had history of diverticulitis in the past as well.  He states that he last time he had GI bleeding, he had to be treated in the hospital for colitis. This in the setting of forcefully using an enema in 2016, and he had to be hospitalized for a month for colitis.  He also reports history of gastric ulcer secondary to H. pylori which has been treated.  He denies upper abdominal pain, nausea or vomiting.   Rectal Bleeding      Home Medications Prior to Admission medications   Medication Sig Start Date End Date Taking? Authorizing Provider  acetaminophen  (TYLENOL ) 500 MG tablet Take 500-1,000 mg by mouth every 6 (six) hours as needed for moderate pain (pain score 4-6) or headache.    [provider]  amLODipine  (NORVASC ) 5 MG tablet Take 5 mg by mouth daily. 10/18/20   [provider]  ammonium lactate (AMLACTIN) 12 % cream Apply topically 2 (two) times daily. 10/15/20   [provider]  aspirin  EC 81 MG tablet Take 81 mg by mouth at bedtime.     [provider]  atorvastatin (LIPITOR) 10 MG tablet Take 1 tablet by mouth daily. Patient not taking: Reported on 02/05/2024    [provider]  baclofen (LIORESAL) 10 MG tablet Take 10 mg by mouth 2 (two) times daily as needed.    [provider]  buPROPion  (WELLBUTRIN  XL) 300 MG 24 hr tablet Take 1 tablet (300 mg total) by mouth daily. Patient not taking: Reported on 02/05/2024 08/06/23   Roselyn Connor, PMHNP  Carboxymethylcellul-Glycerin (LUBRICATING EYE DROPS OP) Place 1 drop into both eyes daily as needed (dry eyes).    [provider]  clonazePAM  (KLONOPIN ) 0.5 MG tablet Take 1 tablet by mouth twice daily as needed for anxiety Patient not taking: Reported on 02/05/2024 11/04/23   Roselyn Connor, PMHNP  DM-APAP-CPM (CORICIDIN HBP PO) Take 1 tablet by mouth daily as needed (severe allergies).    [provider]  FEROSUL 325 (65 Fe) MG tablet Take 325 mg by mouth daily. Patient not taking: Reported on 02/05/2024 10/01/22   [provider]  JANUVIA 100 MG tablet Take 1 tablet every day by oral route. Patient not taking: Reported on 02/05/2024 10/28/23   [provider]  ketoconazole (NIZORAL) 2 % shampoo Apply topically. 10/15/20  [provider]  latanoprost  (XALATAN ) 0.005 % ophthalmic solution Place 1 drop into both eyes at bedtime. 05/03/15   [provider]  losartan  (COZAAR ) 50 MG tablet Take 100 mg by mouth daily.     [provider]  metoprolol  succinate (TOPROL -XL) 25 MG 24 hr tablet Take 12.5-25 mg by mouth See admin instructions. Take 25 mg in the morning and 12.5 mg in the evening    [provider]  miconazole  (MICONAZOLE  ANTIFUNGAL) 2 % cream Apply 1 application topically 2 (two) times daily. For 4-6 weeks 11/22/21   Nandigam, Kavitha V, MD  Multiple Vitamin (MULTIVITAMIN) tablet Take 1 tablet by mouth daily.     [provider]  mupirocin ointment (BACTROBAN) 2 % Apply topically. 10/25/23   [provider]  nitroGLYCERIN  (NITROSTAT ) 0.4 MG SL tablet Place 1 tablet (0.4 mg total) under the tongue every 5 (five) minutes x 3 doses as needed for chest pain. 06/30/15   Love, Renay Carota, PA-C  ofloxacin (OCUFLOX) 0.3 % ophthalmic solution Place 1 drop into the right eye 3 (three) times daily. Patient not taking: Reported on 02/05/2024 11/03/21   [provider]  Omega-3 Fatty Acids (FISH OIL) 1000 MG CAPS Take 1,000 mg by mouth daily.    [provider]  ondansetron  (ZOFRAN ) 4 MG tablet Take 4 mg by mouth every 8 (eight) hours as needed for nausea or vomiting. Patient not taking: Reported on 07/17/2023    [provider]  ondansetron  (ZOFRAN -ODT) 4 MG disintegrating tablet DISSOLVE 1 TABLET IN MOUTH EVERY 8 HOURS AS NEEDED FOR NAUSEA FOR VOMITING 01/09/22   Nandigam, Kavitha V, MD  prasugrel  (EFFIENT ) 10 MG TABS tablet Take 1 tablet (10 mg total) by mouth at bedtime. Resume 48 hours after surgery 10/22/19   Andrez Banker, MD  silodosin (RAPAFLO) 4 MG CAPS capsule Take 1 capsule every day by oral route at bedtime for 90 days. 10/24/23   [provider]  Sorbitol  SOLN Take 30 mLs by mouth daily as needed. For constipation Patient not taking: Reported on 02/05/2024 06/30/21   Lajuan Pila, MD  sucralfate  (CARAFATE ) 1 g tablet Take 1 tablet (1 g total) by mouth at bedtime. 09/28/20   Johnson, Clanford L, MD  torsemide (DEMADEX) 20 MG tablet Take 20 mg by mouth 2 (two) times daily.    [provider]  Vitamin D, Ergocalciferol, (DRISDOL) 1.25 MG (50000 UNIT) CAPS capsule Take 50,000 Units by mouth once a week. 12/25/22   [provider]      Allergies    Tamsulosin, Ciprofloxacin , Ibuprofen, Propoxyphene, and Sulfa antibiotics    Review of Systems   Review of Systems  Gastrointestinal:  Positive for hematochezia.    Physical Exam Updated Vital  Signs BP (!) 115/56   Pulse 70   Temp 98.1 F (36.7 C)   Resp 18   Ht 5\' 11"  (1.803 m)   Wt (!) 142.4 kg   SpO2 92%   BMI 43.79 kg/m  Physical Exam  ED Results / Procedures / Treatments   Labs (all labs ordered are listed, but only abnormal results are displayed) Labs Reviewed  COMPREHENSIVE METABOLIC PANEL WITH GFR - Abnormal; Notable for the following components:      Result Value   Glucose, Bld 140 (*)    BUN 26 (*)    Creatinine, Ser 1.29 (*)    GFR, Estimated 58 (*)    All other components within normal limits  CBC - Abnormal; Notable for  the following components:   WBC 11.2 (*)    All other components within normal limits  PROTIME-INR    EKG None  Radiology CT Angio Abd/Pel W and/or Wo Contrast Result Date: 03/29/2024 CLINICAL DATA:  Rectal bleeding, hemorrhoids EXAM: CTA ABDOMEN AND PELVIS WITHOUT AND WITH CONTRAST TECHNIQUE: Multidetector CT imaging of the abdomen and pelvis was performed using the standard protocol during bolus administration of intravenous contrast. Multiplanar reconstructed images and MIPs were obtained and reviewed to evaluate the vascular anatomy. RADIATION DOSE REDUCTION: This exam was performed according to the departmental dose-optimization program which includes automated exposure control, adjustment of the mA and/or kV according to patient size and/or use of iterative reconstruction technique. CONTRAST:  OMNIPAQUE  IOHEXOL  350 MG/ML SOLN COMPARISON:  05/07/2023 FINDINGS: VASCULAR Normal contour and caliber of the abdominal aorta. No evidence of aneurysm, dissection, or other acute aortic pathology. Standard branching pattern of the abdominal aorta with solitary bilateral renal arteries. Mild aortic atherosclerosis. Review of the MIP images confirms the above findings. NON-VASCULAR Lower Chest: No acute findings. Hepatobiliary: No solid liver abnormality is seen. No gallstones, gallbladder wall thickening, or biliary dilatation. Pancreas:  Unremarkable. No pancreatic ductal dilatation or surrounding inflammatory changes. Spleen: Normal in size without significant abnormality. Adrenals/Urinary Tract: Adrenal glands are unremarkable. Nonobstructive bilateral nephrolithiasis. No hydronephrosis. Bladder is unremarkable. Stomach/Bowel: Stomach is within normal limits. Appendix not clearly visualized. No evidence of bowel wall thickening, distention, or inflammatory changes. Descending and sigmoid diverticulosis. No intraluminal contrast extravasation or other findings to specifically localize GI bleeding. Lymphatic: No enlarged abdominal or pelvic lymph nodes. Reproductive: No mass or other significant abnormality. Other: No abdominal wall hernia or abnormality. No ascites. Musculoskeletal: No acute osseous findings. IMPRESSION: 1. No intraluminal contrast extravasation or other findings to specifically localize GI bleeding. 2. Descending and sigmoid diverticulosis without evidence of acute diverticulitis. 3. Normal contour and caliber of the abdominal aorta. No evidence of aneurysm, dissection, or other acute aortic pathology. Mild aortic atherosclerosis. 4. Nonobstructive bilateral nephrolithiasis. Aortic Atherosclerosis (ICD10-I70.0). Electronically Signed   By: Fredricka Jenny M.D.   On: 03/29/2024 22:19    Procedures Procedures    Medications Ordered in ED Medications  pantoprazole  (PROTONIX ) injection 40 mg (40 mg Intravenous Given 03/29/24 2059)    Followed by  pantoprazole  (PROTONIX ) injection 40 mg (has no administration in time range)  iohexol  (OMNIPAQUE ) 350 MG/ML injection 100 mL (100 mLs Intravenous Contrast Given 03/29/24 2105)  morphine  (PF) 4 MG/ML injection 4 mg (4 mg Intravenous Given 03/29/24 2133)  ondansetron  (ZOFRAN ) injection 4 mg (4 mg Intravenous Given 03/29/24 2133)    ED Course/ Medical Decision Making/ A&P                                 Medical Decision Making Differential diagnose includes but not limited to  hemorrhoids, diverticulitis, ulcer, varices, other  ED course: Patient presents the ER for lower GI bleeding.  Started a few days ago and then resolved and returned today, he said about 6 episodes of bloody stool today including an episode in the ED, still having the urge to have a bowel movement.  He is passing frank blood.  Hemoglobin is stable here, CT does not show any source of bleeding, patient is on Brilinta, having continued bleeding.  I consulted GI, still pending callback, will plan on admission for GI bleeding.  Signed out to Dr. Harless Lien  Amount and/or Complexity of Data Reviewed  External Data Reviewed: labs and notes. Labs: ordered. Radiology: ordered.  Risk Prescription drug management.           Final Clinical Impression(s) / ED Diagnoses Final diagnoses:  Lower GI bleed    Rx / DC Orders ED Discharge Orders     None         Aimee Houseman, PA-C 03/29/24 2328    Sofie Schendel A, PA-C 03/29/24 2328    Merdis Stalling, MD 03/30/24 [id number]

## 2024-03-30 ENCOUNTER — Encounter (HOSPITAL_COMMUNITY): Payer: Self-pay | Admitting: Family Medicine

## 2024-03-30 DIAGNOSIS — E119 Type 2 diabetes mellitus without complications: Secondary | ICD-10-CM | POA: Diagnosis not present

## 2024-03-30 DIAGNOSIS — Z860102 Personal history of hyperplastic colon polyps: Secondary | ICD-10-CM

## 2024-03-30 DIAGNOSIS — Z860101 Personal history of adenomatous and serrated colon polyps: Secondary | ICD-10-CM | POA: Diagnosis not present

## 2024-03-30 DIAGNOSIS — N1831 Chronic kidney disease, stage 3a: Secondary | ICD-10-CM | POA: Diagnosis not present

## 2024-03-30 DIAGNOSIS — K922 Gastrointestinal hemorrhage, unspecified: Secondary | ICD-10-CM | POA: Diagnosis not present

## 2024-03-30 DIAGNOSIS — E66813 Obesity, class 3: Secondary | ICD-10-CM

## 2024-03-30 DIAGNOSIS — K625 Hemorrhage of anus and rectum: Secondary | ICD-10-CM | POA: Diagnosis present

## 2024-03-30 DIAGNOSIS — I5189 Other ill-defined heart diseases: Secondary | ICD-10-CM | POA: Diagnosis not present

## 2024-03-30 LAB — CBG MONITORING, ED
Glucose-Capillary: 108 mg/dL — ABNORMAL HIGH (ref 70–99)
Glucose-Capillary: 111 mg/dL — ABNORMAL HIGH (ref 70–99)
Glucose-Capillary: 120 mg/dL — ABNORMAL HIGH (ref 70–99)
Glucose-Capillary: 145 mg/dL — ABNORMAL HIGH (ref 70–99)

## 2024-03-30 LAB — CBC
HCT: 45.6 % (ref 39.0–52.0)
HCT: 45 % (ref 39.0–52.0)
HCT: 42.8 % (ref 39.0–52.0)
Hemoglobin: 14.1 g/dL (ref 13.0–17.0)
Hemoglobin: 14 g/dL (ref 13.0–17.0)
Hemoglobin: 13.3 g/dL (ref 13.0–17.0)
MCH: 29.7 pg (ref 26.0–34.0)
MCH: 29.8 pg (ref 26.0–34.0)
MCH: 29.6 pg (ref 26.0–34.0)
MCHC: 30.9 g/dL (ref 30.0–36.0)
MCHC: 31.1 g/dL (ref 30.0–36.0)
MCHC: 31.1 g/dL (ref 30.0–36.0)
MCV: 96 fL (ref 80.0–100.0)
MCV: 95.7 fL (ref 80.0–100.0)
MCV: 95.3 fL (ref 80.0–100.0)
Platelets: 224 10*3/uL (ref 150–400)
Platelets: 225 10*3/uL (ref 150–400)
Platelets: 227 10*3/uL (ref 150–400)
RBC: 4.75 MIL/uL (ref 4.22–5.81)
RBC: 4.7 MIL/uL (ref 4.22–5.81)
RBC: 4.49 MIL/uL (ref 4.22–5.81)
RDW: 13.9 % (ref 11.5–15.5)
RDW: 14 % (ref 11.5–15.5)
RDW: 13.8 % (ref 11.5–15.5)
WBC: 11.1 10*3/uL — ABNORMAL HIGH (ref 4.0–10.5)
WBC: 10.3 10*3/uL (ref 4.0–10.5)
WBC: 11.4 10*3/uL — ABNORMAL HIGH (ref 4.0–10.5)
nRBC: 0 % (ref 0.0–0.2)
nRBC: 0 % (ref 0.0–0.2)
nRBC: 0 % (ref 0.0–0.2)

## 2024-03-30 LAB — BASIC METABOLIC PANEL WITH GFR
Anion gap: 7 (ref 5–15)
BUN: 24 mg/dL — ABNORMAL HIGH (ref 8–23)
CO2: 29 mmol/L (ref 22–32)
Calcium: 8.8 mg/dL — ABNORMAL LOW (ref 8.9–10.3)
Chloride: 103 mmol/L (ref 98–111)
Creatinine, Ser: 1.39 mg/dL — ABNORMAL HIGH (ref 0.61–1.24)
GFR, Estimated: 53 mL/min — ABNORMAL LOW (ref >60)
Glucose, Bld: 142 mg/dL — ABNORMAL HIGH (ref 70–99)
Potassium: 5.3 mmol/L — ABNORMAL HIGH (ref 3.5–5.1)
Sodium: 139 mmol/L (ref 135–145)

## 2024-03-30 LAB — HEMOGLOBIN A1C
Hgb A1c MFr Bld: 6.9 % — ABNORMAL HIGH (ref 4.8–5.6)
Mean Plasma Glucose: 151 mg/dL

## 2024-03-30 LAB — GLUCOSE, CAPILLARY
Glucose-Capillary: 150 mg/dL — ABNORMAL HIGH (ref 70–99)
Glucose-Capillary: 144 mg/dL — ABNORMAL HIGH (ref 70–99)

## 2024-03-30 MED ORDER — SODIUM CHLORIDE 0.9 % IV SOLN: INTRAVENOUS | Status: DC

## 2024-03-30 MED ORDER — INSULIN ASPART 100 UNIT/ML IJ SOLN
0.0000 [IU] | INTRAMUSCULAR | Status: DC
  Administered 2024-03-31: 1 [IU] via SUBCUTANEOUS

## 2024-03-30 MED ORDER — TAMSULOSIN HCL 0.4 MG PO CAPS: 0.4000 mg | ORAL_CAPSULE | Freq: Every day | ORAL | Status: DC

## 2024-03-30 MED ORDER — PROCHLORPERAZINE EDISYLATE 10 MG/2ML IJ SOLN: 5.0000 mg | Freq: Four times a day (QID) | INTRAMUSCULAR | Status: DC | PRN

## 2024-03-30 MED ORDER — ACETAMINOPHEN 325 MG PO TABS
650.0000 mg | ORAL_TABLET | Freq: Four times a day (QID) | ORAL | Status: DC | PRN
  Administered 2024-03-31 (×2): 650 mg via ORAL
  Filled 2024-03-30 (×2): qty 2

## 2024-03-30 MED ORDER — ATORVASTATIN CALCIUM 10 MG PO TABS
10.0000 mg | ORAL_TABLET | Freq: Every day | ORAL | Status: DC
  Filled 2024-03-30 (×2): qty 1

## 2024-03-30 MED ORDER — FENTANYL CITRATE PF 50 MCG/ML IJ SOSY
12.5000 ug | PREFILLED_SYRINGE | INTRAMUSCULAR | Status: DC | PRN
Start: 2024-03-30 — End: 2024-04-01
  Administered 2024-03-30: 50 ug via INTRAVENOUS
  Filled 2024-03-30: qty 1

## 2024-03-30 MED ORDER — METOPROLOL SUCCINATE ER 25 MG PO TB24
25.0000 mg | ORAL_TABLET | Freq: Every day | ORAL | Status: DC
  Administered 2024-03-30 – 2024-03-31 (×2): 25 mg via ORAL
  Filled 2024-03-30 (×2): qty 1

## 2024-03-30 MED ORDER — SODIUM CHLORIDE 0.9% FLUSH
3.0000 mL | Freq: Two times a day (BID) | INTRAVENOUS | Status: DC
  Administered 2024-03-30 – 2024-03-31 (×2): 3 mL via INTRAVENOUS

## 2024-03-30 MED ORDER — ACETAMINOPHEN 650 MG RE SUPP: 650.0000 mg | Freq: Four times a day (QID) | RECTAL | Status: DC | PRN

## 2024-03-30 NOTE — Care Management Obs Status (Signed)
 MEDICARE OBSERVATION STATUS NOTIFICATION   Patient Details  Name: Sergio Griffin MRN: [id number] Date of Birth: October 12, 1951   Medicare Observation Status Notification Given:  Yes    Geraldina Klinefelter, RN 03/30/2024, 5:01 PM

## 2024-03-30 NOTE — ED Notes (Signed)
 Pt oxygen declines while he is sleeping. Nasal cannula applied at 2lpm.

## 2024-03-30 NOTE — H&P (Signed)
 History and Physical    Sergio Griffin VZD:[id number] DOB: 1951/01/15 DOA: 03/29/2024  PCP: James Mcardle, MD   Patient coming from: Home   Chief Complaint: Rectal bleeding   HPI: Sergio Griffin is a 73 y.o. male with medical history significant for hypertension, hyperlipidemia, type 2 diabetes mellitus, CAD, CKD 3A, OSA with CPAP intolerance, history of PUD, and diverticulosis who presents with rectal bleeding.  Patient reports experiencing cramping discomfort in the lower abdomen with 2 episodes of BRBPR and passage of clots on 03/26/2024.  He did not have any abdominal discomfort or GI bleeding again until today.  He experiences same symptoms today, felt as though he was going to have diarrhea, but passed mainly bright red blood and large clots.  He denies any upper abdominal discomfort, nausea, vomiting, chest pain, or increase in his chronic leg swelling.  ED Course: Upon arrival to the ED, patient is found to be afebrile and saturating low 90s on room air with normal HR and stable BP.  Labs are most notable for creatinine 1.29, WBC 11,200, and hemoglobin 15.1.  There are no acute findings on CTA abdomen and pelvis.  GI was consulted by the ED physician and the patient was treated with morphine , Zofran , and IV Protonix .  Review of Systems:  All other systems reviewed and apart from HPI, are negative.  Past Medical History:  Diagnosis Date   Anxiety    Barrett's esophagus    CAD (coronary artery disease)    PCI, stent 2011   Cardiac arrhythmia    Chicken pox    Depression    Diverticulitis    Diverticulosis    Gastric ulcer    GERD (gastroesophageal reflux disease)    Glaucoma    Heart attack (HCC) 01/29/2019   History of myocardial infarction    Hyperlipidemia    Hypertension    Kidney stones    Kidney stones    Pneumonia 11/2014   Prostatitis    Reflux    Sleep apnea    UTI (lower urinary tract infection)     Past Surgical History:  Procedure Laterality Date    cardiac stents     last one 01/29/2019   CYSTOSCOPY/URETEROSCOPY/HOLMIUM LASER/STENT PLACEMENT Left 10/22/2019   Procedure: CYSTOSCOPY/URETEROSCOPY/HOLMIUM LASER/STENT PLACEMENT;  Surgeon: Andrez Banker, MD;  Location: [facility];  Service: Urology;  Laterality: Left;   ESOPHAGEAL MANOMETRY N/A 09/11/2017   Procedure: ESOPHAGEAL MANOMETRY (EM);  Surgeon: Sergio Dandy, MD;  Location: [facility];  Service: Endoscopy;  Laterality: N/A;   EYE SURGERY Left    x 4   EYE SURGERY Right    x 3    Social History:   reports that he has quit smoking. His smoking use included cigarettes. He has never used smokeless tobacco. He reports that he does not drink alcohol  and does not use drugs.  Allergies  Allergen Reactions   Tamsulosin Other (See Comments)    Light headed and pass out   Ciprofloxacin  Itching   Ibuprofen Diarrhea and Nausea Only   Propoxyphene Itching    Darvocet   Sulfa Antibiotics Itching    Family History  Problem Relation Age of Onset   Arthritis Mother    Hyperlipidemia Mother    Stroke Mother    Hypertension Mother    Ovarian cancer Mother    Hyperlipidemia Father    Heart disease Father    Hypertension Father    Non-Hodgkin's lymphoma Father    Hyperlipidemia Maternal Grandmother  Heart disease Maternal Grandmother    Stroke Maternal Grandmother    Hypertension Maternal Grandmother    Diabetes Maternal Grandmother    Hyperlipidemia Maternal Grandfather    Heart disease Maternal Grandfather    Hypertension Maternal Grandfather    Heart disease Paternal Grandmother    Heart disease Paternal Grandfather    Stroke Paternal Grandfather    Hypertension Paternal Grandfather    Congestive Heart Failure Brother    Colon cancer Neg Hx    Esophageal cancer Neg Hx    Pancreatic cancer Neg Hx    Stomach cancer Neg Hx    Liver disease Neg Hx      Prior to Admission medications   Medication Sig Start Date End Date Taking? Authorizing Provider   acetaminophen  (TYLENOL ) 500 MG tablet Take 500-1,000 mg by mouth every 6 (six) hours as needed for moderate pain (pain score 4-6) or headache.    [provider]  amLODipine  (NORVASC ) 5 MG tablet Take 5 mg by mouth daily. 10/18/20   [provider]  ammonium lactate (AMLACTIN) 12 % cream Apply topically 2 (two) times daily. 10/15/20   [provider]  aspirin  EC 81 MG tablet Take 81 mg by mouth at bedtime.     [provider]  atorvastatin (LIPITOR) 10 MG tablet Take 1 tablet by mouth daily. Patient not taking: Reported on 02/05/2024    [provider]  baclofen (LIORESAL) 10 MG tablet Take 10 mg by mouth 2 (two) times daily as needed.    [provider]  buPROPion  (WELLBUTRIN  XL) 300 MG 24 hr tablet Take 1 tablet (300 mg total) by mouth daily. Patient not taking: Reported on 02/05/2024 08/06/23   Roselyn Connor, PMHNP  Carboxymethylcellul-Glycerin (LUBRICATING EYE DROPS OP) Place 1 drop into both eyes daily as needed (dry eyes).    [provider]  clonazePAM  (KLONOPIN ) 0.5 MG tablet Take 1 tablet by mouth twice daily as needed for anxiety Patient not taking: Reported on 02/05/2024 11/04/23   Roselyn Connor, PMHNP  DM-APAP-CPM (CORICIDIN HBP PO) Take 1 tablet by mouth daily as needed (severe allergies).    [provider]  FEROSUL 325 (65 Fe) MG tablet Take 325 mg by mouth daily. Patient not taking: Reported on 02/05/2024 10/01/22   [provider]  JANUVIA 100 MG tablet Take 1 tablet every day by oral route. Patient not taking: Reported on 02/05/2024 10/28/23   [provider]  ketoconazole (NIZORAL) 2 % shampoo Apply topically. 10/15/20   [provider]  latanoprost  (XALATAN ) 0.005 % ophthalmic solution Place 1 drop into both eyes at bedtime. 05/03/15   [provider]  losartan  (COZAAR ) 50 MG tablet Take 100 mg by mouth daily.     [provider]  metoprolol  succinate (TOPROL -XL)  25 MG 24 hr tablet Take 12.5-25 mg by mouth See admin instructions. Take 25 mg in the morning and 12.5 mg in the evening    [provider]  miconazole  (MICONAZOLE  ANTIFUNGAL) 2 % cream Apply 1 application topically 2 (two) times daily. For 4-6 weeks 11/22/21   Nandigam, Kavitha V, MD  Multiple Vitamin (MULTIVITAMIN) tablet Take 1 tablet by mouth daily.    [provider]  mupirocin ointment (BACTROBAN) 2 % Apply topically. 10/25/23   [provider]  nitroGLYCERIN  (NITROSTAT ) 0.4 MG SL tablet Place 1 tablet (0.4 mg total) under the tongue every 5 (five) minutes x 3 doses as needed for chest pain. 06/30/15   Zelda Hickman, PA-C  ofloxacin (OCUFLOX) 0.3 % ophthalmic solution Place 1 drop into the right eye 3 (three) times daily. Patient not taking: Reported on 02/05/2024 11/03/21   [provider]  Omega-3 Fatty Acids (FISH OIL) 1000 MG CAPS Take 1,000 mg by mouth daily.    [provider]  ondansetron  (ZOFRAN ) 4 MG tablet Take 4 mg by mouth every 8 (eight) hours as needed for nausea or vomiting. Patient not taking: Reported on 07/17/2023    [provider]  ondansetron  (ZOFRAN -ODT) 4 MG disintegrating tablet DISSOLVE 1 TABLET IN MOUTH EVERY 8 HOURS AS NEEDED FOR NAUSEA FOR VOMITING 01/09/22   Nandigam, Kavitha V, MD  prasugrel  (EFFIENT ) 10 MG TABS tablet Take 1 tablet (10 mg total) by mouth at bedtime. Resume 48 hours after surgery 10/22/19   Andrez Banker, MD  silodosin (RAPAFLO) 4 MG CAPS capsule Take 1 capsule every day by oral route at bedtime for 90 days. 10/24/23   [provider]  Sorbitol  SOLN Take 30 mLs by mouth daily as needed. For constipation Patient not taking: Reported on 02/05/2024 06/30/21   Lajuan Pila, MD  sucralfate  (CARAFATE ) 1 g tablet Take 1 tablet (1 g total) by mouth at bedtime. 09/28/20   Johnson, Clanford L, MD  torsemide (DEMADEX) 20 MG tablet Take 20 mg by mouth 2 (two) times daily.    [provider]   Vitamin D, Ergocalciferol, (DRISDOL) 1.25 MG (50000 UNIT) CAPS capsule Take 50,000 Units by mouth once a week. 12/25/22   [provider]    Physical Exam: Vitals:   03/29/24 2136 03/29/24 2215 03/29/24 2300 03/29/24 2330  BP: 110/61 (!) 102/56 (!) 115/56 129/67  Pulse: 73 67 70 70  Resp: 16 18 18 18   Temp:   98.1 F (36.7 C)   TempSrc:      SpO2: 91% 91% 92% 93%  Weight:      Height:        Constitutional: NAD, calm  Eyes: PERTLA, lids and conjunctivae normal ENMT: Mucous membranes are moist. Posterior pharynx clear of any exudate or lesions.   Neck: supple, no masses  Respiratory: no wheezing, no crackles. No accessory muscle use.  Cardiovascular: S1 & S2 heard, regular rate and rhythm. No JVD. Abdomen: Soft, no guarding. Bowel sounds active.  Musculoskeletal: no clubbing / cyanosis. No joint deformity upper and lower extremities.   Skin: hyperpigmentation involving b/l lower legs in gaiter distribution. Warm, dry, well-perfused. Neurologic: CN 2-12 grossly intact. Moving all extremities. Alert and oriented.  Psychiatric: Pleasant. Cooperative.    Labs and Imaging on Admission: I have personally reviewed following labs and imaging studies  CBC: Recent Labs  Lab 03/29/24 2030  WBC 11.2*  HGB 15.1  HCT 47.1  MCV 92.7  PLT 246   Basic Metabolic Panel: Recent Labs  Lab 03/29/24 2030  NA 139  K 4.5  CL 105  CO2 24  GLUCOSE 140*  BUN 26*  CREATININE 1.29*  CALCIUM  9.4   GFR: Estimated Creatinine Clearance: 72.6 mL/min (A) (by C-G formula based on SCr of 1.29 mg/dL (H)). Liver Function Tests: Recent Labs  Lab 03/29/24 2030  AST 22  ALT 22  ALKPHOS 48  BILITOT 0.6  PROT 7.1  ALBUMIN 3.8   No results for input(s): "LIPASE", "AMYLASE" in the last 168 hours. No results for input(s): "AMMONIA" in the last 168 hours. Coagulation Profile: Recent Labs  Lab 03/29/24 2030  INR 0.9   Cardiac Enzymes: No results for input(s): "CKTOTAL", "CKMB",  "CKMBINDEX", "  TROPONINI" in the last 168 hours. BNP (last 3 results) No results for input(s): "PROBNP" in the last 8760 hours. HbA1C: No results for input(s): "HGBA1C" in the last 72 hours. CBG: No results for input(s): "GLUCAP" in the last 168 hours. Lipid Profile: No results for input(s): "CHOL", "HDL", "LDLCALC", "TRIG", "CHOLHDL", "LDLDIRECT" in the last 72 hours. Thyroid  Function Tests: No results for input(s): "TSH", "T4TOTAL", "FREET4", "T3FREE", "THYROIDAB" in the last 72 hours. Anemia Panel: No results for input(s): "VITAMINB12", "FOLATE", "FERRITIN", "TIBC", "IRON", "RETICCTPCT" in the last 72 hours. Urine analysis:    Component Value Date/Time   COLORURINE AMBER (A) 06/10/2015 1337   APPEARANCEUR HAZY (A) 06/10/2015 1337   LABSPEC >1.030 (H) 06/10/2015 1337   PHURINE 5.5 06/10/2015 1337   GLUCOSEU NEGATIVE 06/10/2015 1337   HGBUR NEGATIVE 06/10/2015 1337   BILIRUBINUR negative 11/16/2015 1436   KETONESUR 15 (A) 06/10/2015 1337   PROTEINUR negative 11/16/2015 1436   PROTEINUR 100 (A) 06/10/2015 1337   UROBILINOGEN negative 11/16/2015 1436   UROBILINOGEN 1.0 06/10/2015 1337   NITRITE negative 11/16/2015 1436   NITRITE NEGATIVE 06/10/2015 1337   LEUKOCYTESUR Negative 11/16/2015 1436   Sepsis Labs: @LABRCNTIP (procalcitonin:4,lacticidven:4) )No results found for this or any previous visit (from the past 240 hours).   Radiological Exams on Admission: CT Angio Abd/Pel W and/or Wo Contrast Result Date: 03/29/2024 CLINICAL DATA:  Rectal bleeding, hemorrhoids EXAM: CTA ABDOMEN AND PELVIS WITHOUT AND WITH CONTRAST TECHNIQUE: Multidetector CT imaging of the abdomen and pelvis was performed using the standard protocol during bolus administration of intravenous contrast. Multiplanar reconstructed images and MIPs were obtained and reviewed to evaluate the vascular anatomy. RADIATION DOSE REDUCTION: This exam was performed according to the departmental dose-optimization program which  includes automated exposure control, adjustment of the mA and/or kV according to patient size and/or use of iterative reconstruction technique. CONTRAST:  OMNIPAQUE  IOHEXOL  350 MG/ML SOLN COMPARISON:  05/07/2023 FINDINGS: VASCULAR Normal contour and caliber of the abdominal aorta. No evidence of aneurysm, dissection, or other acute aortic pathology. Standard branching pattern of the abdominal aorta with solitary bilateral renal arteries. Mild aortic atherosclerosis. Review of the MIP images confirms the above findings. NON-VASCULAR Lower Chest: No acute findings. Hepatobiliary: No solid liver abnormality is seen. No gallstones, gallbladder wall thickening, or biliary dilatation. Pancreas: Unremarkable. No pancreatic ductal dilatation or surrounding inflammatory changes. Spleen: Normal in size without significant abnormality. Adrenals/Urinary Tract: Adrenal glands are unremarkable. Nonobstructive bilateral nephrolithiasis. No hydronephrosis. Bladder is unremarkable. Stomach/Bowel: Stomach is within normal limits. Appendix not clearly visualized. No evidence of bowel wall thickening, distention, or inflammatory changes. Descending and sigmoid diverticulosis. No intraluminal contrast extravasation or other findings to specifically localize GI bleeding. Lymphatic: No enlarged abdominal or pelvic lymph nodes. Reproductive: No mass or other significant abnormality. Other: No abdominal wall hernia or abnormality. No ascites. Musculoskeletal: No acute osseous findings. IMPRESSION: 1. No intraluminal contrast extravasation or other findings to specifically localize GI bleeding. 2. Descending and sigmoid diverticulosis without evidence of acute diverticulitis. 3. Normal contour and caliber of the abdominal aorta. No evidence of aneurysm, dissection, or other acute aortic pathology. Mild aortic atherosclerosis. 4. Nonobstructive bilateral nephrolithiasis. Aortic Atherosclerosis (ICD10-I70.0). Electronically Signed   By:  Fredricka Jenny M.D.   On: 03/29/2024 22:19    Assessment/Plan   1. GI bleeding  - Recurrent episodes of hematochezia, most likely diverticular bleeding - Initial Hgb is normal and he has been hemodynamically stable  - Continue bowel rest, hold ASA and Effient , follow serial CBCs, follow-up on  GI recommendations   2. CAD  - No anginal symptoms  - Hold ASA and Effient  for now    3. Type II DM  - A1c was 6.6% in 2021  - Check CBGs and use low-intensity SSI for now   4. CKD 3A  - Appears close to baseline  - Renally-dose medications    5. Chronic diastolic CHF  - Appears compensated  - Hold diuretics while NPO    6. Hypertension  - Hold antihypertensives and treat as-needed only for now   DVT prophylaxis: SCDs  Code Status: Full  Level of Care: Level of care: Telemetry Family Communication: None present   Disposition Plan:  Patient is from: home  Anticipated d/c is to: Home  Anticipated d/c date is: Possibly as early as 5/12 or 5/13 Patient currently: pending stable H&H  Consults called: GI  Admission status: Observation     Walton Guppy, MD [facility]  03/30/2024, 12:06 AM

## 2024-03-30 NOTE — TOC CM/SW Note (Signed)
 Transition of Care [facility]) - Inpatient Brief Assessment   Patient Details  Name: Sergio Griffin MRN: [id number] Date of Birth: 07-02-1951  Transition of Care [facility]) CM/SW Contact:    Grandville Lax, LCSWA Phone Number: 03/30/2024, 10:18 AM   Clinical Narrative: Transition of Care Department [facility]) has reviewed patient and no TOC needs have been identified at this time. We will continue to monitor patient advancement through interdiciplinary progression rounds. If new patient transition needs arise, please place a TOC consult.   Transition of Care Asessment: Insurance and Status: Insurance coverage has been reviewed Patient has primary care physician: Yes Home environment has been reviewed: From home Prior level of function:: Independent Prior/Current Home Services: No current home services Social Drivers of Health Review: SDOH reviewed no interventions necessary Readmission risk has been reviewed: Yes Transition of care needs: no transition of care needs at this time

## 2024-03-30 NOTE — ED Notes (Signed)
 Bedside toilet and Recliner placed in room as patient requested

## 2024-03-30 NOTE — Consult Note (Addendum)
 Gastroenterology Consult   Referring Provider: No ref. provider found Primary Care Physician:  James Mcardle, MD Primary Gastroenterologist:  [facility]  Patient ID: Sergio Griffin; [id number]; [birth year]   Admit date: 03/29/2024  LOS: 0 days   Date of Consultation: 03/30/2024  Reason for Consultation:  Rectal bleeding  History of Present Illness   Sergio Griffin is a 73 y.o. year old male with medical history significant for hypertension, hyperlipidemia, type 2 diabetes mellitus, CAD and on Effient  with history of cardiac stents, CKD, OSA with CPAP intolerance, remote hx PUD reported secondary to H.pylori in the 90s, possible remote Barrett's in the past, likely ischemic colitis in 2016, presenting to the ED yesterday with acute rectal bleeding.   Admiting Hgb 15.1, 14.1 today. WBC count mildly elevated at 11.1, which appears to be chronic. INR 0.9. CTA yesterday without active bleeding. descending and sigmoid diverticulosis noted.   Initially started seeing rectal bleeding around lunch last Thursday. Had lower abdominal cramping prior to rectal bleeding, worsened on lower left side and persistent. Had several episodes and then stopped. Then yesterday had recurrent overt bleeding and saw bright red blood with clots. Had about 6 episodes until presenting to the ED yesterday. He went to lunch yesterday with a friend and felt mildly uncomfortable in abdomen. Felt fecal urgency every 30 minutes. Currently abdominal discomfort improved from admission but feels crampy intermittently.   Last episode of bleeding about an hour ago, darker in color this time. Had been trying to avoid using the bathroom for a few hours.   Has been doing low carb and higher fat diet, which helped keep him regular. Had spell of constipation last week. Had small amount of hard stool last Tuesday. GERD improved with diet changes. Currently not on a PPI.    81 mg aspirin . No other NSAIDs. Last dose of Effient  on Saturday  evening.    Colonoscopy 2012 by Dr. Verner Goltz normal with no polyps.  He was recommended to repeat colonoscopy in 5 years due to family history of colon polyps   EGD 10/26/20 - No endoscopic esophageal abnormality to explain patient's dysphagia. Esophagus dilated. Dilated. - Z-line regular, 40 cm from the incisors. - Gastritis. Biopsied. Negative H.pylori.  - Normal examined duodenum.   Colonoscopy 10/26/20 - One 25 mm polyp in the proximal ascending colon, removed piecemeal using a hot snare. Resected and retrieved. Clips (MR conditional) were placed. - Ten 4 to 12 mm polyps in the rectum, in the transverse colon, at the hepatic flexure and in the ascending colon, removed with a hot snare. Resected and retrieved. - Moderate diverticulosis in the sigmoid colon, in the descending colon, in the transverse colon and in the ascending colon. There was evidence of diverticular spasm. - Non-bleeding internal hemorrhoids.   1. Surgical [P], gastric antrum, gastric body - GASTRIC ANTRAL MUCOSA WITH MILD REACTIVE GASTROPATHY. - GASTRIC OXYNTIC MUCOSA WITH MILD CHRONIC GASTRITIS. - WARTHIN-STARRY STAIN IS NEGATIVE FOR HELICOBACTER PYLORI. 2. Surgical [P], colon, ascending-7, hepatic flexure-3, polyps (10) - TUBULAR ADENOMA (X MULTIPLE). - NEGATIVE FOR HIGH GRADE DYSPLASIA. 3. Surgical [P], colon, rectal - HYPERPLASTIC POLYP.     Past Medical History:  Diagnosis Date   Anxiety    Barrett's esophagus    CAD (coronary artery disease)    PCI, stent 2011   Cardiac arrhythmia    Chicken pox    Depression    Diverticulitis    Diverticulosis    Gastric ulcer    GERD (gastroesophageal reflux disease)  Glaucoma    Heart attack (HCC) 01/29/2019   History of myocardial infarction    Hyperlipidemia    Hypertension    Kidney stones    Kidney stones    Pneumonia 11/2014   Prostatitis    Reflux    Sleep apnea    UTI (lower urinary tract infection)     Past Surgical History:  Procedure  Laterality Date   cardiac stents     last one 01/29/2019   CYSTOSCOPY/URETEROSCOPY/HOLMIUM LASER/STENT PLACEMENT Left 10/22/2019   Procedure: CYSTOSCOPY/URETEROSCOPY/HOLMIUM LASER/STENT PLACEMENT;  Surgeon: Andrez Banker, MD;  Location: [facility];  Service: Urology;  Laterality: Left;   ESOPHAGEAL MANOMETRY N/A 09/11/2017   Procedure: ESOPHAGEAL MANOMETRY (EM);  Surgeon: Sergio Dandy, MD;  Location: [facility];  Service: Endoscopy;  Laterality: N/A;   EYE SURGERY Left    x 4   EYE SURGERY Right    x 3    Prior to Admission medications   Medication Sig Start Date End Date Taking? Authorizing Provider  acetaminophen  (TYLENOL ) 500 MG tablet Take 500-1,000 mg by mouth every 6 (six) hours as needed for moderate pain (pain score 4-6) or headache.   Yes [provider]  amLODipine  (NORVASC ) 5 MG tablet Take 5 mg by mouth daily. 10/18/20  Yes [provider]  aspirin  EC 81 MG tablet Take 81 mg by mouth at bedtime.    Yes [provider]  Carboxymethylcellul-Glycerin (LUBRICATING EYE DROPS OP) Place 1 drop into both eyes daily as needed (dry eyes).   Yes [provider]  JANUVIA 100 MG tablet  10/28/23  Yes [provider]  ketoconazole (NIZORAL) 2 % shampoo Apply topically. 10/15/20  Yes [provider]  latanoprost  (XALATAN ) 0.005 % ophthalmic solution Place 1 drop into both eyes at bedtime. 05/03/15  Yes [provider]  losartan  (COZAAR ) 100 MG tablet Take 1 tablet by mouth daily. 02/19/24  Yes [provider]  metoprolol  succinate (TOPROL -XL) 25 MG 24 hr tablet Take 12.5-25 mg by mouth daily.   Yes [provider]  miconazole  (MICONAZOLE  ANTIFUNGAL) 2 % cream Apply 1 application topically 2 (two) times daily. For 4-6 weeks 11/22/21  Yes Nandigam, Kavitha V, MD  mupirocin ointment (BACTROBAN) 2 % Apply topically. 10/25/23  Yes [provider]  nitroGLYCERIN  (NITROSTAT ) 0.4 MG SL tablet Place 1 tablet  (0.4 mg total) under the tongue every 5 (five) minutes x 3 doses as needed for chest pain. 06/30/15  Yes Love, Renay Carota, PA-C  prasugrel  (EFFIENT ) 10 MG TABS tablet Take 1 tablet by mouth daily. 02/28/24  Yes [provider]  ammonium lactate (AMLACTIN) 12 % cream Apply topically 2 (two) times daily. 10/15/20   [provider]  baclofen (LIORESAL) 10 MG tablet Take 10 mg by mouth 2 (two) times daily as needed. Patient not taking: Reported on 03/30/2024    [provider]  buPROPion  (WELLBUTRIN  XL) 300 MG 24 hr tablet Take 1 tablet (300 mg total) by mouth daily. Patient not taking: Reported on 02/05/2024 08/06/23   Roselyn Connor, PMHNP  clonazePAM  (KLONOPIN ) 0.5 MG tablet Take 1 tablet by mouth twice daily as needed for anxiety Patient not taking: Reported on 02/05/2024 11/04/23   Roselyn Connor, PMHNP  losartan  (COZAAR ) 50 MG tablet Take 100 mg by mouth daily.     [provider]  metoprolol  succinate (TOPROL -XL) 25 MG 24 hr tablet Take 1 tablet by mouth daily.    [provider]  Multiple Vitamin (MULTIVITAMIN) tablet Take  1 tablet by mouth daily.    [provider]  Omega-3 Fatty Acids (FISH OIL) 1000 MG CAPS Take 1,000 mg by mouth daily.    [provider]  ondansetron  (ZOFRAN ) 4 MG tablet Take 4 mg by mouth every 8 (eight) hours as needed for nausea or vomiting. Patient not taking: Reported on 07/17/2023    [provider]  ondansetron  (ZOFRAN -ODT) 4 MG disintegrating tablet DISSOLVE 1 TABLET IN MOUTH EVERY 8 HOURS AS NEEDED FOR NAUSEA FOR VOMITING 01/09/22   Nandigam, Kavitha V, MD  prasugrel  (EFFIENT ) 10 MG TABS tablet Take 1 tablet (10 mg total) by mouth at bedtime. Resume 48 hours after surgery 10/22/19   Andrez Banker, MD  silodosin (RAPAFLO) 4 MG CAPS capsule Take 1 capsule every day by oral route at bedtime for 90 days. 10/24/23   [provider]  Sorbitol  SOLN Take 30 mLs by mouth daily as needed. For  constipation Patient not taking: Reported on 07/17/2023 06/30/21   Lajuan Pila, MD  sucralfate  (CARAFATE ) 1 g tablet Take 1 tablet (1 g total) by mouth at bedtime. 09/28/20   Johnson, Clanford L, MD  torsemide (DEMADEX) 20 MG tablet Take 20 mg by mouth 2 (two) times daily.    [provider]  Vitamin D, Ergocalciferol, (DRISDOL) 1.25 MG (50000 UNIT) CAPS capsule Take 50,000 Units by mouth once a week. 12/25/22   [provider]    Current Facility-Administered Medications  Medication Dose Route Frequency Provider Last Rate Last Admin   acetaminophen  (TYLENOL ) tablet 650 mg  650 mg Oral Q6H PRN Opyd, Santana Cue, MD       Or   acetaminophen  (TYLENOL ) suppository 650 mg  650 mg Rectal Q6H PRN Opyd, Santana Cue, MD       atorvastatin (LIPITOR) tablet 10 mg  10 mg Oral Daily Justina Oman, MD       fentaNYL  (SUBLIMAZE ) injection 12.5-50 mcg  12.5-50 mcg Intravenous Q2H PRN Opyd, Timothy S, MD   50 mcg at 03/30/24 0037   insulin aspart (novoLOG) injection 0-6 Units  0-6 Units Subcutaneous Q4H Opyd, Timothy S, MD       metoprolol  succinate (TOPROL -XL) 24 hr tablet 25 mg  25 mg Oral See admin instructions Justina Oman, MD       prochlorperazine (COMPAZINE) injection 5 mg  5 mg Intravenous Q6H PRN Opyd, Santana Cue, MD       sodium chloride  flush (NS) 0.9 % injection 3 mL  3 mL Intravenous Q12H Opyd, Santana Cue, MD       tamsulosin (FLOMAX) capsule 0.4 mg  0.4 mg Oral QPC supper Justina Oman, MD        Allergies as of 03/29/2024 - Review Complete 03/29/2024  Allergen Reaction Noted   Tamsulosin Other (See Comments) 09/24/2020   Ciprofloxacin  Itching 09/24/2020   Ibuprofen Diarrhea and Nausea Only 10/20/2019   Propoxyphene Itching 10/20/2019   Sulfa antibiotics Itching 06/10/2015    Family History  Problem Relation Age of Onset   Arthritis Mother    Hyperlipidemia Mother    Stroke Mother    Hypertension Mother    Ovarian cancer Mother    Hyperlipidemia Father    Heart  disease Father    Hypertension Father    Non-Hodgkin's lymphoma Father    Hyperlipidemia Maternal Grandmother    Heart disease Maternal Grandmother    Stroke Maternal Grandmother    Hypertension Maternal Grandmother    Diabetes Maternal Grandmother    Hyperlipidemia Maternal Grandfather  Heart disease Maternal Grandfather    Hypertension Maternal Grandfather    Heart disease Paternal Grandmother    Heart disease Paternal Grandfather    Stroke Paternal Grandfather    Hypertension Paternal Grandfather    Congestive Heart Failure Brother    Colon cancer Neg Hx    Esophageal cancer Neg Hx    Pancreatic cancer Neg Hx    Stomach cancer Neg Hx    Liver disease Neg Hx     Social History   Socioeconomic History   Marital status: Divorced    Spouse name: Not on file   Number of children: 2   Years of education: 16   Highest education level: Not on file  Occupational History   Occupation: Academic librarian    Comment: Retired  Tobacco Use   Smoking status: Former    Types: Cigarettes   Smokeless tobacco: Never   Tobacco comments:    quit 1987  Vaping Use   Vaping status: Never Used  Substance and Sexual Activity   Alcohol  use: No   Drug use: No   Sexual activity: Not Currently    Partners: Female  Other Topics Concern   Not on file  Social History Narrative   Fun: Paint, cook, travel   Denies religious beliefs effecting health care.    Social Drivers of Corporate investment banker Strain: Not on file  Food Insecurity: No Food Insecurity (03/30/2024)   Hunger Vital Sign    Worried About Running Out of Food in the Last Year: Never true    Ran Out of Food in the Last Year: Never true  Transportation Needs: No Transportation Needs (03/30/2024)   PRAPARE - Administrator, Civil Service (Medical): No    Lack of Transportation (Non-Medical): No  Physical Activity: Not on file  Stress: Not on file  Social Connections: Moderately Integrated (03/30/2024)    Social Connection and Isolation Panel [NHANES]    Frequency of Communication with Friends and Family: More than three times a week    Frequency of Social Gatherings with Friends and Family: More than three times a week    Attends Religious Services: More than 4 times per year    Active Member of [employer] or Organizations: Yes    Attends Banker Meetings: More than 4 times per year    Marital Status: Divorced  Intimate Partner Violence: Not At Risk (03/30/2024)   Humiliation, Afraid, Rape, and Kick questionnaire    Fear of Current or Ex-Partner: No    Emotionally Abused: No    Physically Abused: No    Sexually Abused: No     Review of Systems   See HPI  Physical Exam   Vital Signs in last 24 hours: Temp:  [97.6 F (36.4 C)-98.3 F (36.8 C)] 97.6 F (36.4 C) (05/12 1255) Pulse Rate:  [67-84] 81 (05/12 1255) Resp:  [15-24] 20 (05/12 1255) BP: (102-162)/(55-97) 134/75 (05/12 1255) SpO2:  [85 %-99 %] 97 % (05/12 1255) Weight:  [142.4 kg] 142.4 kg (05/11 2006) Last BM Date : 03/30/24  General:   Alert,  Well-developed, well-nourished, pleasant and cooperative in NAD Head:  Normocephalic and atraumatic. Eyes:  Sclera clear, no icterus.   Conjunctiva pink. Ears:  Normal auditory acuity. Lungs:  Clear throughout to auscultation.    Heart:  S1 S2, no obvious murmurs Abdomen:  Soft, obese, nontender. Large AP diameter. No obvious HSM but difficult to assess with body habitus. +BS   Rectal: deferred  Extremities:  With lower extremity edema, venous stasis dermatitis lower extremities bilaterally Neurologic:  Alert and  oriented x4. Skin:  Intact without significant lesions or rashes. Psych:  Alert and cooperative. Normal mood and affect.  Intake/Output from previous day: No intake/output data recorded. Intake/Output this shift: No intake/output data recorded.    Labs/Studies   Recent Labs Recent Labs    03/29/24 2030 03/30/24 0637  WBC 11.2* 11.1*  HGB 15.1  14.1  HCT 47.1 45.6  PLT 246 224   BMET Recent Labs    03/29/24 2030 03/30/24 0637  NA 139 139  K 4.5 5.3*  CL 105 103  CO2 24 29  GLUCOSE 140* 142*  BUN 26* 24*  CREATININE 1.29* 1.39*  CALCIUM  9.4 8.8*   LFT Recent Labs    03/29/24 2030  PROT 7.1  ALBUMIN 3.8  AST 22  ALT 22  ALKPHOS 48  BILITOT 0.6   PT/INR Recent Labs    03/29/24 2030  LABPROT 12.2  INR 0.9     Radiology/Studies CT Angio Abd/Pel W and/or Wo Contrast Result Date: 03/29/2024 CLINICAL DATA:  Rectal bleeding, hemorrhoids EXAM: CTA ABDOMEN AND PELVIS WITHOUT AND WITH CONTRAST TECHNIQUE: Multidetector CT imaging of the abdomen and pelvis was performed using the standard protocol during bolus administration of intravenous contrast. Multiplanar reconstructed images and MIPs were obtained and reviewed to evaluate the vascular anatomy. RADIATION DOSE REDUCTION: This exam was performed according to the departmental dose-optimization program which includes automated exposure control, adjustment of the mA and/or kV according to patient size and/or use of iterative reconstruction technique. CONTRAST:  OMNIPAQUE  IOHEXOL  350 MG/ML SOLN COMPARISON:  05/07/2023 FINDINGS: VASCULAR Normal contour and caliber of the abdominal aorta. No evidence of aneurysm, dissection, or other acute aortic pathology. Standard branching pattern of the abdominal aorta with solitary bilateral renal arteries. Mild aortic atherosclerosis. Review of the MIP images confirms the above findings. NON-VASCULAR Lower Chest: No acute findings. Hepatobiliary: No solid liver abnormality is seen. No gallstones, gallbladder wall thickening, or biliary dilatation. Pancreas: Unremarkable. No pancreatic ductal dilatation or surrounding inflammatory changes. Spleen: Normal in size without significant abnormality. Adrenals/Urinary Tract: Adrenal glands are unremarkable. Nonobstructive bilateral nephrolithiasis. No hydronephrosis. Bladder is unremarkable.  Stomach/Bowel: Stomach is within normal limits. Appendix not clearly visualized. No evidence of bowel wall thickening, distention, or inflammatory changes. Descending and sigmoid diverticulosis. No intraluminal contrast extravasation or other findings to specifically localize GI bleeding. Lymphatic: No enlarged abdominal or pelvic lymph nodes. Reproductive: No mass or other significant abnormality. Other: No abdominal wall hernia or abnormality. No ascites. Musculoskeletal: No acute osseous findings. IMPRESSION: 1. No intraluminal contrast extravasation or other findings to specifically localize GI bleeding. 2. Descending and sigmoid diverticulosis without evidence of acute diverticulitis. 3. Normal contour and caliber of the abdominal aorta. No evidence of aneurysm, dissection, or other acute aortic pathology. Mild aortic atherosclerosis. 4. Nonobstructive bilateral nephrolithiasis. Aortic Atherosclerosis (ICD10-I70.0). Electronically Signed   By: Fredricka Jenny M.D.   On: 03/29/2024 22:19     Assessment   Sergio Griffin is a very pleasant 73 y.o. year old male with past medical history significant for hypertension, hyperlipidemia, type 2 diabetes mellitus, CAD and on Effient  with history of cardiac stents, CKD, OSA with CPAP intolerance, remote hx PUD reported secondary to H.pylori in the 90s, possible remote Barrett's in the past, likely ischemic colitis in 2016, presenting to the ED yesterday with acute rectal bleeding.   Rectal bleeding: on Effient . Clinical presentation most consistent with ischemic  etiology. Hemodynamically stable with Hgb 14.1 today, 15.1 on admission. Bleeding seems to be tapering. Unable to rule out diverticular bleed but less likely as endorsing classic crampy discomfort prior to onset of rectal bleeding. Notably, he does have a history of multiple polyps in 2021, one being a large polyp (25 mm) removed piecemeal fashion. Primary GI elected to follow clinically when patient last  seen in Jan 2023. As bleeding has tapered, can plan on observing and recommend follow-up with primary GI as outpatient to discuss colonoscopy.   Possible history of Barrett's: reported by patient in remote past. Esophagus normal in 2021. Has not been on a PPI as he notes GERD symptoms resolved with dietary and behavior modification. Recommend outpatient follow-up with primary GI in [place].   Last dose of Effient  Saturday evening.     Plan / Recommendations    Clear liquids for now; advance tomorrow if no overt bleeding Follow H/H For now plan on outpatient evaluation by primary GI in [facility] to discuss colonoscopy. As stable, can follow conservatively unless clinical changes Effient  on hold Continue supportive measures Further recommendations to follow     03/30/2024, 1:20 PM  Delman Ferns, PhD, ANP-BC [facility]

## 2024-03-30 NOTE — Plan of Care (Signed)

## 2024-03-30 NOTE — Progress Notes (Signed)
 Pt declined CPAP stating he has never been able to tolerate. I told him if he changes his mind to call RT. McCausland at bedside if needed.

## 2024-03-30 NOTE — Progress Notes (Signed)
 Progress Note   Patient: Sergio Griffin ZOX:[id number] DOB: 05/25/1951 DOA: 03/29/2024     0 DOS: the patient was seen and examined on 03/30/2024   Brief hospital admission narrative: As per H&P written by Dr. Brice Campi on 03/29/2024 Sergio Griffin is a 73 y.o. male with medical history significant for hypertension, hyperlipidemia, type 2 diabetes mellitus, CAD, CKD 3A, OSA with CPAP intolerance, history of PUD, and diverticulosis who presents with rectal bleeding.   Patient reports experiencing cramping discomfort in the lower abdomen with 2 episodes of BRBPR and passage of clots on 03/26/2024.  He did not have any abdominal discomfort or GI bleeding again until today.  He experiences same symptoms today, felt as though he was going to have diarrhea, but passed mainly bright red blood and large clots.  He denies any upper abdominal discomfort, nausea, vomiting, chest pain, or increase in his chronic leg swelling.   ED Course: Upon arrival to the ED, patient is found to be afebrile and saturating low 90s on room air with normal HR and stable BP.  Labs are most notable for creatinine 1.29, WBC 11,200, and hemoglobin 15.1.  There are no acute findings on CTA abdomen and pelvis.   GI was consulted by the ED physician and the patient was treated with morphine , Zofran , and IV Protonix .  Assessment and plan 1-GI bleeding - Appears to be secondary to diverticular bleed versus AVM - Patient has remained hemodynamically stable - Hemoglobin 14.1 and platelet count 2 24K - Will continue supportive care and follow-up recommendations by GI service - Currently n.p.o. - Avoid the use of heparin  products and continue holding aspirin  and Effient .  2-type 2 diabetes mellitus with nephropathy - Follow A1c and CBGs fluctuation - Continuous sliding scale insulin - Will hold oral hypoglycemic agents while inpatient.  3-chronic kidney disease stage IIIa - Appears to be stable and at baseline - Continue to follow  renal function trend.  4-chronic diastolic heart failure - Appears to be compensated - Planning to resume home GDMT regimen at the moment of discharge. - For now continue holding diuretics while NPO. - Daily weight and strict intake and output will be continued.  5-hypertension - Vital signs appear stable - Continue to follow blood pressure. - Continue the use of metoprolol .  6-morbid obesity with obstructive sleep apnea -Body mass index is 43.79 kg/m.  -Low-calorie diet and portion control discussed with patient - Continue CPAP nightly.  7-history of coronary artery disease - No chest pain - Telemetry not demonstrating acute ischemic changes - While actively having GI bleed we will continue holding aspirin  and Effient . - Continue metoprolol  and Lipitor  8-hyperlipidemia - Continue statin.  Subjective:  Reported intermittent episode of hematochezia; left lower abdomen discomfort relief with bowels.  No fever, no chest pain and no shortness of breath.  Physical Exam: Vitals:   03/30/24 0700 03/30/24 0800 03/30/24 0825 03/30/24 0900  BP: (!) 153/72 (!) 126/55  123/67  Pulse: 79 81 80 80  Resp: 20 20 20 20   Temp:  98.3 F (36.8 C)    TempSrc:  Oral    SpO2: 99% 92% 93% 94%  Weight:      Height:       General exam: Alert, awake, oriented x 3; no major distress. Respiratory system: Clear to auscultation. Respiratory effort normal.  Good saturation on room air. Cardiovascular system:RRR. No rubs or; no JVD. Gastrointestinal system: Abdomen is obese, nondistended, soft and and without guarding.  Positive bowel sounds.  Central nervous system: Alert and oriented. No focal neurological deficits. Extremities: No cyanosis or clubbing. Skin: No petechiae; chronic SSE's dermatitis appreciated on his legs bilaterally; unchanged from baseline. Psychiatry: Judgement and insight appear normal. Mood & affect appropriate.    Data Reviewed: Basic metabolic panel: Sodium 139,  potassium 5.3, chloride 103, bicarb 29, BUN 24, creatinine 1.3 and GFR 53 CBC: WBCs 11.1, hemoglobin 14.1 and platelet count 2 24K  Family Communication: No further  Disposition: Status is: Observation The patient remains OBS appropriate and will d/c before 2 midnights.  Time spent: 50 minutes  Author: Justina Oman, MD 03/30/2024 10:10 AM  For on call review [email or web address].

## 2024-03-30 NOTE — ED Notes (Signed)
 Pt placed on O2 @ 2L [place] for sats of 87% while sleeping. O2 sats improved to 92%. Will continue to monitor.

## 2024-03-31 ENCOUNTER — Ambulatory Visit: Attending: Pharmacist | Admitting: Pharmacist

## 2024-03-31 DIAGNOSIS — N1831 Chronic kidney disease, stage 3a: Secondary | ICD-10-CM | POA: Diagnosis not present

## 2024-03-31 DIAGNOSIS — Z860101 Personal history of adenomatous and serrated colon polyps: Secondary | ICD-10-CM | POA: Diagnosis not present

## 2024-03-31 DIAGNOSIS — I10 Essential (primary) hypertension: Secondary | ICD-10-CM | POA: Diagnosis not present

## 2024-03-31 DIAGNOSIS — K922 Gastrointestinal hemorrhage, unspecified: Secondary | ICD-10-CM | POA: Diagnosis not present

## 2024-03-31 DIAGNOSIS — E119 Type 2 diabetes mellitus without complications: Secondary | ICD-10-CM | POA: Diagnosis not present

## 2024-03-31 DIAGNOSIS — Z860102 Personal history of hyperplastic colon polyps: Secondary | ICD-10-CM | POA: Diagnosis not present

## 2024-03-31 LAB — CBC
HCT: 39.8 % (ref 39.0–52.0)
HCT: 39.9 % (ref 39.0–52.0)
Hemoglobin: 12.2 g/dL — ABNORMAL LOW (ref 13.0–17.0)
Hemoglobin: 12.7 g/dL — ABNORMAL LOW (ref 13.0–17.0)
MCH: 29.2 pg (ref 26.0–34.0)
MCH: 30 pg (ref 26.0–34.0)
MCHC: 30.7 g/dL (ref 30.0–36.0)
MCHC: 31.8 g/dL (ref 30.0–36.0)
MCV: 95.2 fL (ref 80.0–100.0)
MCV: 94.3 fL (ref 80.0–100.0)
Platelets: 203 10*3/uL (ref 150–400)
Platelets: 223 10*3/uL (ref 150–400)
RBC: 4.18 MIL/uL — ABNORMAL LOW (ref 4.22–5.81)
RBC: 4.23 MIL/uL (ref 4.22–5.81)
RDW: 13.9 % (ref 11.5–15.5)
RDW: 13.8 % (ref 11.5–15.5)
WBC: 11.4 10*3/uL — ABNORMAL HIGH (ref 4.0–10.5)
WBC: 15.6 10*3/uL — ABNORMAL HIGH (ref 4.0–10.5)
nRBC: 0 % (ref 0.0–0.2)
nRBC: 0 % (ref 0.0–0.2)

## 2024-03-31 LAB — GLUCOSE, CAPILLARY
Glucose-Capillary: 115 mg/dL — ABNORMAL HIGH (ref 70–99)
Glucose-Capillary: 127 mg/dL — ABNORMAL HIGH (ref 70–99)
Glucose-Capillary: 131 mg/dL — ABNORMAL HIGH (ref 70–99)
Glucose-Capillary: 142 mg/dL — ABNORMAL HIGH (ref 70–99)
Glucose-Capillary: 190 mg/dL — ABNORMAL HIGH (ref 70–99)
Glucose-Capillary: 196 mg/dL — ABNORMAL HIGH (ref 70–99)
Glucose-Capillary: 231 mg/dL — ABNORMAL HIGH (ref 70–99)

## 2024-03-31 LAB — BASIC METABOLIC PANEL WITH GFR
Anion gap: 7 (ref 5–15)
BUN: 16 mg/dL (ref 8–23)
CO2: 27 mmol/L (ref 22–32)
Calcium: 8.4 mg/dL — ABNORMAL LOW (ref 8.9–10.3)
Chloride: 105 mmol/L (ref 98–111)
Creatinine, Ser: 1.32 mg/dL — ABNORMAL HIGH (ref 0.61–1.24)
GFR, Estimated: 57 mL/min — ABNORMAL LOW (ref >60)
Glucose, Bld: 109 mg/dL — ABNORMAL HIGH (ref 70–99)
Potassium: 4.5 mmol/L (ref 3.5–5.1)
Sodium: 139 mmol/L (ref 135–145)

## 2024-03-31 MED ORDER — PRASUGREL HCL 10 MG PO TABS: 10.0000 mg | ORAL_TABLET | Freq: Every day | ORAL | Status: AC

## 2024-03-31 MED ORDER — ATORVASTATIN CALCIUM 10 MG PO TABS: 10.0000 mg | ORAL_TABLET | Freq: Every day | ORAL | Status: DC

## 2024-03-31 MED ORDER — MELATONIN 3 MG PO TABS
6.0000 mg | ORAL_TABLET | Freq: Every day | ORAL | Status: DC
  Administered 2024-03-31: 6 mg via ORAL
  Filled 2024-03-31: qty 2

## 2024-03-31 MED ORDER — ASPIRIN EC 81 MG PO TBEC: 81.0000 mg | DELAYED_RELEASE_TABLET | Freq: Every day | ORAL | Status: AC

## 2024-03-31 NOTE — Progress Notes (Signed)
 Subjective: Last episode of rectal bleeding was this morning around 3 AM where he was passing clots of blood, but otherwise no liquid blood.  Continues to have some mild abdominal discomfort, but nothing significant.  States abdominal discomfort has been mild intermittent issue for him, but is never had the issue with rectal bleeding.  Typically, he backs his diet down to clear liquids for couple days and his abdominal discomfort resolves.  Objective: Vital signs in last 24 hours: Temp:  [97.6 F (36.4 C)-98.1 F (36.7 C)] 98.1 F (36.7 C) (05/13 0410) Pulse Rate:  [61-81] 72 (05/13 0847) Resp:  [16-20] 16 (05/13 0410) BP: (101-139)/(60-75) 139/68 (05/13 0847) SpO2:  [91 %-97 %] 91 % (05/13 0410) Weight:  [143.8 kg] 143.8 kg (05/13 0410) Last BM Date : 03/30/24 General:   Alert and oriented, pleasant, no acute distress. Head:  Normocephalic and atraumatic. Eyes:  No icterus, sclera clear. Conjuctiva pink.  Abdomen:  Bowel sounds present, soft, obese. Mild TTP in LUQ and RUQ region.  No HSM or hernias noted. No rebound or guarding.  Msk:  Symmetrical without gross deformities. Normal posture. Neurologic:  Alert and  oriented x4;  grossly normal neurologically. Psych:  Normal mood and affect.  Intake/Output from previous day: 05/12 0701 - 05/13 0700 In: 3 [I.V.:3] Out: -  Intake/Output this shift: No intake/output data recorded.  Lab Results: Recent Labs    03/30/24 1325 03/30/24 2056 03/31/24 0351  WBC 10.3 11.4* 11.4*  HGB 14.0 13.3 12.2*  HCT 45.0 42.8 39.8  PLT 225 227 203   BMET Recent Labs    03/29/24 2030 03/30/24 0637 03/31/24 0351  NA 139 139 139  K 4.5 5.3* 4.5  CL 105 103 105  CO2 24 29 27   GLUCOSE 140* 142* 109*  BUN 26* 24* 16  CREATININE 1.29* 1.39* 1.32*  CALCIUM  9.4 8.8* 8.4*   LFT Recent Labs    03/29/24 2030  PROT 7.1  ALBUMIN 3.8  AST 22  ALT 22  ALKPHOS 48  BILITOT 0.6   PT/INR Recent Labs    03/29/24 2030  LABPROT 12.2   INR 0.9    Studies/Results: CT Angio Abd/Pel W and/or Wo Contrast Result Date: 03/29/2024 CLINICAL DATA:  Rectal bleeding, hemorrhoids EXAM: CTA ABDOMEN AND PELVIS WITHOUT AND WITH CONTRAST TECHNIQUE: Multidetector CT imaging of the abdomen and pelvis was performed using the standard protocol during bolus administration of intravenous contrast. Multiplanar reconstructed images and MIPs were obtained and reviewed to evaluate the vascular anatomy. RADIATION DOSE REDUCTION: This exam was performed according to the departmental dose-optimization program which includes automated exposure control, adjustment of the mA and/or kV according to patient size and/or use of iterative reconstruction technique. CONTRAST:  OMNIPAQUE  IOHEXOL  350 MG/ML SOLN COMPARISON:  05/07/2023 FINDINGS: VASCULAR Normal contour and caliber of the abdominal aorta. No evidence of aneurysm, dissection, or other acute aortic pathology. Standard branching pattern of the abdominal aorta with solitary bilateral renal arteries. Mild aortic atherosclerosis. Review of the MIP images confirms the above findings. NON-VASCULAR Lower Chest: No acute findings. Hepatobiliary: No solid liver abnormality is seen. No gallstones, gallbladder wall thickening, or biliary dilatation. Pancreas: Unremarkable. No pancreatic ductal dilatation or surrounding inflammatory changes. Spleen: Normal in size without significant abnormality. Adrenals/Urinary Tract: Adrenal glands are unremarkable. Nonobstructive bilateral nephrolithiasis. No hydronephrosis. Bladder is unremarkable. Stomach/Bowel: Stomach is within normal limits. Appendix not clearly visualized. No evidence of bowel wall thickening, distention, or inflammatory changes. Descending and sigmoid diverticulosis. No intraluminal contrast  extravasation or other findings to specifically localize GI bleeding. Lymphatic: No enlarged abdominal or pelvic lymph nodes. Reproductive: No mass or other significant  abnormality. Other: No abdominal wall hernia or abnormality. No ascites. Musculoskeletal: No acute osseous findings. IMPRESSION: 1. No intraluminal contrast extravasation or other findings to specifically localize GI bleeding. 2. Descending and sigmoid diverticulosis without evidence of acute diverticulitis. 3. Normal contour and caliber of the abdominal aorta. No evidence of aneurysm, dissection, or other acute aortic pathology. Mild aortic atherosclerosis. 4. Nonobstructive bilateral nephrolithiasis. Aortic Atherosclerosis (ICD10-I70.0). Electronically Signed   By: Fredricka Jenny M.D.   On: 03/29/2024 22:19    Assessment: 73 y.o. year old male with past medical history significant for hypertension, hyperlipidemia, type 2 diabetes mellitus, CAD and on Effient  with history of cardiac stents, CKD, OSA with CPAP intolerance, remote hx PUD reported secondary to H.pylori in the 90s, possible remote Barrett's in the past, likely ischemic colitis in 2016, presenting to the ED on 03/29/24 with acute rectal bleeding and mild abdominal cramping. CT angio A/P showed no evidence of localized active GI bleeding.  He did have diverticulosis, but no evidence of diverticulitis or other colon inflammation.  Hemoglobin was 15.1 on admission, down to 12.2 today which is likely primarily driven by acute blood loss, but may also be impacted by IV fluids given over the last 24 hours.   Clinically, patient is feeling well today.  Last episode of rectal bleeding was around 3 AM this morning.  Suspect patient likely suffered from acute diverticular bleed, but unable to rule out component of ischemic colitis given abdominal cramping prior to bleeding onset.  As rectal bleeding has resolved, would be okay to discharge home later today as long as he continues to do well with close outpatient follow-up with primary GI for colonoscopy.  Notably, he does have history of multiple polyps in 2021, one being a large polyp (25 mm) removed  piecemeal fashion with recommendations to repeat colonoscopy in 1 year, but this was never pursued.  Per outpatient office visit note in January 2023 with Dr. Leonia Raman, decided to hold off on preventative surveillance colonoscopies going forward due to potential risk outweighing benefits given his comorbid conditions.   Plan: Continue to monitor for recurrent rectal bleeding. If no recurrent bleeding by this afternoon, ok to discharge home with close outpatient follow-up with primary GI for colonoscopy. Will send staff message to Dr. Leonia Raman regarding need for outpatient follow-up.  Recommended return to the ER if recurrent large-volume rectal bleeding.    LOS: 0 days    03/31/2024, 12:23 PM   Shana Daring, [facility]

## 2024-03-31 NOTE — Plan of Care (Signed)
  Problem: Education: Goal: Ability to describe self-care measures that may prevent or decrease complications (Diabetes Survival Skills Education) will improve Outcome: Progressing Goal: Individualized Educational Video(s) Outcome: Progressing   Problem: Fluid Volume: Goal: Ability to maintain a balanced intake and output will improve Outcome: Progressing   Problem: Health Behavior/Discharge Planning: Goal: Ability to identify and utilize available resources and services will improve Outcome: Progressing

## 2024-03-31 NOTE — Plan of Care (Signed)
  Problem: Metabolic: Goal: Ability to maintain appropriate glucose levels will improve Outcome: Progressing   Problem: Nutritional: Goal: Maintenance of adequate nutrition will improve Outcome: Progressing   Problem: Skin Integrity: Goal: Risk for impaired skin integrity will decrease Outcome: Progressing   

## 2024-03-31 NOTE — Discharge Summary (Signed)
 Physician Discharge Summary   Patient: Sergio Griffin MRN: [id number] DOB: 1950-11-28  Admit date:     03/29/2024  Discharge date: 03/31/24  Discharge Physician: Justina Oman   PCP: James Mcardle, MD   Recommendations at discharge:  Reassess blood pressure and adjust antihypertensive regimen as needed Repeat CBC to follow hemoglobin trend. Continue assisting patient with weight loss management - Patient patient follow-up with gastroenterology as instructed.  Discharge Diagnoses: Principal Problem:   Lower GI bleeding Active Problems:   CAD (coronary artery disease), native coronary artery   Essential hypertension   CKD (chronic kidney disease), stage III (HCC)   Diastolic dysfunction   Diabetes mellitus type II, non insulin dependent [facility])  Brief hospital admission narrative: As per H&P written by Dr. Brice Campi on 03/29/2024 Sergio Griffin is a 73 y.o. male with medical history significant for hypertension, hyperlipidemia, type 2 diabetes mellitus, CAD, CKD 3A, OSA with CPAP intolerance, history of PUD, and diverticulosis who presents with rectal bleeding.   Patient reports experiencing cramping discomfort in the lower abdomen with 2 episodes of BRBPR and passage of clots on 03/26/2024.  He did not have any abdominal discomfort or GI bleeding again until today.  He experiences same symptoms today, felt as though he was going to have diarrhea, but passed mainly bright red blood and large clots.  He denies any upper abdominal discomfort, nausea, vomiting, chest pain, or increase in his chronic leg swelling.   ED Course: Upon arrival to the ED, patient is found to be afebrile and saturating low 90s on room air with normal HR and stable BP.  Labs are most notable for creatinine 1.29, WBC 11,200, and hemoglobin 15.1.  There are no acute findings on CTA abdomen and pelvis.   GI was consulted by the ED physician and the patient was treated with morphine , Zofran , and IV Protonix .  Assessment  and Plan: 1-GI bleeding - Appears to be secondary to diverticular bleed versus AVM or less likely ischemic colitis. - Patient has remained hemodynamically stable - Hemoglobin 12.3 and platelet count 224K - Appreciate assistance, supportive care recommendations by GI service.  - Tolerating diet - Continue holding aspirin  and Effient  at discharge -Patient reports last bloody looking stools at 3 AM the day of discharge discharge.   2-type 2 diabetes mellitus with nephropathy - Follow A1c and CBGs fluctuation - Continuous sliding scale insulin - Will hold oral hypoglycemic agents while inpatient.   3-chronic kidney disease stage IIIa - Appears to be stable and at baseline - Continue to follow renal function trend. -Repeat basic metabolic panel follow-up visit.   4-chronic diastolic heart failure - Appears to be compensated - Planning to resume home GDMT regimen at the moment of discharge. - Daily weight and strict intake and output will be continued.   5-hypertension - Vital signs appear stable - Continue to follow blood pressure. - Resume home antihypertensive agents.   6-morbid obesity with obstructive sleep apnea -Body mass index is 43.79 kg/m.  -Low-calorie diet and portion control discussed with patient   7-history of coronary artery disease - No chest pain - Telemetry not demonstrating acute ischemic changes - Aspirin  and Effient  on hold till Friday. - Continue metoprolol  and Lipitor. - Continue outpatient follow-up with cardiology service.   8-hyperlipidemia - Continue statin.  Consultants: Gastroenterology Procedures performed: See below for x-ray reports. Disposition: Home Diet recommendation: Heart healthy, modified carbohydrate and low calorie diet.  DISCHARGE MEDICATION: Allergies as of 03/31/2024  Reactions   Tamsulosin Other (See Comments)   Light headed and pass out   Ciprofloxacin  Itching   Ibuprofen Diarrhea, Nausea Only   Propoxyphene  Itching   Darvocet   Sulfa Antibiotics Itching        Medication List     TAKE these medications    acetaminophen  500 MG tablet Commonly known as: TYLENOL  Take 500-1,000 mg by mouth every 6 (six) hours as needed for moderate pain (pain score 4-6) or headache.   amLODipine  5 MG tablet Commonly known as: NORVASC  Take 5 mg by mouth daily.   aspirin  EC 81 MG tablet Take 1 tablet (81 mg total) by mouth at bedtime. Start taking on: Apr 03, 2024 What changed: These instructions start on Apr 03, 2024. If you are unsure what to do until then, ask your doctor or other care provider.   atorvastatin 10 MG tablet Commonly known as: LIPITOR Take 1 tablet (10 mg total) by mouth daily.   buPROPion  300 MG 24 hr tablet Commonly known as: WELLBUTRIN  XL Take 1 tablet (300 mg total) by mouth daily.   Fish Oil 1000 MG Caps Take 1,000 mg by mouth daily.   Januvia 100 MG tablet Generic drug: sitaGLIPtin   ketoconazole 2 % shampoo Commonly known as: NIZORAL Apply topically.   latanoprost  0.005 % ophthalmic solution Commonly known as: XALATAN  Place 1 drop into both eyes at bedtime.   losartan  100 MG tablet Commonly known as: COZAAR  Take 1 tablet by mouth daily. What changed: Another medication with the same name was removed. Continue taking this medication, and follow the directions you see here.   LUBRICATING EYE DROPS OP Place 1 drop into both eyes daily as needed (dry eyes).   LUTEIN PO Take by mouth.   metoprolol  succinate 25 MG 24 hr tablet Commonly known as: TOPROL -XL Take 12.5-25 mg by mouth daily.   miconazole  2 % cream Commonly known as: Miconazole  Antifungal Apply 1 application topically 2 (two) times daily. For 4-6 weeks   mupirocin ointment 2 % Commonly known as: BACTROBAN Apply topically.   nitroGLYCERIN  0.4 MG SL tablet Commonly known as: NITROSTAT  Place 1 tablet (0.4 mg total) under the tongue every 5 (five) minutes x 3 doses as needed for chest pain.    ondansetron  4 MG disintegrating tablet Commonly known as: ZOFRAN -ODT DISSOLVE 1 TABLET IN MOUTH EVERY 8 HOURS AS NEEDED FOR NAUSEA FOR VOMITING   prasugrel  10 MG Tabs tablet Commonly known as: EFFIENT  Take 1 tablet (10 mg total) by mouth at bedtime. Resume 48 hours after surgery Start taking on: Apr 03, 2024 What changed: These instructions start on Apr 03, 2024. If you are unsure what to do until then, ask your doctor or other care provider.   torsemide 20 MG tablet Commonly known as: DEMADEX Take 20 mg by mouth 2 (two) times daily.   VITAMIN A PO Take by mouth.   Vitamin D (Ergocalciferol) 1.25 MG (50000 UNIT) Caps capsule Commonly known as: DRISDOL Take 50,000 Units by mouth 2 (two) times a week. Monday and Thursday        Follow-up Information     James Mcardle, MD. Schedule an appointment as soon as possible for a visit in 1 week(s).   Specialty: Internal Medicine Contact information: [address] [place] [address] [phone]                Discharge Exam: Filed Weights   03/29/24 2006 03/31/24 0410  Weight: (!) 142.4 kg Aaron Aas)  143.8 kg   General exam: Alert, awake, oriented x 3; no major distress. Respiratory system: Clear to auscultation. Respiratory effort normal.  Good saturation on room air. Cardiovascular system:RRR. No rubs or; no JVD. Gastrointestinal system: Abdomen is obese, nondistended, soft and and without guarding.  Positive bowel sounds. Central nervous system: Alert and oriented. No focal neurological deficits. Extremities: No cyanosis or clubbing. Skin: No petechiae; chronic SSE's dermatitis appreciated on his legs bilaterally; unchanged from baseline. Psychiatry: Judgement and insight appear normal. Mood & affect appropriate.     Condition at discharge: Stable and improved.  The results of significant diagnostics from this hospitalization (including imaging, microbiology, ancillary and laboratory) are listed below for  reference.   Imaging Studies: CT Angio Abd/Pel W and/or Wo Contrast Result Date: 03/29/2024 CLINICAL DATA:  Rectal bleeding, hemorrhoids EXAM: CTA ABDOMEN AND PELVIS WITHOUT AND WITH CONTRAST TECHNIQUE: Multidetector CT imaging of the abdomen and pelvis was performed using the standard protocol during bolus administration of intravenous contrast. Multiplanar reconstructed images and MIPs were obtained and reviewed to evaluate the vascular anatomy. RADIATION DOSE REDUCTION: This exam was performed according to the departmental dose-optimization program which includes automated exposure control, adjustment of the mA and/or kV according to patient size and/or use of iterative reconstruction technique. CONTRAST:  OMNIPAQUE  IOHEXOL  350 MG/ML SOLN COMPARISON:  05/07/2023 FINDINGS: VASCULAR Normal contour and caliber of the abdominal aorta. No evidence of aneurysm, dissection, or other acute aortic pathology. Standard branching pattern of the abdominal aorta with solitary bilateral renal arteries. Mild aortic atherosclerosis. Review of the MIP images confirms the above findings. NON-VASCULAR Lower Chest: No acute findings. Hepatobiliary: No solid liver abnormality is seen. No gallstones, gallbladder wall thickening, or biliary dilatation. Pancreas: Unremarkable. No pancreatic ductal dilatation or surrounding inflammatory changes. Spleen: Normal in size without significant abnormality. Adrenals/Urinary Tract: Adrenal glands are unremarkable. Nonobstructive bilateral nephrolithiasis. No hydronephrosis. Bladder is unremarkable. Stomach/Bowel: Stomach is within normal limits. Appendix not clearly visualized. No evidence of bowel wall thickening, distention, or inflammatory changes. Descending and sigmoid diverticulosis. No intraluminal contrast extravasation or other findings to specifically localize GI bleeding. Lymphatic: No enlarged abdominal or pelvic lymph nodes. Reproductive: No mass or other significant  abnormality. Other: No abdominal wall hernia or abnormality. No ascites. Musculoskeletal: No acute osseous findings. IMPRESSION: 1. No intraluminal contrast extravasation or other findings to specifically localize GI bleeding. 2. Descending and sigmoid diverticulosis without evidence of acute diverticulitis. 3. Normal contour and caliber of the abdominal aorta. No evidence of aneurysm, dissection, or other acute aortic pathology. Mild aortic atherosclerosis. 4. Nonobstructive bilateral nephrolithiasis. Aortic Atherosclerosis (ICD10-I70.0). Electronically Signed   By: Fredricka Jenny M.D.   On: 03/29/2024 22:19    Microbiology: Results for orders placed or performed during the hospital encounter of 09/24/20  Respiratory Panel by RT PCR (Flu A&B, Covid) - Nasopharyngeal Swab     Status: None   Collection Time: 09/25/20 12:00 AM   Specimen: Nasopharyngeal Swab  Result Value Ref Range Status   SARS Coronavirus 2 by RT PCR NEGATIVE NEGATIVE Final    Comment: (NOTE) SARS-CoV-2 target nucleic acids are NOT DETECTED.  The SARS-CoV-2 RNA is generally detectable in upper respiratoy specimens during the acute phase of infection. The lowest concentration of SARS-CoV-2 viral copies this assay can detect is 131 copies/mL. A negative result does not preclude SARS-Cov-2 infection and should not be used as the sole basis for treatment or other patient management decisions. A negative result may occur with  improper specimen collection/handling, submission of  specimen other than nasopharyngeal swab, presence of viral mutation(s) within the areas targeted by this assay, and inadequate number of viral copies (<131 copies/mL). A negative result must be combined with clinical observations, patient history, and epidemiological information. The expected result is Negative.  Fact Sheet for Patients:  https://www.moore.com/  Fact Sheet for Healthcare Providers:   https://www.young.biz/  This test is no t yet approved or cleared by the [place]  FDA and  has been authorized for detection and/or diagnosis of SARS-CoV-2 by FDA under an Emergency Use Authorization (EUA). This EUA will remain  in effect (meaning this test can be used) for the duration of the COVID-19 declaration under Section 564(b)(1) of the Act, 21 U.S.C. section 360bbb-3(b)(1), unless the authorization is terminated or revoked sooner.     Influenza A by PCR NEGATIVE NEGATIVE Final   Influenza B by PCR NEGATIVE NEGATIVE Final    Comment: (NOTE) The Xpert Xpress SARS-CoV-2/FLU/RSV assay is intended as an aid in  the diagnosis of influenza from Nasopharyngeal swab specimens and  should not be used as a sole basis for treatment. Nasal washings and  aspirates are unacceptable for Xpert Xpress SARS-CoV-2/FLU/RSV  testing.  Fact Sheet for Patients: https://www.moore.com/  Fact Sheet for Healthcare Providers: https://www.young.biz/  This test is not yet approved or cleared by the [place]  FDA and  has been authorized for detection and/or diagnosis of SARS-CoV-2 by  FDA under an Emergency Use Authorization (EUA). This EUA will remain  in effect (meaning this test can be used) for the duration of the  Covid-19 declaration under Section 564(b)(1) of the Act, 21  U.S.C. section 360bbb-3(b)(1), unless the authorization is  terminated or revoked. Performed at [facility], [address], [place], [place] [address]   Culture, blood (Routine X 2) w Reflex to ID Panel     Status: None   Collection Time: 09/25/20  3:24 AM   Specimen: Right Antecubital; Blood  Result Value Ref Range Status   Specimen Description RIGHT ANTECUBITAL  Final   Special Requests   Final    BOTTLES DRAWN AEROBIC AND ANAEROBIC Blood Culture adequate volume   Culture   Final    NO GROWTH 6 DAYS Performed at [facility], [address],  [place], [place] [address]    Report Status 10/01/2020 FINAL  Final  Culture, blood (Routine X 2) w Reflex to ID Panel     Status: None   Collection Time: 09/25/20  3:35 AM   Specimen: BLOOD RIGHT HAND  Result Value Ref Range Status   Specimen Description BLOOD RIGHT HAND  Final   Special Requests   Final    BOTTLES DRAWN AEROBIC AND ANAEROBIC Blood Culture adequate volume   Culture   Final    NO GROWTH 6 DAYS Performed at [facility], [address], [place], [place] [address]    Report Status 10/01/2020 FINAL  Final    Labs: CBC: Recent Labs  Lab 03/30/24 0637 03/30/24 1325 03/30/24 2056 03/31/24 0351 03/31/24 1336  WBC 11.1* 10.3 11.4* 11.4* 15.6*  HGB 14.1 14.0 13.3 12.2* 12.7*  HCT 45.6 45.0 42.8 39.8 39.9  MCV 96.0 95.7 95.3 95.2 94.3  PLT 224 225 227 203 223   Basic Metabolic Panel: Recent Labs  Lab 03/29/24 2030 03/30/24 0637 03/31/24 0351  NA 139 139 139  K 4.5 5.3* 4.5  CL 105 103 105  CO2 24 29 27   GLUCOSE 140* 142* 109*  BUN 26* 24* 16  CREATININE 1.29* 1.39* 1.32*  CALCIUM  9.4 8.8* 8.4*   Liver Function Tests: Recent Labs  Lab 03/29/24 2030  AST 22  ALT 22  ALKPHOS 48  BILITOT 0.6  PROT 7.1  ALBUMIN 3.8   CBG: Recent Labs  Lab 03/31/24 0412 03/31/24 0738 03/31/24 1113 03/31/24 1157 03/31/24 1613  GLUCAP 127* 131* 231* 190* 196*    Discharge time spent: greater than 30 minutes.  Signed: Justina Oman, MD [facility] 03/31/2024

## 2024-04-01 ENCOUNTER — Telehealth: Payer: Self-pay | Admitting: Gastroenterology

## 2024-04-01 NOTE — Telephone Encounter (Signed)
 Attempted to reach patient. No answer, left message for patient to return call to office to further discuss treatment plan.

## 2024-04-01 NOTE — Telephone Encounter (Signed)
 Agree, will need clearance from prescribing provider to hold Effient  7 days prior to the procedure.  He has history of advanced adenomatous colon polyps, is due for surveillance colonoscopy.  Reviewed chart and he was recently hospitalized.  If his symptoms have resolved, okay to schedule direct colonoscopy but if he is still symptomatic will need to be seen in the office for evaluation.  Thanks

## 2024-04-01 NOTE — Telephone Encounter (Signed)
 Patient called and stated that he was just discharged for [facility] due to him having a GI bleed. Patient was advised that he would been to call our office to schedule a endoscopy with Dr. Leonia Raman. Patient is not wanting to schedule an office visit to be seen but wanting to be scheduled for a direct endoscopy. Patient is requesting a call back.  Please advise.

## 2024-04-01 NOTE — Telephone Encounter (Signed)
 Patient called back. States he is on Effient  and had been holding for another reason. Patient wants to know if he should continue to hold this medication in anticipation of colonoscopy. I advised this patient that we would not be able to schedule him for a procedure this week. Advised him to resume taking medication per cardiology instructions.

## 2024-04-01 NOTE — Telephone Encounter (Signed)
 Patient returned call. Spoke with him, states last Sunday he had 6 bloody stools with large blood clots. Was admitted to [facility] and got released last night. States he has not had any blood stools since then. Denies any abdominal pain or N/V at this time. When having bloody stools, he did report LLQ abd pain. Patient would like to schedule colonoscopy, but does not want to schedule OV. Will route to provider to review.

## 2024-04-02 ENCOUNTER — Telehealth: Payer: Self-pay

## 2024-04-02 NOTE — Telephone Encounter (Signed)
 [facility] Pre-operative Risk Assessment     Request for surgical clearance:     Endoscopy Procedure  What type of surgery is being performed?     Colonoscopy  When is this surgery scheduled?     05/19/2024  What type of clearance is required ?   Pharmacy  Are there any medications that need to be held prior to surgery and how long? Effient  hold for 7 days  Practice name and name of physician performing surgery?       Gastroenterology Dr. Darleen Ee  What is your office phone and fax number?      Phone- [phone]  Fax- [phone]  Anesthesia type (None, local, MAC, general) ?       MAC   Please route your response to Laurie Poplar, RN

## 2024-04-02 NOTE — Telephone Encounter (Signed)
 Called and spoke with patient. Relayed provider message. He continues to deny any further symptoms of rectal bleeding or abdominal pain. Colonoscopy scheduled for 05/19/24 at 1330. Pre-visit appointment scheduled for 05/05/2024 at 1030. Patient verbalized he is ok with these dates. Advised patient that if he starts having symptoms of bleeding again to go to ER for further evaluation. Patient verbalized understanding.

## 2024-04-02 NOTE — Telephone Encounter (Signed)
   Patient Name: Sergio Griffin  DOB: 27-Sep-1951 MRN: [id number]  Primary Cardiologist: Lauro Portal, MD  Chart reviewed as part of pre-operative protocol coverage.   Per Dr. Katheryne Pane: He may hold Effient  for 7 days prior to procedure. Please resume Effient  as soon as possible postprocedure, at the discretion of the surgeon.    Ava Boatman, NP 04/02/2024, 4:36 PM

## 2024-05-05 ENCOUNTER — Telehealth: Payer: Self-pay | Admitting: *Deleted

## 2024-05-05 ENCOUNTER — Ambulatory Visit (AMBULATORY_SURGERY_CENTER): Admitting: *Deleted

## 2024-05-05 VITALS — Ht 71.0 in | Wt 302.0 lb

## 2024-05-05 DIAGNOSIS — Z860100 Personal history of colon polyps, unspecified: Secondary | ICD-10-CM

## 2024-05-05 MED ORDER — NA SULFATE-K SULFATE-MG SULF 17.5-3.13-1.6 GM/177ML PO SOLN: 1.0000 | Freq: Once | ORAL | 0 refills | Status: AC

## 2024-05-05 NOTE — Progress Notes (Signed)
 Pre visit completed over telephone. Instructions sent though MyChart and secure email.  Patient understands to hold Effient  7 days prior to procedure.    No egg or soy allergy known to patient  No issues known to pt with past sedation with any surgeries or procedures Patient denies ever being told they had issues or difficulty with intubation  No FH of Malignant Hyperthermia Pt is not on diet pills Pt is not on  home 02  Pt is not on blood thinners  Pt denies issues with constipation  No A fib or A flutter Have any cardiac testing pending-- no Pt instructed to use Singlecare.com or GoodRx for a price reduction on prep

## 2024-05-05 NOTE — Telephone Encounter (Signed)
 Attempting to reach patient for telephone PV

## 2024-05-05 NOTE — Telephone Encounter (Signed)
Patient reached and PV completed.

## 2024-05-18 ENCOUNTER — Telehealth: Payer: Self-pay | Admitting: Gastroenterology

## 2024-05-18 NOTE — Telephone Encounter (Signed)
 Spoke with patient and resent prep instructions in My chart and secure email

## 2024-05-18 NOTE — Telephone Encounter (Signed)
 Inbound call from patient, requesting instructions sent again through secure email.

## 2024-05-19 ENCOUNTER — Ambulatory Visit: Admitting: Gastroenterology

## 2024-05-19 ENCOUNTER — Encounter: Payer: Self-pay | Admitting: Gastroenterology

## 2024-05-19 VITALS — BP 122/57 | HR 77 | Temp 98.6°F | Resp 16 | Ht 71.0 in | Wt 302.0 lb

## 2024-05-19 DIAGNOSIS — Z1211 Encounter for screening for malignant neoplasm of colon: Secondary | ICD-10-CM | POA: Diagnosis not present

## 2024-05-19 DIAGNOSIS — Z860100 Personal history of colon polyps, unspecified: Secondary | ICD-10-CM

## 2024-05-19 DIAGNOSIS — D123 Benign neoplasm of transverse colon: Secondary | ICD-10-CM

## 2024-05-19 DIAGNOSIS — K5730 Diverticulosis of large intestine without perforation or abscess without bleeding: Secondary | ICD-10-CM

## 2024-05-19 DIAGNOSIS — D122 Benign neoplasm of ascending colon: Secondary | ICD-10-CM | POA: Diagnosis not present

## 2024-05-19 DIAGNOSIS — K644 Residual hemorrhoidal skin tags: Secondary | ICD-10-CM

## 2024-05-19 DIAGNOSIS — K648 Other hemorrhoids: Secondary | ICD-10-CM

## 2024-05-19 MED ORDER — SODIUM CHLORIDE 0.9 % IV SOLN: 500.0000 mL | Freq: Once | INTRAVENOUS | Status: DC

## 2024-05-19 NOTE — Op Note (Addendum)
 [facility] Patient Name: Sergio Griffin Procedure Date: 05/19/2024 2:08 PM MRN: [id number] Endoscopist: Gustav ALONSO Mcgee , MD, [id number] Age: 73 Referring MD:  Date of Birth: 09/12/1951 Gender: Male Account #: [id number] Procedure:                Colonoscopy Indications:              High risk colon cancer surveillance: Personal                            history of colonic polyps, High risk colon cancer                            surveillance: Personal history of adenoma (10 mm or                            greater in size), High risk colon cancer                            surveillance: Personal history of multiple (3 or                            more) adenomas Medicines:                Monitored Anesthesia Care Procedure:                Pre-Anesthesia Assessment:                           - Prior to the procedure, a History and Physical                            was performed, and patient medications and                            allergies were reviewed. The patient's tolerance of                            previous anesthesia was also reviewed. The risks                            and benefits of the procedure and the sedation                            options and risks were discussed with the patient.                            All questions were answered, and informed consent                            was obtained. Prior Anticoagulants: The patient                            last took Effient  (prasugrel ) 7 days prior to the  procedure. ASA Grade Assessment: III - A patient                            with severe systemic disease. After reviewing the                            risks and benefits, the patient was deemed in                            satisfactory condition to undergo the procedure.                           After obtaining informed consent, the colonoscope                            was passed under direct vision.  Throughout the                            procedure, the patient's blood pressure, pulse, and                            oxygen saturations were monitored continuously. The                            Olympus Scope SN [id number] was introduced through the                            anus and advanced to the the cecum, identified by                            appendiceal orifice and ileocecal valve. The                            colonoscopy was performed without difficulty. The                            patient tolerated the procedure well. The quality                            of the bowel preparation was adequate. The                            ileocecal valve, appendiceal orifice, and rectum                            were photographed. Scope In: 2:29:25 PM Scope Out: 2:52:45 PM Scope Withdrawal Time: 0 hours 14 minutes 32 seconds  Total Procedure Duration: 0 hours 23 minutes 20 seconds  Findings:                 The perianal and digital rectal examinations were                            normal.  Two sessile polyps were found in the transverse                            colon and ascending colon. The polyps were 4 to 6                            mm in size. These polyps were removed with a cold                            snare. Resection and retrieval were complete.                           Multiple large-mouthed, medium-mouthed and                            small-mouthed diverticula were found in the sigmoid                            colon and descending colon. There was narrowing of                            the colon in association with the diverticular                            opening. There was evidence of diverticular spasm.                            Peri-diverticular erythema was seen. There was                            evidence of an impacted diverticulum.                           Non-bleeding external and internal hemorrhoids were                             found during retroflexion. The hemorrhoids were                            medium-sized. Complications:            No immediate complications. Estimated Blood Loss:     Estimated blood loss was minimal. Impression:               - Two 4 to 6 mm polyps in the transverse colon and                            in the ascending colon, removed with a cold snare.                            Resected and retrieved.                           - Severe diverticulosis in the sigmoid colon and in  the descending colon. There was narrowing of the                            colon in association with the diverticular opening.                            There was evidence of diverticular spasm.                            Peri-diverticular erythema was seen. There was                            evidence of an impacted diverticulum.                           - Non-bleeding external and internal hemorrhoids. Recommendation:           - Patient has a contact number available for                            emergencies. The signs and symptoms of potential                            delayed complications were discussed with the                            patient. Return to normal activities tomorrow.                            Written discharge instructions were provided to the                            patient.                           - Resume previous diet.                           - Continue present medications.                           - Await pathology results.                           - No repeat colonoscopy due to age.                           - Resume Effient  (prasugrel ) at prior dose tomorrow. Kearie Mennen V. Darick Fetters, MD 05/19/2024 3:16:14 PM This report has been signed electronically.

## 2024-05-19 NOTE — Progress Notes (Unsigned)
 Sedate, gd SR, tolerated procedure well, VSS, report to RN

## 2024-05-19 NOTE — Patient Instructions (Addendum)
-   Resume previous diet. - Continue present medications. - Await pathology results. - No repeat colonoscopy due to age.   YOU HAD AN ENDOSCOPIC PROCEDURE TODAY AT THE [facility]:   Refer to the procedure report that was given to you for any specific questions about what was found during the examination.  If the procedure report does not answer your questions, please call your gastroenterologist to clarify.  If you requested that your care partner not be given the details of your procedure findings, then the procedure report has been included in a sealed envelope for you to review at your convenience later.  YOU SHOULD EXPECT: Some feelings of bloating in the abdomen. Passage of more gas than usual.  Walking can help get rid of the air that was put into your GI tract during the procedure and reduce the bloating. If you had a lower endoscopy (such as a colonoscopy or flexible sigmoidoscopy) you may notice spotting of blood in your stool or on the toilet paper. If you underwent a bowel prep for your procedure, you may not have a normal bowel movement for a few days.  Please Note:  You might notice some irritation and congestion in your nose or some drainage.  This is from the oxygen used during your procedure.  There is no need for concern and it should clear up in a day or so.  SYMPTOMS TO REPORT IMMEDIATELY:  Following lower endoscopy (colonoscopy or flexible sigmoidoscopy):  Excessive amounts of blood in the stool  Significant tenderness or worsening of abdominal pains  Swelling of the abdomen that is new, acute  Fever of 100F or higher  For urgent or emergent issues, a gastroenterologist can be reached at any hour by calling [phone]. Do not use MyChart messaging for urgent concerns.    DIET:  We do recommend a small meal at first, but then you may proceed to your regular diet.  Drink plenty of fluids but you should avoid alcoholic beverages for 24 hours.  ACTIVITY:   You should plan to take it easy for the rest of today and you should NOT DRIVE or use heavy machinery until tomorrow (because of the sedation medicines used during the test).    FOLLOW UP: Our staff will call the number listed on your records the next business day following your procedure.  We will call around 7:15- 8:00 am to check on you and address any questions or concerns that you may have regarding the information given to you following your procedure. If we do not reach you, we will leave a message.     If any biopsies were taken you will be contacted by phone or by letter within the next 1-3 weeks.  Please call us at [phone] if you have not heard about the biopsies in 3 weeks.    SIGNATURES/CONFIDENTIALITY: You and/or your care partner have signed paperwork which will be entered into your electronic medical record.  These signatures attest to the fact that that the information above on your After Visit Summary has been reviewed and is understood.  Full responsibility of the confidentiality of this discharge information lies with you and/or your care-partner.

## 2024-05-19 NOTE — Progress Notes (Unsigned)
 Pt's states no medical or surgical changes since previsit or office visit.

## 2024-05-19 NOTE — Progress Notes (Unsigned)
 [facility] History and Physical   Primary Care Physician:  Milana Sharper, MD   Reason for Procedure:  History of adenomatous colon polyps  Plan:    Surveillance colonoscopy with possible interventions as needed     HPI: Sergio Griffin is a very pleasant 73 y.o. male here for surveillance colonoscopy. Denies any nausea, vomiting, abdominal pain, melena or bright red blood per rectum  The risks and benefits as well as alternatives of endoscopic procedure(s) have been discussed and reviewed. All questions answered. The patient agrees to proceed.    Past Medical History:  Diagnosis Date   Anxiety    Barrett's esophagus    CAD (coronary artery disease)    PCI, stent 2011   Cardiac arrhythmia    Chicken pox    Depression    Diabetes mellitus without complication (HCC)    Diverticulitis    Diverticulosis    Gastric ulcer    GERD (gastroesophageal reflux disease)    Glaucoma    Heart attack (HCC) 01/29/2019   History of myocardial infarction    Hyperlipidemia    Hypertension    Kidney stones    Kidney stones    Pneumonia 11/2014   Prostatitis    Reflux    Sleep apnea    UTI (lower urinary tract infection)     Past Surgical History:  Procedure Laterality Date   cardiac stents     last one 01/29/2019   CYSTOSCOPY/URETEROSCOPY/HOLMIUM LASER/STENT PLACEMENT Left 10/22/2019   Procedure: CYSTOSCOPY/URETEROSCOPY/HOLMIUM LASER/STENT PLACEMENT;  Surgeon: Cam Morene ORN, MD;  Location: [facility];  Service: Urology;  Laterality: Left;   ESOPHAGEAL MANOMETRY N/A 09/11/2017   Procedure: ESOPHAGEAL MANOMETRY (EM);  Surgeon: Shila Gustav GAILS, MD;  Location: [facility];  Service: Endoscopy;  Laterality: N/A;   EYE SURGERY Left    x 4   EYE SURGERY Right    x 3    Prior to Admission medications   Medication Sig Start Date End Date Taking? Authorizing Provider  amLODipine  (NORVASC ) 5 MG tablet Take 5 mg by mouth daily. 10/18/20  Yes [provider]  aspirin  EC 81 MG tablet Take 1 tablet (81 mg total) by mouth at bedtime. 04/03/24  Yes Ricky Fines, MD  Carboxymethylcellul-Glycerin (LUBRICATING EYE DROPS OP) Place 1 drop into both eyes daily as needed (dry eyes).   Yes [provider]  clindamycin (CLEOCIN) 150 MG capsule Take 150 mg by mouth every 6 (six) hours. 05/08/24  Yes [provider]  HYDROcodone-acetaminophen  (NORCO/VICODIN) 5-325 MG tablet Take 1 tablet by mouth every 8 (eight) hours as needed. 05/14/24  Yes [provider]  JANUVIA 100 MG tablet  10/28/23  Yes [provider]  ketoconazole (NIZORAL) 2 % shampoo Apply topically. 10/15/20  Yes [provider]  losartan  (COZAAR ) 100 MG tablet Take 1 tablet by mouth daily. 02/19/24  Yes [provider]  LUTEIN PO Take by mouth.   Yes [provider]  metoprolol  succinate (TOPROL -XL) 25 MG 24 hr tablet Take 12.5-25 mg by mouth daily.   Yes [provider]  mupirocin ointment (BACTROBAN) 2 % Apply topically. 10/25/23  Yes [provider]  Omega-3 Fatty Acids (FISH OIL) 1000 MG CAPS Take 1,000 mg by mouth daily.   Yes [provider]  VITAMIN A PO Take by mouth.   Yes [provider]  Vitamin D, Ergocalciferol, (DRISDOL) 1.25 MG (50000 UNIT) CAPS capsule Take 50,000 Units by mouth 2 (two) times a week. Monday and Thursday 12/25/22  Yes [provider]  acetaminophen  (TYLENOL ) 500 MG tablet Take 500-1,000 mg by mouth every 6 (six) hours as needed for moderate pain (pain score 4-6) or headache.    [provider]  latanoprost  (XALATAN ) 0.005 % ophthalmic solution Place 1 drop into both eyes at bedtime. 05/03/15   [provider]  miconazole  (MICONAZOLE  ANTIFUNGAL) 2 % cream Apply 1 application topically 2 (two) times daily. For 4-6 weeks 11/22/21   Daesha Insco V, MD  nitroGLYCERIN  (NITROSTAT ) 0.4 MG SL tablet Place 1 tablet (0.4 mg total) under the tongue every 5  (five) minutes x 3 doses as needed for chest pain. 06/30/15   Love, Sharlet RAMAN, PA-C  ondansetron  (ZOFRAN -ODT) 4 MG disintegrating tablet DISSOLVE 1 TABLET IN MOUTH EVERY 8 HOURS AS NEEDED FOR NAUSEA FOR VOMITING 01/09/22   Labib Cwynar V, MD  prasugrel  (EFFIENT ) 10 MG TABS tablet Take 1 tablet (10 mg total) by mouth at bedtime. Resume 48 hours after surgery 04/03/24   Ricky Fines, MD  torsemide (DEMADEX) 20 MG tablet Take 20 mg by mouth 2 (two) times daily.    [provider]    Current Outpatient Medications  Medication Sig Dispense Refill   amLODipine  (NORVASC ) 5 MG tablet Take 5 mg by mouth daily.     aspirin  EC 81 MG tablet Take 1 tablet (81 mg total) by mouth at bedtime.     Carboxymethylcellul-Glycerin (LUBRICATING EYE DROPS OP) Place 1 drop into both eyes daily as needed (dry eyes).     clindamycin (CLEOCIN) 150 MG capsule Take 150 mg by mouth every 6 (six) hours.     HYDROcodone-acetaminophen  (NORCO/VICODIN) 5-325 MG tablet Take 1 tablet by mouth every 8 (eight) hours as needed.     JANUVIA 100 MG tablet      ketoconazole (NIZORAL) 2 % shampoo Apply topically.     losartan  (COZAAR ) 100 MG tablet Take 1 tablet by mouth daily.     LUTEIN PO Take by mouth.     metoprolol  succinate (TOPROL -XL) 25 MG 24 hr tablet Take 12.5-25 mg by mouth daily.     mupirocin ointment (BACTROBAN) 2 % Apply topically.     Omega-3 Fatty Acids (FISH OIL) 1000 MG CAPS Take 1,000 mg by mouth daily.     VITAMIN A PO Take by mouth.     Vitamin D, Ergocalciferol, (DRISDOL) 1.25 MG (50000 UNIT) CAPS capsule Take 50,000 Units by mouth 2 (two) times a week. Monday and Thursday     acetaminophen  (TYLENOL ) 500 MG tablet Take 500-1,000 mg by mouth every 6 (six) hours as needed for moderate pain (pain score 4-6) or headache.     latanoprost  (XALATAN ) 0.005 % ophthalmic solution Place 1 drop into both eyes at bedtime.     miconazole  (MICONAZOLE  ANTIFUNGAL) 2 % cream Apply 1 application topically 2 (two) times  daily. For 4-6 weeks 28.35 g 0   nitroGLYCERIN  (NITROSTAT ) 0.4 MG SL tablet Place 1 tablet (0.4 mg total) under the tongue every 5 (five) minutes x 3 doses as needed for chest pain. 30 tablet 1   ondansetron  (ZOFRAN -ODT) 4 MG disintegrating tablet DISSOLVE 1 TABLET IN MOUTH EVERY 8 HOURS AS NEEDED FOR NAUSEA FOR VOMITING 30 tablet 0   prasugrel  (EFFIENT ) 10 MG TABS tablet Take 1 tablet (10 mg total) by mouth at bedtime. Resume 48 hours after surgery     torsemide (DEMADEX) 20 MG tablet Take 20 mg by mouth 2 (two) times daily.     Current Facility-Administered Medications  Medication Dose Route Frequency Provider Last Rate Last Admin   0.9 %  sodium chloride  infusion  500 mL Intravenous Once Ayleen Mckinstry V, MD        Allergies as of 05/19/2024 - Review Complete 05/19/2024  Allergen Reaction Noted   Tamsulosin  Other (See Comments) 09/24/2020   Ciprofloxacin  Itching 09/24/2020   Ibuprofen Diarrhea and Nausea Only 10/20/2019   Propoxyphene Itching 10/20/2019   Sulfa antibiotics Itching 06/10/2015    Family History  Problem Relation Age of Onset   Arthritis Mother    Hyperlipidemia Mother    Stroke Mother    Hypertension Mother    Ovarian cancer Mother    Hyperlipidemia Father    Heart disease Father    Hypertension Father    Non-Hodgkin's lymphoma Father    Congestive Heart Failure Brother    Hyperlipidemia Maternal Grandmother    Heart disease Maternal Grandmother    Stroke Maternal Grandmother    Hypertension Maternal Grandmother    Diabetes Maternal Grandmother    Hyperlipidemia Maternal Grandfather    Heart disease Maternal Grandfather    Hypertension Maternal Grandfather    Heart disease Paternal Grandmother    Heart disease Paternal Grandfather    Stroke Paternal Grandfather    Hypertension Paternal Grandfather    Colon cancer Neg Hx    Esophageal cancer Neg Hx    Pancreatic cancer Neg Hx    Stomach cancer Neg Hx    Liver disease Neg Hx    Colon polyps Neg Hx      Social History   Socioeconomic History   Marital status: Divorced    Spouse name: Not on file   Number of children: 2   Years of education: 16   Highest education level: Not on file  Occupational History   Occupation: Academic librarian    Comment: Retired  Tobacco Use   Smoking status: Former    Types: Cigarettes   Smokeless tobacco: Never   Tobacco comments:    quit 1987  Vaping Use   Vaping status: Never Used  Substance and Sexual Activity   Alcohol  use: No   Drug use: No   Sexual activity: Not Currently    Partners: Female  Other Topics Concern   Not on file  Social History Narrative   Fun: Paint, cook, travel   Denies religious beliefs effecting health care.    Social Drivers of Corporate investment banker Strain: Not on file  Food Insecurity: No Food Insecurity (03/30/2024)   Hunger Vital Sign    Worried About Running Out of Food in the Last Year: Never true    Ran Out of Food in the Last Year: Never true  Transportation Needs: No Transportation Needs (03/30/2024)   PRAPARE - Administrator, Civil Service (Medical): No    Lack of Transportation (Non-Medical): No  Physical Activity: Not on file  Stress: Not on file  Social Connections: Moderately Integrated (03/30/2024)   Social Connection and Isolation Panel    Frequency of Communication with Friends and Family: More than three times a week    Frequency of Social Gatherings with Friends and Family: More than three times a week    Attends Religious Services: More than 4 times per year    Active Member of [employer] or Organizations: Yes    Attends Banker Meetings: More than 4 times per year    Marital Status: Divorced  Intimate Partner Violence: Not At Risk (03/30/2024)   Humiliation, Afraid, Rape,  and Kick questionnaire    Fear of Current or Ex-Partner: No    Emotionally Abused: No    Physically Abused: No    Sexually Abused: No    Review of Systems:  All other review of  systems negative except as mentioned in the HPI.  Physical Exam: Vital signs in last 24 hours: BP 130/75   Pulse 84   Temp 98.6 F (37 C)   Ht 5' 11 (1.803 m)   Wt (!) 302 lb (137 kg)   SpO2 97%   BMI 42.12 kg/m  General:   Alert, NAD Lungs:  Clear .   Heart:  Regular rate and rhythm Abdomen:  Soft, nontender and nondistended. Neuro/Psych:  Alert and cooperative. Normal mood and affect. A and O x 3  Reviewed labs, radiology imaging, old records and pertinent past GI work up  Patient is appropriate for planned procedure(s) and anesthesia in an ambulatory setting   K. Veena Temima Kutsch , MD [phone]

## 2024-05-20 ENCOUNTER — Telehealth: Payer: Self-pay

## 2024-05-20 NOTE — Telephone Encounter (Signed)
 Unable to reach pt.  Left HIPAA compliant voicemail.

## 2024-05-25 LAB — SURGICAL PATHOLOGY

## 2024-05-27 ENCOUNTER — Ambulatory Visit: Admitting: Pharmacist

## 2024-05-29 ENCOUNTER — Ambulatory Visit: Admitting: Pharmacist

## 2024-06-08 ENCOUNTER — Telehealth: Payer: Self-pay | Admitting: Gastroenterology
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
## 2024-06-08 NOTE — Telephone Encounter (Signed)
 Patient called and stated that he is having rectal bleeding again and is not sure why he is bleeding. Patient is requesting to speak to the nurse or Dr. Shila in regards to what his next step would be. Please advise.

## 2024-06-09 NOTE — Telephone Encounter (Signed)
 Patient states he has not seen any more blood in the stools. He has some low abdominal cramping like he would feel if he was going to pass stool. He feels more positive about how he feels right now. Appointment scheduled. Declines labs at this time. Agrees to call if he acutely worsens. He may cancel if he continues to improve.

## 2024-06-10 ENCOUNTER — Encounter: Payer: Self-pay | Admitting: Gastroenterology

## 2024-06-10 ENCOUNTER — Ambulatory Visit (INDEPENDENT_AMBULATORY_CARE_PROVIDER_SITE_OTHER): Admitting: Gastroenterology

## 2024-06-10 ENCOUNTER — Other Ambulatory Visit: Payer: Self-pay | Admitting: *Deleted

## 2024-06-10 ENCOUNTER — Ambulatory Visit: Payer: Self-pay | Admitting: Gastroenterology

## 2024-06-10 ENCOUNTER — Other Ambulatory Visit (INDEPENDENT_AMBULATORY_CARE_PROVIDER_SITE_OTHER)

## 2024-06-10 VITALS — BP 138/88 | HR 68 | Ht 71.0 in | Wt 296.4 lb

## 2024-06-10 DIAGNOSIS — K625 Hemorrhage of anus and rectum: Secondary | ICD-10-CM

## 2024-06-10 DIAGNOSIS — R1032 Left lower quadrant pain: Secondary | ICD-10-CM | POA: Diagnosis not present

## 2024-06-10 LAB — CBC WITH DIFFERENTIAL/PLATELET
Basophils Absolute: 0.1 K/uL (ref 0.0–0.1)
Basophils Relative: 0.8 % (ref 0.0–3.0)
Eosinophils Absolute: 0.2 K/uL (ref 0.0–0.7)
Eosinophils Relative: 1.3 % (ref 0.0–5.0)
HCT: 44.9 % (ref 39.0–52.0)
Hemoglobin: 14.2 g/dL (ref 13.0–17.0)
Lymphocytes Relative: 17.7 % (ref 12.0–46.0)
Lymphs Abs: 2.9 K/uL (ref 0.7–4.0)
MCHC: 31.6 g/dL (ref 30.0–36.0)
MCV: 86.1 fl (ref 78.0–100.0)
Monocytes Absolute: 1.3 K/uL — ABNORMAL HIGH (ref 0.1–1.0)
Monocytes Relative: 7.8 % (ref 3.0–12.0)
Neutro Abs: 11.7 K/uL — ABNORMAL HIGH (ref 1.4–7.7)
Neutrophils Relative %: 72.4 % (ref 43.0–77.0)
Platelets: 312 K/uL (ref 150.0–400.0)
RBC: 5.22 Mil/uL (ref 4.22–5.81)
RDW: 14.8 % (ref 11.5–15.5)
WBC: 16.1 K/uL — ABNORMAL HIGH (ref 4.0–10.5)

## 2024-06-10 MED ORDER — AMOXICILLIN-POT CLAVULANATE 875-125 MG PO TABS: 1.0000 | ORAL_TABLET | Freq: Two times a day (BID) | ORAL | 0 refills | Status: AC

## 2024-06-10 NOTE — Progress Notes (Addendum)
 06/10/2024 Sergio Griffin [id number] [birth year]   HISTORY OF PRESENT ILLNESS: This is a 73 year old male who is a patient of Dr. Trenna.  Just had a colonoscopy earlier this month as below.  He is here today with complaints of rectal bleeding and abdominal pain.  He says that this began last Thursday evening.  Started with red blood on the toilet paper and then progressed to a few days of clots in the stool.  Also then started having left lower quadrant abdominal pain that felt like he got kicked in the abdomen.  Called our office and spoke with nursing staff and was given this appointment.  At this point he is feeling a little bit better.  The blood is now darker colored.  He had a good bowel movement yesterday.  Pain still present, but not quite as severe.  Describes a sensation as an intense pressure.  States that sometimes he has the urge that he has to have a bowel movement, but nothing comes out.  Hgb 05/28/2024 was 15.3 grams on labs that he brought to be today.  Colonoscopy 05/2024:  - Two 4 to 6 mm polyps in the transverse colon and in the ascending colon, removed with a cold snare. Resected and retrieved. - Severe diverticulosis in the sigmoid colon and in the descending colon. There was narrowing of the colon in association with the diverticular opening. There was evidence of diverticular spasm. Peri- diverticular erythema was seen. There was evidence of an impacted diverticulum. - Non- bleeding external and internal hemorrhoids.  Past Medical History:  Diagnosis Date   Anxiety    Barrett's esophagus    CAD (coronary artery disease)    PCI, stent 2011   Cardiac arrhythmia    Chicken pox    Depression    Diabetes mellitus without complication (HCC)    Diverticulitis    Diverticulosis    Gastric ulcer    GERD (gastroesophageal reflux disease)    Glaucoma    Heart attack (HCC) 01/29/2019   History of myocardial infarction    Hyperlipidemia    Hypertension    Kidney  stones    Kidney stones    Pneumonia 11/2014   Prostatitis    Reflux    Sleep apnea    UTI (lower urinary tract infection)    Past Surgical History:  Procedure Laterality Date   cardiac stents     last one 01/29/2019   CYSTOSCOPY/URETEROSCOPY/HOLMIUM LASER/STENT PLACEMENT Left 10/22/2019   Procedure: CYSTOSCOPY/URETEROSCOPY/HOLMIUM LASER/STENT PLACEMENT;  Surgeon: Cam Morene ORN, MD;  Location: [facility];  Service: Urology;  Laterality: Left;   ESOPHAGEAL MANOMETRY N/A 09/11/2017   Procedure: ESOPHAGEAL MANOMETRY (EM);  Surgeon: Shila Gustav GAILS, MD;  Location: [facility];  Service: Endoscopy;  Laterality: N/A;   EYE SURGERY Left    x 4   EYE SURGERY Right    x 3    reports that he has quit smoking. His smoking use included cigarettes. He has never used smokeless tobacco. He reports that he does not drink alcohol  and does not use drugs. family history includes Arthritis in his mother; Congestive Heart Failure in his brother; Diabetes in his maternal grandmother; Heart disease in his father, maternal grandfather, maternal grandmother, paternal grandfather, and paternal grandmother; Hyperlipidemia in his father, maternal grandfather, maternal grandmother, and mother; Hypertension in his father, maternal grandfather, maternal grandmother, mother, and paternal grandfather; Non-Hodgkin's lymphoma in his father; Ovarian cancer in his mother; Stroke in his maternal grandmother, mother, and paternal  grandfather. Allergies  Allergen Reactions   Tamsulosin  Other (See Comments)    Light headed and pass out   Ciprofloxacin  Itching   Ibuprofen Diarrhea and Nausea Only   Propoxyphene Itching    Darvocet   Sulfa Antibiotics Itching      Outpatient Encounter Medications as of 06/10/2024  Medication Sig   acetaminophen  (TYLENOL ) 500 MG tablet Take 500-1,000 mg by mouth every 6 (six) hours as needed for moderate pain (pain score 4-6) or headache.   amLODipine  (NORVASC ) 5 MG tablet Take 5 mg  by mouth daily.   aspirin  EC 81 MG tablet Take 1 tablet (81 mg total) by mouth at bedtime.   Carboxymethylcellul-Glycerin (LUBRICATING EYE DROPS OP) Place 1 drop into both eyes daily as needed (dry eyes).   JANUVIA 100 MG tablet    ketoconazole (NIZORAL) 2 % shampoo Apply topically.   latanoprost  (XALATAN ) 0.005 % ophthalmic solution Place 1 drop into both eyes at bedtime.   losartan  (COZAAR ) 100 MG tablet Take 1 tablet by mouth daily.   LUTEIN PO Take by mouth.   metoprolol  succinate (TOPROL -XL) 25 MG 24 hr tablet Take 12.5-25 mg by mouth daily.   miconazole  (MICONAZOLE  ANTIFUNGAL) 2 % cream Apply 1 application topically 2 (two) times daily. For 4-6 weeks   mupirocin ointment (BACTROBAN) 2 % Apply topically.   nitroGLYCERIN  (NITROSTAT ) 0.4 MG SL tablet Place 1 tablet (0.4 mg total) under the tongue every 5 (five) minutes x 3 doses as needed for chest pain.   Omega-3 Fatty Acids (FISH OIL) 1000 MG CAPS Take 1,000 mg by mouth daily.   ondansetron  (ZOFRAN -ODT) 4 MG disintegrating tablet DISSOLVE 1 TABLET IN MOUTH EVERY 8 HOURS AS NEEDED FOR NAUSEA FOR VOMITING   prasugrel  (EFFIENT ) 10 MG TABS tablet Take 1 tablet (10 mg total) by mouth at bedtime. Resume 48 hours after surgery   torsemide (DEMADEX) 20 MG tablet Take 20 mg by mouth 2 (two) times daily.   VITAMIN A PO Take by mouth.   Vitamin D, Ergocalciferol, (DRISDOL) 1.25 MG (50000 UNIT) CAPS capsule Take 50,000 Units by mouth 2 (two) times a week. Monday and Thursday   [DISCONTINUED] clindamycin (CLEOCIN) 150 MG capsule Take 150 mg by mouth every 6 (six) hours.   [DISCONTINUED] HYDROcodone-acetaminophen  (NORCO/VICODIN) 5-325 MG tablet Take 1 tablet by mouth every 8 (eight) hours as needed.   No facility-administered encounter medications on file as of 06/10/2024.     REVIEW OF SYSTEMS  : All other systems reviewed and negative except where noted in the History of Present Illness.   PHYSICAL EXAM: BP 138/88   Pulse 68   Ht 5' 11 (1.803  m)   Wt 296 lb 6 oz (134.4 kg)   BMI 41.34 kg/m  General: Well developed white male in no acute distress Head: Normocephalic and atraumatic Eyes:  Sclerae anicteric, conjunctiva pink. Ears: Normal auditory acuity Lungs: Clear throughout to auscultation; no W/R/R. Heart: Regular rate and rhythm; no M/R/G. Abdomen: Soft, non-distended.  BS present.  Diffuse TTP> on the left side. Musculoskeletal: Symmetrical with no gross deformities  Skin: No lesions on visible extremities Neurological: Alert oriented x 4, grossly non-focal Psychological:  Alert and cooperative. Normal mood and affect  ASSESSMENT AND PLAN: *Left lower quadrant abdominal pain and lower GI bleed that began last Thursday/Friday: Bleeding is resolving, pain still present but not quite as severe.  Suspect diverticulitis/diverticular bleed.  -Will check CBC today. - Will treat with Augmentin  875 mg twice daily for a week. -  Need to keep stools soft due to finding of narrowing in the colon related to diverticular disease.  Suggested MiraLAX for now.  Once acute issues resolved recommend increasing dietary fiber as well. - He will call or message with an update early next week.   CC:  Milana Sharper, MD

## 2024-06-10 NOTE — Patient Instructions (Signed)
 Your provider has requested that you go to the basement level for lab work before leaving today. Press B on the elevator. The lab is located at the first door on the left as you exit the elevator.  We have sent the following medications to your pharmacy for you to pick up at your convenience: Augmentin  875 mg twice daily 7 days.   Start Miralax 1 capful daily in 8 ounces of liquid.  Call or send Mychart message with an update in a week.   _______________________________________________________  If your blood pressure at your visit was 140/90 or greater, please contact your primary care physician to follow up on this.  _______________________________________________________  If you are age 74 or older, your body mass index should be between 23-30. Your Body mass index is 41.34 kg/m. If this is out of the aforementioned range listed, please consider follow up with your Primary Care Provider.  If you are age 74 or younger, your body mass index should be between 19-25. Your Body mass index is 41.34 kg/m. If this is out of the aformentioned range listed, please consider follow up with your Primary Care Provider.   ________________________________________________________  The [facility] providers would like to encourage you to use MYCHART to communicate with providers for non-urgent requests or questions.  Due to long hold times on the telephone, sending your provider a message by [facility] may be a faster and more efficient way to get a response.  Please allow 48 business hours for a response.  Please remember that this is for non-urgent requests.  _______________________________________________________  [facility] is using a team-based approach to care.  Your team is made up of your doctor and two to three APPS. Our APPS (Nurse Practitioners and Physician Assistants) work with your physician to ensure care continuity for you. They are fully qualified to address your health  concerns and develop a treatment plan. They communicate directly with your gastroenterologist to care for you. Seeing the Advanced Practice Practitioners on your physician's team can help you by facilitating care more promptly, often allowing for earlier appointments, access to diagnostic testing, procedures, and other specialty referrals.

## 2024-06-23 ENCOUNTER — Ambulatory Visit: Admitting: Physician Assistant

## 2024-07-09 ENCOUNTER — Ambulatory Visit: Admitting: Pharmacist

## 2024-07-21 ENCOUNTER — Telehealth: Payer: Self-pay | Admitting: Gastroenterology

## 2024-07-21 DIAGNOSIS — K625 Hemorrhage of anus and rectum: Secondary | ICD-10-CM

## 2024-07-21 NOTE — Telephone Encounter (Signed)
 Inbound call from patient stating that about a week or twice ago he experienced bright red blood whenever he wipes and he is still having that. Patient is worried and is requesting to have a nurse call him to advise him on what he needs to do. Please advise.

## 2024-07-21 NOTE — Telephone Encounter (Signed)
 Spoke with pt. He reports that yesterday he noted some maroon colored blood while wiping. Today he reports about an hour after eating breakfast he felt hot and nauseated. Reports went to bathroom and noted brbpr. It was on the toilet paper and in the bowl. He is unsure how much, 'but it was diarrhea mixed with blood and it felt like about 1 cupful. He denies any dizziness or lightheadedness. Pt states he is possibly coming to [facility] tomorrow if the provider would like any blood work. Discussed symptoms that would require an ER visit. Pt verbalized understanding.

## 2024-07-22 ENCOUNTER — Telehealth: Payer: Self-pay | Admitting: Gastroenterology

## 2024-07-22 ENCOUNTER — Other Ambulatory Visit (INDEPENDENT_AMBULATORY_CARE_PROVIDER_SITE_OTHER)

## 2024-07-22 DIAGNOSIS — K625 Hemorrhage of anus and rectum: Secondary | ICD-10-CM | POA: Diagnosis not present

## 2024-07-22 LAB — CBC WITH DIFFERENTIAL/PLATELET
Basophils Absolute: 0 K/uL (ref 0.0–0.1)
Basophils Relative: 0.1 % (ref 0.0–3.0)
Eosinophils Absolute: 0.1 K/uL (ref 0.0–0.7)
Eosinophils Relative: 0.4 % (ref 0.0–5.0)
HCT: 47 % (ref 39.0–52.0)
Hemoglobin: 14.7 g/dL (ref 13.0–17.0)
Lymphocytes Relative: 11.1 % — ABNORMAL LOW (ref 12.0–46.0)
Lymphs Abs: 2.1 K/uL (ref 0.7–4.0)
MCHC: 31.4 g/dL (ref 30.0–36.0)
MCV: 85.1 fl (ref 78.0–100.0)
Monocytes Absolute: 1.5 K/uL — ABNORMAL HIGH (ref 0.1–1.0)
Monocytes Relative: 7.9 % (ref 3.0–12.0)
Neutro Abs: 15 K/uL — ABNORMAL HIGH (ref 1.4–7.7)
Neutrophils Relative %: 80.5 % — ABNORMAL HIGH (ref 43.0–77.0)
Platelets: 312 K/uL (ref 150.0–400.0)
RBC: 5.52 Mil/uL (ref 4.22–5.81)
RDW: 15.3 % (ref 11.5–15.5)
WBC: 18.7 K/uL (ref 4.0–10.5)

## 2024-07-22 NOTE — Telephone Encounter (Signed)
 Patient returned call, requesting a call back. Please advise.

## 2024-07-22 NOTE — Telephone Encounter (Signed)
 Received a call from Carmena in the lab with a critical WBC count of 18.9.

## 2024-07-22 NOTE — Telephone Encounter (Signed)
 Attempted to reach pt to discuss elevated WBC. No answer, left vm for pt to return call.

## 2024-07-22 NOTE — Telephone Encounter (Signed)
 Spoke with pt. He reports he is still having the bleeding every time he goes to the bathroom. He reports he has liquid blood mixed with stool as well as clots. He is still planning to come to [place] around 1 PM today.

## 2024-07-22 NOTE — Telephone Encounter (Signed)
 Called and spoke with pt. Orders placed for CBC, pt instructed he can come by to have his blood work done. Pt advised that if the bleeding continues, he needs to evaluated at the ER. He verbalized understanding.

## 2024-07-23 ENCOUNTER — Telehealth: Payer: Self-pay | Admitting: Cardiovascular Disease

## 2024-07-23 ENCOUNTER — Encounter (HOSPITAL_COMMUNITY): Payer: Self-pay

## 2024-07-23 ENCOUNTER — Ambulatory Visit: Payer: Self-pay | Admitting: Gastroenterology

## 2024-07-23 ENCOUNTER — Other Ambulatory Visit: Payer: Self-pay

## 2024-07-23 ENCOUNTER — Inpatient Hospital Stay (HOSPITAL_COMMUNITY)
Admission: EM | Admit: 2024-07-23 | Discharge: 2024-07-27 | DRG: 321 | Disposition: A | Discharge status: Home / Self Care | Attending: Internal Medicine | Admitting: Internal Medicine

## 2024-07-23 ENCOUNTER — Emergency Department (HOSPITAL_COMMUNITY)

## 2024-07-23 DIAGNOSIS — E119 Type 2 diabetes mellitus without complications: Secondary | ICD-10-CM | POA: Diagnosis not present

## 2024-07-23 DIAGNOSIS — E1122 Type 2 diabetes mellitus with diabetic chronic kidney disease: Secondary | ICD-10-CM | POA: Diagnosis present

## 2024-07-23 DIAGNOSIS — Z7902 Long term (current) use of antithrombotics/antiplatelets: Secondary | ICD-10-CM

## 2024-07-23 DIAGNOSIS — K529 Noninfective gastroenteritis and colitis, unspecified: Secondary | ICD-10-CM | POA: Diagnosis present

## 2024-07-23 DIAGNOSIS — I25110 Atherosclerotic heart disease of native coronary artery with unstable angina pectoris: Secondary | ICD-10-CM | POA: Diagnosis present

## 2024-07-23 DIAGNOSIS — N200 Calculus of kidney: Secondary | ICD-10-CM | POA: Diagnosis present

## 2024-07-23 DIAGNOSIS — E872 Acidosis, unspecified: Secondary | ICD-10-CM | POA: Diagnosis present

## 2024-07-23 DIAGNOSIS — Z83438 Family history of other disorder of lipoprotein metabolism and other lipidemia: Secondary | ICD-10-CM

## 2024-07-23 DIAGNOSIS — Z881 Allergy status to other antibiotic agents status: Secondary | ICD-10-CM

## 2024-07-23 DIAGNOSIS — N1831 Chronic kidney disease, stage 3a: Secondary | ICD-10-CM | POA: Diagnosis present

## 2024-07-23 DIAGNOSIS — Z638 Other specified problems related to primary support group: Secondary | ICD-10-CM

## 2024-07-23 DIAGNOSIS — Z833 Family history of diabetes mellitus: Secondary | ICD-10-CM

## 2024-07-23 DIAGNOSIS — Z885 Allergy status to narcotic agent status: Secondary | ICD-10-CM

## 2024-07-23 DIAGNOSIS — I252 Old myocardial infarction: Secondary | ICD-10-CM

## 2024-07-23 DIAGNOSIS — N179 Acute kidney failure, unspecified: Secondary | ICD-10-CM | POA: Diagnosis present

## 2024-07-23 DIAGNOSIS — Z807 Family history of other malignant neoplasms of lymphoid, hematopoietic and related tissues: Secondary | ICD-10-CM

## 2024-07-23 DIAGNOSIS — Z7982 Long term (current) use of aspirin: Secondary | ICD-10-CM

## 2024-07-23 DIAGNOSIS — Z87891 Personal history of nicotine dependence: Secondary | ICD-10-CM

## 2024-07-23 DIAGNOSIS — K649 Unspecified hemorrhoids: Secondary | ICD-10-CM | POA: Diagnosis present

## 2024-07-23 DIAGNOSIS — I5030 Unspecified diastolic (congestive) heart failure: Secondary | ICD-10-CM | POA: Diagnosis not present

## 2024-07-23 DIAGNOSIS — I200 Unstable angina: Secondary | ICD-10-CM

## 2024-07-23 DIAGNOSIS — E782 Mixed hyperlipidemia: Secondary | ICD-10-CM | POA: Diagnosis present

## 2024-07-23 DIAGNOSIS — Z886 Allergy status to analgesic agent status: Secondary | ICD-10-CM

## 2024-07-23 DIAGNOSIS — D72829 Elevated white blood cell count, unspecified: Secondary | ICD-10-CM

## 2024-07-23 DIAGNOSIS — E875 Hyperkalemia: Secondary | ICD-10-CM | POA: Diagnosis present

## 2024-07-23 DIAGNOSIS — I2510 Atherosclerotic heart disease of native coronary artery without angina pectoris: Secondary | ICD-10-CM

## 2024-07-23 DIAGNOSIS — Z7984 Long term (current) use of oral hypoglycemic drugs: Secondary | ICD-10-CM

## 2024-07-23 DIAGNOSIS — Z79899 Other long term (current) drug therapy: Secondary | ICD-10-CM

## 2024-07-23 DIAGNOSIS — K625 Hemorrhage of anus and rectum: Secondary | ICD-10-CM | POA: Diagnosis present

## 2024-07-23 DIAGNOSIS — K5731 Diverticulosis of large intestine without perforation or abscess with bleeding: Secondary | ICD-10-CM | POA: Diagnosis present

## 2024-07-23 DIAGNOSIS — I25119 Atherosclerotic heart disease of native coronary artery with unspecified angina pectoris: Secondary | ICD-10-CM | POA: Diagnosis not present

## 2024-07-23 DIAGNOSIS — K219 Gastro-esophageal reflux disease without esophagitis: Secondary | ICD-10-CM | POA: Diagnosis present

## 2024-07-23 DIAGNOSIS — Y831 Surgical operation with implant of artificial internal device as the cause of abnormal reaction of the patient, or of later complication, without mention of misadventure at the time of the procedure: Secondary | ICD-10-CM | POA: Diagnosis present

## 2024-07-23 DIAGNOSIS — I998 Other disorder of circulatory system: Secondary | ICD-10-CM | POA: Diagnosis present

## 2024-07-23 DIAGNOSIS — Z823 Family history of stroke: Secondary | ICD-10-CM

## 2024-07-23 DIAGNOSIS — Z6841 Body Mass Index (BMI) 40.0 and over, adult: Secondary | ICD-10-CM

## 2024-07-23 DIAGNOSIS — I5189 Other ill-defined heart diseases: Secondary | ICD-10-CM

## 2024-07-23 DIAGNOSIS — T82855A Stenosis of coronary artery stent, initial encounter: Principal | ICD-10-CM | POA: Diagnosis present

## 2024-07-23 DIAGNOSIS — Z8711 Personal history of peptic ulcer disease: Secondary | ICD-10-CM

## 2024-07-23 DIAGNOSIS — Z882 Allergy status to sulfonamides status: Secondary | ICD-10-CM

## 2024-07-23 DIAGNOSIS — R072 Precordial pain: Secondary | ICD-10-CM | POA: Diagnosis not present

## 2024-07-23 DIAGNOSIS — Z8261 Family history of arthritis: Secondary | ICD-10-CM

## 2024-07-23 DIAGNOSIS — Z955 Presence of coronary angioplasty implant and graft: Secondary | ICD-10-CM

## 2024-07-23 DIAGNOSIS — R079 Chest pain, unspecified: Secondary | ICD-10-CM | POA: Diagnosis present

## 2024-07-23 DIAGNOSIS — R1310 Dysphagia, unspecified: Secondary | ICD-10-CM

## 2024-07-23 DIAGNOSIS — I700 Atherosclerosis of aorta: Secondary | ICD-10-CM | POA: Diagnosis present

## 2024-07-23 DIAGNOSIS — Z8249 Family history of ischemic heart disease and other diseases of the circulatory system: Secondary | ICD-10-CM

## 2024-07-23 DIAGNOSIS — I129 Hypertensive chronic kidney disease with stage 1 through stage 4 chronic kidney disease, or unspecified chronic kidney disease: Secondary | ICD-10-CM | POA: Diagnosis present

## 2024-07-23 DIAGNOSIS — N2889 Other specified disorders of kidney and ureter: Secondary | ICD-10-CM | POA: Diagnosis present

## 2024-07-23 LAB — CBC WITH DIFFERENTIAL/PLATELET
Abs Immature Granulocytes: 0.64 K/uL — ABNORMAL HIGH (ref 0.00–0.07)
Basophils Absolute: 0.1 K/uL (ref 0.0–0.1)
Basophils Relative: 1 %
Eosinophils Absolute: 0.1 K/uL (ref 0.0–0.5)
Eosinophils Relative: 1 %
HCT: 47 % (ref 39.0–52.0)
Hemoglobin: 16 g/dL (ref 13.0–17.0)
Immature Granulocytes: 3 %
Lymphocytes Relative: 10 %
Lymphs Abs: 2.2 K/uL (ref 0.7–4.0)
MCH: 26.9 pg (ref 26.0–34.0)
MCHC: 30.4 g/dL (ref 30.0–36.0)
MCV: 88.5 fL (ref 80.0–100.0)
Monocytes Absolute: 1.1 K/uL — ABNORMAL HIGH (ref 0.1–1.0)
Monocytes Relative: 5 %
Neutro Abs: 19.2 K/uL — ABNORMAL HIGH (ref 1.7–7.7)
Neutrophils Relative %: 80 %
Platelets: 340 K/uL (ref 150–400)
RBC: 5.5 MIL/uL (ref 4.22–5.81)
RDW: 15.4 % (ref 11.5–15.5)
WBC: 23.4 K/uL — ABNORMAL HIGH (ref 4.0–10.5)
nRBC: 0 % (ref 0.0–0.2)

## 2024-07-23 LAB — COMPREHENSIVE METABOLIC PANEL WITH GFR
ALT: 44 U/L (ref 0–44)
AST: 30 U/L (ref 15–41)
Albumin: 3.4 g/dL — ABNORMAL LOW (ref 3.5–5.0)
Alkaline Phosphatase: 62 U/L (ref 38–126)
Anion gap: 12 (ref 5–15)
BUN: 39 mg/dL — ABNORMAL HIGH (ref 8–23)
CO2: 21 mmol/L — ABNORMAL LOW (ref 22–32)
Calcium: 9.7 mg/dL (ref 8.9–10.3)
Chloride: 106 mmol/L (ref 98–111)
Creatinine, Ser: 1.46 mg/dL — ABNORMAL HIGH (ref 0.61–1.24)
GFR, Estimated: 50 mL/min — ABNORMAL LOW (ref >60)
Glucose, Bld: 203 mg/dL — ABNORMAL HIGH (ref 70–99)
Potassium: 5.4 mmol/L — ABNORMAL HIGH (ref 3.5–5.1)
Sodium: 139 mmol/L (ref 135–145)
Total Bilirubin: 1 mg/dL (ref 0.0–1.2)
Total Protein: 6.3 g/dL — ABNORMAL LOW (ref 6.5–8.1)

## 2024-07-23 LAB — TYPE AND SCREEN
ABO/RH(D): O POS
Antibody Screen: NEGATIVE

## 2024-07-23 LAB — TROPONIN I (HIGH SENSITIVITY)
Troponin I (High Sensitivity): 8 ng/L (ref <18)
Troponin I (High Sensitivity): 7 ng/L (ref <18)

## 2024-07-23 LAB — I-STAT CG4 LACTIC ACID, ED: Lactic Acid, Venous: 3.3 mmol/L (ref 0.5–1.9)

## 2024-07-23 LAB — LACTIC ACID, PLASMA: Lactic Acid, Venous: 1.4 mmol/L (ref 0.5–1.9)

## 2024-07-23 LAB — CBG MONITORING, ED: Glucose-Capillary: 197 mg/dL — ABNORMAL HIGH (ref 70–99)

## 2024-07-23 LAB — LIPASE, BLOOD: Lipase: 42 U/L (ref 11–51)

## 2024-07-23 MED ORDER — LACTATED RINGERS IV BOLUS
1000.0000 mL | Freq: Once | INTRAVENOUS | Status: AC
  Administered 2024-07-23: 1000 mL via INTRAVENOUS

## 2024-07-23 MED ORDER — IOHEXOL 350 MG/ML SOLN
100.0000 mL | Freq: Once | INTRAVENOUS | Status: AC | PRN
  Administered 2024-07-23: 100 mL via INTRAVENOUS

## 2024-07-23 MED ORDER — NITROGLYCERIN 0.4 MG SL SUBL
0.4000 mg | SUBLINGUAL_TABLET | SUBLINGUAL | Status: DC | PRN
  Administered 2024-07-23 – 2024-07-24 (×3): 0.4 mg via SUBLINGUAL
  Filled 2024-07-23 (×4): qty 1

## 2024-07-23 MED ORDER — ASPIRIN 81 MG PO CHEW
324.0000 mg | CHEWABLE_TABLET | Freq: Once | ORAL | Status: AC
  Administered 2024-07-23: 324 mg via ORAL
  Filled 2024-07-23: qty 4

## 2024-07-23 NOTE — Telephone Encounter (Signed)
 Spoke with pt, he reports for 3 weeks now he has had chest pain. He reports this morning he walked to the mailbox, got unusually SOB and felt uncomfortable. When he got back in the house, he developed chest pain. He thought it maybe indigestion so he took 5 antiacids with no relief. He was not able to get it to settle down until he sat down and did deep breathing. He also reports his pulse is funny and he is having skipped beats. He reports he feels nervous and anxious and that is not like him. Right now his discomfort is 2 out of 10, but he reports the chest pain was 8-9 and went up into his throat. Patient advised to go to the ER for evaluation.

## 2024-07-23 NOTE — Telephone Encounter (Signed)
 Pt c/o of Chest Pain: STAT if active CP, including tightness, pressure, jaw pain, radiating pain to shoulder/upper arm/back, CP unrelieved by Nitro. Symptoms reported of SOB, nausea, vomiting, sweating.   1. Are you having CP right now? No, this morning but subsided now     2. Are you experiencing any other symptoms (ex. SOB, nausea, vomiting, sweating)? SOB, sweating     3. Is your CP continuous or coming and going? Coming and going     4. Have you taken Nitroglycerin ? No     5. How long have you been experiencing CP? About 3 weeks intermittently      6. If NO CP at time of call then end call with telling Pt to call back or call 911 if Chest pain returns prior to return call from triage team.

## 2024-07-23 NOTE — ED Provider Notes (Signed)
 [place] [facility] Provider Note HPI Sergio Griffin is a 73 y.o. male with a PMH of CAD status post stenting (2011, 2019, 2020), diverticulosis, type 2 diabetes, HLD, HTN, gastric reflux who presents the emergency department with chest pain.  The patient noted the onset of chest pain approximately 8:30 AM this morning.  It was worse with exertion.  Is associated with diaphoresis.  The pain radiates straight to his back.  He also had associated nausea.  Of note, the patient has had multiple episodes of bright red blood per stool at home that he attributes to diverticulosis.  Patient is on aspirin  and prasugrel .  Denies cough, vomiting, fevers.  He has been having some diarrhea over the last few days  Past Medical History:  Diagnosis Date   Anxiety    Barrett's esophagus    CAD (coronary artery disease)    PCI, stent 2011   Cardiac arrhythmia    Chicken pox    Depression    Diabetes mellitus without complication (HCC)    Diverticulitis    Diverticulosis    Gastric ulcer    GERD (gastroesophageal reflux disease)    Glaucoma    Heart attack (HCC) 01/29/2019   History of myocardial infarction    Hyperlipidemia    Hypertension    Kidney stones    Kidney stones    Pneumonia 11/2014   Prostatitis    Reflux    Sleep apnea    UTI (lower urinary tract infection)    Past Surgical History:  Procedure Laterality Date   cardiac stents     last one 01/29/2019   CYSTOSCOPY/URETEROSCOPY/HOLMIUM LASER/STENT PLACEMENT Left 10/22/2019   Procedure: CYSTOSCOPY/URETEROSCOPY/HOLMIUM LASER/STENT PLACEMENT;  Surgeon: Cam Morene ORN, MD;  Location: [facility];  Service: Urology;  Laterality: Left;   ESOPHAGEAL MANOMETRY N/A 09/11/2017   Procedure: ESOPHAGEAL MANOMETRY (EM);  Surgeon: Shila Gustav GAILS, MD;  Location: [facility];  Service: Endoscopy;  Laterality: N/A;   EYE SURGERY Left    x 4   EYE SURGERY Right    x 3    Review of Systems Pertinent  positives and negative findings are listed as part of the History of Present Illness and MDM  Physical Exam Vitals:   07/23/24 1945 07/23/24 2030 07/23/24 2045 07/23/24 2247  BP: (!) 150/65 (!) 167/84    Pulse: 67  70   Resp: (!) 22 19 16    Temp:    97.6 F (36.4 C)  TempSrc:    Oral  SpO2: 95% 97% 100%   Weight:      Height:         Constitutional Nursing notes reviewed Vital signs reviewed  HEENT No obvious trauma Pupils round, equal, and reactive to light. Pupils cross midline Neck supple  Respiratory Effort normal Breathing well on room air CTAB  CV Normal rate and rhythm  1+ pitting edema  Abdomen Soft, non-tender, non-distended No peritonitis  MSK Atraumatic No obvious deformity ROM appropriate  Neuro Awake and alert Pupils cross midline Moving all extremities    MDM:  Initial Differential Diagnoses includes ACS, aortic dissection, PE, diverticulitis, enteritis, diverticulosis  I reviewed the patient's vitals, the nursing triage note and evaluated the patient at bedside.   I reviewed the patient's external records which show that the patient has history of CAD status post tenting.  His last echo was approximately 9 months ago and showed LVEF of 60 to 65%.  He was admitted to the hospital for rectal  bleeding in May of this year and was seen by GI who recommended outpatient colonoscopy.    I considered ACS and reviewed/interpreted the EKG. EKG shows normal sinus rhythm with no evidence of acute ischemic changes or high grade conduction block. There is no STEMI or contiguous T wave inversions. No evidence of new-onset arrythmia. The PR, QRS, and QT intervals are normal. Troponin  8 --> 7.  Although this lowers my suspicion for ACS, the patient is at high risk for ACS given multiple prior MIs requiring stenting.  CTA chest abdomen pelvis obtained to evaluate for dissection which shows no evidence of acute aortic syndrome or other acute abnormalities.  Patient does  have a solid-appearing mass in the right kidney concerning for renal cell carcinoma.  This was seen on prior CTs.  Will discuss this finding with the patient and encouraged close follow-up with the PCP.  On reevaluation, patient has had resolution of his chest pain.  Labs do show leukocytosis to 23.4 of unclear etiology.  Patient states that he did get intramuscular steroid shot a week ago. He has no fevers, chills, cough or vomiting but does say that he has been having more diarrhea over the last few days.  I discussed the patient's case with cardiology and they recommend medicine admission given his rectal bleeding and leukocytosis.  Cardiology will plan for cardiac MRI or provocative testing tomorrow.  Patient admitted to medicine    Procedures: Procedures  Medications administered in the ED: Medications  nitroGLYCERIN  (NITROSTAT ) SL tablet 0.4 mg (0.4 mg Sublingual Given 07/23/24 1657)  aspirin  chewable tablet 324 mg (324 mg Oral Given 07/23/24 1624)  iohexol  (OMNIPAQUE ) 350 MG/ML injection 100 mL (100 mLs Intravenous Contrast Given 07/23/24 1841)  lactated ringers  bolus 1,000 mL (0 mLs Intravenous Stopped 07/23/24 2045)     Impression: 1. Precordial pain   2. Rectal bleeding   3. Leukocytosis, unspecified type      Patient's presentation is most consistent with acute presentation with potential threat to life or bodily function.  Disposition: ED Disposition:  Admit    ED Discharge Orders     None             Dionisio Blunt, MD 07/24/24 0014    Emil Share, DO 07/24/24 1504

## 2024-07-23 NOTE — ED Triage Notes (Signed)
 Patient reports chest pain that has been on and off for 6 weeks but really bad this morning that is now causing diaphoresis.  Also complains of shortness of breath.  Patient reports new dx DM and also complains of LUQ pain hx of diverticulitis but reports he has been having a GI bleed for the past few days passing clots but is maroon now.

## 2024-07-23 NOTE — H&P (Incomplete)
 History and Physical    Patient: Sergio Griffin FMW:[id number] DOB: 27-Sep-1951 DOA: 07/23/2024 DOS: the patient was seen and examined on 07/23/2024 PCP: Milana Sharper, MD  Patient coming from: Home  Chief Complaint:  Chief Complaint  Patient presents with   Chest Pain   HPI: Sergio Griffin is a 73 y.o. male with medical history significant of ***    Review of Systems: As mentioned in the history of present illness. All other systems reviewed and are negative. Past Medical History:  Diagnosis Date   Anxiety    Barrett's esophagus    CAD (coronary artery disease)    PCI, stent 2011   Cardiac arrhythmia    Chicken pox    Depression    Diabetes mellitus without complication (HCC)    Diverticulitis    Diverticulosis    Gastric ulcer    GERD (gastroesophageal reflux disease)    Glaucoma    Heart attack (HCC) 01/29/2019   History of myocardial infarction    Hyperlipidemia    Hypertension    Kidney stones    Kidney stones    Pneumonia 11/2014   Prostatitis    Reflux    Sleep apnea    UTI (lower urinary tract infection)    Past Surgical History:  Procedure Laterality Date   cardiac stents     last one 01/29/2019   CYSTOSCOPY/URETEROSCOPY/HOLMIUM LASER/STENT PLACEMENT Left 10/22/2019   Procedure: CYSTOSCOPY/URETEROSCOPY/HOLMIUM LASER/STENT PLACEMENT;  Surgeon: Cam Morene ORN, MD;  Location: [facility];  Service: Urology;  Laterality: Left;   ESOPHAGEAL MANOMETRY N/A 09/11/2017   Procedure: ESOPHAGEAL MANOMETRY (EM);  Surgeon: Shila Gustav GAILS, MD;  Location: [facility];  Service: Endoscopy;  Laterality: N/A;   EYE SURGERY Left    x 4   EYE SURGERY Right    x 3   Social History:  reports that he has quit smoking. His smoking use included cigarettes. He has never used smokeless tobacco. He reports that he does not drink alcohol  and does not use drugs.  Allergies  Allergen Reactions   Tamsulosin  Other (See Comments)    Light headed and pass out   Fluconazole  Other (See Comments)    Severe case of diarrhea, headache, gastric distress   Ciprofloxacin  Itching   Ibuprofen Diarrhea and Nausea Only   Propoxyphene Itching    Darvocet   Sulfa Antibiotics Itching    Family History  Problem Relation Age of Onset   Arthritis Mother    Hyperlipidemia Mother    Stroke Mother    Hypertension Mother    Ovarian cancer Mother    Hyperlipidemia Father    Heart disease Father    Hypertension Father    Non-Hodgkin's lymphoma Father    Congestive Heart Failure Brother    Hyperlipidemia Maternal Grandmother    Heart disease Maternal Grandmother    Stroke Maternal Grandmother    Hypertension Maternal Grandmother    Diabetes Maternal Grandmother    Hyperlipidemia Maternal Grandfather    Heart disease Maternal Grandfather    Hypertension Maternal Grandfather    Heart disease Paternal Grandmother    Heart disease Paternal Grandfather    Stroke Paternal Grandfather    Hypertension Paternal Grandfather    Colon cancer Neg Hx    Esophageal cancer Neg Hx    Pancreatic cancer Neg Hx    Stomach cancer Neg Hx    Liver disease Neg Hx    Colon polyps Neg Hx     Prior to Admission medications   Medication  Sig Start Date End Date Taking? Authorizing Provider  acetaminophen  (TYLENOL ) 500 MG tablet Take 500-1,000 mg by mouth every 6 (six) hours as needed for moderate pain (pain score 4-6) or headache.   Yes [provider]  aspirin  EC 81 MG tablet Take 1 tablet (81 mg total) by mouth at bedtime. 04/03/24  Yes Ricky Fines, MD  Carboxymethylcellul-Glycerin (LUBRICATING EYE DROPS OP) Place 1 drop into both eyes 3 (three) times daily as needed (dry eyes).   Yes [provider]  clotrimazole-betamethasone (LOTRISONE) cream Apply 1 Application topically 2 (two) times daily. 07/14/24  Yes [provider]  cycloSPORINE  (RESTASIS ) 0.05 % ophthalmic emulsion Place 1 drop into both eyes 2 (two) times daily.   Yes [provider]   JANUVIA 100 MG tablet Take 100 mg by mouth every morning. 10/28/23  Yes [provider]  KLAYESTA powder Apply 1 Application topically daily. 07/14/24  Yes [provider]  latanoprost  (XALATAN ) 0.005 % ophthalmic solution Place 1 drop into both eyes at bedtime. 05/03/15  Yes [provider]  losartan  (COZAAR ) 100 MG tablet Take 1 tablet by mouth at bedtime. 02/19/24  Yes [provider]  metoprolol  succinate (TOPROL -XL) 25 MG 24 hr tablet Take 25 mg by mouth daily. Take one tablet (25mg ) by mouth every morning.   Yes [provider]  nitroGLYCERIN  (NITROSTAT ) 0.4 MG SL tablet Place 1 tablet (0.4 mg total) under the tongue every 5 (five) minutes x 3 doses as needed for chest pain. 06/30/15  Yes Love, Sharlet RAMAN, PA-C  prasugrel  (EFFIENT ) 10 MG TABS tablet Take 1 tablet (10 mg total) by mouth at bedtime. Resume 48 hours after surgery 04/03/24  Yes Ricky Fines, MD  torsemide (DEMADEX) 20 MG tablet Take 20 mg by mouth 2 (two) times daily.    [provider]    Physical Exam: Vitals:   07/23/24 1945 07/23/24 2030 07/23/24 2045 07/23/24 2247  BP: (!) 150/65 (!) 167/84    Pulse: 67  70   Resp: (!) 22 19 16    Temp:    97.6 F (36.4 C)  TempSrc:    Oral  SpO2: 95% 97% 100%   Weight:      Height:       *** Data Reviewed: {Tip this will not be part of the note when signed- Document your independent interpretation of telemetry tracing, EKG, lab, Radiology test or any other diagnostic tests. Add any new diagnostic test ordered today. (Optional):[id number]} Results for orders placed or performed during the hospital encounter of 07/23/24 (from the past 24 hours)  CBC with Differential     Status: Abnormal   Collection Time: 07/23/24  4:09 PM  Result Value Ref Range   WBC 23.4 (H) 4.0 - 10.5 K/uL   RBC 5.50 4.22 - 5.81 MIL/uL   Hemoglobin 16.0 13.0 - 17.0 g/dL   HCT 52.9 60.9 - 47.9 %   MCV 88.5 80.0 - 100.0 fL   MCH 26.9 26.0 - 34.0 pg   MCHC 30.4  30.0 - 36.0 g/dL   RDW 84.5 88.4 - 84.4 %   Platelets 340 150 - 400 K/uL   nRBC 0.0 0.0 - 0.2 %   Neutrophils Relative % 80 %   Neutro Abs 19.2 (H) 1.7 - 7.7 K/uL   Lymphocytes Relative 10 %   Lymphs Abs 2.2 0.7 - 4.0 K/uL   Monocytes Relative 5 %   Monocytes Absolute 1.1 (H) 0.1 - 1.0 K/uL   Eosinophils Relative 1 %  Eosinophils Absolute 0.1 0.0 - 0.5 K/uL   Basophils Relative 1 %   Basophils Absolute 0.1 0.0 - 0.1 K/uL   Immature Granulocytes 3 %   Abs Immature Granulocytes 0.64 (H) 0.00 - 0.07 K/uL  Troponin I (High Sensitivity)     Status: None   Collection Time: 07/23/24  4:09 PM  Result Value Ref Range   Troponin I (High Sensitivity) 8 <18 ng/L  Lipase, blood     Status: None   Collection Time: 07/23/24  4:09 PM  Result Value Ref Range   Lipase 42 11 - 51 U/L  Comprehensive metabolic panel     Status: Abnormal   Collection Time: 07/23/24  4:09 PM  Result Value Ref Range   Sodium 139 135 - 145 mmol/L   Potassium 5.4 (H) 3.5 - 5.1 mmol/L   Chloride 106 98 - 111 mmol/L   CO2 21 (L) 22 - 32 mmol/L   Glucose, Bld 203 (H) 70 - 99 mg/dL   BUN 39 (H) 8 - 23 mg/dL   Creatinine, Ser 8.53 (H) 0.61 - 1.24 mg/dL   Calcium  9.7 8.9 - 10.3 mg/dL   Total Protein 6.3 (L) 6.5 - 8.1 g/dL   Albumin 3.4 (L) 3.5 - 5.0 g/dL   AST 30 15 - 41 U/L   ALT 44 0 - 44 U/L   Alkaline Phosphatase 62 38 - 126 U/L   Total Bilirubin 1.0 0.0 - 1.2 mg/dL   GFR, Estimated 50 (L) >60 mL/min   Anion gap 12 5 - 15  POC CBG, ED     Status: Abnormal   Collection Time: 07/23/24  4:14 PM  Result Value Ref Range   Glucose-Capillary 197 (H) 70 - 99 mg/dL  Type and screen [facility]     Status: None   Collection Time: 07/23/24  4:18 PM  Result Value Ref Range   ABO/RH(D) O POS    Antibody Screen NEG    Sample Expiration      07/26/2024,2359 Performed at [facility], [address] [place], [place] [address]   I-Stat CG4 Lactic Acid     Status: Abnormal   Collection Time:  07/23/24  4:21 PM  Result Value Ref Range   Lactic Acid, Venous 3.3 (HH) 0.5 - 1.9 mmol/L   Comment NOTIFIED PHYSICIAN   Troponin I (High Sensitivity)     Status: None   Collection Time: 07/23/24  7:45 PM  Result Value Ref Range   Troponin I (High Sensitivity) 7 <18 ng/L  Lactic acid, plasma     Status: None   Collection Time: 07/23/24  7:46 PM  Result Value Ref Range   Lactic Acid, Venous 1.4 0.5 - 1.9 mmol/L     Assessment and Plan: Chest pain   2. Rectal bleeding -   3. Elevated wbc -   4.DMT2 - Add correction dose insulin   5. 17mm renal mass - RCC suspected. Renal MRI recommended.     Advance Care Planning:   Code Status: Prior ***  Consults: cardiology  Family Communication: ***  Severity of Illness: The appropriate patient status for this patient is INPATIENT. Inpatient status is judged to be reasonable and necessary in order to provide the required intensity of service to ensure the patient's safety. The patient's presenting symptoms, physical exam findings, and initial radiographic and laboratory data in the context of their chronic comorbidities is felt to place them at high risk for further clinical deterioration. Furthermore, it is not  anticipated that the patient will be medically stable for discharge from the hospital within 2 midnights of admission.   * I certify that at the point of admission it is my clinical judgment that the patient will require inpatient hospital care spanning beyond 2 midnights from the point of admission due to high intensity of service, high risk for further deterioration and high frequency of surveillance required.*  Author: ARTHEA CHILD, MD 07/23/2024 11:54 PM  For on call review [email or web address].

## 2024-07-23 NOTE — ED Triage Notes (Signed)
 Pt here from home with c/o sob and chest pain while walking to the mail box , pt hx of 3 stents ,

## 2024-07-23 NOTE — ED Provider Triage Note (Signed)
 Emergency Medicine Provider Triage Evaluation Note  Sergio Griffin , a 73 y.o. male  was evaluated in triage.  Pt complains of chest pain.  Currently 3 out of 10 but constant pressure.  Does not radiate anywhere. associated shortness of breath.  Worse with exertion.  History of MI status post PCI's.  Feels similar to his prior MIs.  Has had some rectal bleeding over the past few days.  He states this has subsided today..  Review of Systems  Positive: As above Negative: As above  Physical Exam  BP (!) 158/78 (BP Location: Left Arm)   Pulse 82   Temp 97.7 F (36.5 C)   Resp (!) 22   Ht 5' 11 (1.803 m)   Wt 134.3 kg   SpO2 91%   BMI 41.28 kg/m  Gen:   Awake, no distress   Resp:  Normal effort  MSK:   Moves extremities without difficulty  Other:    Medical Decision Making  Medically screening exam initiated at 4:19 PM.  Appropriate orders placed.  DRAYK HUMBARGER was informed that the remainder of the evaluation will be completed by another provider, this initial triage assessment does not replace that evaluation, and the importance of remaining in the ED until their evaluation is complete.  Advised nursing he needs a room as soon as possible   Hildegard Loge, [place] 07/23/24 1620

## 2024-07-23 NOTE — ED Notes (Signed)
 Pt returned from xray c/o increased midupper back pain and requested another dose of nitroglycerin .

## 2024-07-23 NOTE — Telephone Encounter (Signed)
 Called and spoke with pt. Discussed elevated WBC count. Discussed provider recommendations regarding f/u. Pt reports I haven't had a bowel movement, but I did have red liquid come out.' Again talked with patient about going to ER due to continued rectal bleeding and now elevated WBC. Pt states he will go to ER for evaluation.

## 2024-07-23 NOTE — Consult Note (Signed)
 Cardiology Consultation   Patient ID: Sergio Griffin MRN: [id number]; DOB: Jan 10, 1951  Admit date: 07/23/2024 Date of Consult: 07/23/2024  PCP:  Milana Sharper, MD   [facility] Providers Cardiologist:  Dorn Lesches, MD    Patient Profile: Sergio Griffin is a 73 y.o. male with a hx of htn, hld, CAD with prior PCI, prior tobacco use, and sleep apnea who is being seen 07/23/2024 for the evaluation of chest pain at the request of Dr. Dionisio and Dr. Emil.  History of Present Illness: Mr. Boylen developed chest pain earlier this morning around 0830 while he was at rest and looking at his day planner. The chest pain persisted throughout the day and increased in severity with exertion. He also noted shortness of breath and diaphoresis while exerting himself. He was given a nitroglycerin  upon arrival which he states helped improve his pain.  He denies any fever, cough, or abdominal pain. He does report that he has had bloody stools for the past 4-5 days. He is taking aspirin  and prasugrel .   In the ED, BP has been elevated 150/70, HR 80s, satting 91-95% on RA. Initial lactate 3.3 and WBC 24. CTA chest/abd with no acute process.   Past Medical History:  Diagnosis Date   Anxiety    Barrett's esophagus    CAD (coronary artery disease)    PCI, stent 2011   Cardiac arrhythmia    Chicken pox    Depression    Diabetes mellitus without complication (HCC)    Diverticulitis    Diverticulosis    Gastric ulcer    GERD (gastroesophageal reflux disease)    Glaucoma    Heart attack (HCC) 01/29/2019   History of myocardial infarction    Hyperlipidemia    Hypertension    Kidney stones    Kidney stones    Pneumonia 11/2014   Prostatitis    Reflux    Sleep apnea    UTI (lower urinary tract infection)     Past Surgical History:  Procedure Laterality Date   cardiac stents     last one 01/29/2019   CYSTOSCOPY/URETEROSCOPY/HOLMIUM LASER/STENT PLACEMENT Left 10/22/2019    Procedure: CYSTOSCOPY/URETEROSCOPY/HOLMIUM LASER/STENT PLACEMENT;  Surgeon: Cam Morene ORN, MD;  Location: [facility];  Service: Urology;  Laterality: Left;   ESOPHAGEAL MANOMETRY N/A 09/11/2017   Procedure: ESOPHAGEAL MANOMETRY (EM);  Surgeon: Shila Gustav GAILS, MD;  Location: [facility];  Service: Endoscopy;  Laterality: N/A;   EYE SURGERY Left    x 4   EYE SURGERY Right    x 3     Home Medications:  Prior to Admission medications   Medication Sig Start Date End Date Taking? Authorizing Provider  acetaminophen  (TYLENOL ) 500 MG tablet Take 500-1,000 mg by mouth every 6 (six) hours as needed for moderate pain (pain score 4-6) or headache.    [provider]  amLODipine  (NORVASC ) 5 MG tablet Take 5 mg by mouth daily. 10/18/20   [provider]  aspirin  EC 81 MG tablet Take 1 tablet (81 mg total) by mouth at bedtime. 04/03/24   Ricky Fines, MD  Carboxymethylcellul-Glycerin (LUBRICATING EYE DROPS OP) Place 1 drop into both eyes daily as needed (dry eyes).    [provider]  JANUVIA 100 MG tablet  10/28/23   [provider]  ketoconazole (NIZORAL) 2 % shampoo Apply topically. 10/15/20   [provider]  latanoprost  (XALATAN ) 0.005 % ophthalmic solution Place 1 drop into both eyes at bedtime. 05/03/15   [provider]  losartan  (COZAAR ) 100 MG tablet Take 1 tablet by mouth daily. 02/19/24   [provider]  LUTEIN PO Take by mouth.    [provider]  metoprolol  succinate (TOPROL -XL) 25 MG 24 hr tablet Take 12.5-25 mg by mouth daily.    [provider]  miconazole  (MICONAZOLE  ANTIFUNGAL) 2 % cream Apply 1 application topically 2 (two) times daily. For 4-6 weeks 11/22/21   Nandigam, Kavitha V, MD  mupirocin ointment (BACTROBAN) 2 % Apply topically. 10/25/23   [provider]  nitroGLYCERIN  (NITROSTAT ) 0.4 MG SL tablet Place 1 tablet (0.4 mg total) under the tongue every 5 (five) minutes x 3 doses as needed  for chest pain. 06/30/15   Love, Sharlet RAMAN, PA-C  Omega-3 Fatty Acids (FISH OIL) 1000 MG CAPS Take 1,000 mg by mouth daily.    [provider]  ondansetron  (ZOFRAN -ODT) 4 MG disintegrating tablet DISSOLVE 1 TABLET IN MOUTH EVERY 8 HOURS AS NEEDED FOR NAUSEA FOR VOMITING 01/09/22   Nandigam, Kavitha V, MD  prasugrel  (EFFIENT ) 10 MG TABS tablet Take 1 tablet (10 mg total) by mouth at bedtime. Resume 48 hours after surgery 04/03/24   Ricky Fines, MD  torsemide (DEMADEX) 20 MG tablet Take 20 mg by mouth 2 (two) times daily.    [provider]  VITAMIN A PO Take by mouth.    [provider]  Vitamin D, Ergocalciferol, (DRISDOL) 1.25 MG (50000 UNIT) CAPS capsule Take 50,000 Units by mouth 2 (two) times a week. Monday and Thursday 12/25/22   [provider]    Scheduled Meds:  Continuous Infusions:  PRN Meds: nitroGLYCERIN   Allergies:    Allergies  Allergen Reactions   Tamsulosin  Other (See Comments)    Light headed and pass out   Ciprofloxacin  Itching   Ibuprofen Diarrhea and Nausea Only   Propoxyphene Itching    Darvocet   Sulfa Antibiotics Itching    Social History:   Social History   Socioeconomic History   Marital status: Divorced    Spouse name: Not on file   Number of children: 2   Years of education: 16   Highest education level: Not on file  Occupational History   Occupation: Academic librarian    Comment: Retired  Tobacco Use   Smoking status: Former    Types: Cigarettes   Smokeless tobacco: Never   Tobacco comments:    quit 1987  Vaping Use   Vaping status: Never Used  Substance and Sexual Activity   Alcohol  use: No   Drug use: No   Sexual activity: Not Currently    Partners: Female  Other Topics Concern   Not on file  Social History Narrative   Fun: Paint, cook, travel   Denies religious beliefs effecting health care.    Social Drivers of Corporate investment banker Strain: Not on file  Food Insecurity: No Food  Insecurity (03/30/2024)   Hunger Vital Sign    Worried About Running Out of Food in the Last Year: Never true    Ran Out of Food in the Last Year: Never true  Transportation Needs: No Transportation Needs (03/30/2024)   PRAPARE - Administrator, Civil Service (Medical): No    Lack of Transportation (Non-Medical): No  Physical Activity: Not on file  Stress: Not on file  Social Connections: Moderately Integrated (03/30/2024)   Social Connection and Isolation Panel    Frequency of Communication with Friends and Family: More than three times a week  Frequency of Social Gatherings with Friends and Family: More than three times a week    Attends Religious Services: More than 4 times per year    Active Member of [employer] or Organizations: Yes    Attends Engineer, structural: More than 4 times per year    Marital Status: Divorced  Catering manager Violence: Not At Risk (03/30/2024)   Humiliation, Afraid, Rape, and Kick questionnaire    Fear of Current or Ex-Partner: No    Emotionally Abused: No    Physically Abused: No    Sexually Abused: No    Family History:   Family History  Problem Relation Age of Onset   Arthritis Mother    Hyperlipidemia Mother    Stroke Mother    Hypertension Mother    Ovarian cancer Mother    Hyperlipidemia Father    Heart disease Father    Hypertension Father    Non-Hodgkin's lymphoma Father    Congestive Heart Failure Brother    Hyperlipidemia Maternal Grandmother    Heart disease Maternal Grandmother    Stroke Maternal Grandmother    Hypertension Maternal Grandmother    Diabetes Maternal Grandmother    Hyperlipidemia Maternal Grandfather    Heart disease Maternal Grandfather    Hypertension Maternal Grandfather    Heart disease Paternal Grandmother    Heart disease Paternal Grandfather    Stroke Paternal Grandfather    Hypertension Paternal Grandfather    Colon cancer Neg Hx    Esophageal cancer Neg Hx    Pancreatic cancer Neg Hx     Stomach cancer Neg Hx    Liver disease Neg Hx    Colon polyps Neg Hx      ROS:  Please see the history of present illness.  All other ROS reviewed and negative.     Physical Exam/Data: Vitals:   07/23/24 1700 07/23/24 1815 07/23/24 1816 07/23/24 1945  BP: (!) 159/147 (!) 125/53 (!) 125/53 (!) 150/65  Pulse: 84 69 70 67  Resp: (!) 25  18 (!) 22  Temp:      SpO2: 94% 94% 92% 95%  Weight:      Height:       No intake or output data in the 24 hours ending 07/23/24 2104    07/23/2024    4:15 PM 06/10/2024   10:46 AM 05/19/2024    1:37 PM  Last 3 Weights  Weight (lbs) 296 lb 296 lb 6 oz 302 lb  Weight (kg) 134.265 kg 134.435 kg 136.986 kg     Body mass index is 41.28 kg/m.  General:  Well nourished, well developed, in no acute distress HEENT: normal Neck: no JVD Vascular: No carotid bruits; Distal pulses 2+ bilaterally Cardiac:  normal S1, S2; regular rate, irregular rhythm due to sinus arrhythmia; soft systolic murmur Lungs:  clear to auscultation bilaterally, no wheezing, rhonchi or rales  Abd: soft, nontender, no hepatomegaly  Ext: chronic lymphedema and venous stasis Musculoskeletal:  No deformities Skin: warm and dry  Neuro:  no focal abnormalities noted Psych:  Normal affect   EKG:  The EKG was personally reviewed and demonstrates:  SR, normal intervals, mild IVCD, no STE, nonspecific STT wave changes, q wave III, LAD Telemetry:  Telemetry was personally reviewed and demonstrates:  sinus arrhythmia vs frequent PACs  Relevant CV Studies: TTE 10/28/2023 IMPRESSIONS   1. Left ventricular ejection fraction, by estimation, is 60 to 65%. The  left ventricle has normal function. The left ventricle has no regional  wall  motion abnormalities. There is moderate concentric left ventricular  hypertrophy. Left ventricular  diastolic parameters were normal. The average left ventricular global  longitudinal strain is -16.2 %. The global longitudinal strain is normal.   2.  Right ventricular systolic function is normal. The right ventricular  size is normal.   3. The mitral valve is grossly normal. No evidence of mitral valve  regurgitation. No evidence of mitral stenosis.   4. The aortic valve is grossly normal. There is mild calcification of the  aortic valve. Aortic valve regurgitation is not visualized. Aortic valve  sclerosis is present, with no evidence of aortic valve stenosis.   5. The inferior vena cava is normal in size with greater than 50%  respiratory variability, suggesting right atrial pressure of 3 mmHg.   Laboratory Data: High Sensitivity Troponin:   Recent Labs  Lab 07/23/24 1609 07/23/24 1945  TROPONINIHS 8 7     Chemistry Recent Labs  Lab 07/23/24 1609  NA 139  K 5.4*  CL 106  CO2 21*  GLUCOSE 203*  BUN 39*  CREATININE 1.46*  CALCIUM  9.7  GFRNONAA 50*  ANIONGAP 12    Recent Labs  Lab 07/23/24 1609  PROT 6.3*  ALBUMIN 3.4*  AST 30  ALT 44  ALKPHOS 62  BILITOT 1.0   Lipids No results for input(s): CHOL, TRIG, HDL, LABVLDL, LDLCALC, CHOLHDL in the last 168 hours.  Hematology Recent Labs  Lab 07/22/24 1327 07/23/24 1609  WBC 18.7 cH* 23.4*  RBC 5.52 5.50  HGB 14.7 16.0  HCT 47.0 47.0  MCV 85.1 88.5  MCH  --  26.9  MCHC 31.4 30.4  RDW 15.3 15.4  PLT 312.0 340   Radiology/Studies:  CT Angio Chest/Abd/Pel for Dissection W and/or Wo Contrast Result Date: 07/23/2024 CLINICAL DATA:  Acute aortic syndrome suspected EXAM: CT ANGIOGRAPHY CHEST, ABDOMEN AND PELVIS TECHNIQUE: Noncontrast chest CT was performed. Multidetector CT imaging through the chest, abdomen and pelvis was performed using the standard protocol during bolus administration of intravenous contrast. Multiplanar reconstructed images and MIPs were obtained and reviewed to evaluate the vascular anatomy. RADIATION DOSE REDUCTION: This exam was performed according to the departmental dose-optimization program which includes automated exposure  control, adjustment of the mA and/or kV according to patient size and/or use of iterative reconstruction technique. CONTRAST:  OMNIPAQUE  IOHEXOL  350 MG/ML SOLN COMPARISON:  CTA abdomen and pelvis 03/29/2024. CT of the chest 06/12/2015. FINDINGS: CTA CHEST FINDINGS Cardiovascular: Preferential opacification of the thoracic aorta. No evidence of thoracic aortic aneurysm or dissection. Normal heart size. No pericardial effusion. There are atherosclerotic calcifications of the aorta and coronary arteries. Origin of the great vessels appears within normal limits. Mediastinum/Nodes: No enlarged mediastinal, hilar, or axillary lymph nodes. Thyroid  gland, trachea, and esophagus demonstrate no significant findings. Lungs/Pleura: 6 mm right upper lobe nodule image 8/59 is unchanged from 2016 and favored as benign. The lungs are otherwise clear. There is no pleural effusion or pneumothorax. Musculoskeletal: No chest wall abnormality. No acute osseous abnormality. There stable mild compression deformity of T12. Review of the MIP images confirms the above findings. CTA ABDOMEN AND PELVIS FINDINGS VASCULAR Aorta: Normal caliber aorta without aneurysm, dissection, vasculitis or significant stenosis. There are atherosclerotic calcifications of the aorta. Celiac: Patent without evidence of aneurysm, dissection, vasculitis or significant stenosis. SMA: Patent without evidence of aneurysm, dissection, vasculitis or significant stenosis. Renals: Both renal arteries are patent without evidence of aneurysm, dissection, vasculitis, fibromuscular dysplasia or significant stenosis. IMA: Patent without evidence of  aneurysm, dissection, vasculitis or significant stenosis. Inflow: Patent without evidence of aneurysm, dissection, vasculitis or significant stenosis. Veins: No obvious venous abnormality within the limitations of this arterial phase study. Review of the MIP images confirms the above findings. NON-VASCULAR Hepatobiliary: No  focal liver abnormality is seen. No gallstones, gallbladder wall thickening, or biliary dilatation. Pancreas: Unremarkable. No pancreatic ductal dilatation or surrounding inflammatory changes. Spleen: Normal in size without focal abnormality. Adrenals/Urinary Tract: Solid appearing mass measuring 17 mm in the right upper pole is seen on image 6/138. There are punctate nonobstructing bilateral renal calculi. There is no hydronephrosis. The adrenal glands and bladder are within normal limits. Stomach/Bowel: Stomach is within normal limits. Appendix appears normal. No evidence of bowel wall thickening, distention, or inflammatory changes. There is sigmoid and descending diverticulosis. Lymphatic: No enlarged lymph nodes are seen. Reproductive: Prostate gland is enlarged. Other: No abdominal wall hernia or abnormality. No abdominopelvic ascites. Musculoskeletal: No acute osseous abnormality. Review of the MIP images confirms the above findings. IMPRESSION: 1. No evidence for aortic dissection or aneurysm. 2. No acute localizing process in the chest, abdomen or pelvis. 3. Solid appearing right renal mass measuring 17 mm. Findings are concerning for renal cell carcinoma. Recommend further characterization with dedicated renal MRI. 4. Nonobstructing bilateral renal calculi. 5. Colonic diverticulosis. 6. Aortic atherosclerosis. Aortic Atherosclerosis (ICD10-I70.0). Electronically Signed   By: Greig Pique M.D.   On: 07/23/2024 19:16   DG Chest 2 View Result Date: 07/23/2024 CLINICAL DATA:  Chest pain and shortness of breath. EXAM: CHEST - 2 VIEW COMPARISON:  09/25/2020 FINDINGS: Stable upper normal heart size. Unchanged mediastinal contours. Vascular congestion without pulmonary edema. No focal airspace disease. No pleural fluid or pneumothorax. No acute osseous abnormalities. Mild midthoracic chronic compression deformity. IMPRESSION: Borderline cardiomegaly with vascular congestion. Electronically Signed   By: Andrea Gasman M.D.   On: 07/23/2024 17:11   Assessment and Plan: Angina pectoris  CAD s/p prior PCI  Diastolic dysfunction Hypertension Hyperlipidemia The patient presented with chest pain that started at rest and worsened with exertion. His pain was finally relieved with nitroglycerin . The patient has known CAD with prior PCI (no records, done in [place]), and his symptoms are consistent with a cardiac etiology. Given his high pretest probability with typical chest pain, atherosclerosis seen on CT imaging, and patient not taking a statin, I'd normally recommend going straight to a coronary angiogram since he is already on 2 anti-anginal agents. However, given his GIB (although hgb is stable), in Mr. Whittinghill's case, since he is not having an acute coronary syndrome, we can start a 3rd anti-anginal and start with a CTA coronary to evaluate his coronary artery anatomy. In the meantime, we can hold his prasugrel  to assess AM hgb and discussion with GI need for further intervention or diagnostic testing.  - CTA Coronary tomorrow - Can continue aspirin  for now but would hold prasugrel   - If worsenign GI bleed then hold aspirin  as well - Continue metoprolol  and amlodipine   - Start Isordil 5 mg tid, can consolidate to Imdur if tolerated at discharge  - TTE tomorrow - Lipid panel  - Will further discuss statin re-initiation with the patient   Lactic acidosis, resolved Leukocytosis Unclear etiology. No infectious symptoms. Will defer to primary team need for blood cultures and further evaluation.  GI bleed Patient is on DAPT with prasugrel  and aspirin . Has had GIB for 5 days now. Previously had colonoscopy in July. Continues to have blood in his stool with reports of  melena as well. - Please discuss with GI in AM ability to tolerate DAPT in this patient and need for further diagnostic evaluation - Trend hgb - Hold prasugrel  for now  Risk Assessment/Risk Scores:            For questions or  updates, please contact [facility] Please consult [email or web address] for contact info under    Signed, Jerrell DELENA Orchard, MD  07/23/2024 9:04 PM

## 2024-07-24 ENCOUNTER — Inpatient Hospital Stay (HOSPITAL_COMMUNITY)
Admission: EM | Disposition: A | Discharge status: Home / Self Care | Payer: Self-pay | Source: Home / Self Care | Attending: Internal Medicine

## 2024-07-24 ENCOUNTER — Inpatient Hospital Stay (HOSPITAL_COMMUNITY)

## 2024-07-24 ENCOUNTER — Encounter (HOSPITAL_COMMUNITY)
Admission: EM | Disposition: A | Discharge status: Home / Self Care | Payer: Self-pay | Source: Home / Self Care | Attending: Internal Medicine

## 2024-07-24 DIAGNOSIS — I252 Old myocardial infarction: Secondary | ICD-10-CM | POA: Diagnosis not present

## 2024-07-24 DIAGNOSIS — Z882 Allergy status to sulfonamides status: Secondary | ICD-10-CM | POA: Diagnosis not present

## 2024-07-24 DIAGNOSIS — Z87891 Personal history of nicotine dependence: Secondary | ICD-10-CM | POA: Diagnosis not present

## 2024-07-24 DIAGNOSIS — Z7189 Other specified counseling: Secondary | ICD-10-CM

## 2024-07-24 DIAGNOSIS — Y831 Surgical operation with implant of artificial internal device as the cause of abnormal reaction of the patient, or of later complication, without mention of misadventure at the time of the procedure: Secondary | ICD-10-CM | POA: Diagnosis present

## 2024-07-24 DIAGNOSIS — K5731 Diverticulosis of large intestine without perforation or abscess with bleeding: Secondary | ICD-10-CM | POA: Diagnosis present

## 2024-07-24 DIAGNOSIS — Z6841 Body Mass Index (BMI) 40.0 and over, adult: Secondary | ICD-10-CM | POA: Diagnosis not present

## 2024-07-24 DIAGNOSIS — I25110 Atherosclerotic heart disease of native coronary artery with unstable angina pectoris: Secondary | ICD-10-CM | POA: Diagnosis present

## 2024-07-24 DIAGNOSIS — I2510 Atherosclerotic heart disease of native coronary artery without angina pectoris: Secondary | ICD-10-CM | POA: Diagnosis not present

## 2024-07-24 DIAGNOSIS — N1831 Chronic kidney disease, stage 3a: Secondary | ICD-10-CM | POA: Diagnosis present

## 2024-07-24 DIAGNOSIS — Z79899 Other long term (current) drug therapy: Secondary | ICD-10-CM | POA: Diagnosis not present

## 2024-07-24 DIAGNOSIS — R079 Chest pain, unspecified: Secondary | ICD-10-CM

## 2024-07-24 DIAGNOSIS — T82855A Stenosis of coronary artery stent, initial encounter: Secondary | ICD-10-CM | POA: Diagnosis present

## 2024-07-24 DIAGNOSIS — I129 Hypertensive chronic kidney disease with stage 1 through stage 4 chronic kidney disease, or unspecified chronic kidney disease: Secondary | ICD-10-CM | POA: Diagnosis present

## 2024-07-24 DIAGNOSIS — K625 Hemorrhage of anus and rectum: Secondary | ICD-10-CM | POA: Diagnosis not present

## 2024-07-24 DIAGNOSIS — R072 Precordial pain: Secondary | ICD-10-CM

## 2024-07-24 DIAGNOSIS — Z886 Allergy status to analgesic agent status: Secondary | ICD-10-CM | POA: Diagnosis not present

## 2024-07-24 DIAGNOSIS — Z881 Allergy status to other antibiotic agents status: Secondary | ICD-10-CM | POA: Diagnosis not present

## 2024-07-24 DIAGNOSIS — E872 Acidosis, unspecified: Secondary | ICD-10-CM | POA: Diagnosis present

## 2024-07-24 DIAGNOSIS — N179 Acute kidney failure, unspecified: Secondary | ICD-10-CM | POA: Diagnosis present

## 2024-07-24 DIAGNOSIS — Z885 Allergy status to narcotic agent status: Secondary | ICD-10-CM | POA: Diagnosis not present

## 2024-07-24 DIAGNOSIS — Z833 Family history of diabetes mellitus: Secondary | ICD-10-CM | POA: Diagnosis not present

## 2024-07-24 DIAGNOSIS — I259 Chronic ischemic heart disease, unspecified: Secondary | ICD-10-CM | POA: Diagnosis not present

## 2024-07-24 DIAGNOSIS — I200 Unstable angina: Secondary | ICD-10-CM

## 2024-07-24 DIAGNOSIS — Z515 Encounter for palliative care: Secondary | ICD-10-CM | POA: Diagnosis not present

## 2024-07-24 DIAGNOSIS — Z955 Presence of coronary angioplasty implant and graft: Secondary | ICD-10-CM | POA: Diagnosis not present

## 2024-07-24 DIAGNOSIS — E782 Mixed hyperlipidemia: Secondary | ICD-10-CM | POA: Diagnosis present

## 2024-07-24 DIAGNOSIS — E875 Hyperkalemia: Secondary | ICD-10-CM | POA: Diagnosis present

## 2024-07-24 DIAGNOSIS — E1122 Type 2 diabetes mellitus with diabetic chronic kidney disease: Secondary | ICD-10-CM | POA: Diagnosis present

## 2024-07-24 DIAGNOSIS — Z8249 Family history of ischemic heart disease and other diseases of the circulatory system: Secondary | ICD-10-CM | POA: Diagnosis not present

## 2024-07-24 DIAGNOSIS — I700 Atherosclerosis of aorta: Secondary | ICD-10-CM | POA: Diagnosis present

## 2024-07-24 DIAGNOSIS — Z7984 Long term (current) use of oral hypoglycemic drugs: Secondary | ICD-10-CM | POA: Diagnosis not present

## 2024-07-24 DIAGNOSIS — K219 Gastro-esophageal reflux disease without esophagitis: Secondary | ICD-10-CM | POA: Diagnosis present

## 2024-07-24 HISTORY — PX: CORONARY ANGIOGRAPHY: CATH118303

## 2024-07-24 HISTORY — PX: CORONARY STENT INTERVENTION: CATH118234

## 2024-07-24 LAB — CBC WITH DIFFERENTIAL/PLATELET
Abs Immature Granulocytes: 0.36 K/uL — ABNORMAL HIGH (ref 0.00–0.07)
Basophils Absolute: 0.1 K/uL (ref 0.0–0.1)
Basophils Relative: 0 %
Eosinophils Absolute: 0.2 K/uL (ref 0.0–0.5)
Eosinophils Relative: 1 %
HCT: 45.4 % (ref 39.0–52.0)
Hemoglobin: 14 g/dL (ref 13.0–17.0)
Immature Granulocytes: 2 %
Lymphocytes Relative: 12 %
Lymphs Abs: 2.2 K/uL (ref 0.7–4.0)
MCH: 27.2 pg (ref 26.0–34.0)
MCHC: 30.8 g/dL (ref 30.0–36.0)
MCV: 88.3 fL (ref 80.0–100.0)
Monocytes Absolute: 1.1 K/uL — ABNORMAL HIGH (ref 0.1–1.0)
Monocytes Relative: 6 %
Neutro Abs: 15.1 K/uL — ABNORMAL HIGH (ref 1.7–7.7)
Neutrophils Relative %: 79 %
Platelets: 289 K/uL (ref 150–400)
RBC: 5.14 MIL/uL (ref 4.22–5.81)
RDW: 15.4 % (ref 11.5–15.5)
WBC: 19 K/uL — ABNORMAL HIGH (ref 4.0–10.5)
nRBC: 0 % (ref 0.0–0.2)

## 2024-07-24 LAB — ECHOCARDIOGRAM COMPLETE
AR max vel: 2.14 cm2
AV Area VTI: 2.38 cm2
AV Area mean vel: 2.59 cm2
AV Mean grad: 4 mmHg
AV Peak grad: 8.5 mmHg
Ao pk vel: 1.46 m/s
Area-P 1/2: 4.04 cm2
Height: 71 in
S' Lateral: 2.87 cm
Weight: 4732.8 [oz_av]

## 2024-07-24 LAB — BASIC METABOLIC PANEL WITH GFR
Anion gap: 9 (ref 5–15)
BUN: 26 mg/dL — ABNORMAL HIGH (ref 8–23)
CO2: 25 mmol/L (ref 22–32)
Calcium: 9.2 mg/dL (ref 8.9–10.3)
Chloride: 105 mmol/L (ref 98–111)
Creatinine, Ser: 1.2 mg/dL (ref 0.61–1.24)
GFR, Estimated: >60 mL/min (ref >60)
Glucose, Bld: 127 mg/dL — ABNORMAL HIGH (ref 70–99)
Potassium: 4.8 mmol/L (ref 3.5–5.1)
Sodium: 139 mmol/L (ref 135–145)

## 2024-07-24 LAB — TROPONIN I (HIGH SENSITIVITY): Troponin I (High Sensitivity): 7 ng/L (ref <18)

## 2024-07-24 LAB — POCT ACTIVATED CLOTTING TIME
Activated Clotting Time: 285 s
Activated Clotting Time: 250 s
Activated Clotting Time: 354 s
Activated Clotting Time: 354 s

## 2024-07-24 LAB — I-STAT CG4 LACTIC ACID, ED: Lactic Acid, Venous: 1.4 mmol/L (ref 0.5–1.9)

## 2024-07-24 LAB — TSH: TSH: 2.484 u[IU]/mL (ref 0.350–4.500)

## 2024-07-24 SURGERY — CORONARY ANGIOGRAPHY (CATH LAB)
Anesthesia: LOCAL

## 2024-07-24 SURGERY — LEFT HEART CATH AND CORONARY ANGIOGRAPHY
Anesthesia: LOCAL

## 2024-07-24 MED ORDER — FREE WATER
500.0000 mL | Freq: Once | Status: AC
  Administered 2024-07-24: 500 mL via ORAL

## 2024-07-24 MED ORDER — HEPARIN SODIUM (PORCINE) 1000 UNIT/ML IJ SOLN
INTRAMUSCULAR | Status: DC | PRN
Start: 2024-07-24 — End: 2024-07-24
  Administered 2024-07-24: 9000 [IU] via INTRA_ARTERIAL
  Administered 2024-07-24: 5000 [IU] via INTRA_ARTERIAL
  Administered 2024-07-24: 3000 [IU] via INTRA_ARTERIAL

## 2024-07-24 MED ORDER — SODIUM CHLORIDE 0.9 % IV SOLN
INTRAVENOUS | Status: DC | PRN
  Administered 2024-07-24: 10 mL/h via INTRAVENOUS

## 2024-07-24 MED ORDER — LIDOCAINE HCL (PF) 1 % IJ SOLN
INTRAMUSCULAR | Status: DC | PRN
  Administered 2024-07-24: 2 mL via INTRADERMAL

## 2024-07-24 MED ORDER — NITROGLYCERIN 1 MG/10 ML FOR IR/CATH LAB
INTRA_ARTERIAL | Status: AC
  Filled 2024-07-24: qty 10

## 2024-07-24 MED ORDER — PRASUGREL HCL 10 MG PO TABS
ORAL_TABLET | ORAL | Status: DC | PRN
  Administered 2024-07-24: 10 mg via ORAL

## 2024-07-24 MED ORDER — METOPROLOL SUCCINATE ER 25 MG PO TB24
25.0000 mg | ORAL_TABLET | Freq: Every day | ORAL | Status: DC
  Administered 2024-07-24 – 2024-07-27 (×4): 25 mg via ORAL
  Filled 2024-07-24 (×4): qty 1

## 2024-07-24 MED ORDER — MIDAZOLAM HCL 2 MG/2ML IJ SOLN
INTRAMUSCULAR | Status: DC | PRN
  Administered 2024-07-24 (×2): 1 mg via INTRAVENOUS

## 2024-07-24 MED ORDER — SODIUM CHLORIDE 0.9 % IV SOLN: 250.0000 mL | INTRAVENOUS | Status: AC | PRN

## 2024-07-24 MED ORDER — LABETALOL HCL 5 MG/ML IV SOLN
10.0000 mg | INTRAVENOUS | Status: AC | PRN
Start: 2024-07-24 — End: 2024-07-24

## 2024-07-24 MED ORDER — FENTANYL CITRATE (PF) 100 MCG/2ML IJ SOLN
INTRAMUSCULAR | Status: DC | PRN
  Administered 2024-07-24 (×2): 25 ug via INTRAVENOUS

## 2024-07-24 MED ORDER — IOHEXOL 350 MG/ML SOLN
INTRAVENOUS | Status: DC | PRN
  Administered 2024-07-24: 205 mL

## 2024-07-24 MED ORDER — ACETAMINOPHEN 325 MG PO TABS
650.0000 mg | ORAL_TABLET | Freq: Four times a day (QID) | ORAL | Status: DC | PRN
  Administered 2024-07-25: 650 mg via ORAL
  Filled 2024-07-24 (×2): qty 2

## 2024-07-24 MED ORDER — ONDANSETRON HCL 4 MG PO TABS: 4.0000 mg | ORAL_TABLET | Freq: Four times a day (QID) | ORAL | Status: DC | PRN

## 2024-07-24 MED ORDER — LOSARTAN POTASSIUM 50 MG PO TABS
100.0000 mg | ORAL_TABLET | Freq: Every day | ORAL | Status: DC
  Administered 2024-07-24 – 2024-07-27 (×4): 100 mg via ORAL
  Filled 2024-07-24 (×4): qty 2

## 2024-07-24 MED ORDER — LIDOCAINE HCL (PF) 1 % IJ SOLN
INTRAMUSCULAR | Status: AC
  Filled 2024-07-24: qty 30

## 2024-07-24 MED ORDER — PRASUGREL HCL 10 MG PO TABS
10.0000 mg | ORAL_TABLET | Freq: Every day | ORAL | Status: DC
  Administered 2024-07-25 – 2024-07-27 (×3): 10 mg via ORAL
  Filled 2024-07-24 (×3): qty 1

## 2024-07-24 MED ORDER — ACETAMINOPHEN 325 MG PO TABS
650.0000 mg | ORAL_TABLET | ORAL | Status: DC | PRN
  Administered 2024-07-24 – 2024-07-25 (×2): 650 mg via ORAL
  Filled 2024-07-24: qty 2

## 2024-07-24 MED ORDER — SODIUM CHLORIDE 0.9% FLUSH: 3.0000 mL | INTRAVENOUS | Status: DC | PRN

## 2024-07-24 MED ORDER — ONDANSETRON HCL 4 MG/2ML IJ SOLN: 4.0000 mg | Freq: Four times a day (QID) | INTRAMUSCULAR | Status: DC | PRN

## 2024-07-24 MED ORDER — LATANOPROST 0.005 % OP SOLN
1.0000 [drp] | Freq: Every day | OPHTHALMIC | Status: DC
  Administered 2024-07-24 – 2024-07-26 (×3): 1 [drp] via OPHTHALMIC
  Filled 2024-07-24 (×2): qty 2.5

## 2024-07-24 MED ORDER — SODIUM CHLORIDE 0.9% FLUSH
3.0000 mL | Freq: Two times a day (BID) | INTRAVENOUS | Status: DC
  Administered 2024-07-24 – 2024-07-27 (×6): 3 mL via INTRAVENOUS

## 2024-07-24 MED ORDER — ASPIRIN 81 MG PO CHEW: 81.0000 mg | CHEWABLE_TABLET | ORAL | Status: DC

## 2024-07-24 MED ORDER — VERAPAMIL HCL 2.5 MG/ML IV SOLN
INTRAVENOUS | Status: DC | PRN
  Administered 2024-07-24: 10 mL via INTRA_ARTERIAL

## 2024-07-24 MED ORDER — HYDROCORTISONE ACETATE 25 MG RE SUPP
25.0000 mg | Freq: Two times a day (BID) | RECTAL | Status: AC
  Administered 2024-07-24 – 2024-07-25 (×3): 25 mg via RECTAL
  Filled 2024-07-24 (×5): qty 1

## 2024-07-24 MED ORDER — FENTANYL CITRATE (PF) 100 MCG/2ML IJ SOLN
INTRAMUSCULAR | Status: AC
  Filled 2024-07-24: qty 2

## 2024-07-24 MED ORDER — MIDAZOLAM HCL 2 MG/2ML IJ SOLN
INTRAMUSCULAR | Status: AC
  Filled 2024-07-24: qty 2

## 2024-07-24 MED ORDER — ASPIRIN 81 MG PO CHEW
81.0000 mg | CHEWABLE_TABLET | Freq: Every day | ORAL | Status: DC
  Administered 2024-07-25 – 2024-07-27 (×3): 81 mg via ORAL
  Filled 2024-07-24 (×3): qty 1

## 2024-07-24 MED ORDER — HEPARIN (PORCINE) IN NACL 1000-0.9 UT/500ML-% IV SOLN
INTRAVENOUS | Status: DC | PRN
  Administered 2024-07-24 (×3): 500 mL

## 2024-07-24 MED ORDER — ASPIRIN 81 MG PO TBEC
81.0000 mg | DELAYED_RELEASE_TABLET | Freq: Every day | ORAL | Status: DC
Start: 2024-07-24 — End: 2024-07-24

## 2024-07-24 MED ORDER — VERAPAMIL HCL 2.5 MG/ML IV SOLN
INTRAVENOUS | Status: AC
  Filled 2024-07-24: qty 2

## 2024-07-24 MED ORDER — ACETAMINOPHEN 650 MG RE SUPP: 650.0000 mg | Freq: Four times a day (QID) | RECTAL | Status: DC | PRN

## 2024-07-24 MED ORDER — CYCLOSPORINE 0.05 % OP EMUL
1.0000 [drp] | Freq: Two times a day (BID) | OPHTHALMIC | Status: DC
  Administered 2024-07-24 – 2024-07-27 (×6): 1 [drp] via OPHTHALMIC
  Filled 2024-07-24 (×9): qty 30

## 2024-07-24 MED ORDER — HEPARIN SODIUM (PORCINE) 1000 UNIT/ML IJ SOLN
INTRAMUSCULAR | Status: AC
  Filled 2024-07-24: qty 10

## 2024-07-24 MED ORDER — HYDRALAZINE HCL 20 MG/ML IJ SOLN: 10.0000 mg | INTRAMUSCULAR | Status: AC | PRN

## 2024-07-24 MED ORDER — MORPHINE SULFATE (PF) 2 MG/ML IV SOLN
1.0000 mg | Freq: Once | INTRAVENOUS | Status: AC
  Administered 2024-07-24: 1 mg via INTRAVENOUS
  Filled 2024-07-24: qty 1

## 2024-07-24 MED ORDER — MELATONIN 3 MG PO TABS
3.0000 mg | ORAL_TABLET | Freq: Every evening | ORAL | Status: DC | PRN
  Administered 2024-07-24: 3 mg via ORAL
  Filled 2024-07-24: qty 1

## 2024-07-24 SURGICAL SUPPLY — 22 items
BALLOON EMERGE MR 2.5X12 (BALLOONS) IMPLANT
BALLOON SPRINTER MX2 OTW 2.0X6 (BALLOONS) IMPLANT
BALLOON ~~LOC~~ EMERGE MR 3.25X12 (BALLOONS) IMPLANT
CATH GUIDELINER COAST (CATHETERS) IMPLANT
CATH INFINITI 5FR ANG PIGTAIL (CATHETERS) IMPLANT
CATH INFINITI AMBI 6FR TG (CATHETERS) IMPLANT
CATH LAUNCHER 6FR AL.75 (CATHETERS) IMPLANT
CATH LAUNCHER 6FR EBU3.5 (CATHETERS) IMPLANT
CATH VISTA GUIDE 6FR JR4 ECOPK (CATHETERS) IMPLANT
DEVICE RAD COMP TR BAND LRG (VASCULAR PRODUCTS) IMPLANT
ELECT DEFIB PAD ADLT CADENCE (PAD) IMPLANT
GLIDESHEATH SLEND SS 6F .021 (SHEATH) IMPLANT
GUIDEWIRE VAS SION BLUE 190 (WIRE) IMPLANT
KIT ENCORE 26 ADVANTAGE (KITS) IMPLANT
KIT HEMO VALVE WATCHDOG (MISCELLANEOUS) IMPLANT
PACK CARDIAC CATHETERIZATION (CUSTOM PROCEDURE TRAY) ×1 IMPLANT
SET ATX-X65L (MISCELLANEOUS) IMPLANT
SHEATH 6FR 85 DEST SLENDER (SHEATH) IMPLANT
STENT ONYX FRONTIER 3.0X15 (Permanent Stent) IMPLANT
WIRE ASAHI PROWATER 300CM (WIRE) IMPLANT
WIRE CHOICE GRAPHX PT 300 (WIRE) IMPLANT
WIRE EMERALD 3MM-J .035X260CM (WIRE) IMPLANT

## 2024-07-24 NOTE — Progress Notes (Signed)
 Rounding Note   Patient Name: Sergio Griffin Date of Encounter: 07/24/2024  [facility] Cardiologist: Dorn Lesches, MD   Subjective Feeling well.  CP with ambulation.  He reports intermittent episodes of BRBPR over the last several years.  The longest lasts up to 5 days.  It starts with bright red blood and then transitions to melena.  Reports having a colonoscopy 05/2024 that revealed 2 polyps that were successfully removed.  He has been consistently on aspirin  and prasugrel  for several years and hemoglobin has remained stable.  Remote upper endoscopy was unremarkable.  Last PCI was around 2021.  Scheduled Meds:  aspirin  EC  81 mg Oral QHS   cycloSPORINE   1 drop Both Eyes BID   latanoprost   1 drop Both Eyes QHS   metoprolol  succinate  25 mg Oral Daily   Continuous Infusions:  PRN Meds: acetaminophen  **OR** acetaminophen , melatonin, nitroGLYCERIN , ondansetron  **OR** ondansetron  (ZOFRAN ) IV   Vital Signs  Vitals:   07/24/24 0145 07/24/24 0159 07/24/24 0340 07/24/24 0814  BP:   (!) 156/75 (!) 163/82  Pulse: 64  (!) 58   Resp:    20  Temp:  97.7 F (36.5 C) 97.6 F (36.4 C) 97.6 F (36.4 C)  TempSrc:   Oral Oral  SpO2: 96%  95% 95%  Weight:   134.2 kg   Height:   5' 11 (1.803 m)     Intake/Output Summary (Last 24 hours) at 07/24/2024 0901 Last data filed at 07/24/2024 9375 Gross per 24 hour  Intake 0 ml  Output 950 ml  Net -950 ml      07/24/2024    3:40 AM 07/23/2024    4:15 PM 06/10/2024   10:46 AM  Last 3 Weights  Weight (lbs) 295 lb 12.8 oz 296 lb 296 lb 6 oz  Weight (kg) 134.174 kg 134.265 kg 134.435 kg      Telemetry Sinus rhythm/sinus bradycardia.  PACs.- Personally Reviewed  ECG  Sinus rhythm.  Rate 79 bpm.   - Personally Reviewed  Physical Exam  VS:  BP (!) 163/82 (BP Location: Left Arm)   Pulse (!) 58   Temp 97.6 F (36.4 C) (Oral)   Resp 20   Ht 5' 11 (1.803 m)   Wt 134.2 kg   SpO2 95%   BMI 41.26 kg/m  , BMI Body mass index is  41.26 kg/m. GENERAL:  Well appearing HEENT: Pupils equal round and reactive, fundi not visualized, oral mucosa unremarkable NECK:  No jugular venous distention, waveform within normal limits, carotid upstroke brisk and symmetric, no bruits, no thyromegaly LUNGS:  Clear to auscultation bilaterally HEART:  RRR.  PMI not displaced or sustained,S1 and S2 within normal limits, no S3, no S4, no clicks, no rubs, no murmurs ABD:  Flat, positive bowel sounds normal in frequency in pitch, no bruits, no rebound, no guarding, no midline pulsatile mass, no hepatomegaly, no splenomegaly EXT:  2 plus pulses throughout, no edema, no cyanosis no clubbing SKIN:  No rashes no nodules NEURO:  Cranial nerves II through XII grossly intact, motor grossly intact throughout PSYCH:  Cognitively intact, oriented to person place and time  Labs High Sensitivity Troponin:   Recent Labs  Lab 07/23/24 1609 07/23/24 1945 07/24/24 0001  TROPONINIHS 8 7 7      Chemistry Recent Labs  Lab 07/23/24 1609  NA 139  K 5.4*  CL 106  CO2 21*  GLUCOSE 203*  BUN 39*  CREATININE 1.46*  CALCIUM  9.7  PROT 6.3*  ALBUMIN 3.4*  AST 30  ALT 44  ALKPHOS 62  BILITOT 1.0  GFRNONAA 50*  ANIONGAP 12    Lipids No results for input(s): CHOL, TRIG, HDL, LABVLDL, LDLCALC, CHOLHDL in the last 168 hours.  Hematology Recent Labs  Lab 07/22/24 1327 07/23/24 1609  WBC 18.7 cH* 23.4*  RBC 5.52 5.50  HGB 14.7 16.0  HCT 47.0 47.0  MCV 85.1 88.5  MCH  --  26.9  MCHC 31.4 30.4  RDW 15.3 15.4  PLT 312.0 340   Thyroid   Recent Labs  Lab 07/24/24 0636  TSH 2.484    BNPNo results for input(s): BNP, PROBNP in the last 168 hours.  DDimer No results for input(s): DDIMER in the last 168 hours.   Radiology  CT Angio Chest/Abd/Pel for Dissection W and/or Wo Contrast Result Date: 07/23/2024 CLINICAL DATA:  Acute aortic syndrome suspected EXAM: CT ANGIOGRAPHY CHEST, ABDOMEN AND PELVIS TECHNIQUE: Noncontrast chest  CT was performed. Multidetector CT imaging through the chest, abdomen and pelvis was performed using the standard protocol during bolus administration of intravenous contrast. Multiplanar reconstructed images and MIPs were obtained and reviewed to evaluate the vascular anatomy. RADIATION DOSE REDUCTION: This exam was performed according to the departmental dose-optimization program which includes automated exposure control, adjustment of the mA and/or kV according to patient size and/or use of iterative reconstruction technique. CONTRAST:  OMNIPAQUE  IOHEXOL  350 MG/ML SOLN COMPARISON:  CTA abdomen and pelvis 03/29/2024. CT of the chest 06/12/2015. FINDINGS: CTA CHEST FINDINGS Cardiovascular: Preferential opacification of the thoracic aorta. No evidence of thoracic aortic aneurysm or dissection. Normal heart size. No pericardial effusion. There are atherosclerotic calcifications of the aorta and coronary arteries. Origin of the great vessels appears within normal limits. Mediastinum/Nodes: No enlarged mediastinal, hilar, or axillary lymph nodes. Thyroid  gland, trachea, and esophagus demonstrate no significant findings. Lungs/Pleura: 6 mm right upper lobe nodule image 8/59 is unchanged from 2016 and favored as benign. The lungs are otherwise clear. There is no pleural effusion or pneumothorax. Musculoskeletal: No chest wall abnormality. No acute osseous abnormality. There stable mild compression deformity of T12. Review of the MIP images confirms the above findings. CTA ABDOMEN AND PELVIS FINDINGS VASCULAR Aorta: Normal caliber aorta without aneurysm, dissection, vasculitis or significant stenosis. There are atherosclerotic calcifications of the aorta. Celiac: Patent without evidence of aneurysm, dissection, vasculitis or significant stenosis. SMA: Patent without evidence of aneurysm, dissection, vasculitis or significant stenosis. Renals: Both renal arteries are patent without evidence of aneurysm, dissection,  vasculitis, fibromuscular dysplasia or significant stenosis. IMA: Patent without evidence of aneurysm, dissection, vasculitis or significant stenosis. Inflow: Patent without evidence of aneurysm, dissection, vasculitis or significant stenosis. Veins: No obvious venous abnormality within the limitations of this arterial phase study. Review of the MIP images confirms the above findings. NON-VASCULAR Hepatobiliary: No focal liver abnormality is seen. No gallstones, gallbladder wall thickening, or biliary dilatation. Pancreas: Unremarkable. No pancreatic ductal dilatation or surrounding inflammatory changes. Spleen: Normal in size without focal abnormality. Adrenals/Urinary Tract: Solid appearing mass measuring 17 mm in the right upper pole is seen on image 6/138. There are punctate nonobstructing bilateral renal calculi. There is no hydronephrosis. The adrenal glands and bladder are within normal limits. Stomach/Bowel: Stomach is within normal limits. Appendix appears normal. No evidence of bowel wall thickening, distention, or inflammatory changes. There is sigmoid and descending diverticulosis. Lymphatic: No enlarged lymph nodes are seen. Reproductive: Prostate gland is enlarged. Other: No abdominal wall hernia or abnormality. No abdominopelvic ascites. Musculoskeletal: No acute osseous abnormality.  Review of the MIP images confirms the above findings. IMPRESSION: 1. No evidence for aortic dissection or aneurysm. 2. No acute localizing process in the chest, abdomen or pelvis. 3. Solid appearing right renal mass measuring 17 mm. Findings are concerning for renal cell carcinoma. Recommend further characterization with dedicated renal MRI. 4. Nonobstructing bilateral renal calculi. 5. Colonic diverticulosis. 6. Aortic atherosclerosis. Aortic Atherosclerosis (ICD10-I70.0). Electronically Signed   By: Greig Pique M.D.   On: 07/23/2024 19:16   DG Chest 2 View Result Date: 07/23/2024 CLINICAL DATA:  Chest pain and  shortness of breath. EXAM: CHEST - 2 VIEW COMPARISON:  09/25/2020 FINDINGS: Stable upper normal heart size. Unchanged mediastinal contours. Vascular congestion without pulmonary edema. No focal airspace disease. No pleural fluid or pneumothorax. No acute osseous abnormalities. Mild midthoracic chronic compression deformity. IMPRESSION: Borderline cardiomegaly with vascular congestion. Electronically Signed   By: Andrea Gasman M.D.   On: 07/23/2024 17:11    Cardiac Studies Echo 10/2023:  1. Left ventricular ejection fraction, by estimation, is 60 to 65%. The  left ventricle has normal function. The left ventricle has no regional  wall motion abnormalities. There is moderate concentric left ventricular  hypertrophy. Left ventricular  diastolic parameters were normal. The average left ventricular global  longitudinal strain is -16.2 %. The global longitudinal strain is normal.   2. Right ventricular systolic function is normal. The right ventricular  size is normal.   3. The mitral valve is grossly normal. No evidence of mitral valve  regurgitation. No evidence of mitral stenosis.   4. The aortic valve is grossly normal. There is mild calcification of the  aortic valve. Aortic valve regurgitation is not visualized. Aortic valve  sclerosis is present, with no evidence of aortic valve stenosis.   5. The inferior vena cava is normal in size with greater than 50%  respiratory variability, suggesting right atrial pressure of 3 mmHg.   Patient Profile   73 y.o. male with CAD s/p PCI, hypertension, hyperlipidemia, obesity, prior tobacco abuse, and OSA admitted with unstable angina and chronic GIB.  Assessment & Plan   # Unstable angina: # CAD s/p PCI: # Hyperlipidemia:  Remote PCI.  He now presents with classic anginal symptoms.  Ultimately he needs cardiac catheterization.  Cardiac enzymes are negative and EKG is without acute ischemic changes.  Given that he has had some issues with GI  bleeding I think we need to get a sign off from GI before.  He did have a colonoscopy 05/2024 and had polyps removed.  He has had GI bleeding as recently as this week but his hemoglobin is stable.  He has tolerated dual antiplatelet therapy with aspirin  and prasugrel  for years without severe anemia.  If GI is okay, then we will proceed with cardiac catheterization later today.  If they are not, then he will need to undergo endoscopy first and likely cath on Monday.  Continue aspirin .  His prasugrel  was held until we have a decision about whether or not he needs endoscopy.  Continue metoprolol .  LDL was 107 on 05/2024.  He has been on fish oil.  We did discuss why he is not on a statin and revise his lipid-lowering strategy.  Informed Consent   Shared Decision Making/Informed Consent The risks [stroke (1 in 1000), death (1 in 1000), kidney failure [usually temporary] (1 in 500), bleeding (1 in 200), allergic reaction [possibly serious] (1 in 200)], benefits (diagnostic support and management of coronary artery disease) and alternatives of a cardiac  catheterization were discussed in detail with Mr. Bigford and he is willing to proceed.    # GIB:  Recommend GI clearance as above.  Recent colonoscopy 05/2024.  # Hypertension:  His blood pressure is uncontrolled.  Morning metoprolol  is due.  Resume losartan .     For questions or updates, please contact [facility] Please consult [email or web address] for contact info under     Signed, Annabella Scarce, MD  07/24/2024, 9:01 AM

## 2024-07-24 NOTE — Hospital Course (Addendum)
 Sergio Griffin is a 73 y.o. year old male with PMH significant for CAD W/ stents in 2011, 2019 and 2020, type 2 diabetes mellitus, hypertension, hyperlipidemia and recurrent bright red blood per rectum presented to ED due to midsternal chest heaviness, that occurs after activity and also at rest. In the ED, vitals stable afebrile, labs with  K 5.4, BUN/creat 39/1.4 GFR 50- b/l~ 16/1.3, egfr 57-indicating CKD 3a at baseline, troponin negative x 2 .nitroglycerin  has helped the pain.  Leukocytosis 18-previously wbc 15-16k may 2025, initially elevated lactic acid improved on repeat. CT chest abdomen pelvis no acute finding.  EKG sinus rhythm.  Cardiology was consulted and admitted for further management Due to concern for rectal bleeding seen by GI and no further plan for scope at this time, given patient's risk factors underwent cardiac cath > Proximal LAD stent has moderate ISR but the mid LAD stent is chronically occluded. He had a new high-grade PDA lesion was that was treated with 1 drug-eluting stent  Since cardiac cath patient doing well remains chest pain-free. MRI showed finding concerning for RCC for which he will follow-up with urology Continued to have leukocytosis, infectious etiology unremarkable on CT chest abdomen pelvis, chest x-ray, procalcitonin and blood culture negative from 9/6 so far.  Leukocytosis likely from the steroid patient received for his right shoulder recently.  Subjective: Seen and examined Resting comfortably complaints of urinary symptoms-discoloration and foul smell Repeat labs with WBC downtrending to 20k  Discharge diagnosis:  Unstable angina CAD w/ stents Aortic atherosclerosis in CTA: S/p  cardiac cath with-proximal LAD stent has moderate ISR but the mid LAD stent is chronically occluded. He had a new high-grade PDA lesion that was treated with 1 drug-eluting stent.  Per cardiology continue aspirin  81 Effient  10 mg daily Toprol  25 Cozaar  100, increase  activity, at risk of angina given coronary anatomy may need to consider additional long-term nitrate.  Follow-up lipid management in the lipid clinic.  Leukocytosis Unclear etiology,no obvious infection in chest x-ray, CT chest abdomen pelvis.His complaints of dysuria checked UA-appears unremarkable with WBC rate 5 negative nitrite and LE Could be in the setting of dehydration or rectal bleeding versus possible RCC, reactive leukocytosis.he states today he got steroid inj on his right shoulder few days ago-which could be the etiology. Pro-Cal is reassuring .  Blood culture sent 9/6 for completeness-so far NGTD Managed w/ gentle hydration and WBC down in 20k now. He will need follow-up with PCP to recheck his labs. Recent Labs  Lab 07/22/24 1327 07/23/24 1609 07/23/24 1621 07/23/24 1946 07/24/24 0005 07/24/24 0938 07/25/24 0502 07/25/24 0506 07/26/24 1047  WBC 18.7 cH* 23.4*  --   --   --  19.0*  --  19.8* 21.9*  LATICACIDVEN  --   --  3.3* 1.4 1.4  --   --   --   --   PROCALCITON  --   --   --   --   --   --  <0.10  --   --    AKI on CKD 3A Hyperkalemia Metabolic acidosis: b/l~ 16/1.3, egfr 57-CKD 3a at baseline, creatinine stable as below at 1.2 potassium slightly higher side . Recent Labs    03/29/24 2030 03/30/24 0637 03/31/24 0351 07/23/24 1609 07/24/24 0938 07/25/24 0506 07/26/24 1047  BUN 26* 24* 16 39* 26* 23 29*  CREATININE 1.29* 1.39* 1.32* 1.46* 1.20 1.20 1.20  CO2 24 29 27  21* 25 28 28   K 4.5 5.3* 4.5 5.4* 4.8  5.2* 4.5    Chronic diastolic dysfunction: Holding torsemide.  Resume per cardiology  Rectal bleeding intermittently since May-with bright red, painless:?  Diverticular bleed His colonoscopy less than 2 months ago did not reveal clear cause of his recurrent bleeding. GI had seen the patient here and no plan for further scope at this time hemoglobin remains stable.  Bleeding has resolved Recent Labs    07/22/24 1327 07/23/24 1609 07/24/24 0938  07/25/24 0506 07/26/24 1047  HGB 14.7 16.0 14.0 13.3 14.9  MCV 85.1 88.5 88.3 88.3 88.1    Diabetes mellitus type II, non insulin  dependent: Well-controlled continue sliding scale Recent Labs  Lab 07/23/24 1614 07/25/24 1205  GLUCAP 197* 112*    Solid-appearing right renal mass 17 mm-likely RCC based on MRI. I have discussed with Dr. Carolee from urology and needs outpatient follow-up closely. I have informed him of this diagnosis and provided him with Dr. Clifton' office no  Rash on back on upper back and front: Pruritic, ?  From the lotion.he has not been on any medication. No other signs of involvement-no mucosal involvements Cont Hydrocortisone  lotion as needed, advised to follow-up with PCP in few days He wants to go home today-so I advised him to monitor hsi rash and discuss with his PCP  Nonobstructing bilateral renal calculi Colonic diverticulosis: Monitor  Morbid obesity w/ Body mass index is 41.26 kg/m.: Will benefit with PCP follow-up, weight loss,healthy lifestyle and outpatient sleep eval if not done.  DVT prophylaxis: Place and maintain sequential compression device Start: 07/24/24 1324 Code Status:   Code Status: Full Code Family Communication: plan of care discussed with patient at bedside. Patient status is: Remains hospitalized because of severity of illness Level of care: Progressive Cardiac   Dispo: The patient is from: home            Anticipated disposition: Home soon   Objective: Vitals last 24 hrs: Vitals:   07/26/24 1656 07/26/24 1959 07/27/24 0053 07/27/24 0516  BP: (!) 136/58 111/77 (!) 139/59 126/69  Pulse: 75     Resp: 16 16 16 16   Temp: 97.8 F (36.6 C) 98.4 F (36.9 C) 98.1 F (36.7 C) 97.8 F (36.6 C)  TempSrc: Oral Oral Oral Oral  SpO2: 93% 93% 95% 96%  Weight:      Height:        Physical Examination: General exam: AAOX3, pleasant, not in distress HEENT:Oral mucosa moist, Ear/Nose WNL grossly Respiratory system: B/L clear BS,  no use of accessory muscle Cardiovascular system: S1 & S2 +, No JVD. Gastrointestinal system: Abdomen soft,NT,ND, BS+ Nervous System: Alert, awake, moving all extremities,and following commands. Extremities: LE edema present with chronic hyperpigmentation skin changes, distal extremities warm.  Skin:  rashes on upper back and front-erythematous,pruritic, crossing midline. MSK: Normal muscle bulk,tone, power

## 2024-07-24 NOTE — Consult Note (Signed)
 Referring Provider: [facility], Dr. Christobal Primary Care Physician:  Kristine Corean Deed, NP Primary Gastroenterologist:  Dr. Trenna  Reason for Consultation:  GI bleed  HPI: Sergio Griffin is a 73 y.o. male with medical history significant for coronary artery disease and history of cardiac stents in 2011, 2019 and 2020.  He has history of type 2 diabetes mellitus, hypertension, hyperlipidemia.  He presented to [facility] with complaints of chest pain and diaphoresis.  Cardiology evaluated and considering taking him for cardiac cath.  GI called for evaluation of rectal bleeding.  He had messaged/called our office/been in contact earlier this week complaining of rectal bleeding. I checked an outpatient CBC, but advised him to proceed to the emergency department. Hemoglobin was normal, but white count was up. He continued to have rectal bleeding so he agreed to go to [facility] to be evaluated.  Now came to [facility] with complaints of chest pain.  At this point his bleeding has resolved, none for every 36 hours as he says that he he has not seen any since Wednesday afternoon/evening.  Had a bowel movement yesterday at home without blood and had a bowel movement today here without blood.  He showed me photos.  Was mostly red blood on the toilet paper, but some in the bowl as well and he reports feeling like he was passing clots.  Potassium is 5.4, BUN is 39, but creatinine elevated as well.  Lipase is normal.  Lactic acid was 3.3, but normalized with fluid resuscitation.  White blood cell count elevated at 23.4 K.  Looks like this has been persistently elevated for the past 3 months and rising.  Hemoglobin 16.0 g.  Platelets 300 40K.  CTA chest/abdomen/pelvis:  IMPRESSION: 1. No evidence for aortic dissection or aneurysm. 2. No acute localizing process in the chest, abdomen or pelvis. 3. Solid appearing right renal mass measuring 17 mm. Findings are concerning for renal cell carcinoma.  Recommend further characterization with dedicated renal MRI. 4. Nonobstructing bilateral renal calculi. 5. Colonic diverticulosis. 6. Aortic atherosclerosis.   Colonoscopy 05/2024:   - Two 4 to 6 mm polyps in the transverse colon and in the ascending colon, removed with a cold snare. Resected and retrieved. - Severe diverticulosis in the sigmoid colon and in the descending colon. There was narrowing of the colon in association with the diverticular opening. There was evidence of diverticular spasm. Peri- diverticular erythema was seen. There was evidence of an impacted diverticulum. - Non- bleeding external and internal hemorrhoids.  Past Medical History:  Diagnosis Date   Anxiety    Barrett's esophagus    CAD (coronary artery disease)    PCI, stent 2011   Cardiac arrhythmia    Chicken pox    Depression    Diabetes mellitus without complication (HCC)    Diverticulitis    Diverticulosis    Gastric ulcer    GERD (gastroesophageal reflux disease)    Glaucoma    Heart attack (HCC) 01/29/2019   History of myocardial infarction    Hyperlipidemia    Hypertension    Kidney stones    Kidney stones    Pneumonia 11/2014   Prostatitis    Reflux    Sleep apnea    UTI (lower urinary tract infection)     Past Surgical History:  Procedure Laterality Date   cardiac stents     last one 01/29/2019   CYSTOSCOPY/URETEROSCOPY/HOLMIUM LASER/STENT PLACEMENT Left 10/22/2019   Procedure: CYSTOSCOPY/URETEROSCOPY/HOLMIUM LASER/STENT PLACEMENT;  Surgeon: Cam Katz  W, MD;  Location: [facility] ORS;  Service: Urology;  Laterality: Left;   ESOPHAGEAL MANOMETRY N/A 09/11/2017   Procedure: ESOPHAGEAL MANOMETRY (EM);  Surgeon: Shila Gustav GAILS, MD;  Location: [facility] ENDOSCOPY;  Service: Endoscopy;  Laterality: N/A;   EYE SURGERY Left    x 4   EYE SURGERY Right    x 3    Prior to Admission medications   Medication Sig Start Date End Date Taking? Authorizing Provider  acetaminophen  (TYLENOL ) 500 MG  tablet Take 500-1,000 mg by mouth every 6 (six) hours as needed for moderate pain (pain score 4-6) or headache.   Yes [provider]  aspirin  EC 81 MG tablet Take 1 tablet (81 mg total) by mouth at bedtime. 04/03/24  Yes Ricky Fines, MD  Carboxymethylcellul-Glycerin (LUBRICATING EYE DROPS OP) Place 1 drop into both eyes 3 (three) times daily as needed (dry eyes).   Yes [provider]  clotrimazole-betamethasone (LOTRISONE) cream Apply 1 Application topically 2 (two) times daily. 07/14/24  Yes [provider]  cycloSPORINE  (RESTASIS ) 0.05 % ophthalmic emulsion Place 1 drop into both eyes 2 (two) times daily.   Yes [provider]  JANUVIA 100 MG tablet Take 100 mg by mouth every morning. 10/28/23  Yes [provider]  KLAYESTA powder Apply 1 Application topically daily. 07/14/24  Yes [provider]  latanoprost  (XALATAN ) 0.005 % ophthalmic solution Place 1 drop into both eyes at bedtime. 05/03/15  Yes [provider]  losartan  (COZAAR ) 100 MG tablet Take 1 tablet by mouth at bedtime. 02/19/24  Yes [provider]  Lutein 40 MG CAPS Take 40 mg by mouth every morning.   Yes [provider]  metoprolol  succinate (TOPROL -XL) 25 MG 24 hr tablet Take 25 mg by mouth daily. Take one tablet (25mg ) by mouth every morning.   Yes [provider]  nitroGLYCERIN  (NITROSTAT ) 0.4 MG SL tablet Place 1 tablet (0.4 mg total) under the tongue every 5 (five) minutes x 3 doses as needed for chest pain. 06/30/15  Yes Love, Sharlet RAMAN, PA-C  Pediatric Multivit-Minerals-C (CHEWABLES MULTIVITAMIN PO) Take 1 tablet by mouth in the morning. Chew one tablet by mouth daily.  Adult multivitamin   Yes [provider]  prasugrel  (EFFIENT ) 10 MG TABS tablet Take 1 tablet (10 mg total) by mouth at bedtime. Resume 48 hours after surgery 04/03/24  Yes Ricky Fines, MD  torsemide (DEMADEX) 20 MG tablet Take 20 mg by mouth daily as needed (leg  swelling/edema). Take one tablet (20mg ) by mouth daily as needed for leg swelling/fluid build up.  Take an additional tablet the next day if swelling does not decrease.   Yes [provider]  Vitamin A 3 MG CAPS Take 10,000 Units by mouth every morning.   Yes [provider]  Vitamin D, Ergocalciferol, (DRISDOL) 1.25 MG (50000 UNIT) CAPS capsule Take 50,000 Units by mouth 2 (two) times a week. Take one capsule by mouth  on Monday and Thursday.   Yes [provider]    Current Facility-Administered Medications  Medication Dose Route Frequency Provider Last Rate Last Admin   acetaminophen  (TYLENOL ) tablet 650 mg  650 mg Oral Q6H PRN Arthea Child, MD       Or   acetaminophen  (TYLENOL ) suppository 650 mg  650 mg Rectal Q6H PRN Arthea Child, MD       aspirin  EC tablet 81 mg  81 mg Oral QHS Kc, Ramesh, MD       cycloSPORINE  (RESTASIS ) 0.05 %  ophthalmic emulsion 1 drop  1 drop Both Eyes BID Arthea Child, MD       latanoprost  (XALATAN ) 0.005 % ophthalmic solution 1 drop  1 drop Both Eyes QHS Kc, Ramesh, MD       melatonin tablet 3 mg  3 mg Oral QHS PRN Arthea Child, MD       metoprolol  succinate (TOPROL -XL) 24 hr tablet 25 mg  25 mg Oral Daily Claiborne, Claudia, MD       nitroGLYCERIN  (NITROSTAT ) SL tablet 0.4 mg  0.4 mg Sublingual Q5 min PRN Hildegard Loge, PA-C   0.4 mg at 07/23/24 1657   ondansetron  (ZOFRAN ) tablet 4 mg  4 mg Oral Q6H PRN Arthea Child, MD       Or   ondansetron  (ZOFRAN ) injection 4 mg  4 mg Intravenous Q6H PRN Arthea Child, MD        Allergies as of 07/23/2024 - Review Complete 07/23/2024  Allergen Reaction Noted   Nsaids Other (See Comments) 07/23/2024   Tamsulosin  Other (See Comments) 09/24/2020   Fluconazole Other (See Comments) 07/23/2024   Ciprofloxacin  Itching 09/24/2020   Propoxyphene Itching 10/20/2019   Sulfa antibiotics Itching 06/10/2015    Family History  Problem Relation Age of Onset   Arthritis  Mother    Hyperlipidemia Mother    Stroke Mother    Hypertension Mother    Ovarian cancer Mother    Hyperlipidemia Father    Heart disease Father    Hypertension Father    Non-Hodgkin's lymphoma Father    Congestive Heart Failure Brother    Hyperlipidemia Maternal Grandmother    Heart disease Maternal Grandmother    Stroke Maternal Grandmother    Hypertension Maternal Grandmother    Diabetes Maternal Grandmother    Hyperlipidemia Maternal Grandfather    Heart disease Maternal Grandfather    Hypertension Maternal Grandfather    Heart disease Paternal Grandmother    Heart disease Paternal Grandfather    Stroke Paternal Grandfather    Hypertension Paternal Grandfather    Colon cancer Neg Hx    Esophageal cancer Neg Hx    Pancreatic cancer Neg Hx    Stomach cancer Neg Hx    Liver disease Neg Hx    Colon polyps Neg Hx     Social History   Socioeconomic History   Marital status: Divorced    Spouse name: Not on file   Number of children: 2   Years of education: 16   Highest education level: Not on file  Occupational History   Occupation: Academic librarian    Comment: Retired  Tobacco Use   Smoking status: Former    Types: Cigarettes   Smokeless tobacco: Never   Tobacco comments:    quit 1987  Vaping Use   Vaping status: Never Used  Substance and Sexual Activity   Alcohol  use: No   Drug use: No   Sexual activity: Not Currently    Partners: Female  Other Topics Concern   Not on file  Social History Narrative   Fun: Paint, cook, travel   Denies religious beliefs effecting health care.    Social Drivers of Corporate investment banker Strain: Not on file  Food Insecurity: No Food Insecurity (07/24/2024)   Hunger Vital Sign    Worried About Running Out of Food in the Last Year: Never true    Ran Out of Food in the Last Year: Never true  Transportation Needs: No Transportation Needs (07/24/2024)   PRAPARE - Transportation  Lack of Transportation (Medical): No     Lack of Transportation (Non-Medical): No  Physical Activity: Not on file  Stress: Not on file  Social Connections: Moderately Integrated (07/24/2024)   Social Connection and Isolation Panel    Frequency of Communication with Friends and Family: More than three times a week    Frequency of Social Gatherings with Friends and Family: More than three times a week    Attends Religious Services: More than 4 times per year    Active Member of [employer] or Organizations: Yes    Attends Engineer, structural: More than 4 times per year    Marital Status: Divorced  Intimate Partner Violence: Not At Risk (07/24/2024)   Humiliation, Afraid, Rape, and Kick questionnaire    Fear of Current or Ex-Partner: No    Emotionally Abused: No    Physically Abused: No    Sexually Abused: No    Review of Systems: ROS otherwise negative except as mentioned in HPI.  Physical Exam: Vital signs in last 24 hours: Temp:  [97.6 F (36.4 C)-97.7 F (36.5 C)] 97.6 F (36.4 C) (09/05 0814) Pulse Rate:  [58-84] 58 (09/05 0340) Resp:  [14-25] 20 (09/05 0814) BP: (125-179)/(53-147) 163/82 (09/05 0814) SpO2:  [91 %-100 %] 95 % (09/05 0814) Weight:  [134.2 kg-134.3 kg] 134.2 kg (09/05 0340) Last BM Date : 07/24/24 General:  Alert, Well-developed, well-nourished, pleasant and cooperative in NAD Head:  Normocephalic and atraumatic. Eyes:  Sclera clear, no icterus.  Conjunctiva pink. Ears:  Normal auditory acuity. Mouth:  No deformity or lesions.   Lungs:  Clear throughout to auscultation.  No wheezes, crackles, or rhonchi.  Heart:  Regular rate and rhythm; no murmurs, clicks, rubs, or gallops. Abdomen:  Soft, non-distended.  BS present.  Non-tender.    Msk:  Symmetrical without gross deformities. Pulses:  Normal pulses noted. Extremities: Edema with skin changes of chronic stasis noted in bilateral lower extremities. Neurologic:  Alert and oriented x 4;  grossly normal neurologically. Skin:  Intact without  significant lesions or rashes. Psych:  Alert and cooperative. Normal mood and affect.  Intake/Output from previous day: 09/04 0701 - 09/05 0700 In: 0  Out: 950 [Urine:950]  Lab Results: Recent Labs    07/22/24 1327 07/23/24 1609  WBC 18.7 cH* 23.4*  HGB 14.7 16.0  HCT 47.0 47.0  PLT 312.0 340   BMET Recent Labs    07/23/24 1609  NA 139  K 5.4*  CL 106  CO2 21*  GLUCOSE 203*  BUN 39*  CREATININE 1.46*  CALCIUM  9.7   LFT Recent Labs    07/23/24 1609  PROT 6.3*  ALBUMIN 3.4*  AST 30  ALT 44  ALKPHOS 62  BILITOT 1.0   Studies/Results: CT Angio Chest/Abd/Pel for Dissection W and/or Wo Contrast Result Date: 07/23/2024 CLINICAL DATA:  Acute aortic syndrome suspected EXAM: CT ANGIOGRAPHY CHEST, ABDOMEN AND PELVIS TECHNIQUE: Noncontrast chest CT was performed. Multidetector CT imaging through the chest, abdomen and pelvis was performed using the standard protocol during bolus administration of intravenous contrast. Multiplanar reconstructed images and MIPs were obtained and reviewed to evaluate the vascular anatomy. RADIATION DOSE REDUCTION: This exam was performed according to the departmental dose-optimization program which includes automated exposure control, adjustment of the mA and/or kV according to patient size and/or use of iterative reconstruction technique. CONTRAST:  OMNIPAQUE  IOHEXOL  350 MG/ML SOLN COMPARISON:  CTA abdomen and pelvis 03/29/2024. CT of the chest 06/12/2015. FINDINGS: CTA CHEST FINDINGS Cardiovascular: Preferential opacification  of the thoracic aorta. No evidence of thoracic aortic aneurysm or dissection. Normal heart size. No pericardial effusion. There are atherosclerotic calcifications of the aorta and coronary arteries. Origin of the great vessels appears within normal limits. Mediastinum/Nodes: No enlarged mediastinal, hilar, or axillary lymph nodes. Thyroid  gland, trachea, and esophagus demonstrate no significant findings. Lungs/Pleura: 6 mm  right upper lobe nodule image 8/59 is unchanged from 2016 and favored as benign. The lungs are otherwise clear. There is no pleural effusion or pneumothorax. Musculoskeletal: No chest wall abnormality. No acute osseous abnormality. There stable mild compression deformity of T12. Review of the MIP images confirms the above findings. CTA ABDOMEN AND PELVIS FINDINGS VASCULAR Aorta: Normal caliber aorta without aneurysm, dissection, vasculitis or significant stenosis. There are atherosclerotic calcifications of the aorta. Celiac: Patent without evidence of aneurysm, dissection, vasculitis or significant stenosis. SMA: Patent without evidence of aneurysm, dissection, vasculitis or significant stenosis. Renals: Both renal arteries are patent without evidence of aneurysm, dissection, vasculitis, fibromuscular dysplasia or significant stenosis. IMA: Patent without evidence of aneurysm, dissection, vasculitis or significant stenosis. Inflow: Patent without evidence of aneurysm, dissection, vasculitis or significant stenosis. Veins: No obvious venous abnormality within the limitations of this arterial phase study. Review of the MIP images confirms the above findings. NON-VASCULAR Hepatobiliary: No focal liver abnormality is seen. No gallstones, gallbladder wall thickening, or biliary dilatation. Pancreas: Unremarkable. No pancreatic ductal dilatation or surrounding inflammatory changes. Spleen: Normal in size without focal abnormality. Adrenals/Urinary Tract: Solid appearing mass measuring 17 mm in the right upper pole is seen on image 6/138. There are punctate nonobstructing bilateral renal calculi. There is no hydronephrosis. The adrenal glands and bladder are within normal limits. Stomach/Bowel: Stomach is within normal limits. Appendix appears normal. No evidence of bowel wall thickening, distention, or inflammatory changes. There is sigmoid and descending diverticulosis. Lymphatic: No enlarged lymph nodes are seen.  Reproductive: Prostate gland is enlarged. Other: No abdominal wall hernia or abnormality. No abdominopelvic ascites. Musculoskeletal: No acute osseous abnormality. Review of the MIP images confirms the above findings. IMPRESSION: 1. No evidence for aortic dissection or aneurysm. 2. No acute localizing process in the chest, abdomen or pelvis. 3. Solid appearing right renal mass measuring 17 mm. Findings are concerning for renal cell carcinoma. Recommend further characterization with dedicated renal MRI. 4. Nonobstructing bilateral renal calculi. 5. Colonic diverticulosis. 6. Aortic atherosclerosis. Aortic Atherosclerosis (ICD10-I70.0). Electronically Signed   By: Greig Pique M.D.   On: 07/23/2024 19:16   DG Chest 2 View Result Date: 07/23/2024 CLINICAL DATA:  Chest pain and shortness of breath. EXAM: CHEST - 2 VIEW COMPARISON:  09/25/2020 FINDINGS: Stable upper normal heart size. Unchanged mediastinal contours. Vascular congestion without pulmonary edema. No focal airspace disease. No pleural fluid or pneumothorax. No acute osseous abnormalities. Mild midthoracic chronic compression deformity. IMPRESSION: Borderline cardiomegaly with vascular congestion. Electronically Signed   By: Andrea Gasman M.D.   On: 07/23/2024 17:11    IMPRESSION:  Chest pain: Has history of CAD with PCI and stenting.  Cardiology considering repeat cath.  Is on ASA 81 mg and Effient  at home.   2. Rectal bleeding-reports bleeding times for 3 days, but none since Wednesday night/evening.  Had BM yesterday and here this AM without blood.  Hemoglobin is normal.  CTA negative for bleeding source.  Suspect this is diverticular bleed or possibly hemorrhoidal-had both of these on colonoscopy just in July.   3. Leukocytosis-currently 23.4K, but has been elevated and trending up for the past 3 months.  4.  DMT2   5. 17mm renal mass - RCC suspected. Renal MRI recommended.    PLAN: I know him from an outpatient setting. He had  messaged/called our office/been in contact earlier this week complaining of rectal bleeding. I checked an outpatient CBC, but advised him to proceed to the emergency department. Hemoglobin was normal, but white count was up. He continued to have rectal bleeding so he agreed to go to [facility] to be evaluated.  Now came to [facility] with complaints of chest pain.  At this point his bleeding has resolved, none for every 36 hours.  - Monitor hemoglobin. - Will order hydrocortisone  suppositories twice daily to use in case of hemorrhoidal bleeding. - No further procedure or intervention from GI needed at this point.   Harlene BIRCH. Malcome Ambrocio  07/24/2024, 9:12 AM

## 2024-07-24 NOTE — Progress Notes (Addendum)
 Patient c/o 10/10 bounding/splitting headache. Less than a minute later he began complaining of upper midsternal chest pain radiating up through his throat and eyes to the back of his head. He described the pain as crushing. EKG in chart. Patient received 1 nitroglycercin. Systolic Blood pressure  is less than 100. No more nitroglycerin  given at this time. Patient Patient is now on 2L nasal cannula from Patient had a heart catheterization today with intervention. TR Band is still on right radial with 9 cc present. Site is a level 1 with minimum oozing. No hematoma present at this time. Cesario MD notified

## 2024-07-24 NOTE — Interval H&P Note (Signed)
 History and Physical Interval Note:  07/24/2024 2:54 PM  Sergio Griffin  has presented today for surgery, with the diagnosis of unstable angina.  The various methods of treatment have been discussed with the patient and family. After consideration of risks, benefits and other options for treatment, the patient has consented to  Procedure(s): LEFT HEART CATH AND CORONARY ANGIOGRAPHY (N/A) as a surgical intervention.  The patient's history has been reviewed, patient examined, no change in status, stable for surgery.  I have reviewed the patient's chart and labs.  Questions were answered to the patient's satisfaction.     Creed Kail K Laurinda Carreno

## 2024-07-24 NOTE — Progress Notes (Signed)
 PROGRESS NOTE Sergio Griffin  FMW:[id number] DOB: January 01, 1951 DOA: 07/23/2024 PCP: Kristine Corean Deed, NP  Brief Narrative/Hospital Course: Sergio Griffin is a 73 y.o. year old male with PMH significant for CAD W/ stents in 2011, 2019 and 2020, type 2 diabetes mellitus, hypertension, hyperlipidemia and recurrent bright red blood per rectum presented to ED due to midsternal chest heaviness, that occurs after activity and also at rest. In the ED, vitals stable afebrile, labs with  K 5.4, BUN/creat 39/1.4 GFR 50- b/l~ 16/1.3, egfr 57-indicating CKD 3a at baseline, troponin negative x 2 .nitroglycerin  has helped the pain.  Leukocytosis 18-previously wbc 15-16k may 2025, initially elevated lactic acid improved on repeat. CT chest abdomen pelvis no acute finding.  EKG sinus rhythm.  Cardiology was consulted and admitted for further management   Subjective: Seen and examined today Overnight blood pressure stable repeat labs pending. Getting echo this morning no chest pain nausea vomiting.  No rectal bleeding since day before yesterday  Assessment and plan:  Unstable angina CAD w/ stents Aortic atherosclerosis in CTA: Cardiology has been consulted.  Serial troponins negative.  Discussed with cardiology given patient's previous catheter distant history leaning towards possible cardiac cath pending GI clearance GIs been consulted Continue aspirin  for now, holding prasugrel  continue blood pressure management  Leukocytosis Lactic acidosis: Multifactorial lactic acid normal.  No obvious infection chest x-ray, CT chest abdomen pelvis.  Monitor WBC count.  Watch for any signs of infection or fever.  Could be in the setting of dehydration or rectal bleeding Recent Labs  Lab 07/22/24 1327 07/23/24 1609 07/23/24 1621 07/23/24 1946 07/24/24 0005  WBC 18.7 cH* 23.4*  --   --   --   LATICACIDVEN  --   --  3.3* 1.4 1.4    AKI on CKD 3A Hyperkalemia Metabolic acidosis: b/l~ 16/1.3, egfr  57-indicating CKD 3a at baseline, currently elevated.  Repeat labs pending Recent Labs    03/29/24 2030 03/30/24 0637 03/31/24 0351 07/23/24 1609  BUN 26* 24* 16 39*  CREATININE 1.29* 1.39* 1.32* 1.46*  CO2 24 29 27  21*  K 4.5 5.3* 4.5 5.4*    Chronic diastolic dysfunction: Holding torsemide  Rectal bleeding intermittently since May-with bright red, painless: Thjis usually last for about 4 days before it resolves and last episode 2 days ago.   His colonoscopy less than 2 months ago did not reveal clear cause of his recurrent bleeding. The patient was in touch with [facility] gastroenterology 2 days PTA about his bleeding, ?  Hemorrhoidal bleeding Given possible cardiac cath with intervention-GI has been consulted.  Likely diverticular bleeding, hemoglobin is stable Recent Labs    03/31/24 0351 03/31/24 1336 06/10/24 1159 07/22/24 1327 07/23/24 1609  HGB 12.2* 12.7* 14.2 14.7 16.0  MCV 95.2 94.3 86.1 85.1 88.5    Diabetes mellitus type II, non insulin  dependent: Well-controlled continue sliding scale Recent Labs  Lab 07/23/24 1614  GLUCAP 197*     Solid-appearing right renal mass 17 mm- Concerning for RCC.  Patient will need dedicated renal MRI once stable.  Nonobstructing bilateral renal calculi Colonic diverticulosis: Monitor  Morbid obesity w/ Body mass index is 41.26 kg/m.: Will benefit with PCP follow-up, weight loss,healthy lifestyle and outpatient sleep eval if not done.  DVT prophylaxis:  Code Status:   Code Status: Full Code Family Communication: plan of care discussed with patient at bedside. Patient status is: Remains hospitalized because of severity of illness Level of care: Telemetry Cardiac   Dispo: The patient is from:  home            Anticipated disposition: TBD Objective: Vitals last 24 hrs: Vitals:   07/24/24 0145 07/24/24 0159 07/24/24 0340 07/24/24 0814  BP:   (!) 156/75 (!) 163/82  Pulse: 64  (!) 58   Resp:    20  Temp:  97.7 F (36.5  C) 97.6 F (36.4 C) 97.6 F (36.4 C)  TempSrc:   Oral Oral  SpO2: 96%  95% 95%  Weight:   134.2 kg   Height:   5' 11 (1.803 m)     Physical Examination: General exam: alert awake, oriented, older than stated age HEENT:Oral mucosa moist, Ear/Nose WNL grossly Respiratory system: Bilaterally clear BS,no use of accessory muscle Cardiovascular system: S1 & S2 +, No JVD. Gastrointestinal system: Abdomen soft,NT,ND, BS+ Nervous System: Alert, awake, moving all extremities,and following commands. Extremities: LE edema present with chronic hyperpigmentation skin changes, distal extremities warm.  Skin: No rashes,no icterus. MSK: Normal muscle bulk,tone, power   Medications reviewed:  Scheduled Meds:  aspirin  EC  81 mg Oral QHS   cycloSPORINE   1 drop Both Eyes BID   latanoprost   1 drop Both Eyes QHS   losartan   100 mg Oral Daily   metoprolol  succinate  25 mg Oral Daily   Continuous Infusions: Diet: Diet Order             Diet NPO time specified  Diet effective now                   Data Reviewed: I have personally reviewed following labs and imaging studies ( see epic result tab) CBC: Recent Labs  Lab 07/22/24 1327 07/23/24 1609  WBC 18.7 cH* 23.4*  NEUTROABS 15.0* 19.2*  HGB 14.7 16.0  HCT 47.0 47.0  MCV 85.1 88.5  PLT 312.0 340   CMP: Recent Labs  Lab 07/23/24 1609  NA 139  K 5.4*  CL 106  CO2 21*  GLUCOSE 203*  BUN 39*  CREATININE 1.46*  CALCIUM  9.7   GFR: Estimated Creatinine Clearance: 62.1 mL/min (A) (by C-G formula based on SCr of 1.46 mg/dL (H)). Recent Labs  Lab 07/23/24 1609  AST 30  ALT 44  ALKPHOS 62  BILITOT 1.0  PROT 6.3*  ALBUMIN 3.4*    Recent Labs  Lab 07/23/24 1609  LIPASE 42   No results for input(s): AMMONIA in the last 168 hours. Coagulation Profile: No results for input(s): INR, PROTIME in the last 168 hours. Unresulted Labs (From admission, onward)     Start     Ordered   07/24/24 0741  Basic metabolic panel   ONCE - STAT,   STAT       Question:  Specimen collection method  Answer:  Lab=Lab collect   07/24/24 0740   07/24/24 0741  CBC with Differential/Platelet  ONCE - STAT,   STAT       Question:  Specimen collection method  Answer:  Lab=Lab collect   07/24/24 0740           Antimicrobials/Microbiology: Anti-infectives (From admission, onward)    None         Component Value Date/Time   SDES BLOOD RIGHT HAND 09/25/2020 0335   SPECREQUEST  09/25/2020 0335    BOTTLES DRAWN AEROBIC AND ANAEROBIC Blood Culture adequate volume   CULT  09/25/2020 0335    NO GROWTH 6 DAYS Performed at [facility], [address] [place], [place] [address]    REPTSTATUS 10/01/2020 FINAL 09/25/2020  [id number]    Procedures:    Mennie LAMY, MD [facility] 07/24/2024, 9:53 AM

## 2024-07-24 NOTE — Consult Note (Signed)
 Consultation Note Date: 07/24/2024   Patient Name: Sergio Griffin  DOB: 1951-03-02  MRN: [id number]  Age / Sex: 73 y.o., male  PCP: Crumpton, Corean Deed, NP Referring Physician: Christobal Guadalajara, MD  Reason for Consultation: Establishing goals of care  HPI/Patient Profile: 73 y.o. male  with past medical history of  coronary artery disease and history of cardiac stents in 2011, 2019 and 2020.  He has history of type 2 diabetes mellitus, hypertension, hyperlipidemia and recurrent bright red blood per rectum admitted on 07/23/2024 with chest pain.   Patient was admitted with unstable angina for further workup of chest pain and rectal bleeding.  Also has concerning 17 mm renal mass.  PMT has been consulted to assist with advance care planning, anticipatory care needs, medical decision making.  Clinical Assessment and Goals of Care:  I have reviewed medical records including EPIC notes, labs and imaging, assessed the patient and then met at the bedside to discuss diagnosis prognosis, GOC, EOL wishes, disposition and options.  I introduced Palliative Medicine as specialized medical care for people living with serious illness. It focuses on providing relief from the symptoms and stress of a serious illness. The goal is to improve quality of life for both the patient and the family.  We discussed a brief life review of the patient and then focused on their current illness.   I attempted to elicit values and goals of care important to the patient.    Social History: Patient is from [place] .  He has been divorced since 2008, had a very hard time with the fallout for a decade.  He now has a strained relationship with his son and daughter since earlier this year.  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  Recently cared for his mother and brother for several years until they passed recently.  Advance Directives: A detailed discussion regarding advanced  directives was had.  Patient would want to name his friend Elyn and partner Chesley as HCPOA's.  Code Status: Concepts specific to code status, artifical feeding and hydration, and rehospitalization were considered and discussed.   Discussion: Emotional support therapeutic listening was provided as patient shared the changes he has instated in his life since Christmas of last year.  He has changed his diet and started going to the gym after doing nothing for about a year since his mom and brother died.  He reports gaining 80 pounds due to sitting in hospitals and nursing homes with his family as a caregiver.  He now feels the happiest he has ever been since his childhood.  He is trying to move onto from the estranged relationship with his children, who likely have a problem with his relationship with his partner Chesley.  He does not feel they would have his best interest in mind and is open to further discussions of goals of care and advance care planning, naming healthcare power of attorney's.  He is known to his friend Elyn for over 50 years and feels she would make decisions in his best interest.  He plans to move it with his partner later this year.  He is not ready for hospice though open to palliative care involvement and support.   The difference between aggressive medical intervention and comfort care was considered in light of the patient's goals of care.   Discussed the importance of continued conversation with family and the medical providers regarding overall plan of care and treatment options, ensuring decisions are within  the context of the patient's values and GOCs.   Questions and concerns were addressed. PMT will continue to support holistically.    SUMMARY OF RECOMMENDATIONS   - Patient would like to name his friend Elyn and partner Chesley as healthcare power of attorney; spiritual care consulted - Ongoing goals of care discussions - Psychosocial and emotional support provided -  PMT to continue to follow and support   Prognosis:  Unable to determine  Discharge Planning: To Be Determined      Primary Diagnoses: Present on Admission:  Colitis  CAD (coronary artery disease), native coronary artery  Rectal bleeding  Chest pain    Physical Exam Vitals and nursing note reviewed.  Constitutional:      General: He is not in acute distress.    Appearance: He is ill-appearing.  HENT:     Head: Normocephalic and atraumatic.  Cardiovascular:     Rate and Rhythm: Normal rate.  Pulmonary:     Effort: Pulmonary effort is normal.  Skin:    General: Skin is warm and dry.  Neurological:     Mental Status: He is alert and oriented to person, place, and time.  Psychiatric:        Behavior: Behavior normal.        Thought Content: Thought content normal.     Vital Signs: BP (!) 188/78 (BP Location: Left Arm)   Pulse 89   Temp 97.7 F (36.5 C) (Oral)   Resp 20   Ht 5' 11 (1.803 m)   Wt 134.2 kg   SpO2 93%   BMI 41.26 kg/m  Pain Scale: 0-10   Pain Score: 0-No pain   SpO2: SpO2: 93 % O2 Device:SpO2: 93 % O2 Flow Rate: .     Total time: I spent 75 minutes in the care of the patient today in the above activities and documenting the encounter.    Marcha Licklider P Journee Bobrowski, PA-C  Palliative Medicine Team Team phone # [phone]  Thank you for allowing the Palliative Medicine Team to assist in the care of this patient. Please utilize secure chat with additional questions, if there is no response within 30 minutes please call the above phone number.  Palliative Medicine Team providers are available by phone from 7am to 7pm daily and can be reached through the team cell phone.  Should this patient require assistance outside of these hours, please call the patient's attending physician.

## 2024-07-24 NOTE — H&P (View-Only) (Signed)
 Rounding Note   Patient Name: Sergio Griffin Date of Encounter: 07/24/2024  [facility] Cardiologist: Dorn Lesches, MD   Subjective Feeling well.  CP with ambulation.  He reports intermittent episodes of BRBPR over the last several years.  The longest lasts up to 5 days.  It starts with bright red blood and then transitions to melena.  Reports having a colonoscopy 05/2024 that revealed 2 polyps that were successfully removed.  He has been consistently on aspirin  and prasugrel  for several years and hemoglobin has remained stable.  Remote upper endoscopy was unremarkable.  Last PCI was around 2021.  Scheduled Meds:  aspirin  EC  81 mg Oral QHS   cycloSPORINE   1 drop Both Eyes BID   latanoprost   1 drop Both Eyes QHS   metoprolol  succinate  25 mg Oral Daily   Continuous Infusions:  PRN Meds: acetaminophen  **OR** acetaminophen , melatonin, nitroGLYCERIN , ondansetron  **OR** ondansetron  (ZOFRAN ) IV   Vital Signs  Vitals:   07/24/24 0145 07/24/24 0159 07/24/24 0340 07/24/24 0814  BP:   (!) 156/75 (!) 163/82  Pulse: 64  (!) 58   Resp:    20  Temp:  97.7 F (36.5 C) 97.6 F (36.4 C) 97.6 F (36.4 C)  TempSrc:   Oral Oral  SpO2: 96%  95% 95%  Weight:   134.2 kg   Height:   5' 11 (1.803 m)     Intake/Output Summary (Last 24 hours) at 07/24/2024 0901 Last data filed at 07/24/2024 9375 Gross per 24 hour  Intake 0 ml  Output 950 ml  Net -950 ml      07/24/2024    3:40 AM 07/23/2024    4:15 PM 06/10/2024   10:46 AM  Last 3 Weights  Weight (lbs) 295 lb 12.8 oz 296 lb 296 lb 6 oz  Weight (kg) 134.174 kg 134.265 kg 134.435 kg      Telemetry Sinus rhythm/sinus bradycardia.  PACs.- Personally Reviewed  ECG  Sinus rhythm.  Rate 79 bpm.   - Personally Reviewed  Physical Exam  VS:  BP (!) 163/82 (BP Location: Left Arm)   Pulse (!) 58   Temp 97.6 F (36.4 C) (Oral)   Resp 20   Ht 5' 11 (1.803 m)   Wt 134.2 kg   SpO2 95%   BMI 41.26 kg/m  , BMI Body mass index is  41.26 kg/m. GENERAL:  Well appearing HEENT: Pupils equal round and reactive, fundi not visualized, oral mucosa unremarkable NECK:  No jugular venous distention, waveform within normal limits, carotid upstroke brisk and symmetric, no bruits, no thyromegaly LUNGS:  Clear to auscultation bilaterally HEART:  RRR.  PMI not displaced or sustained,S1 and S2 within normal limits, no S3, no S4, no clicks, no rubs, no murmurs ABD:  Flat, positive bowel sounds normal in frequency in pitch, no bruits, no rebound, no guarding, no midline pulsatile mass, no hepatomegaly, no splenomegaly EXT:  2 plus pulses throughout, no edema, no cyanosis no clubbing SKIN:  No rashes no nodules NEURO:  Cranial nerves II through XII grossly intact, motor grossly intact throughout PSYCH:  Cognitively intact, oriented to person place and time  Labs High Sensitivity Troponin:   Recent Labs  Lab 07/23/24 1609 07/23/24 1945 07/24/24 0001  TROPONINIHS 8 7 7      Chemistry Recent Labs  Lab 07/23/24 1609  NA 139  K 5.4*  CL 106  CO2 21*  GLUCOSE 203*  BUN 39*  CREATININE 1.46*  CALCIUM  9.7  PROT 6.3*  ALBUMIN 3.4*  AST 30  ALT 44  ALKPHOS 62  BILITOT 1.0  GFRNONAA 50*  ANIONGAP 12    Lipids No results for input(s): CHOL, TRIG, HDL, LABVLDL, LDLCALC, CHOLHDL in the last 168 hours.  Hematology Recent Labs  Lab 07/22/24 1327 07/23/24 1609  WBC 18.7 cH* 23.4*  RBC 5.52 5.50  HGB 14.7 16.0  HCT 47.0 47.0  MCV 85.1 88.5  MCH  --  26.9  MCHC 31.4 30.4  RDW 15.3 15.4  PLT 312.0 340   Thyroid   Recent Labs  Lab 07/24/24 0636  TSH 2.484    BNPNo results for input(s): BNP, PROBNP in the last 168 hours.  DDimer No results for input(s): DDIMER in the last 168 hours.   Radiology  CT Angio Chest/Abd/Pel for Dissection W and/or Wo Contrast Result Date: 07/23/2024 CLINICAL DATA:  Acute aortic syndrome suspected EXAM: CT ANGIOGRAPHY CHEST, ABDOMEN AND PELVIS TECHNIQUE: Noncontrast chest  CT was performed. Multidetector CT imaging through the chest, abdomen and pelvis was performed using the standard protocol during bolus administration of intravenous contrast. Multiplanar reconstructed images and MIPs were obtained and reviewed to evaluate the vascular anatomy. RADIATION DOSE REDUCTION: This exam was performed according to the departmental dose-optimization program which includes automated exposure control, adjustment of the mA and/or kV according to patient size and/or use of iterative reconstruction technique. CONTRAST:  OMNIPAQUE  IOHEXOL  350 MG/ML SOLN COMPARISON:  CTA abdomen and pelvis 03/29/2024. CT of the chest 06/12/2015. FINDINGS: CTA CHEST FINDINGS Cardiovascular: Preferential opacification of the thoracic aorta. No evidence of thoracic aortic aneurysm or dissection. Normal heart size. No pericardial effusion. There are atherosclerotic calcifications of the aorta and coronary arteries. Origin of the great vessels appears within normal limits. Mediastinum/Nodes: No enlarged mediastinal, hilar, or axillary lymph nodes. Thyroid  gland, trachea, and esophagus demonstrate no significant findings. Lungs/Pleura: 6 mm right upper lobe nodule image 8/59 is unchanged from 2016 and favored as benign. The lungs are otherwise clear. There is no pleural effusion or pneumothorax. Musculoskeletal: No chest wall abnormality. No acute osseous abnormality. There stable mild compression deformity of T12. Review of the MIP images confirms the above findings. CTA ABDOMEN AND PELVIS FINDINGS VASCULAR Aorta: Normal caliber aorta without aneurysm, dissection, vasculitis or significant stenosis. There are atherosclerotic calcifications of the aorta. Celiac: Patent without evidence of aneurysm, dissection, vasculitis or significant stenosis. SMA: Patent without evidence of aneurysm, dissection, vasculitis or significant stenosis. Renals: Both renal arteries are patent without evidence of aneurysm, dissection,  vasculitis, fibromuscular dysplasia or significant stenosis. IMA: Patent without evidence of aneurysm, dissection, vasculitis or significant stenosis. Inflow: Patent without evidence of aneurysm, dissection, vasculitis or significant stenosis. Veins: No obvious venous abnormality within the limitations of this arterial phase study. Review of the MIP images confirms the above findings. NON-VASCULAR Hepatobiliary: No focal liver abnormality is seen. No gallstones, gallbladder wall thickening, or biliary dilatation. Pancreas: Unremarkable. No pancreatic ductal dilatation or surrounding inflammatory changes. Spleen: Normal in size without focal abnormality. Adrenals/Urinary Tract: Solid appearing mass measuring 17 mm in the right upper pole is seen on image 6/138. There are punctate nonobstructing bilateral renal calculi. There is no hydronephrosis. The adrenal glands and bladder are within normal limits. Stomach/Bowel: Stomach is within normal limits. Appendix appears normal. No evidence of bowel wall thickening, distention, or inflammatory changes. There is sigmoid and descending diverticulosis. Lymphatic: No enlarged lymph nodes are seen. Reproductive: Prostate gland is enlarged. Other: No abdominal wall hernia or abnormality. No abdominopelvic ascites. Musculoskeletal: No acute osseous abnormality.  Review of the MIP images confirms the above findings. IMPRESSION: 1. No evidence for aortic dissection or aneurysm. 2. No acute localizing process in the chest, abdomen or pelvis. 3. Solid appearing right renal mass measuring 17 mm. Findings are concerning for renal cell carcinoma. Recommend further characterization with dedicated renal MRI. 4. Nonobstructing bilateral renal calculi. 5. Colonic diverticulosis. 6. Aortic atherosclerosis. Aortic Atherosclerosis (ICD10-I70.0). Electronically Signed   By: Greig Pique M.D.   On: 07/23/2024 19:16   DG Chest 2 View Result Date: 07/23/2024 CLINICAL DATA:  Chest pain and  shortness of breath. EXAM: CHEST - 2 VIEW COMPARISON:  09/25/2020 FINDINGS: Stable upper normal heart size. Unchanged mediastinal contours. Vascular congestion without pulmonary edema. No focal airspace disease. No pleural fluid or pneumothorax. No acute osseous abnormalities. Mild midthoracic chronic compression deformity. IMPRESSION: Borderline cardiomegaly with vascular congestion. Electronically Signed   By: Andrea Gasman M.D.   On: 07/23/2024 17:11    Cardiac Studies Echo 10/2023:  1. Left ventricular ejection fraction, by estimation, is 60 to 65%. The  left ventricle has normal function. The left ventricle has no regional  wall motion abnormalities. There is moderate concentric left ventricular  hypertrophy. Left ventricular  diastolic parameters were normal. The average left ventricular global  longitudinal strain is -16.2 %. The global longitudinal strain is normal.   2. Right ventricular systolic function is normal. The right ventricular  size is normal.   3. The mitral valve is grossly normal. No evidence of mitral valve  regurgitation. No evidence of mitral stenosis.   4. The aortic valve is grossly normal. There is mild calcification of the  aortic valve. Aortic valve regurgitation is not visualized. Aortic valve  sclerosis is present, with no evidence of aortic valve stenosis.   5. The inferior vena cava is normal in size with greater than 50%  respiratory variability, suggesting right atrial pressure of 3 mmHg.   Patient Profile   73 y.o. male with CAD s/p PCI, hypertension, hyperlipidemia, obesity, prior tobacco abuse, and OSA admitted with unstable angina and chronic GIB.  Assessment & Plan   # Unstable angina: # CAD s/p PCI: # Hyperlipidemia:  Remote PCI.  He now presents with classic anginal symptoms.  Ultimately he needs cardiac catheterization.  Cardiac enzymes are negative and EKG is without acute ischemic changes.  Given that he has had some issues with GI  bleeding I think we need to get a sign off from GI before.  He did have a colonoscopy 05/2024 and had polyps removed.  He has had GI bleeding as recently as this week but his hemoglobin is stable.  He has tolerated dual antiplatelet therapy with aspirin  and prasugrel  for years without severe anemia.  If GI is okay, then we will proceed with cardiac catheterization later today.  If they are not, then he will need to undergo endoscopy first and likely cath on Monday.  Continue aspirin .  His prasugrel  was held until we have a decision about whether or not he needs endoscopy.  Continue metoprolol .  LDL was 107 on 05/2024.  He has been on fish oil.  We did discuss why he is not on a statin and revise his lipid-lowering strategy.  Informed Consent   Shared Decision Making/Informed Consent The risks [stroke (1 in 1000), death (1 in 1000), kidney failure [usually temporary] (1 in 500), bleeding (1 in 200), allergic reaction [possibly serious] (1 in 200)], benefits (diagnostic support and management of coronary artery disease) and alternatives of a cardiac  catheterization were discussed in detail with Mr. Bigford and he is willing to proceed.    # GIB:  Recommend GI clearance as above.  Recent colonoscopy 05/2024.  # Hypertension:  His blood pressure is uncontrolled.  Morning metoprolol  is due.  Resume losartan .     For questions or updates, please contact [facility] Please consult [email or web address] for contact info under     Signed, Annabella Scarce, MD  07/24/2024, 9:01 AM

## 2024-07-25 ENCOUNTER — Inpatient Hospital Stay (HOSPITAL_COMMUNITY)

## 2024-07-25 DIAGNOSIS — R079 Chest pain, unspecified: Secondary | ICD-10-CM | POA: Diagnosis not present

## 2024-07-25 DIAGNOSIS — Z955 Presence of coronary angioplasty implant and graft: Secondary | ICD-10-CM

## 2024-07-25 LAB — CBC
HCT: 43.2 % (ref 39.0–52.0)
Hemoglobin: 13.3 g/dL (ref 13.0–17.0)
MCH: 27.2 pg (ref 26.0–34.0)
MCHC: 30.8 g/dL (ref 30.0–36.0)
MCV: 88.3 fL (ref 80.0–100.0)
Platelets: 283 K/uL (ref 150–400)
RBC: 4.89 MIL/uL (ref 4.22–5.81)
RDW: 15.5 % (ref 11.5–15.5)
WBC: 19.8 K/uL — ABNORMAL HIGH (ref 4.0–10.5)
nRBC: 0 % (ref 0.0–0.2)

## 2024-07-25 LAB — GLUCOSE, CAPILLARY: Glucose-Capillary: 112 mg/dL — ABNORMAL HIGH (ref 70–99)

## 2024-07-25 LAB — BASIC METABOLIC PANEL WITH GFR
Anion gap: 10 (ref 5–15)
BUN: 23 mg/dL (ref 8–23)
CO2: 28 mmol/L (ref 22–32)
Calcium: 8.9 mg/dL (ref 8.9–10.3)
Chloride: 102 mmol/L (ref 98–111)
Creatinine, Ser: 1.2 mg/dL (ref 0.61–1.24)
GFR, Estimated: >60 mL/min (ref >60)
Glucose, Bld: 118 mg/dL — ABNORMAL HIGH (ref 70–99)
Potassium: 5.2 mmol/L — ABNORMAL HIGH (ref 3.5–5.1)
Sodium: 140 mmol/L (ref 135–145)

## 2024-07-25 LAB — PROCALCITONIN: Procalcitonin: <0.1 ng/mL

## 2024-07-25 MED ORDER — GADOBUTROL 1 MMOL/ML IV SOLN
10.0000 mL | Freq: Once | INTRAVENOUS | Status: AC | PRN
  Administered 2024-07-25: 10 mL via INTRAVENOUS

## 2024-07-25 NOTE — Plan of Care (Signed)

## 2024-07-25 NOTE — Progress Notes (Addendum)
 Progress Note  Patient Name: Sergio Griffin Date of Encounter: 07/25/2024  Primary Cardiologist: Dorn Lesches, MD  Interval Summary  Chart reviewed including follow-up by Dr. Raford yesterday and cardiac catheterization.  Patient states that he woke up with headache last night followed by chest discomfort, ultimately resolved after Tylenol  and morphine .  No symptoms now.  He has not ambulated as yet.  Vital Signs  Vitals:   07/25/24 0220 07/25/24 0320 07/25/24 0420 07/25/24 0936  BP: 124/61 113/72 112/60 (!) 109/49  Pulse: (!) 52 (!) 39 (!) 56 73  Resp:   16   Temp:   97.6 F (36.4 C)   TempSrc:   Oral   SpO2: 96% 98% 98%   Weight:      Height:        Intake/Output Summary (Last 24 hours) at 07/25/2024 1139 Last data filed at 07/25/2024 0028 Gross per 24 hour  Intake 1017.03 ml  Output 1250 ml  Net -232.97 ml   Filed Weights   07/23/24 1615 07/24/24 0340  Weight: 134.3 kg 134.2 kg    Physical Exam  GEN: No acute distress.   Neck: No JVD. Cardiac: RRR, no murmur, rub, or gallop.  Respiratory: Nonlabored. Clear to auscultation bilaterally. GI: Soft, nontender, bowel sounds present. MS: No edema.  ECG/Telemetry  Telemetry reviewed showing sinus rhythm with frequent PACs.  Labs  Chemistry Recent Labs  Lab 07/23/24 1609 07/24/24 0938 07/25/24 0506  NA 139 139 140  K 5.4* 4.8 5.2*  CL 106 105 102  CO2 21* 25 28  GLUCOSE 203* 127* 118*  BUN 39* 26* 23  CREATININE 1.46* 1.20 1.20  CALCIUM  9.7 9.2 8.9  PROT 6.3*  --   --   ALBUMIN 3.4*  --   --   AST 30  --   --   ALT 44  --   --   ALKPHOS 62  --   --   BILITOT 1.0  --   --   GFRNONAA 50* >60 >60  ANIONGAP 12 9 10     Hematology Recent Labs  Lab 07/23/24 1609 07/24/24 0938 07/25/24 0506  WBC 23.4* 19.0* 19.8*  RBC 5.50 5.14 4.89  HGB 16.0 14.0 13.3  HCT 47.0 45.4 43.2  MCV 88.5 88.3 88.3  MCH 26.9 27.2 27.2  MCHC 30.4 30.8 30.8  RDW 15.4 15.4 15.5  PLT 340 289 283   Cardiac  Enzymes Recent Labs  Lab 07/23/24 1609 07/23/24 1945 07/24/24 0001  TROPONINIHS 8 7 7    Lipid Panel     Component Value Date/Time   CHOL 114 06/11/2015 0247   TRIG 73 06/11/2015 0247   HDL 45 06/11/2015 0247   CHOLHDL 2.5 06/11/2015 0247   VLDL 15 06/11/2015 0247   LDLCALC 54 06/11/2015 0247    Cardiac Studies  Echocardiogram 04/23/2024:  1. Left ventricular ejection fraction, by estimation, is 60 to 65%. The  left ventricle has normal function. The left ventricle has no regional  wall motion abnormalities. There is mild left ventricular hypertrophy.  Left ventricular diastolic parameters  were normal.   2. Right ventricular systolic function is normal. The right ventricular  size is normal.   3. The mitral valve is grossly normal. Trivial mitral valve  regurgitation. No evidence of mitral stenosis.   4. The aortic valve is tricuspid. Aortic valve regurgitation is not  visualized. Aortic valve sclerosis is present, with no evidence of aortic  valve stenosis.   Cardiac catheterization 04/23/2024:   Prox  LAD to Mid LAD lesion is 50% stenosed.   Mid LAD lesion is 100% stenosed.   RPDA lesion is 95% stenosed.   A stent was successfully placed.   Post intervention, there is a 0% residual stenosis.   1.  Torturous right upper extremity requiring 85 cm destination sheath; even with this sheath coronary cannulation was challenging and required an EBU 3 5 guide and GuideLiner for selective coronary angiography of the left system and an AL 0.75 guide for angiography of the right system. 2.  Occluded mid LAD stents with mild in-stent restenosis of proximal LAD stent. 3.  95% PDA lesion treated with 3.0 x 15 mm resolute Onyx frontier stent postdilated to high pressures with a 3.25 Anaconda balloon.  This required complex PCI with a GuideLiner and multiple wires.   Summary: The patient's chronic prasugrel  was reinitiated.  Given the patient's history of bright red blood per rectum which should  seems to have resolved, resolute stent was chosen given its indication for 1 month of DAPT if needed.  Otherwise would recommend 6 to 12 months of dual antiplatelet therapy with aspirin  and prasugrel  followed by prasugrel  monotherapy.  Assessment & Plan  1.  CAD with unstable angina status post cardiac catheterization on June 5 demonstrating occluded mid LAD stent site with mild in-stent restenosis involving the proximal LAD stent and 95% PDA managed with DES.  Right to left collaterals present.  LVEF 60 to 65% by echocardiography.  Reports having some headache and chest discomfort overnight, presently resolved.  Has not ambulated yet.  2.  Mixed hyperlipidemia, LDL 107 in July.  He has been on omega-3 supplements, but not statin therapy.  He recalls trying three separate statins with intolerance/memory issues.  3.  Primary hypertension.  4.  History of hematochezia/melena, followed by GI with colonoscopy in July showing 2 colonic polyps that were removed successfully.  He has tolerated DAPT in this setting and was cleared by GI prior to cardiac catheterization above.  Continue aspirin  81 mg daily, Effient  10 mg daily, Toprol -XL 25 mg daily, and Cozaar  100 mg daily.  Increase activity today.  Still at risk for angina given coronary anatomy, may need to consider addition of long-acting nitrate.  Also discussed lipid management, he is agreeable to further discussion regarding PCSK9 inhibitors at office follow-up.  For questions or updates, please contact [facility] Please consult [email or web address] for contact info under   Signed, Jayson Sierras, MD  07/25/2024, 11:39 AM

## 2024-07-25 NOTE — Progress Notes (Signed)
 Daily Progress Note   Patient Name: Sergio Griffin       Date: 07/25/2024 DOB: [birth year]  Age: 73 y.o. MRN#: [id number] Attending Physician: Christobal Guadalajara, MD Primary Care Physician: Kristine Corean Deed, NP Admit Date: 07/23/2024  Reason for Consultation/Follow-up: Establishing goals of care  Subjective: Medical records reviewed including progress notes, labs and imaging. Patient assessed at the bedside. He reports some headache and difficulty sleeping overnight, doing better now. No visitors were present.  Created space and opportunity for patient's thoughts and feelings on his current illness. He is very hopeful for recovery and return to his previous quality of life with ongoing efforts to improve his health. His understanding is that he may go home tomorrow. We discussed process of completing advance directives in the community with notary if he is no longer hospitalized when notary returns in the hospital. He verbalized his understanding.   Patient would accept at least a trial of CPR and a week or so of aggressive life-prolonging care. He does not want to be written off too soon and is not afraid to die, but has many things he wants to live for. He desires cremation when his time does come.   Questions and concerns addressed. PMT will continue to support holistically.   Length of Stay: 1   Physical Exam Vitals and nursing note reviewed.  Constitutional:      General: He is not in acute distress. HENT:     Head: Normocephalic.  Cardiovascular:     Rate and Rhythm: Normal rate.  Pulmonary:     Effort: Pulmonary effort is normal.  Neurological:     Mental Status: He is alert and oriented to person, place, and time.  Psychiatric:        Mood and Affect: Mood normal.        Behavior: Behavior normal.            Vital Signs:  BP (!) 109/49   Pulse 73   Temp 97.6 F (36.4 C) (Oral)   Resp 16   Ht 5' 11 (1.803 m)   Wt 134.2 kg   SpO2 98%   BMI 41.26 kg/m  SpO2: SpO2: 98 % O2 Device: O2 Device: Nasal Cannula O2 Flow Rate: O2 Flow Rate (L/min): 2 L/min      Palliative Care Assessment & Plan   Patient Profile: 73 y.o. male  with past medical history of  coronary artery disease and history of cardiac stents in 2011, 2019 and 2020.  He has history of type 2 diabetes mellitus, hypertension, hyperlipidemia and recurrent bright red blood per rectum admitted on  07/23/2024 with chest pain.    Patient was admitted with unstable angina for further workup of chest pain and rectal bleeding.  Also has concerning 17 mm renal mass.   PMT has been consulted to assist with advance care planning, anticipatory care needs, medical decision making.  Assessment: Goals of care conversation CAD with unstable angina status post cardiac catheterization s/p stents Lactic acidosis AKI on CKD 3A   Recommendations/Plan: Continue full code/full scope treatment Patient's goal is to return home and continue working on improving his health. He plans to proceed with completion of advance directives in the community if unable to finalize while admitted. GOC documented in [facility] Patient would be agreeable to a 1 week trial of aggressive life-prolonging care Psychosocial and emotional support provided PMT remains available as needed   Prognosis:  Unable to determine  Discharge Planning: Home  Care plan was discussed with patient  MDM: Moderate ACP time: 30 minutes  Rupa Lagan P Demba Nigh, PA-C  Palliative Medicine Team Team phone # [phone]  Thank you for allowing the Palliative Medicine Team to assist in the care of this patient. Please utilize secure chat with additional questions, if there is no response within 30 minutes please call the above phone number.  Palliative Medicine Team providers are available by phone from  7am to 7pm daily and can be reached through the team cell phone.  Should this patient require assistance outside of these hours, please call the patient's attending physician.

## 2024-07-25 NOTE — Progress Notes (Signed)
 PROGRESS NOTE Sergio Griffin  FMW:[id number] DOB: 29-Sep-1951 DOA: 07/23/2024 PCP: Kristine Corean Deed, NP  Brief Narrative/Hospital Course: Sergio Griffin is a 74 y.o. year old male with PMH significant for CAD W/ stents in 2011, 2019 and 2020, type 2 diabetes mellitus, hypertension, hyperlipidemia and recurrent bright red blood per rectum presented to ED due to midsternal chest heaviness, that occurs after activity and also at rest. In the ED, vitals stable afebrile, labs with  K 5.4, BUN/creat 39/1.4 GFR 50- b/l~ 16/1.3, egfr 57-indicating CKD 3a at baseline, troponin negative x 2 .nitroglycerin  has helped the pain.  Leukocytosis 18-previously wbc 15-16k may 2025, initially elevated lactic acid improved on repeat. CT chest abdomen pelvis no acute finding.  EKG sinus rhythm.  Cardiology was consulted and admitted for further management Due to concern for rectal bleeding seen by GI and no further plan for scope at this time, given patient's risk factors underwent cardiac cath > Proximal LAD stent has moderate ISR but the mid LAD stent is chronically occluded. He had a new high-grade PDA lesion was that was treated with 1 drug-eluting stent   Subjective: Seen and examined Some chest pain after agitation, walked w/ PT  Had good BM and no blood today Overnight BP remains stable although some bradycardia, had headache last night and did not sleep well. Labs reviewed potassium on higher side persistent leukocytosis  Assessment and plan:  Unstable angina CAD w/ stents Aortic atherosclerosis in CTA: Underwent cardiac cath with - proximal LAD stent has moderate ISR but the mid LAD stent is chronically occluded. He had a new high-grade PDA lesion that was treated with 1 drug-eluting stent. Appreciate cardiology input continue current aspirin  81, prasugrel , losartan  and metoprolol  monitor heart rate.  Leukocytosis Lactic acidosis: Multifactorial ,lactic acid normal.  No obvious infection  chest x-ray, CT chest abdomen pelvis. Could be in the setting of dehydration or rectal bleeding.  WBC remains elevated check Pro-Cal .  Check UA, blood culture x 2 for completeness Recent Labs  Lab 07/22/24 1327 07/23/24 1609 07/23/24 1621 07/23/24 1946 07/24/24 0005 07/24/24 0938 07/25/24 0506  WBC 18.7 cH* 23.4*  --   --   --  19.0* 19.8*  LATICACIDVEN  --   --  3.3* 1.4 1.4  --   --     AKI on CKD 3A Hyperkalemia Metabolic acidosis: b/l~ 16/1.3, egfr 57-CKD 3a at baseline, creatinine stable as below at 1.2 potassium slightly higher side . Recent Labs    03/29/24 2030 03/30/24 0637 03/31/24 0351 07/23/24 1609 07/24/24 0938 07/25/24 0506  BUN 26* 24* 16 39* 26* 23  CREATININE 1.29* 1.39* 1.32* 1.46* 1.20 1.20  CO2 24 29 27  21* 25 28  K 4.5 5.3* 4.5 5.4* 4.8 5.2*    Chronic diastolic dysfunction: Holding torsemide.  Rectal bleeding intermittently since May-with bright red, painless:?  Diverticular bleed Thjis usually last for about 4 days before it resolves and last episode 2 days ago.   His colonoscopy less than 2 months ago did not reveal clear cause of his recurrent bleeding. The patient was in touch with [facility] gastroenterology 2 days PTA about his bleeding, ?  Hemorrhoidal bleeding GI had seen the patient here and no plan for further scope at this time hemoglobin remains stable Recent Labs    06/10/24 1159 07/22/24 1327 07/23/24 1609 07/24/24 0938 07/25/24 0506  HGB 14.2 14.7 16.0 14.0 13.3  MCV 86.1 85.1 88.5 88.3 88.3    Diabetes mellitus type II, non insulin   dependent: Well-controlled continue sliding scale Recent Labs  Lab 07/23/24 1614  GLUCAP 197*    Solid-appearing right renal mass 17 mm- Concerning for RCC.  Patient will need dedicated renal MRI. Will order today   Nonobstructing bilateral renal calculi Colonic diverticulosis: Monitor  Morbid obesity w/ Body mass index is 41.26 kg/m.: Will benefit with PCP follow-up, weight loss,healthy  lifestyle and outpatient sleep eval if not done.  DVT prophylaxis: Place and maintain sequential compression device Start: 07/24/24 1324 Code Status:   Code Status: Full Code Family Communication: plan of care discussed with patient at bedside. Patient status is: Remains hospitalized because of severity of illness Level of care: Progressive Cardiac   Dispo: The patient is from: home            Anticipated disposition: TBD Objective: Vitals last 24 hrs: Vitals:   07/25/24 0220 07/25/24 0320 07/25/24 0420 07/25/24 0936  BP: 124/61 113/72 112/60 (!) 109/49  Pulse: (!) 52 (!) 39 (!) 56 73  Resp:   16   Temp:   97.6 F (36.4 C)   TempSrc:   Oral   SpO2: 96% 98% 98%   Weight:      Height:        Physical Examination: General exam: AAOX3 HEENT:Oral mucosa moist, Ear/Nose WNL grossly Respiratory system: Bilaterally clear BS,no use of accessory muscle Cardiovascular system: S1 & S2 +, No JVD. Gastrointestinal system: Abdomen soft,NT,ND, BS+ Nervous System: Alert, awake, moving all extremities,and following commands. Extremities: LE edema present with chronic hyperpigmentation skin changes, distal extremities warm.  Skin: No rashes,no icterus. MSK: Normal muscle bulk,tone, power   Medications reviewed:  Scheduled Meds:  aspirin   81 mg Oral Daily   cycloSPORINE   1 drop Both Eyes BID   hydrocortisone   25 mg Rectal BID   latanoprost   1 drop Both Eyes QHS   losartan   100 mg Oral Daily   metoprolol  succinate  25 mg Oral Daily   prasugrel   10 mg Oral Daily   sodium chloride  flush  3 mL Intravenous Q12H   Continuous Infusions:  sodium chloride      Diet: Diet Order             Diet Heart Room service appropriate? Yes; Fluid consistency: Thin  Diet effective now                   Data Reviewed: I have personally reviewed following labs and imaging studies ( see epic result tab) CBC: Recent Labs  Lab 07/22/24 1327 07/23/24 1609 07/24/24 0938 07/25/24 0506  WBC 18.7  cH* 23.4* 19.0* 19.8*  NEUTROABS 15.0* 19.2* 15.1*  --   HGB 14.7 16.0 14.0 13.3  HCT 47.0 47.0 45.4 43.2  MCV 85.1 88.5 88.3 88.3  PLT 312.0 340 289 283   CMP: Recent Labs  Lab 07/23/24 1609 07/24/24 0938 07/25/24 0506  NA 139 139 140  K 5.4* 4.8 5.2*  CL 106 105 102  CO2 21* 25 28  GLUCOSE 203* 127* 118*  BUN 39* 26* 23  CREATININE 1.46* 1.20 1.20  CALCIUM  9.7 9.2 8.9   GFR: Estimated Creatinine Clearance: 75.5 mL/min (by C-G formula based on SCr of 1.2 mg/dL). Recent Labs  Lab 07/23/24 1609  AST 30  ALT 44  ALKPHOS 62  BILITOT 1.0  PROT 6.3*  ALBUMIN 3.4*    Recent Labs  Lab 07/23/24 1609  LIPASE 42   No results for input(s): AMMONIA in the last 168 hours. Coagulation Profile: No results for  input(s): INR, PROTIME in the last 168 hours. Unresulted Labs (From admission, onward)     Start     Ordered   07/25/24 1106  Culture, blood (Routine X 2) w Reflex to ID Panel  BLOOD CULTURE X 2,   R (with TIMED occurrences)      07/25/24 1105   07/25/24 0855  Procalcitonin  Add-on,   AD       References:    Procalcitonin Lower Respiratory Tract Infection AND Sepsis Procalcitonin Algorithm  Question:  Specimen collection method  Answer:  Lab=Lab collect   07/25/24 0854   07/25/24 0500  Lipoprotein A (LPA)  Tomorrow morning,   R       Question:  Specimen collection method  Answer:  Lab=Lab collect   07/24/24 1658           Antimicrobials/Microbiology: Anti-infectives (From admission, onward)    None         Component Value Date/Time   SDES BLOOD RIGHT HAND 09/25/2020 0335   SPECREQUEST  09/25/2020 0335    BOTTLES DRAWN AEROBIC AND ANAEROBIC Blood Culture adequate volume   CULT  09/25/2020 0335    NO GROWTH 6 DAYS Performed at [facility], [address] [place], [place] [address]    REPTSTATUS 10/01/2020 FINAL 09/25/2020 0335    Procedures: Procedure(s) (LRB): CORONARY ANGIOGRAPHY (N/A) CORONARY STENT INTERVENTION (N/A)   Sergio LAMY,  MD [facility] 07/25/2024, 11:06 AM

## 2024-07-25 NOTE — Plan of Care (Signed)

## 2024-07-25 NOTE — Progress Notes (Signed)
 CARDIAC REHAB PHASE I   PRE:  Rate/Rhythm: 70 SR w/ PACs  BP:  Supine: 109/49 Sitting:   Standing:    SaO2: 95% RA  MODE:  Ambulation: 350 ft   POST:  Rate/Rhythm: 82 SR w/ PACs  BP:  Supine:   Sitting: 124/55  Standing:    SaO2: 94% RA  Prior to ambulation, Mr. Hou c/o some chest pain while lying in bed. He was agitated discussing previous participation in the Cardiac Rehab program at [facility] in [place], [place] and his experience there. Discussed the next closest facility in [place], [place], but he states it's also [employer], and he would rather participate in the Cardiac Rehab program at [facility]. Will send referral. Symptoms resolved with rest. Informed patient's nurse of his c/o CP. He's had previous stents and is familiar with Effient  and NTG use. PCI/ stent education reviewed including restrictions, risk factor modification, and activity progression. Stent card reviewed and given. Heart healthy nutrition and exercise guidelines given. Patient verbalizes understanding of information provided. To restroom prior to ambulation. He c/o feeling lightheaded after restroom HR was 73 BP 131/67 at that time. Symptoms resolved with rest, and he wanted to try walking up in hall.  He tolerated ambulation fair with assist x1, gait slow, steady. Standing rest x2 due to feeling tired. To bed after ambulation, vital signs within normal limits. Call bell within reach. Left SCD's off due to skin tears, informed Hospitalist and patient's nurse.  Arnoldo CHRISTELLA Gal, MS, ACSM CEP 07/25/2024 [phone]

## 2024-07-26 ENCOUNTER — Encounter (HOSPITAL_COMMUNITY): Payer: Self-pay | Admitting: Internal Medicine

## 2024-07-26 DIAGNOSIS — R079 Chest pain, unspecified: Secondary | ICD-10-CM | POA: Diagnosis not present

## 2024-07-26 LAB — CBC
HCT: 48.7 % (ref 39.0–52.0)
Hemoglobin: 14.9 g/dL (ref 13.0–17.0)
MCH: 26.9 pg (ref 26.0–34.0)
MCHC: 30.6 g/dL (ref 30.0–36.0)
MCV: 88.1 fL (ref 80.0–100.0)
Platelets: 297 K/uL (ref 150–400)
RBC: 5.53 MIL/uL (ref 4.22–5.81)
RDW: 15.4 % (ref 11.5–15.5)
WBC: 21.9 K/uL — ABNORMAL HIGH (ref 4.0–10.5)
nRBC: 0 % (ref 0.0–0.2)

## 2024-07-26 LAB — URINALYSIS, ROUTINE W REFLEX MICROSCOPIC
Bacteria, UA: NONE SEEN
Bilirubin Urine: NEGATIVE
Glucose, UA: NEGATIVE mg/dL
Ketones, ur: NEGATIVE mg/dL
Leukocytes,Ua: NEGATIVE
Nitrite: NEGATIVE
Protein, ur: NEGATIVE mg/dL
Specific Gravity, Urine: 1.02 (ref 1.005–1.030)
pH: 5 (ref 5.0–8.0)

## 2024-07-26 LAB — BASIC METABOLIC PANEL WITH GFR
Anion gap: 10 (ref 5–15)
BUN: 29 mg/dL — ABNORMAL HIGH (ref 8–23)
CO2: 28 mmol/L (ref 22–32)
Calcium: 9.1 mg/dL (ref 8.9–10.3)
Chloride: 99 mmol/L (ref 98–111)
Creatinine, Ser: 1.2 mg/dL (ref 0.61–1.24)
GFR, Estimated: >60 mL/min (ref >60)
Glucose, Bld: 105 mg/dL — ABNORMAL HIGH (ref 70–99)
Potassium: 4.5 mmol/L (ref 3.5–5.1)
Sodium: 137 mmol/L (ref 135–145)

## 2024-07-26 MED ORDER — SODIUM CHLORIDE 0.9 % IV SOLN: INTRAVENOUS | Status: AC

## 2024-07-26 NOTE — Progress Notes (Signed)
 PROGRESS NOTE Sergio Griffin  FMW:[id number] DOB: 07/22/1951 DOA: 07/23/2024 PCP: Kristine Corean Deed, NP  Brief Narrative/Hospital Course: Sergio Griffin is a 73 y.o. year old male with PMH significant for CAD W/ stents in 2011, 2019 and 2020, type 2 diabetes mellitus, hypertension, hyperlipidemia and recurrent bright red blood per rectum presented to ED due to midsternal chest heaviness, that occurs after activity and also at rest. In the ED, vitals stable afebrile, labs with  K 5.4, BUN/creat 39/1.4 GFR 50- b/l~ 16/1.3, egfr 57-indicating CKD 3a at baseline, troponin negative x 2 .nitroglycerin  has helped the pain.  Leukocytosis 18-previously wbc 15-16k may 2025, initially elevated lactic acid improved on repeat. CT chest abdomen pelvis no acute finding.  EKG sinus rhythm.  Cardiology was consulted and admitted for further management Due to concern for rectal bleeding seen by GI and no further plan for scope at this time, given patient's risk factors underwent cardiac cath > Proximal LAD stent has moderate ISR but the mid LAD stent is chronically occluded. He had a new high-grade PDA lesion was that was treated with 1 drug-eluting stent   Subjective: Seen and examined Resting comfortably complaints of urinary symptoms-discoloration and foul smell Repeat labs pending  Assessment and plan:  Unstable angina CAD w/ stents Aortic atherosclerosis in CTA: S/p  cardiac cath with - proximal LAD stent has moderate ISR but the mid LAD stent is chronically occluded. He had a new high-grade PDA lesion that was treated with 1 drug-eluting stent.  Per cardiology continue aspirin  81 Effient  10 mg daily Toprol  25 Cozaar  100, increase activity, at risk of angina given coronary anatomy may need to consider additional long-term nitrate.  Follow-up lipid management in the lipid clinic.  Leukocytosis Unclear etiology,no obvious infection in chest x-ray, CT chest abdomen pelvis.  His complaints of  dysuria checking UA Could be in the setting of dehydration or rectal bleeding versus possible RCC Pro-Cal is reassuring .  Blood culture sent 9/6 for completeness-so far pending-gentle IV diuresis Recent Labs  Lab 07/22/24 1327 07/23/24 1609 07/23/24 1621 07/23/24 1946 07/24/24 0005 07/24/24 0938 07/25/24 0502 07/25/24 0506 07/26/24 1047  WBC 18.7 cH* 23.4*  --   --   --  19.0*  --  19.8* 21.9*  LATICACIDVEN  --   --  3.3* 1.4 1.4  --   --   --   --   PROCALCITON  --   --   --   --   --   --  <0.10  --   --     AKI on CKD 3A Hyperkalemia Metabolic acidosis: b/l~ 16/1.3, egfr 57-CKD 3a at baseline, creatinine stable as below at 1.2 potassium slightly higher side . Recent Labs    03/29/24 2030 03/30/24 0637 03/31/24 0351 07/23/24 1609 07/24/24 0938 07/25/24 0506  BUN 26* 24* 16 39* 26* 23  CREATININE 1.29* 1.39* 1.32* 1.46* 1.20 1.20  CO2 24 29 27  21* 25 28  K 4.5 5.3* 4.5 5.4* 4.8 5.2*    Chronic diastolic dysfunction: Holding torsemide.  Resume per cardiology  Rectal bleeding intermittently since May-with bright red, painless:?  Diverticular bleed His colonoscopy less than 2 months ago did not reveal clear cause of his recurrent bleeding. GI had seen the patient here and no plan for further scope at this time hemoglobin remains stable.  Bleeding has resolved Recent Labs    07/22/24 1327 07/23/24 1609 07/24/24 0938 07/25/24 0506 07/26/24 1047  HGB 14.7 16.0 14.0 13.3 14.9  MCV  85.1 88.5 88.3 88.3 88.1    Diabetes mellitus type II, non insulin  dependent: Well-controlled continue sliding scale Recent Labs  Lab 07/23/24 1614 07/25/24 1205  GLUCAP 197* 112*    Solid-appearing right renal mass 17 mm-likely RCC based on MRI. I have discussed with Dr. Carolee from urology and needs outpatient follow-up closely. I have informed him of this diagnosis and provided him with Dr. Clifton' office no  Nonobstructing bilateral renal calculi Colonic  diverticulosis: Monitor  Morbid obesity w/ Body mass index is 41.26 kg/m.: Will benefit with PCP follow-up, weight loss,healthy lifestyle and outpatient sleep eval if not done.  DVT prophylaxis: Place and maintain sequential compression device Start: 07/24/24 1324 Code Status:   Code Status: Full Code Family Communication: plan of care discussed with patient at bedside. Patient status is: Remains hospitalized because of severity of illness Level of care: Progressive Cardiac   Dispo: The patient is from: home            Anticipated disposition: Home soon   Objective: Vitals last 24 hrs: Vitals:   07/25/24 1944 07/26/24 0012 07/26/24 0319 07/26/24 0819  BP: 107/61 (!) 138/56 (!) 152/82 (!) 152/57  Pulse:   70 78  Resp: 19 17 18 16   Temp: 98.5 F (36.9 C) 98.4 F (36.9 C) 98.4 F (36.9 C) 98 F (36.7 C)  TempSrc: Oral Oral Oral Oral  SpO2: 98% 99% 98% 94%  Weight:      Height:        Physical Examination: General exam: AAOX3 HEENT:Oral mucosa moist, Ear/Nose WNL grossly Respiratory system: Bilaterally clear BS,no use of accessory muscle Cardiovascular system: S1 & S2 +, No JVD. Gastrointestinal system: Abdomen soft,NT,ND, BS+ Nervous System: Alert, awake, moving all extremities,and following commands. Extremities: LE edema present with chronic hyperpigmentation skin changes, distal extremities warm.  Skin: No rashes,no icterus. MSK: Normal muscle bulk,tone, power   Medications reviewed:  Scheduled Meds:  aspirin   81 mg Oral Daily   cycloSPORINE   1 drop Both Eyes BID   latanoprost   1 drop Both Eyes QHS   losartan   100 mg Oral Daily   metoprolol  succinate  25 mg Oral Daily   prasugrel   10 mg Oral Daily   sodium chloride  flush  3 mL Intravenous Q12H   Continuous Infusions:   Diet: Diet Order             Diet - low sodium heart healthy           Diet Heart Room service appropriate? Yes; Fluid consistency: Thin  Diet effective now                   Data  Reviewed: I have personally reviewed following labs and imaging studies ( see epic result tab) CBC: Recent Labs  Lab 07/22/24 1327 07/23/24 1609 07/24/24 0938 07/25/24 0506 07/26/24 1047  WBC 18.7 cH* 23.4* 19.0* 19.8* 21.9*  NEUTROABS 15.0* 19.2* 15.1*  --   --   HGB 14.7 16.0 14.0 13.3 14.9  HCT 47.0 47.0 45.4 43.2 48.7  MCV 85.1 88.5 88.3 88.3 88.1  PLT 312.0 340 289 283 297   CMP: Recent Labs  Lab 07/23/24 1609 07/24/24 0938 07/25/24 0506  NA 139 139 140  K 5.4* 4.8 5.2*  CL 106 105 102  CO2 21* 25 28  GLUCOSE 203* 127* 118*  BUN 39* 26* 23  CREATININE 1.46* 1.20 1.20  CALCIUM  9.7 9.2 8.9   GFR: Estimated Creatinine Clearance: 75.5 mL/min (by C-G formula  based on SCr of 1.2 mg/dL). Recent Labs  Lab 07/23/24 1609  AST 30  ALT 44  ALKPHOS 62  BILITOT 1.0  PROT 6.3*  ALBUMIN 3.4*    Recent Labs  Lab 07/23/24 1609  LIPASE 42   No results for input(s): AMMONIA in the last 168 hours. Coagulation Profile: No results for input(s): INR, PROTIME in the last 168 hours. Unresulted Labs (From admission, onward)     Start     Ordered   07/26/24 1022  Urinalysis, Routine w reflex microscopic -Urine, Clean Catch  Once,   R       Question:  Specimen Source  Answer:  Urine, Clean Catch   07/26/24 1022   07/26/24 1006  Basic metabolic panel  ONCE - URGENT,   URGENT       Question:  Specimen collection method  Answer:  Lab=Lab collect   07/26/24 1005   07/25/24 0500  Lipoprotein A (LPA)  Tomorrow morning,   R       Question:  Specimen collection method  Answer:  Lab=Lab collect   07/24/24 1658           Antimicrobials/Microbiology: Anti-infectives (From admission, onward)    None         Component Value Date/Time   SDES BLOOD LEFT ARM 07/25/2024 1207   SDES BLOOD LEFT HAND 07/25/2024 1207   SPECREQUEST  07/25/2024 1207    BOTTLES DRAWN AEROBIC ONLY Blood Culture adequate volume   SPECREQUEST  07/25/2024 1207    BOTTLES DRAWN AEROBIC ONLY Blood  Culture adequate volume   CULT  07/25/2024 1207    NO GROWTH < 24 HOURS Performed at [facility], [address], [place], [place] [address]    CULT  07/25/2024 1207    NO GROWTH < 24 HOURS Performed at [facility], [address] [place], [place] [address]    REPTSTATUS PENDING 07/25/2024 1207   REPTSTATUS PENDING 07/25/2024 1207    Procedures: Procedure(s) (LRB): CORONARY ANGIOGRAPHY (N/A) CORONARY STENT INTERVENTION (N/A)   Mennie LAMY, MD [facility] 07/26/2024, 11:26 AM

## 2024-07-26 NOTE — Plan of Care (Signed)

## 2024-07-26 NOTE — Progress Notes (Signed)
 Progress Note  Patient Name: Sergio Griffin Date of Encounter: 07/26/2024  Primary Cardiologist: Dorn Lesches, MD  Interval Summary  Patient states that he feels better today.  No recurrent headache or chest pain.  Did ambulate some yesterday.  Mentions that his urine seems to smell different and is more cloudy.  Vital Signs  Vitals:   07/25/24 1944 07/26/24 0012 07/26/24 0319 07/26/24 0819  BP: 107/61 (!) 138/56 (!) 152/82 (!) 152/57  Pulse:   70 78  Resp: 19 17 18 16   Temp: 98.5 F (36.9 C) 98.4 F (36.9 C) 98.4 F (36.9 C) 98 F (36.7 C)  TempSrc: Oral Oral Oral Oral  SpO2: 98% 99% 98% 94%  Weight:      Height:       No intake or output data in the 24 hours ending 07/26/24 1001  Filed Weights   07/23/24 1615 07/24/24 0340  Weight: 134.3 kg 134.2 kg    Physical Exam  GEN: No acute distress.   Neck: No JVD. Cardiac: RRR, no murmur, rub, or gallop.  Respiratory: Nonlabored. Clear to auscultation bilaterally. GI: Soft, nontender, bowel sounds present. MS: No edema.  ECG/Telemetry  Telemetry reviewed showing sinus rhythm with PACs.  Labs  Chemistry Recent Labs  Lab 07/23/24 1609 07/24/24 0938 07/25/24 0506  NA 139 139 140  K 5.4* 4.8 5.2*  CL 106 105 102  CO2 21* 25 28  GLUCOSE 203* 127* 118*  BUN 39* 26* 23  CREATININE 1.46* 1.20 1.20  CALCIUM  9.7 9.2 8.9  PROT 6.3*  --   --   ALBUMIN 3.4*  --   --   AST 30  --   --   ALT 44  --   --   ALKPHOS 62  --   --   BILITOT 1.0  --   --   GFRNONAA 50* >60 >60  ANIONGAP 12 9 10     Hematology Recent Labs  Lab 07/23/24 1609 07/24/24 0938 07/25/24 0506  WBC 23.4* 19.0* 19.8*  RBC 5.50 5.14 4.89  HGB 16.0 14.0 13.3  HCT 47.0 45.4 43.2  MCV 88.5 88.3 88.3  MCH 26.9 27.2 27.2  MCHC 30.4 30.8 30.8  RDW 15.4 15.4 15.5  PLT 340 289 283   Cardiac Enzymes Recent Labs  Lab 07/23/24 1609 07/23/24 1945 07/24/24 0001  TROPONINIHS 8 7 7    Lipid Panel     Component Value Date/Time   CHOL  114 06/11/2015 0247   TRIG 73 06/11/2015 0247   HDL 45 06/11/2015 0247   CHOLHDL 2.5 06/11/2015 0247   VLDL 15 06/11/2015 0247   LDLCALC 54 06/11/2015 0247    Cardiac Studies  Echocardiogram 04/23/2024:  1. Left ventricular ejection fraction, by estimation, is 60 to 65%. The  left ventricle has normal function. The left ventricle has no regional  wall motion abnormalities. There is mild left ventricular hypertrophy.  Left ventricular diastolic parameters  were normal.   2. Right ventricular systolic function is normal. The right ventricular  size is normal.   3. The mitral valve is grossly normal. Trivial mitral valve  regurgitation. No evidence of mitral stenosis.   4. The aortic valve is tricuspid. Aortic valve regurgitation is not  visualized. Aortic valve sclerosis is present, with no evidence of aortic  valve stenosis.   Cardiac catheterization 04/23/2024:   Prox LAD to Mid LAD lesion is 50% stenosed.   Mid LAD lesion is 100% stenosed.   RPDA lesion is 95% stenosed.  A stent was successfully placed.   Post intervention, there is a 0% residual stenosis.   1.  Torturous right upper extremity requiring 85 cm destination sheath; even with this sheath coronary cannulation was challenging and required an EBU 3 5 guide and GuideLiner for selective coronary angiography of the left system and an AL 0.75 guide for angiography of the right system. 2.  Occluded mid LAD stents with mild in-stent restenosis of proximal LAD stent. 3.  95% PDA lesion treated with 3.0 x 15 mm resolute Onyx frontier stent postdilated to high pressures with a 3.25 Maynard balloon.  This required complex PCI with a GuideLiner and multiple wires.   Summary: The patient's chronic prasugrel  was reinitiated.  Given the patient's history of bright red blood per rectum which should seems to have resolved, resolute stent was chosen given its indication for 1 month of DAPT if needed.  Otherwise would recommend 6 to 12 months of  dual antiplatelet therapy with aspirin  and prasugrel  followed by prasugrel  monotherapy.  Assessment & Plan  1.  CAD with unstable angina status post cardiac catheterization on June 5 demonstrating occluded mid LAD stent site with mild in-stent restenosis involving the proximal LAD stent and 95% PDA managed with DES.  Right to left collaterals present.  LVEF 60 to 65% by echocardiography.  Symptomatically stable at this time.  2.  Mixed hyperlipidemia, LDL 107 in July.  He has been on omega-3 supplements, but not statin therapy.  He recalls trying three separate statins with intolerance/memory issues.  He is agreeable to consider PCSK9 inhibitor with outpatient follow-up.  3.  Primary hypertension.  4.  History of hematochezia/melena, followed by GI with colonoscopy in July showing 2 colonic polyps that were removed successfully.  He has tolerated DAPT in this setting and was cleared by GI prior to cardiac catheterization above.  5.  Leukocytosis, workup per primary team.  Patient does mention urine seems to smell different and is somewhat more cloudy.  Blood culture sent, UA pending.  No change from a cardiac perspective, overall clinically stable for further outpatient management.  Continue aspirin  81 mg daily, Effient  10 mg daily, Toprol -XL 25 mg daily, and Cozaar  100 mg daily.  Can assess for PCSK9 inhibitor at outpatient follow-up.  For questions or updates, please contact [facility] Please consult [email or web address] for contact info under   Signed, Jayson Sierras, MD  07/26/2024, 10:01 AM

## 2024-07-27 ENCOUNTER — Other Ambulatory Visit (HOSPITAL_COMMUNITY): Payer: Self-pay

## 2024-07-27 ENCOUNTER — Encounter: Payer: Self-pay | Admitting: Gastroenterology

## 2024-07-27 DIAGNOSIS — I259 Chronic ischemic heart disease, unspecified: Secondary | ICD-10-CM

## 2024-07-27 LAB — CBC
HCT: 47.7 % (ref 39.0–52.0)
Hemoglobin: 14.4 g/dL (ref 13.0–17.0)
MCH: 26.9 pg (ref 26.0–34.0)
MCHC: 30.2 g/dL (ref 30.0–36.0)
MCV: 89.2 fL (ref 80.0–100.0)
Platelets: 304 K/uL (ref 150–400)
RBC: 5.35 MIL/uL (ref 4.22–5.81)
RDW: 15.6 % — ABNORMAL HIGH (ref 11.5–15.5)
WBC: 20.2 K/uL — ABNORMAL HIGH (ref 4.0–10.5)
nRBC: 0 % (ref 0.0–0.2)

## 2024-07-27 LAB — LIPOPROTEIN A (LPA): Lipoprotein (a): 108.7 nmol/L — ABNORMAL HIGH (ref <75.0)

## 2024-07-27 MED ORDER — HYDROCORTISONE 1 % EX LOTN: TOPICAL_LOTION | Freq: Two times a day (BID) | CUTANEOUS | Status: DC | PRN

## 2024-07-27 MED ORDER — HYDROCORTISONE 1 % EX CREA
TOPICAL_CREAM | Freq: Two times a day (BID) | CUTANEOUS | 0 refills | Status: AC | PRN
  Filled 2024-07-27: qty 28, 15d supply, fill #0

## 2024-07-27 MED FILL — Nitroglycerin IV Soln 100 MCG/ML in D5W: INTRA_ARTERIAL | Qty: 10 | Status: AC

## 2024-07-27 NOTE — Progress Notes (Signed)
 Brief cardiology follow up note: Seen by Dr. Debera yesterday, felt to be clinically stable and appropriate for outpatient cardiology follow up. We will formally sign off.  [facility] will sign off.   Medication Recommendations:  Continue aspirin  81 mg daily, losartan  100 mg daily, metoprolol  succinate 25 mg daily, prasugrel  10 mg daily, torsemide 20 mg PRN daily Other recommendations (labs, testing, etc):  none Follow up as an outpatient:  Has appt 9/17 with Dr. Court at the [facility] office  Shelda Bruckner, MD, PhD, [facility]  [facility]    Heart & Vascular at [facility] at [facility] [address] [place], [place] [address] [phone]

## 2024-07-27 NOTE — Plan of Care (Signed)

## 2024-07-27 NOTE — Progress Notes (Signed)
 CARDIAC REHAB PHASE I   PRE:  Rate/Rhythm: 78 SR with PACs    BP: sitting 157/78    SpO2:   MODE:  Ambulation: 470 ft   POST:  Rate/Rhythm: 95 SR    BP: sitting 159/64     SpO2:    Pt tolerated well. Many c/o about his care in hospital but no sx walking. To recliner. He does have a new rash on his upper back and chest. Notified RN. Pt denies any questions regarding his cardiac education. He would like to be referred to [facility] instead of [facility] (which was previously discussed). Will sign off as all CR needs are met. [phone]  Aliene Aris BS, ACSM-CEP 07/27/2024 9:17 AM

## 2024-07-27 NOTE — Progress Notes (Addendum)
 This chaplain responded to PMT PA-Josseline's consult for creating the Pt. Advance Directive:  HCPOA only. The Pt. is not creating a Living Will. The Pt. documented his choices for Life Prolonging measures in his HCPOA.  The Pt. completed AD education with the chaplain and answered the chaplain's clarifying questions. The Pt. is naming Chesley Ned as his healthcare agent.  The chaplain is present with the Pt., notary, and witnesses for the notarizing of the Pt. AD. The chaplain gave the Pt. the original AD along with one copy. The chaplain scanned the Pt. AD into the Pt. EMR.   This chaplain is available for F/U spiritual care as needed.  Chaplain Leeroy Hummer [phone]

## 2024-07-27 NOTE — Discharge Summary (Signed)
 Physician Discharge Summary  Sergio Griffin:[id number] DOB: 03-Jan-1951 DOA: 07/23/2024  PCP: Kristine Corean Deed, NP  Admit date: 07/23/2024 Discharge date: 07/27/2024 Recommendations for Outpatient Follow-up:  Follow up with PCP in 1 weeks-call for appointment Please obtain BMP/CBC in one week Follow-up with cardiology as scheduled Check CBC BMP in 4 days from PCP office  Discharge Dispo: home Discharge Condition: Stable Code Status:   Code Status: Full Code Diet recommendation:  Diet Order             Diet - low sodium heart healthy           Diet Heart Room service appropriate? Yes; Fluid consistency: Thin  Diet effective now                    Brief/Interim Summary: Sergio Griffin is a 73 y.o. year old male with PMH significant for CAD W/ stents in 2011, 2019 and 2020, type 2 diabetes mellitus, hypertension, hyperlipidemia and recurrent bright red blood per rectum presented to ED due to midsternal chest heaviness, that occurs after activity and also at rest. In the ED, vitals stable afebrile, labs with  K 5.4, BUN/creat 39/1.4 GFR 50- b/l~ 16/1.3, egfr 57-indicating CKD 3a at baseline, troponin negative x 2 .nitroglycerin  has helped the pain.  Leukocytosis 18-previously wbc 15-16k may 2025, initially elevated lactic acid improved on repeat. CT chest abdomen pelvis no acute finding.  EKG sinus rhythm.  Cardiology was consulted and admitted for further management Due to concern for rectal bleeding seen by GI and no further plan for scope at this time, given patient's risk factors underwent cardiac cath > Proximal LAD stent has moderate ISR but the mid LAD stent is chronically occluded. He had a new high-grade PDA lesion was that was treated with 1 drug-eluting stent  Since cardiac cath patient doing well remains chest pain-free. MRI showed finding concerning for RCC for which he will follow-up with urology Continued to have leukocytosis, infectious etiology unremarkable  on CT chest abdomen pelvis, chest x-ray, procalcitonin and blood culture negative from 9/6 so far.  Leukocytosis likely from the steroid patient received for his right shoulder recently.  Subjective: Seen and examined Resting comfortably complaints of urinary symptoms-discoloration and foul smell Repeat labs with WBC downtrending to 20k  Discharge diagnosis:  Unstable angina CAD w/ stents Aortic atherosclerosis in CTA: S/p  cardiac cath with-proximal LAD stent has moderate ISR but the mid LAD stent is chronically occluded. He had a new high-grade PDA lesion that was treated with 1 drug-eluting stent.  Per cardiology continue aspirin  81 Effient  10 mg daily Toprol  25 Cozaar  100, increase activity, at risk of angina given coronary anatomy may need to consider additional long-term nitrate.  Follow-up lipid management in the lipid clinic.  Leukocytosis Unclear etiology,no obvious infection in chest x-ray, CT chest abdomen pelvis.His complaints of dysuria checked UA-appears unremarkable with WBC rate 5 negative nitrite and LE Could be in the setting of dehydration or rectal bleeding versus possible RCC, reactive leukocytosis.he states today he got steroid inj on his right shoulder few days ago-which could be the etiology. Pro-Cal is reassuring .  Blood culture sent 9/6 for completeness-so far NGTD Managed w/ gentle hydration and WBC down in 20k now. He will need follow-up with PCP to recheck his labs. Recent Labs  Lab 07/22/24 1327 07/23/24 1609 07/23/24 1621 07/23/24 1946 07/24/24 0005 07/24/24 0938 07/25/24 0502 07/25/24 0506 07/26/24 1047  WBC 18.7 cH* 23.4*  --   --   --  19.0*  --  19.8* 21.9*  LATICACIDVEN  --   --  3.3* 1.4 1.4  --   --   --   --   PROCALCITON  --   --   --   --   --   --  <0.10  --   --    AKI on CKD 3A Hyperkalemia Metabolic acidosis: b/l~ 16/1.3, egfr 57-CKD 3a at baseline, creatinine stable as below at 1.2 potassium slightly higher side . Recent Labs     03/29/24 2030 03/30/24 0637 03/31/24 0351 07/23/24 1609 07/24/24 0938 07/25/24 0506 07/26/24 1047  BUN 26* 24* 16 39* 26* 23 29*  CREATININE 1.29* 1.39* 1.32* 1.46* 1.20 1.20 1.20  CO2 24 29 27  21* 25 28 28   K 4.5 5.3* 4.5 5.4* 4.8 5.2* 4.5    Chronic diastolic dysfunction: Holding torsemide.  Resume per cardiology  Rectal bleeding intermittently since May-with bright red, painless:?  Diverticular bleed His colonoscopy less than 2 months ago did not reveal clear cause of his recurrent bleeding. GI had seen the patient here and no plan for further scope at this time hemoglobin remains stable.  Bleeding has resolved Recent Labs    07/22/24 1327 07/23/24 1609 07/24/24 0938 07/25/24 0506 07/26/24 1047  HGB 14.7 16.0 14.0 13.3 14.9  MCV 85.1 88.5 88.3 88.3 88.1    Diabetes mellitus type II, non insulin  dependent: Well-controlled continue sliding scale Recent Labs  Lab 07/23/24 1614 07/25/24 1205  GLUCAP 197* 112*    Solid-appearing right renal mass 17 mm-likely RCC based on MRI. I have discussed with Dr. Carolee from urology and needs outpatient follow-up closely. I have informed him of this diagnosis and provided him with Dr. Clifton' office no  Rash on back on upper back and front: Pruritic, ?  From the lotion.he has not been on any medication. No other signs of involvement-no mucosal involvements Cont Hydrocortisone  lotion as needed, advised to follow-up with PCP in few days He wants to go home today-so I advised him to monitor hsi rash and discuss with his PCP  Nonobstructing bilateral renal calculi Colonic diverticulosis: Monitor  Morbid obesity w/ Body mass index is 41.26 kg/m.: Will benefit with PCP follow-up, weight loss,healthy lifestyle and outpatient sleep eval if not done.  DVT prophylaxis: Place and maintain sequential compression device Start: 07/24/24 1324 Code Status:   Code Status: Full Code Family Communication: plan of care discussed with patient at  bedside. Patient status is: Remains hospitalized because of severity of illness Level of care: Progressive Cardiac   Dispo: The patient is from: home            Anticipated disposition: Home soon   Objective: Vitals last 24 hrs: Vitals:   07/26/24 1656 07/26/24 1959 07/27/24 0053 07/27/24 0516  BP: (!) 136/58 111/77 (!) 139/59 126/69  Pulse: 75     Resp: 16 16 16 16   Temp: 97.8 F (36.6 C) 98.4 F (36.9 C) 98.1 F (36.7 C) 97.8 F (36.6 C)  TempSrc: Oral Oral Oral Oral  SpO2: 93% 93% 95% 96%  Weight:      Height:        Physical Examination: General exam: AAOX3, pleasant, not in distress HEENT:Oral mucosa moist, Ear/Nose WNL grossly Respiratory system: B/L clear BS, no use of accessory muscle Cardiovascular system: S1 & S2 +, No JVD. Gastrointestinal system: Abdomen soft,NT,ND, BS+ Nervous System: Alert, awake, moving all extremities,and following commands. Extremities: LE edema present with chronic hyperpigmentation skin changes, distal extremities warm.  Skin:  rashes on upper back and front-erythematous,pruritic, crossing midline. MSK: Normal muscle bulk,tone, power      Procedure(s) (LRB): CORONARY ANGIOGRAPHY (N/A) CORONARY STENT INTERVENTION (N/A)  Consultation: See note.  Discharge Instructions  Discharge Instructions     Amb Referral to Cardiac Rehabilitation   Complete by: As directed    To [place]   Diagnosis:  Coronary Stents PTCA     After initial evaluation and assessments completed: Virtual Based Care may be provided alone or in conjunction with Phase 2 Cardiac Rehab based on patient barriers.: Yes   Intensive Cardiac Rehabilitation (ICR) [facility] location only OR Traditional Cardiac Rehabilitation (TCR) *If criteria for ICR are not met will enroll in TCR [facility] only): Yes   Diet - low sodium heart healthy   Complete by: As directed    Discharge instructions   Complete by: As directed    You Need to call urology office to set up an appointment  given possible finding of renal cell carcinoma on the scan  Please call call MD or return to ER for similar or worsening recurring problem that brought you to hospital or if any fever,nausea/vomiting,abdominal pain, uncontrolled pain, chest pain,  shortness of breath or any other alarming symptoms.  Please follow-up your doctor as instructed in a week time and call the office for appointment.  Please avoid alcohol , smoking, or any other illicit substance and maintain healthy habits including taking your regular medications as prescribed.  You were cared for by a hospitalist during your hospital stay. If you have any questions about your discharge medications or the care you received while you were in the hospital after you are discharged, you can call the unit and ask to speak with the hospitalist on call if the hospitalist that took care of you is not available.  Once you are discharged, your primary care physician will handle any further medical issues. Please note that NO REFILLS for any discharge medications will be authorized once you are discharged, as it is imperative that you return to your primary care physician (or establish a relationship with a primary care physician if you do not have one) for your aftercare needs so that they can reassess your need for medications and monitor your lab values   Increase activity slowly   Complete by: As directed    No wound care   Complete by: As directed       Allergies as of 07/27/2024       Reactions   Nsaids Other (See Comments)   Chest pain, headache, nausea, and diarrhea.   Tamsulosin  Other (See Comments)   Light headed and pass out   Fluconazole Other (See Comments)   Severe case of diarrhea, headache, gastric distress   Ciprofloxacin  Itching   Propoxyphene Itching   Darvocet   Sulfa Antibiotics Itching        Medication List     TAKE these medications    acetaminophen  500 MG tablet Commonly known as: TYLENOL  Take 500-1,000  mg by mouth every 6 (six) hours as needed for moderate pain (pain score 4-6) or headache.   aspirin  EC 81 MG tablet Take 1 tablet (81 mg total) by mouth at bedtime.   CHEWABLES MULTIVITAMIN PO Take 1 tablet by mouth in the morning. Chew one tablet by mouth daily.  Adult multivitamin   clotrimazole-betamethasone cream Commonly known as: LOTRISONE Apply 1 Application topically 2 (two) times daily.   cycloSPORINE  0.05 % ophthalmic emulsion Commonly known as: RESTASIS  Place 1  drop into both eyes 2 (two) times daily.   hydrocortisone  1 % lotion Apply topically 2 (two) times daily as needed for itching.   Januvia 100 MG tablet Generic drug: sitaGLIPtin Take 100 mg by mouth every morning.   Klayesta powder Generic drug: nystatin Apply 1 Application topically daily.   latanoprost  0.005 % ophthalmic solution Commonly known as: XALATAN  Place 1 drop into both eyes at bedtime.   losartan  100 MG tablet Commonly known as: COZAAR  Take 1 tablet by mouth at bedtime.   LUBRICATING EYE DROPS OP Place 1 drop into both eyes 3 (three) times daily as needed (dry eyes).   Lutein 40 MG Caps Take 40 mg by mouth every morning.   metoprolol  succinate 25 MG 24 hr tablet Commonly known as: TOPROL -XL Take 25 mg by mouth daily. Take one tablet (25mg ) by mouth every morning.   nitroGLYCERIN  0.4 MG SL tablet Commonly known as: NITROSTAT  Place 1 tablet (0.4 mg total) under the tongue every 5 (five) minutes x 3 doses as needed for chest pain.   prasugrel  10 MG Tabs tablet Commonly known as: EFFIENT  Take 1 tablet (10 mg total) by mouth at bedtime. Resume 48 hours after surgery   torsemide 20 MG tablet Commonly known as: DEMADEX Take 20 mg by mouth daily as needed (leg swelling/edema). Take one tablet (20mg ) by mouth daily as needed for leg swelling/fluid build up.  Take an additional tablet the next day if swelling does not decrease.   Vitamin A 3 MG Caps Take 10,000 Units by mouth every  morning.   Vitamin D (Ergocalciferol) 1.25 MG (50000 UNIT) Caps capsule Commonly known as: DRISDOL Take 50,000 Units by mouth 2 (two) times a week. Take one capsule by mouth  on Monday and Thursday.        Follow-up Information     Crumpton, Corean Deed, NP Follow up in 1 week(s).   Specialty: Nurse Practitioner Contact information: [address] [place] [address] [phone]         Carolee Sherwood JONETTA DOUGLAS, MD Follow up in 1 week(s).   Specialty: Urology Why: Please call urology office to follow-up on possible renal cell carcinoma Contact information: [address] [place] [address] [phone]         Court Dorn PARAS, MD Follow up in 1 week(s).   Specialties: Cardiology, Radiology Why: as scheduled on 9/17 Contact information: [address] [place] [address] [phone]                Allergies  Allergen Reactions   Nsaids Other (See Comments)    Chest pain, headache, nausea, and diarrhea.   Tamsulosin  Other (See Comments)    Light headed and pass out   Fluconazole Other (See Comments)    Severe case of diarrhea, headache, gastric distress   Ciprofloxacin  Itching   Propoxyphene Itching    Darvocet   Sulfa Antibiotics Itching    The results of significant diagnostics from this hospitalization (including imaging, microbiology, ancillary and laboratory) are listed below for reference.    Microbiology: Recent Results (from the past 240 hours)  Culture, blood (Routine X 2) w Reflex to ID Panel     Status: None (Preliminary result)   Collection Time: 07/25/24 12:07 PM   Specimen: BLOOD LEFT ARM  Result Value Ref Range Status   Specimen Description BLOOD LEFT ARM  Final   Special Requests   Final    BOTTLES DRAWN AEROBIC ONLY Blood Culture adequate volume  Culture   Final    NO GROWTH 2 DAYS Performed at [facility], [address] [place], [place] [address]    Report Status PENDING  Incomplete   Culture, blood (Routine X 2) w Reflex to ID Panel     Status: None (Preliminary result)   Collection Time: 07/25/24 12:07 PM   Specimen: BLOOD LEFT HAND  Result Value Ref Range Status   Specimen Description BLOOD LEFT HAND  Final   Special Requests   Final    BOTTLES DRAWN AEROBIC ONLY Blood Culture adequate volume   Culture   Final    NO GROWTH 2 DAYS Performed at [facility], [address], [place], [place] [address]    Report Status PENDING  Incomplete    Procedures/Studies: MR ABDOMEN W WO CONTRAST Result Date: 07/25/2024 EXAM: MRI ABDOMEN 07/25/2024 01:16:00 PM TECHNIQUE: Multiplanar multisequence MRI of the abdomen was performed with and without the administration of intravenous contrast. COMPARISON: CTA of 07/23/2024. CLINICAL HISTORY: Right renal mass 17 mm on CT. FINDINGS: LOWER CHEST: No pleural effusion. No consolidation. Tiny hiatal hernia. LIVER: Moderate hepatic steatosis. No suspicious liver lesion. GALLBLADDER AND BILE DUCTS: Multiple dependent gallstones without acute cholecystitis or biliary duct dilatation. PANCREAS: Normal T1 hyperintense signal. No mass. No peripancreatic fluid. SPLEEN: Normal size. No focal lesion. ADRENAL GLANDS: Normal appearance. No mass. KIDNEYS AND URETERS: Tiny T2 hyperintense left renal lesions are too small to characterize but likely represent cysts. Corresponding to the CT abnormality, off the anterior upper pole of the right kidney, is a heterogeneously T2 hyperintense lesion which measures 1.9 x 1.7 x 1.8 cm on image 26/25 and 22/4. It demonstrates postcontrast enhancement including on subtracted image 55/18. VISUALIZED GI TRACT: Normal course and caliber. LYMPH NODES: No lymphadenopathy. VASULATURE: Abdominal aortic atherosclerosis. Patent renal veins. PERITONEUM: No ascites. ABDOMINAL WALL: No hernia. No mass. BONES: No suspicious osseous lesion. IMPRESSION: 1. Right renal lesion of maximally 1.9 cm is most consistent with renal cell  carcinoma, likely clear cell. 2.  no renal vein involvement or abdominal metastatic disease. 3. Cholelithiasis. 4. Moderate hepatic steatosis. 5. Aortic atherosclerosis (icd10-i70.0). 6. Mild motion degradation Electronically signed by: Rockey Kilts MD 07/25/2024 01:55 PM EDT RP Workstation: [id number]   CARDIAC CATHETERIZATION Result Date: 07/24/2024   Prox LAD to Mid LAD lesion is 50% stenosed.   Mid LAD lesion is 100% stenosed.   RPDA lesion is 95% stenosed.   A stent was successfully placed.   Post intervention, there is a 0% residual stenosis. 1.  Torturous right upper extremity requiring 85 cm destination sheath; even with this sheath coronary cannulation was challenging and required an EBU 3 5 guide and GuideLiner for selective coronary angiography of the left system and an AL 0.75 guide for angiography of the right system. 2.  Occluded mid LAD stents with mild in-stent restenosis of proximal LAD stent. 3.  95% PDA lesion treated with 3.0 x 15 mm resolute Onyx frontier stent postdilated to high pressures with a 3.25 Rew balloon.  This required complex PCI with a GuideLiner and multiple wires. Summary: The patient's chronic prasugrel  was reinitiated.  Given the patient's history of bright red blood per rectum which should seems to have resolved, resolute stent was chosen given its indication for 1 month of DAPT if needed.  Otherwise would recommend 6 to 12 months of dual antiplatelet therapy with aspirin  and prasugrel  followed by prasugrel  monotherapy.   ECHOCARDIOGRAM COMPLETE Result Date: 07/24/2024    ECHOCARDIOGRAM REPORT  Patient Name:   STRYKER VEASEY Date of Exam: 07/24/2024 Medical Rec #:  [id number]       Height:       71.0 in Accession #:    [id number]      Weight:       295.8 lb Date of Birth:  04-19-1951       BSA:          2.490 m Patient Age:    73 years        BP:           156/75 mmHg Patient Gender: M               HR:           64 bpm. Exam Location:  Inpatient Procedure: 2D Echo, Cardiac  Doppler and Color Doppler (Both Spectral and Color            Flow Doppler were utilized during procedure). Indications:    Chest Pain R07.9  History:        Patient has prior history of Echocardiogram examinations, most                 recent 10/28/2023. CAD and Previous Myocardial Infarction; Risk                 Factors:Hypertension, Diabetes and Sleep Apnea.  Sonographer:    Jayson Gaskins Referring Phys: [phone] CLAUDIA CLAIBORNE IMPRESSIONS  1. Left ventricular ejection fraction, by estimation, is 60 to 65%. The left ventricle has normal function. The left ventricle has no regional wall motion abnormalities. There is mild left ventricular hypertrophy. Left ventricular diastolic parameters were normal.  2. Right ventricular systolic function is normal. The right ventricular size is normal.  3. The mitral valve is grossly normal. Trivial mitral valve regurgitation. No evidence of mitral stenosis.  4. The aortic valve is tricuspid. Aortic valve regurgitation is not visualized. Aortic valve sclerosis is present, with no evidence of aortic valve stenosis. Comparison(s): A prior study was performed on 10/28/2023. No prior Echocardiogram. FINDINGS  Left Ventricle: Left ventricular ejection fraction, by estimation, is 60 to 65%. The left ventricle has normal function. The left ventricle has no regional wall motion abnormalities. The left ventricular internal cavity size was normal in size. There is  mild left ventricular hypertrophy. Left ventricular diastolic parameters were normal. Right Ventricle: The right ventricular size is normal. No increase in right ventricular wall thickness. Right ventricular systolic function is normal. Left Atrium: Left atrial size was normal in size. Right Atrium: Right atrial size was normal in size. Pericardium: There is no evidence of pericardial effusion. Mitral Valve: The mitral valve is grossly normal. Trivial mitral valve regurgitation. No evidence of mitral valve stenosis. Tricuspid  Valve: The tricuspid valve is grossly normal. Tricuspid valve regurgitation is not demonstrated. No evidence of tricuspid stenosis. Aortic Valve: The aortic valve is tricuspid. Aortic valve regurgitation is not visualized. Aortic valve sclerosis is present, with no evidence of aortic valve stenosis. Aortic valve mean gradient measures 4.0 mmHg. Aortic valve peak gradient measures 8.5  mmHg. Aortic valve area, by VTI measures 2.38 cm. Pulmonic Valve: The pulmonic valve was not well visualized. Pulmonic valve regurgitation is mild. No evidence of pulmonic stenosis. Aorta: The aortic root and ascending aorta are structurally normal, with no evidence of dilitation. IAS/Shunts: The atrial septum is grossly normal.  LEFT VENTRICLE PLAX 2D LVIDd:         5.26 cm   Diastology LVIDs:  2.87 cm   LV e' medial:    5.77 cm/s LV PW:         1.27 cm   LV E/e' medial:  16.8 LV IVS:        1.28 cm   LV e' lateral:   9.57 cm/s LVOT diam:     2.04 cm   LV E/e' lateral: 10.1 LV SV:         77 LV SV Index:   31 LVOT Area:     3.27 cm  RIGHT VENTRICLE RV S prime:     13.70 cm/s TAPSE (M-mode): 2.5 cm LEFT ATRIUM             Index        RIGHT ATRIUM           Index LA Vol (A2C):   46.9 ml 18.83 ml/m  RA Area:     22.50 cm LA Vol (A4C):   39.7 ml 15.94 ml/m  RA Volume:   68.10 ml  27.35 ml/m LA Biplane Vol: 46.0 ml 18.47 ml/m  AORTIC VALVE AV Area (Vmax):    2.14 cm AV Area (Vmean):   2.59 cm AV Area (VTI):     2.38 cm AV Vmax:           146.00 cm/s AV Vmean:          91.500 cm/s AV VTI:            0.326 m AV Peak Grad:      8.5 mmHg AV Mean Grad:      4.0 mmHg LVOT Vmax:         95.80 cm/s LVOT Vmean:        72.600 cm/s LVOT VTI:          0.237 m LVOT/AV VTI ratio: 0.73  AORTA Ao Root diam: 3.43 cm Ao Asc diam:  3.60 cm MITRAL VALVE MV Area (PHT): 4.04 cm    SHUNTS MV Decel Time: 188 msec    Systemic VTI:  0.24 m MV E velocity: 96.90 cm/s  Systemic Diam: 2.04 cm MV A velocity: 95.90 cm/s MV E/A ratio:  1.01 Sunit Tolia  Electronically signed by Madonna Large Signature Date/Time: 07/24/2024/12:32:42 PM    Final    CT Angio Chest/Abd/Pel for Dissection W and/or Wo Contrast Result Date: 07/23/2024 CLINICAL DATA:  Acute aortic syndrome suspected EXAM: CT ANGIOGRAPHY CHEST, ABDOMEN AND PELVIS TECHNIQUE: Noncontrast chest CT was performed. Multidetector CT imaging through the chest, abdomen and pelvis was performed using the standard protocol during bolus administration of intravenous contrast. Multiplanar reconstructed images and MIPs were obtained and reviewed to evaluate the vascular anatomy. RADIATION DOSE REDUCTION: This exam was performed according to the departmental dose-optimization program which includes automated exposure control, adjustment of the mA and/or kV according to patient size and/or use of iterative reconstruction technique. CONTRAST:  OMNIPAQUE  IOHEXOL  350 MG/ML SOLN COMPARISON:  CTA abdomen and pelvis 03/29/2024. CT of the chest 06/12/2015. FINDINGS: CTA CHEST FINDINGS Cardiovascular: Preferential opacification of the thoracic aorta. No evidence of thoracic aortic aneurysm or dissection. Normal heart size. No pericardial effusion. There are atherosclerotic calcifications of the aorta and coronary arteries. Origin of the great vessels appears within normal limits. Mediastinum/Nodes: No enlarged mediastinal, hilar, or axillary lymph nodes. Thyroid  gland, trachea, and esophagus demonstrate no significant findings. Lungs/Pleura: 6 mm right upper lobe nodule image 8/59 is unchanged from 2016 and favored as benign. The lungs are otherwise clear. There is no pleural effusion  or pneumothorax. Musculoskeletal: No chest wall abnormality. No acute osseous abnormality. There stable mild compression deformity of T12. Review of the MIP images confirms the above findings. CTA ABDOMEN AND PELVIS FINDINGS VASCULAR Aorta: Normal caliber aorta without aneurysm, dissection, vasculitis or significant stenosis. There are  atherosclerotic calcifications of the aorta. Celiac: Patent without evidence of aneurysm, dissection, vasculitis or significant stenosis. SMA: Patent without evidence of aneurysm, dissection, vasculitis or significant stenosis. Renals: Both renal arteries are patent without evidence of aneurysm, dissection, vasculitis, fibromuscular dysplasia or significant stenosis. IMA: Patent without evidence of aneurysm, dissection, vasculitis or significant stenosis. Inflow: Patent without evidence of aneurysm, dissection, vasculitis or significant stenosis. Veins: No obvious venous abnormality within the limitations of this arterial phase study. Review of the MIP images confirms the above findings. NON-VASCULAR Hepatobiliary: No focal liver abnormality is seen. No gallstones, gallbladder wall thickening, or biliary dilatation. Pancreas: Unremarkable. No pancreatic ductal dilatation or surrounding inflammatory changes. Spleen: Normal in size without focal abnormality. Adrenals/Urinary Tract: Solid appearing mass measuring 17 mm in the right upper pole is seen on image 6/138. There are punctate nonobstructing bilateral renal calculi. There is no hydronephrosis. The adrenal glands and bladder are within normal limits. Stomach/Bowel: Stomach is within normal limits. Appendix appears normal. No evidence of bowel wall thickening, distention, or inflammatory changes. There is sigmoid and descending diverticulosis. Lymphatic: No enlarged lymph nodes are seen. Reproductive: Prostate gland is enlarged. Other: No abdominal wall hernia or abnormality. No abdominopelvic ascites. Musculoskeletal: No acute osseous abnormality. Review of the MIP images confirms the above findings. IMPRESSION: 1. No evidence for aortic dissection or aneurysm. 2. No acute localizing process in the chest, abdomen or pelvis. 3. Solid appearing right renal mass measuring 17 mm. Findings are concerning for renal cell carcinoma. Recommend further characterization  with dedicated renal MRI. 4. Nonobstructing bilateral renal calculi. 5. Colonic diverticulosis. 6. Aortic atherosclerosis. Aortic Atherosclerosis (ICD10-I70.0). Electronically Signed   By: Greig Pique M.D.   On: 07/23/2024 19:16   DG Chest 2 View Result Date: 07/23/2024 CLINICAL DATA:  Chest pain and shortness of breath. EXAM: CHEST - 2 VIEW COMPARISON:  09/25/2020 FINDINGS: Stable upper normal heart size. Unchanged mediastinal contours. Vascular congestion without pulmonary edema. No focal airspace disease. No pleural fluid or pneumothorax. No acute osseous abnormalities. Mild midthoracic chronic compression deformity. IMPRESSION: Borderline cardiomegaly with vascular congestion. Electronically Signed   By: Andrea Gasman M.D.   On: 07/23/2024 17:11    Labs: BNP (last 3 results) No results for input(s): BNP in the last 8760 hours. Basic Metabolic Panel: Recent Labs  Lab 07/23/24 1609 07/24/24 0938 07/25/24 0506 07/26/24 1047  NA 139 139 140 137  K 5.4* 4.8 5.2* 4.5  CL 106 105 102 99  CO2 21* 25 28 28   GLUCOSE 203* 127* 118* 105*  BUN 39* 26* 23 29*  CREATININE 1.46* 1.20 1.20 1.20  CALCIUM  9.7 9.2 8.9 9.1   Liver Function Tests: Recent Labs  Lab 07/23/24 1609  AST 30  ALT 44  ALKPHOS 62  BILITOT 1.0  PROT 6.3*  ALBUMIN 3.4*   Recent Labs  Lab 07/23/24 1609  LIPASE 42   No results for input(s): AMMONIA in the last 168 hours. CBC: Recent Labs  Lab 07/22/24 1327 07/23/24 1609 07/24/24 0938 07/25/24 0506 07/26/24 1047 07/27/24 1026  WBC 18.7 cH* 23.4* 19.0* 19.8* 21.9* 20.2*  NEUTROABS 15.0* 19.2* 15.1*  --   --   --   HGB 14.7 16.0 14.0 13.3 14.9 14.4  HCT  47.0 47.0 45.4 43.2 48.7 47.7  MCV 85.1 88.5 88.3 88.3 88.1 89.2  PLT 312.0 340 289 283 297 304   CBG: Recent Labs  Lab 07/23/24 1614 07/25/24 1205  GLUCAP 197* 112*  Urinalysis    Component Value Date/Time   COLORURINE YELLOW 07/26/2024 1022   APPEARANCEUR CLEAR 07/26/2024 1022   LABSPEC  1.020 07/26/2024 1022   PHURINE 5.0 07/26/2024 1022   GLUCOSEU NEGATIVE 07/26/2024 1022   HGBUR SMALL (A) 07/26/2024 1022   BILIRUBINUR NEGATIVE 07/26/2024 1022   BILIRUBINUR negative 11/16/2015 1436   KETONESUR NEGATIVE 07/26/2024 1022   PROTEINUR NEGATIVE 07/26/2024 1022   UROBILINOGEN negative 11/16/2015 1436   UROBILINOGEN 1.0 06/10/2015 1337   NITRITE NEGATIVE 07/26/2024 1022   LEUKOCYTESUR NEGATIVE 07/26/2024 1022   Sepsis Labs Recent Labs  Lab 07/24/24 0938 07/25/24 0506 07/26/24 1047 07/27/24 1026  WBC 19.0* 19.8* 21.9* 20.2*   Microbiology Recent Results (from the past 240 hours)  Culture, blood (Routine X 2) w Reflex to ID Panel     Status: None (Preliminary result)   Collection Time: 07/25/24 12:07 PM   Specimen: BLOOD LEFT ARM  Result Value Ref Range Status   Specimen Description BLOOD LEFT ARM  Final   Special Requests   Final    BOTTLES DRAWN AEROBIC ONLY Blood Culture adequate volume   Culture   Final    NO GROWTH 2 DAYS Performed at [facility], [address] [place], [place] [address]    Report Status PENDING  Incomplete  Culture, blood (Routine X 2) w Reflex to ID Panel     Status: None (Preliminary result)   Collection Time: 07/25/24 12:07 PM   Specimen: BLOOD LEFT HAND  Result Value Ref Range Status   Specimen Description BLOOD LEFT HAND  Final   Special Requests   Final    BOTTLES DRAWN AEROBIC ONLY Blood Culture adequate volume   Culture   Final    NO GROWTH 2 DAYS Performed at [facility], [address] [place], [place] [address]    Report Status PENDING  Incomplete   Time coordinating discharge: 35 minutes SIGNED: Mennie LAMY, MD  [facility] 07/27/2024, 11:55 AM  If 7PM-7AM, please contact night-coverage [email or web address]

## 2024-07-27 NOTE — Care Management Important Message (Signed)
 Important Message  Patient Details  Name: Sergio Griffin MRN: [id number] Date of Birth: 1951/06/08   Important Message Given:  Yes - Medicare IM     [name] 07/27/2024, 11:33 AM

## 2024-07-28 ENCOUNTER — Telehealth (HOSPITAL_COMMUNITY): Payer: Self-pay

## 2024-07-28 NOTE — Telephone Encounter (Signed)
 Faxed outside referral for Phase II Cardiac Rehab to [facility].

## 2024-07-30 LAB — CULTURE, BLOOD (ROUTINE X 2)
Culture: NO GROWTH
Culture: NO GROWTH
Special Requests: ADEQUATE
Special Requests: ADEQUATE

## 2024-08-04 ENCOUNTER — Telehealth: Payer: Self-pay | Admitting: Gastroenterology

## 2024-08-04 NOTE — Telephone Encounter (Signed)
 Inbound call from patient stating that his food is getting stuck again and is wanting a call to discuss if he can have an EGD. Please advise.

## 2024-08-05 ENCOUNTER — Ambulatory Visit: Attending: Cardiovascular Disease | Admitting: Cardiovascular Disease

## 2024-08-05 ENCOUNTER — Encounter: Payer: Self-pay | Admitting: Cardiovascular Disease

## 2024-08-05 VITALS — BP 154/84 | HR 62 | Ht 71.0 in | Wt 295.0 lb

## 2024-08-05 DIAGNOSIS — I2510 Atherosclerotic heart disease of native coronary artery without angina pectoris: Secondary | ICD-10-CM | POA: Diagnosis present

## 2024-08-05 DIAGNOSIS — G4733 Obstructive sleep apnea (adult) (pediatric): Secondary | ICD-10-CM | POA: Insufficient documentation

## 2024-08-05 DIAGNOSIS — I5189 Other ill-defined heart diseases: Secondary | ICD-10-CM | POA: Diagnosis present

## 2024-08-05 DIAGNOSIS — E782 Mixed hyperlipidemia: Secondary | ICD-10-CM | POA: Diagnosis not present

## 2024-08-05 NOTE — Progress Notes (Signed)
 08/05/2024 Sergio Griffin   Nov 27, 1950  [id number]  Primary Physician [place]rum[place], Sergio Deed, NP Primary Cardiologist: Sergio Griffin Sergio Griffin  HPI:  Sergio Griffin is a 73 y.o.  morbidly overweight divorced Caucasian male father of 2, grandfather of 1 grandchild who is a retired Scientist, clinical (histocompatibility and immunogenetics) in [place] . He is being seen to be established to change cardiologist at his request. He was referred by Sergio Griffin Griffin practitioner.  I last saw him in the office 07/17/2023.  His risk factors include treated hypertension and hyperlipidemia. His father apparently had a stent. He smoked remotely over 3 years ago. He apparently had multiple coronary interventions beginning in 2011 with subsequent stents in 2019 or 20. By history he has diastolic dysfunction. He does have a history of obstructive sleep apnea intolerant to CPAP and BiPAP. He complains of shortness of breath which I think is primarily related to his morbid obesity.  He had chronic back pain which has resolved.  Since I saw him last he was admitted to the hospital on 9//25 with unstable angina.  His enzymes were negative.  2D echo was normal.  He had cardiac catheterization by Sergio Griffin with right radial approach which was technically challenging because of tortuosity revealing an occluded mid LAD at the site of prior stenting with right to left collaterals are 95% proximal PDA stenosis which was stented with a 3 mm x 15 mm long resolute Onyx drug-eluting stent stent postdilated to 3.25 mm.  He was able to intubate the RCA with a AL 0.75 and used a guide extension.  He was discharged home on 9/H was 25 on aspirin  and prasugrel .  He has had GI bleeding in the past but none since his intervention.  He has also developed a diffuse rash for unclear reasons and was recently put on a Medrol Dosepak.   Current Meds  Medication Sig   acetaminophen  (TYLENOL ) 500 MG tablet Take 500-1,000 mg by mouth every 6 (six)  hours as needed for moderate pain (pain score 4-6) or headache.   aspirin  EC 81 MG tablet Take 1 tablet (81 mg total) by mouth at bedtime.   Carboxymethylcellul-Glycerin (LUBRICATING EYE DROPS OP) Place 1 drop into both eyes 3 (three) times daily as needed (dry eyes).   clotrimazole-betamethasone (LOTRISONE) cream Apply 1 Application topically 2 (two) times daily.   cycloSPORINE  (RESTASIS ) 0.05 % ophthalmic emulsion Place 1 drop into both eyes 2 (two) times daily.   hydrocortisone  cream 1 % Apply topically 2 (two) times daily as needed for itching.   JANUVIA 100 MG tablet Take 100 mg by mouth every morning.   KLAYESTA powder Apply 1 Application topically daily.   latanoprost  (XALATAN ) 0.005 % ophthalmic solution Place 1 drop into both eyes at bedtime.   losartan  (COZAAR ) 100 MG tablet Take 1 tablet by mouth at bedtime.   Lutein 40 MG CAPS Take 40 mg by mouth every morning.   methylPREDNISolone (MEDROL DOSEPAK) 4 MG TBPK tablet SMARTSIG:- Tablet(s) By Mouth -   metoprolol  succinate (TOPROL -XL) 25 MG 24 hr tablet Take 25 mg by mouth daily. Take one tablet (25mg ) by mouth every morning.   nitroGLYCERIN  (NITROSTAT ) 0.4 MG SL tablet Place 1 tablet (0.4 mg total) under the tongue every 5 (five) minutes x 3 doses as needed for chest pain.   Pediatric Multivit-Minerals-C (CHEWABLES MULTIVITAMIN PO) Take 1 tablet by mouth in the morning. Chew one tablet by mouth daily.  Adult multivitamin  prasugrel  (EFFIENT ) 10 MG TABS tablet Take 1 tablet (10 mg total) by mouth at bedtime. Resume 48 hours after surgery   torsemide (DEMADEX) 20 MG tablet Take 20 mg by mouth daily as needed (leg swelling/edema). Take one tablet (20mg ) by mouth daily as needed for leg swelling/fluid build up.  Take an additional tablet the next day if swelling does not decrease.   Vitamin A 3 MG CAPS Take 10,000 Units by mouth every morning.   Vitamin D, Ergocalciferol, (DRISDOL) 1.25 MG (50000 UNIT) CAPS capsule Take 50,000 Units by mouth  2 (two) times a week. Take one capsule by mouth  on Monday and Thursday.     Allergies  Allergen Reactions   Nsaids Other (See Comments)    Chest pain, headache, nausea, and diarrhea.   Tamsulosin  Other (See Comments)    Light headed and pass out   Fluconazole Other (See Comments)    Severe case of diarrhea, headache, gastric distress   Ciprofloxacin  Itching   Propoxyphene Itching    Darvocet   Sulfa Antibiotics Itching    Social History   Socioeconomic History   Marital status: Divorced    Spouse name: Not on file   Number of children: 2   Years of education: 16   Highest education level: Not on file  Occupational History   Occupation: Academic librarian    Comment: Retired  Tobacco Use   Smoking status: Former    Types: Cigarettes   Smokeless tobacco: Never   Tobacco comments:    quit 1987  Vaping Use   Vaping status: Never Used  Substance and Sexual Activity   Alcohol  use: No   Drug use: No   Sexual activity: Not Currently    Partners: Female  Other Topics Concern   Not on file  Social History Narrative   Fun: Paint, cook, travel   Denies religious beliefs effecting health care.    Social Drivers of Corporate investment banker Strain: Not on file  Food Insecurity: No Food Insecurity (07/24/2024)   Hunger Vital Sign    Worried About Running Out of Food in the Last Year: Never true    Ran Out of Food in the Last Year: Never true  Transportation Needs: No Transportation Needs (07/24/2024)   PRAPARE - Administrator, Civil Service (Medical): No    Lack of Transportation (Non-Medical): No  Physical Activity: Not on file  Stress: Not on file  Social Connections: Moderately Integrated (07/24/2024)   Social Connection and Isolation Panel    Frequency of Communication with Friends and Family: More than three times a week    Frequency of Social Gatherings with Friends and Family: More than three times a week    Attends Religious Services: More than 4  times per year    Active Member of [employer] or Organizations: Yes    Attends Engineer, structural: More than 4 times per year    Marital Status: Divorced  Intimate Partner Violence: Not At Risk (07/24/2024)   Humiliation, Afraid, Rape, and Kick questionnaire    Fear of Current or Ex-Partner: No    Emotionally Abused: No    Physically Abused: No    Sexually Abused: No     Review of Systems: General: negative for chills, fever, night sweats or weight changes.  Cardiovascular: negative for chest pain, dyspnea on exertion, edema, orthopnea, palpitations, paroxysmal nocturnal dyspnea or shortness of breath Dermatological: negative for rash Respiratory: negative for cough or wheezing Urologic: negative  for hematuria Abdominal: negative for nausea, vomiting, diarrhea, bright red blood per rectum, melena, or hematemesis Neurologic: negative for visual changes, syncope, or dizziness All other systems reviewed and are otherwise negative except as noted above.    Blood pressure (!) 154/84, pulse 62, height 5' 11 (1.803 m), weight 295 lb (133.8 kg), SpO2 94%.  General appearance: alert and no distress Neck: no adenopathy, no carotid bruit, no JVD, supple, symmetrical, trachea midline, and thyroid  not enlarged, symmetric, no tenderness/mass/nodules Lungs: clear to auscultation bilaterally Heart: regular rate and rhythm, S1, S2 normal, no murmur, click, rub or gallop Extremities: extremities normal, atraumatic, no cyanosis or edema Pulses: 2+ and symmetric Skin: Skin color, texture, turgor normal. No rashes or lesions Neurologic: Grossly normal  EKG not performed today      ASSESSMENT AND PLAN:   CAD (coronary artery disease), native coronary artery History of CAD status post multiple interventions in the past beginning in 2011 with subsequent stents in 2019 and 2020.  He recently presented with chest pain and had negative troponins.  He underwent cardiac catheterization by Sergio Griffin  via the right radial approach which was technically challenging because of tortuosity requiring guide extension.  This revealed an occluded mid LAD with right to left collaterals and 95% proximal PDA stenosis which was stented using a 3 mm x 15 mm long resolute Onyx drug-eluting stent postdilated to 3.25 mm.  He was able to cannulate this with a AL 0.75 guide catheter and a guide extension.  He is on aspirin  and prasugrel .  He has had no recurrent chest pain.  OSA (obstructive sleep apnea) History of obstructive sleep apnea intolerant to CPAP and BiPAP.  Hyperlipidemia History of hyperlipidemia no longer on statin therapy with recent lipid profile performed 05/28/2024 by his PCP revealing a total cholesterol 173, LDL 107 and HDL 48.  Will refer him to a Pharm.D. to discuss PCSK9.  LDL goal less than 55.  Diastolic dysfunction Normal LV systolic function with normal diastolic function by 2D echo 07/24/2024.  He is on torsemide.  Morbid obesity (HCC) Morbid obesity with a BMI of 41.  He has lost 27 pounds since I saw him in the office a year ago as result of diet and exercise.     Sergio DOROTHA Lesches Griffin FACP,FACC,FAHA, [facility] 08/05/2024 4:13 PM

## 2024-08-05 NOTE — Assessment & Plan Note (Signed)
 History of obstructive sleep apnea intolerant to CPAP and BiPAP.

## 2024-08-05 NOTE — Assessment & Plan Note (Signed)
 History of hyperlipidemia no longer on statin therapy with recent lipid profile performed 05/28/2024 by his PCP revealing a total cholesterol 173, LDL 107 and HDL 48.  Will refer him to a Pharm.D. to discuss PCSK9.  LDL goal less than 55.

## 2024-08-05 NOTE — Telephone Encounter (Signed)
 Called and spoke with pt. He reports he feels like he is getting choked on just about everything. Pt was recently admitted (07/23/2024) to hospital with chest pain, and followed up with cardiology with a heart cath. Advised pt that we would need to schedule an ov prior to scheduling the endoscopy to ensure pt was stable for procedure. Appt scheduled for 08/18/24 at 1020 with Jessica. Pt instructed to eat soft foods, and to chew thoroughly before swallowing.

## 2024-08-05 NOTE — Assessment & Plan Note (Signed)
 History of CAD status post multiple interventions in the past beginning in 2011 with subsequent stents in 2019 and 2020.  He recently presented with chest pain and had negative troponins.  He underwent cardiac catheterization by Dr.Thukkani via the right radial approach which was technically challenging because of tortuosity requiring guide extension.  This revealed an occluded mid LAD with right to left collaterals and 95% proximal PDA stenosis which was stented using a 3 mm x 15 mm long resolute Onyx drug-eluting stent postdilated to 3.25 mm.  He was able to cannulate this with a AL 0.75 guide catheter and a guide extension.  He is on aspirin  and prasugrel .  He has had no recurrent chest pain.

## 2024-08-05 NOTE — Assessment & Plan Note (Signed)
 Morbid obesity with a BMI of 41.  He has lost 27 pounds since I saw him in the office a year ago as result of diet and exercise.

## 2024-08-05 NOTE — Assessment & Plan Note (Signed)
 Normal LV systolic function with normal diastolic function by 2D echo 07/24/2024.  He is on torsemide.

## 2024-08-05 NOTE — Patient Instructions (Signed)
 Medication Instructions:  Your physician recommends that you continue on your current medications as directed. Please refer to the Current Medication list given to you today.  *If you need a refill on your cardiac medications before your next appointment, please call your pharmacy*   Follow-Up: At [facility], you and your health needs are our priority.  As part of our continuing mission to provide you with exceptional heart care, our providers are all part of one team.  This team includes your primary Cardiologist (physician) and Advanced Practice Providers or APPs (Physician Assistants and Nurse Practitioners) who all work together to provide you with the care you need, when you need it.  Your next appointment:   3 month(s)  Provider:   Jon Hails, PA-C, Callie Goodrich, PA-C, Kathleen Johnson, PA-C, Hao Meng, PA-C, Damien Braver, NP, or Katlyn West, NP         Then, Dorn Lesches, MD will plan to see you again in 6 month(s).    We recommend signing up for the patient portal called MyChart.  Sign up information is provided on this After Visit Summary.  MyChart is used to connect with patients for Virtual Visits (Telemedicine).  Patients are able to view lab/test results, encounter notes, upcoming appointments, etc.  Non-urgent messages can be sent to your provider as well.   To learn more about what you can do with MyChart, go to [email or web address].   Other Instructions We will schedule you an appointment to see a PharmD to further discuss cholesterol therapy.

## 2024-08-10 ENCOUNTER — Telehealth: Payer: Self-pay | Admitting: Gastroenterology

## 2024-08-10 NOTE — Telephone Encounter (Signed)
 Inbound call from patient stating he has started bleeding through his rectum again. Started this morning. Patient requesting a call back from nurse  Please advise  Thank you

## 2024-08-10 NOTE — Telephone Encounter (Signed)
 The pt began to have BRBPR at 3 am- has a history of diverticulitis.  He has some cramping in the lower abd. Feels pressure in the rectum.  He states that he has passed some clots with fluid but no real feces.  He states this it the 3rd or 4 th time since May that this has happened.  He would like to know if he can have abx called in.

## 2024-08-12 ENCOUNTER — Telehealth: Payer: Self-pay | Admitting: Gastroenterology

## 2024-08-12 MED ORDER — AMOXICILLIN-POT CLAVULANATE 875-125 MG PO TABS: 1.0000 | ORAL_TABLET | Freq: Two times a day (BID) | ORAL | 0 refills | Status: AC

## 2024-08-12 MED ORDER — HYOSCYAMINE SULFATE 0.125 MG SL SUBL: 0.1250 mg | SUBLINGUAL_TABLET | Freq: Every day | SUBLINGUAL | 1 refills | Status: AC | PRN

## 2024-08-12 NOTE — Telephone Encounter (Signed)
 Called patient, he is having mild left lower quadrant discomfort and is passing small amount of blood clots, feels it is decreasing.  He is on Effient  after recent cardiac stent placement.  His symptoms feel similar to the one he had before with acute diverticulitis He has history of recurrent sigmoid diverticulitis, please send prescription for Augmentin  875 mg twice daily for 7 days Complains of worsening dysphagia, especially worse with any hot or cold liquids, he has history of esophageal spasms.  Advised him to start meal with warm soup and avoid any hot or cold liquids.  Last EGD in 2021, will consider repeat EGD to exclude esophageal stricture or erosive esophagitis.  He has a follow-up appointment with Harlene on September 30, encouraged him to keep the appointment. Please send prescription for Levsin  sublingual 0.125 tablet daily as needed, Rx for 30 days with 1 refill.  Thank you.

## 2024-08-12 NOTE — Telephone Encounter (Signed)
 Received a call from patient stating he has been waiting since Monday for a nurse to contact him regarding his bleeding symptoms, He is still experiencing bleeding. Please review and advise  Thank you

## 2024-08-12 NOTE — Telephone Encounter (Signed)
Follow up in separate encounter

## 2024-08-12 NOTE — Telephone Encounter (Signed)
 Orders placed.

## 2024-08-14 ENCOUNTER — Telehealth (HOSPITAL_BASED_OUTPATIENT_CLINIC_OR_DEPARTMENT_OTHER): Payer: Self-pay | Admitting: *Deleted

## 2024-08-14 NOTE — Telephone Encounter (Signed)
   Pre-operative Risk Assessment    Patient Name: Sergio Griffin  DOB: [birth year] MRN: [id number]   Date of last office visit: 08/05/24 DR. BERRY Date of next office visit: 11/04/24 Sergio MOSE, NP  DR. HESSLER SAID PT IS ALREADY ON ABX FOR ABSCESS; WOULD LIKE TO EXTRACT TOOTH TODAY OF POSSIBLE. RECENT CATH 07/24/24  Request for Surgical Clearance    Procedure:  Dental Extraction - Amount of Teeth to be Pulled:  1 MOLAR THAT IS ABSCESSED TO BE EXTRACTED; PLAN IS FOR SIMPLE EXTRACTION PER DR. PRENTICE Griffin, DDS   Date of Surgery:  Clearance 08/14/24                                 Surgeon: DR. PRENTICE Griffin, DDS  Surgeon's Group or Practice Name:  [facility]  Phone number:  CALL THE PT'S CELL # [phone] Fax number:  [phone]   Type of Clearance Requested:   - Medical  - Pharmacy:  Hold Aspirin  and Prasugrel  (Effient )     Type of Anesthesia:  Local    Additional requests/questions:    Sergio Griffin   08/14/2024, 3:27 PM

## 2024-08-14 NOTE — Telephone Encounter (Signed)
   Patient Name: Sergio Griffin  DOB: 1951/06/28 MRN: [id number]  Primary Cardiologist: Dorn Lesches, MD  Chart reviewed as part of pre-operative protocol coverage.   IF SIMPLE EXTRACTION/CLEANINGS: Simple dental extractions (i.e. 1-2 teeth) are considered low risk procedures per guidelines and generally do not require any specific cardiac clearance. It is also generally accepted that for simple extractions and dental cleanings, there is no need to interrupt blood thinner therapy.  I will route this recommendation to the requesting party via Epic fax function and remove from pre-op pool.  Please call with questions.  Lamarr Satterfield, NP 08/14/2024, 3:43 PM

## 2024-08-18 ENCOUNTER — Encounter: Payer: Self-pay | Admitting: Gastroenterology

## 2024-08-18 ENCOUNTER — Ambulatory Visit (INDEPENDENT_AMBULATORY_CARE_PROVIDER_SITE_OTHER): Admitting: Gastroenterology

## 2024-08-18 ENCOUNTER — Telehealth: Payer: Self-pay | Admitting: Gastroenterology

## 2024-08-18 VITALS — BP 110/60 | HR 75 | Ht 71.0 in | Wt 298.4 lb

## 2024-08-18 DIAGNOSIS — Z860101 Personal history of adenomatous and serrated colon polyps: Secondary | ICD-10-CM

## 2024-08-18 DIAGNOSIS — K625 Hemorrhage of anus and rectum: Secondary | ICD-10-CM | POA: Diagnosis not present

## 2024-08-18 DIAGNOSIS — R1310 Dysphagia, unspecified: Secondary | ICD-10-CM

## 2024-08-18 MED ORDER — HYDROCORTISONE ACETATE 25 MG RE SUPP: 25.0000 mg | Freq: Two times a day (BID) | RECTAL | 2 refills | Status: AC

## 2024-08-18 NOTE — Telephone Encounter (Signed)
 Inbound call from [facility] returning phone call to [facility]. Needs to speak with Vonda. Please advise, thank you

## 2024-08-18 NOTE — Patient Instructions (Signed)
 We have sent the following medications to your pharmacy for you to pick up at your convenience: Hydrocortisone  suppository twice daily.   You have been scheduled for a Barium Esophogram at [facility] (1st floor of the hospital) on Wednesday 08/26/24 at 10 am. Please arrive 30 minutes prior to your appointment for registration. Make certain not to have anything to eat or drink 3 hours prior to your test. If you need to reschedule for any reason, please contact radiology at [phone] to do so. __________________________________________________________________ A barium swallow is an examination that concentrates on views of the esophagus. This tends to be a double contrast exam (barium and two liquids which, when combined, create a gas to distend the wall of the oesophagus) or single contrast (non-ionic iodine based). The study is usually tailored to your symptoms so a good history is essential. Attention is paid during the study to the form, structure and configuration of the esophagus, looking for functional disorders (such as aspiration, dysphagia, achalasia, motility and reflux) EXAMINATION You may be asked to change into a gown, depending on the type of swallow being performed. A radiologist and radiographer will perform the procedure. The radiologist will advise you of the type of contrast selected for your procedure and direct you during the exam. You will be asked to stand, sit or lie in several different positions and to hold a small amount of fluid in your mouth before being asked to swallow while the imaging is performed .In some instances you may be asked to swallow barium coated marshmallows to assess the motility of a solid food bolus. The exam can be recorded as a digital or video fluoroscopy procedure. POST PROCEDURE It will take 1-2 days for the barium to pass through your system. To facilitate this, it is important, unless otherwise directed, to increase your fluids for the  next 24-48hrs and to resume your normal diet.  This test typically takes about 30 minutes to perform. __________________________________________________________________________________

## 2024-08-18 NOTE — Progress Notes (Signed)
 08/18/2024 Sergio Griffin [id number] July 31, 1951   HISTORY OF PRESENT ILLNESS:  This is a 73 y.o. male with medical history significant for coronary artery disease and history of cardiac stents in 2011, 2019, 2020, and 07/2024.  He has history of type 2 diabetes mellitus, hypertension, hyperlipidemia.  He recently has experienced some rectal bleeding.  He was seen by our service in the hospital earlier this month.  CTA was negative for bleeding source.  Bleeding thought to be diverticular or hemorrhoidal has he had both of those on recent colonoscopy as listed below.  He was treated with some hydrocortisone  suppositories.  Bleeding resolved.  From a cardiac standpoint he underwent cardiac cath for complaints of chest pain and had one stent placed.  He is on Effient .  He called last week and was complaining of some recurrent bleeding and some left lower quadrant abdominal pain.  Was placed on Augmentin  twice daily for 7 days and then had some dental work done for which they also prescribed some Augmentin .  He is here today for follow-up of the above, but also has complaints of dysphagia.  Complaining of both solid foods and liquids having a hard time going down.  This seems to happen intermittently.  He is not on any PPI therapy.  Will use occasional Maalox as needed.  EGD 10/2020:  - No endoscopic esophageal abnormality to explain patient' s dysphagia. Esophagus dilated. Dilated. - Z- line regular, 40 cm from the incisors. - Gastritis. Biopsied. - Normal examined duodenum  1. Surgical [P], gastric antrum, gastric body - GASTRIC ANTRAL MUCOSA WITH MILD REACTIVE GASTROPATHY. - GASTRIC OXYNTIC MUCOSA WITH MILD CHRONIC GASTRITIS. - WARTHIN-STARRY STAIN IS NEGATIVE FOR HELICOBACTER PYLORI.   Colonoscopy 05/2024:   - Two 4 to 6 mm polyps in the transverse colon and in the ascending colon, removed with a cold snare. Resected and retrieved. - Severe diverticulosis in the sigmoid colon and in the  descending colon. There was narrowing of the colon in association with the diverticular opening. There was evidence of diverticular spasm. Peri- diverticular erythema was seen. There was evidence of an impacted diverticulum. - Non- bleeding external and internal hemorrhoids.  1. Surgical [P], colon, ascending and transverse, polyp (2) :       -  TUBULAR ADENOMA (2 OF 2 FRAGMENTS)       -  NO HIGH-GRADE DYSPLASIA OR MALIGNANCY IDENTIFIED   Past Medical History:  Diagnosis Date   Anxiety    Barrett's esophagus    CAD (coronary artery disease)    PCI, stent 2011   Cardiac arrhythmia    Chicken pox    Depression    Diabetes mellitus without complication (HCC)    Diverticulitis    Diverticulosis    Gastric ulcer    GERD (gastroesophageal reflux disease)    Glaucoma    Heart attack (HCC) 01/29/2019   History of myocardial infarction    Hyperlipidemia    Hypertension    Kidney stones    Kidney stones    Pneumonia 11/2014   Prostatitis    Reflux    Sleep apnea    UTI (lower urinary tract infection)    Past Surgical History:  Procedure Laterality Date   cardiac stents     last one 01/29/2019   CORONARY ANGIOGRAPHY N/A 07/24/2024   Procedure: CORONARY ANGIOGRAPHY;  Surgeon: Wendel Lurena POUR, MD;  Location: [facility];  Service: Cardiovascular;  Laterality: N/A;   CORONARY STENT INTERVENTION N/A 07/24/2024  Procedure: CORONARY STENT INTERVENTION;  Surgeon: Thukkani, Arun K, MD;  Location: [facility];  Service: Cardiovascular;  Laterality: N/A;   CYSTOSCOPY/URETEROSCOPY/HOLMIUM LASER/STENT PLACEMENT Left 10/22/2019   Procedure: CYSTOSCOPY/URETEROSCOPY/HOLMIUM LASER/STENT PLACEMENT;  Surgeon: Cam Morene ORN, MD;  Location: [facility];  Service: Urology;  Laterality: Left;   ESOPHAGEAL MANOMETRY N/A 09/11/2017   Procedure: ESOPHAGEAL MANOMETRY (EM);  Surgeon: Shila Gustav GAILS, MD;  Location: [facility];  Service: Endoscopy;  Laterality: N/A;   EYE SURGERY Left    x 4    EYE SURGERY Right    x 3    reports that he has quit smoking. His smoking use included cigarettes. He has never used smokeless tobacco. He reports that he does not drink alcohol  and does not use drugs. family history includes Arthritis in his mother; Congestive Heart Failure in his brother; Diabetes in his maternal grandmother; Heart disease in his father, maternal grandfather, maternal grandmother, paternal grandfather, and paternal grandmother; Hyperlipidemia in his father, maternal grandfather, maternal grandmother, and mother; Hypertension in his father, maternal grandfather, maternal grandmother, mother, and paternal grandfather; Non-Hodgkin's lymphoma in his father; Ovarian cancer in his mother; Stroke in his maternal grandmother, mother, and paternal grandfather. Allergies  Allergen Reactions   Nsaids Other (See Comments)    Chest pain, headache, nausea, and diarrhea.   Tamsulosin  Other (See Comments)    Light headed and pass out   Fluconazole Other (See Comments)    Severe case of diarrhea, headache, gastric distress   Ciprofloxacin  Itching   Propoxyphene Itching    Darvocet   Sulfa Antibiotics Itching      Outpatient Encounter Medications as of 08/18/2024  Medication Sig   acetaminophen  (TYLENOL ) 500 MG tablet Take 500-1,000 mg by mouth every 6 (six) hours as needed for moderate pain (pain score 4-6) or headache.   amoxicillin -clavulanate (AUGMENTIN ) 875-125 MG tablet Take 1 tablet by mouth 2 (two) times daily for 7 days.   aspirin  EC 81 MG tablet Take 1 tablet (81 mg total) by mouth at bedtime.   Carboxymethylcellul-Glycerin (LUBRICATING EYE DROPS OP) Place 1 drop into both eyes 3 (three) times daily as needed (dry eyes).   clotrimazole-betamethasone (LOTRISONE) cream Apply 1 Application topically 2 (two) times daily.   cycloSPORINE  (RESTASIS ) 0.05 % ophthalmic emulsion Place 1 drop into both eyes 2 (two) times daily.   hydrocortisone  (ANUSOL -HC) 25 MG suppository Place 1  suppository (25 mg total) rectally 2 (two) times daily.   hydrocortisone  cream 1 % Apply topically 2 (two) times daily as needed for itching.   hyoscyamine  (LEVSIN  SL) 0.125 MG SL tablet Place 1 tablet (0.125 mg total) under the tongue daily as needed for cramping.   JANUVIA 100 MG tablet Take 100 mg by mouth every morning.   KLAYESTA powder Apply 1 Application topically daily.   latanoprost  (XALATAN ) 0.005 % ophthalmic solution Place 1 drop into both eyes at bedtime.   losartan  (COZAAR ) 100 MG tablet Take 1 tablet by mouth at bedtime.   Lutein 40 MG CAPS Take 40 mg by mouth every morning.   methylPREDNISolone (MEDROL DOSEPAK) 4 MG TBPK tablet SMARTSIG:- Tablet(s) By Mouth -   metoprolol  succinate (TOPROL -XL) 25 MG 24 hr tablet Take 25 mg by mouth daily. Take one tablet (25mg ) by mouth every morning.   nitroGLYCERIN  (NITROSTAT ) 0.4 MG SL tablet Place 1 tablet (0.4 mg total) under the tongue every 5 (five) minutes x 3 doses as needed for chest pain.   Pediatric Multivit-Minerals-C (CHEWABLES MULTIVITAMIN PO) Take 1 tablet  by mouth in the morning. Chew one tablet by mouth daily.  Adult multivitamin   prasugrel  (EFFIENT ) 10 MG TABS tablet Take 1 tablet (10 mg total) by mouth at bedtime. Resume 48 hours after surgery   silodosin (RAPAFLO) 4 MG CAPS capsule Take 4 mg by mouth at bedtime.   torsemide (DEMADEX) 20 MG tablet Take 20 mg by mouth daily as needed (leg swelling/edema). Take one tablet (20mg ) by mouth daily as needed for leg swelling/fluid build up.  Take an additional tablet the next day if swelling does not decrease.   Vitamin A 3 MG CAPS Take 10,000 Units by mouth every morning.   Vitamin D, Ergocalciferol, (DRISDOL) 1.25 MG (50000 UNIT) CAPS capsule Take 50,000 Units by mouth 2 (two) times a week. Take one capsule by mouth  on Monday and Thursday.   No facility-administered encounter medications on file as of 08/18/2024.    REVIEW OF SYSTEMS  : All other systems reviewed and negative  except where noted in the History of Present Illness.   PHYSICAL EXAM: BP 110/60 (BP Location: Right Arm, Patient Position: Sitting, Cuff Size: Normal)   Pulse 75   Ht 5' 11 (1.803 m)   Wt 298 lb 6 oz (135.3 kg)   BMI 41.61 kg/m  General: Well developed white male in no acute distress Head: Normocephalic and atraumatic Eyes:  Sclerae anicteric, conjunctiva pink. Ears: Normal auditory acuity Lungs: Clear throughout to auscultation; no W/R/R. Heart: Regular rate and rhythm; no M/R/G. Abdomen: Soft, non-distended.  BS present.  Some lower abdominal TTP especially in the LLQ. Musculoskeletal: Symmetrical with no gross deformities  Skin: No lesions on visible extremities Extremities: No edema  Neurological: Alert oriented x 4, grossly non-focal Psychological:  Alert and cooperative. Normal mood and affect  ASSESSMENT AND PLAN: *Dysphagia: Reporting dysphagia to both solids and liquids just recently.  Had dysphagia symptoms in the past with history of EGDs with dilation and esophageal manometry, etc.  Last EGD in 2021 showed no cause of dysphagia, but esophagus was dilated.  He just had a cardiac cath and a stent placed.  Unlikely that we will be able to hold his Effient  anytime in the near future for EGD.  He may have motility disorder.  I am going to order an esophagram to start with. *Rectal bleeding: Thought to be either diverticular or possibly hemorrhoidal as he had both of these present on his colonoscopy just in July.  Recent CTA negative for bleeding source.  This is in the setting of antiplatelet use with Effient .  Was treated with hydrocortisone  suppositories in the hospital.  Will prescribe hydrocortisone  suppositories for him to use intermittently as needed at home.  Prescription sent to pharmacy. *LLQ abdominal pain: Was placed on antibiotics in the form of Augmentin  last week for possible diverticulitis.  He will complete that course. *Cardiac cath with stent placed earlier this  month, on Effient .  CC:  Crumpton, Stephanie Pru*

## 2024-08-26 ENCOUNTER — Inpatient Hospital Stay (HOSPITAL_COMMUNITY): Admission: RE | Admit: 2024-08-26 | Source: Ambulatory Visit

## 2024-08-27 NOTE — Telephone Encounter (Signed)
 Left message for PCP to call office.

## 2024-08-27 NOTE — Telephone Encounter (Signed)
 Spoke with Vonda, at [facility] and patient HgB was 10.7 on 08/12/24. She will fax labs to our office.

## 2024-08-27 NOTE — Telephone Encounter (Signed)
 Spoke with patient and he is currently in the ER with cellulitis. He will call tomorrow if they do blood test or not.

## 2024-08-31 ENCOUNTER — Telehealth: Payer: Self-pay | Admitting: Gastroenterology

## 2024-08-31 NOTE — Telephone Encounter (Signed)
 Inbound call from patient requesting a call to discuss lab results that were requested. Please advise, thank you

## 2024-08-31 NOTE — Telephone Encounter (Signed)
 Spoke with the patient. He was in the hospital in [place]. Labs were done. He reports a hgb of 10 something and an abnormal WBC.  Hospital telephone number is [phone]. He wants records sent here and was not able to get that to happen without going back to the hospital. Asks for our help. Wants to reschedule the missed appointment for the DG esophagus.  Message sent to the radiology scheduling dept to contact the patient.

## 2024-09-01 ENCOUNTER — Other Ambulatory Visit: Payer: Self-pay

## 2024-09-01 NOTE — Telephone Encounter (Signed)
 Lab report requested from [facility] in [place]

## 2024-09-02 ENCOUNTER — Ambulatory Visit: Payer: Self-pay | Admitting: Gastroenterology

## 2024-09-02 ENCOUNTER — Ambulatory Visit (HOSPITAL_COMMUNITY)
Admission: RE | Admit: 2024-09-02 | Discharge: 2024-09-02 | Disposition: A | Discharge status: Home / Self Care | Source: Ambulatory Visit | Attending: Gastroenterology | Admitting: Gastroenterology

## 2024-09-02 DIAGNOSIS — R1310 Dysphagia, unspecified: Secondary | ICD-10-CM | POA: Diagnosis present

## 2024-09-03 ENCOUNTER — Telehealth: Payer: Self-pay

## 2024-09-03 NOTE — Telephone Encounter (Signed)
Inbound call from patient requesting a call back from the nurse. Please advise.

## 2024-09-03 NOTE — Telephone Encounter (Signed)
 [facility] Pre-operative Risk Assessment     Request for surgical clearance:     Endoscopy Procedure  What type of surgery is being performed?     EGD with dilation  When is this surgery scheduled?     To be decided  What type of clearance is required ?   Pharmacy  Are there any medications that need to be held prior to surgery and how long? Effient   Practice name and name of physician performing surgery?      [facility] Dr Gustav Mcgee, MD  What is your office phone and fax number?      Phone- [phone]  Fax- [phone]  Anesthesia type (None, local, MAC, general) ?       MAC   Please route your response to Almarie Goo, LPN

## 2024-09-04 NOTE — Telephone Encounter (Signed)
 Pharmacy please advise on holding Effient  prior to EGD with dilation  scheduled for TBD.Last labs (CBC 07/27/2024) and (BMET 07/26/2024). Thank you.

## 2024-09-07 ENCOUNTER — Ambulatory Visit: Admitting: Pharmacist

## 2024-09-11 NOTE — Telephone Encounter (Signed)
 Unable to schedule EGD until we receive the guidance regarding patient anti-coagulation therapy.

## 2024-09-11 NOTE — Telephone Encounter (Signed)
 Dr. Court,  You saw this patient on 08/05/2024. Per protocol we request that you comment on his cardiac risk to proceed with EGD with dilation on  TBD, and to hold Effient .  He was recently placed back on Effient . Per Dr.Thukkani's cath note from 07/24/2024:  The patient's chronic prasugrel  was reinitiated. Given the patient's history of bright red blood per rectum which should seems to have resolved, resolute stent was chosen given its indication for 1 month of DAPT if needed. Otherwise would recommend 6 to 12 months of dual antiplatelet therapy with aspirin  and prasugrel  followed by prasugrel  monotherapy.    Would you please weigh in on this, since it has been less than 2 months since evaluated in the office. Please send your comment to P [facility] Pre-Op Pool.  Thank you, Lamarr Satterfield DNP, ANP, AACC.

## 2024-09-11 NOTE — Telephone Encounter (Signed)
 This note is for antiplatelet hold which we do not clear for

## 2024-09-14 ENCOUNTER — Ambulatory Visit: Admitting: Gastroenterology

## 2024-09-16 NOTE — Telephone Encounter (Signed)
   Name:  Sergio Griffin  DOB:  [birth year]  MRN:  [id number]   Primary Cardiologist: Dorn Lesches, MD  Chart reviewed as part of pre-operative protocol coverage. Patient was contacted 09/16/2024 in reference to pre-operative risk assessment for pending surgery as outlined below.  [name] was last seen on 08/05/2024 by Dr. Lesches.   Per Dr. Lesches 09/14/2024 I'm not comfortable with 1 month of DAPT given the complexity of the procedure. I would prefer at least 3-6 months prior to DAPT interruption.   JJB   Pre-op covering staff: - Please schedule appointment and call patient to inform them. If patient already had an upcoming appointment within acceptable timeframe, please add pre-op clearance to the appointment notes so provider is aware. - Please contact requesting surgeon's office via preferred method (i.e, phone, fax) to inform them of need for appointment prior to surgery.  Lamarr Satterfield, NP 09/16/2024, 7:38 AM

## 2024-09-30 ENCOUNTER — Ambulatory Visit: Payer: Self-pay | Admitting: Gastroenterology

## 2024-10-05 ENCOUNTER — Telehealth: Payer: Self-pay

## 2024-10-05 NOTE — Telephone Encounter (Signed)
 Fax request received for orders to cardiac rehab from [facility]. Multiple dates that been crossed through, this is the first attempt to our office that I am aware of. Cardiac rehab orders completed and signed. Faxed back to number provided. Confirmation of successful fax received.

## 2024-11-01 NOTE — Progress Notes (Unsigned)
 Cardiology Office Note    Date:  11/04/2024  ID:  Sergio Griffin, DOB 12-17-1950, MRN [id number] PCP:  Kristine Corean Deed, NP  Cardiologist:  Dorn Lesches, MD  Electrophysiologist:  None   Chief Complaint: Follow up for CAD   History of Present Illness: .   Sergio Griffin is a 73 y.o. male with visit-pertinent history of CAD s/p remote stenting, hypertension, hyperlipidemia, remote tobacco use, diastolic dysfunction, OSA with intolerance to CPAP and BiPAP.  Patient was first seen by Dr. Lesches in 06/2023 to establish care.  It was noted that he had multiple coronary inventions beginning in 2011 with subsequent stenting in 2019 or 2020, patient also remote history of diastolic dysfunction.  In 07/2024 patient was admitted with unstable angina, enzymes were negative.  2D echo was normal.  Patient underwent cardiac catheterization with Dr. Wendel with right radial approach which was technically challenging as a result of tortuosity revealing an occluded mid LAD at the site of prior stenting with right to left collaterals, 95% proximal PDA stenosis stented with a 3 mm x 15 mm long resolute Onyx DES.  It was recommended that he remain on aspirin  and Effient  for at least 6 to 12 months.  Patient was seen in clinic by Dr. Lesches on 08/05/2024.  He remained stable from a cardiac standpoint.  Today he presents for follow-up.  He reports that he has been doing well overall from a cardiac standpoint. He denies chest pain, notes occasional indigestion, has an esophageal stricture. Denies shortness of breath, increased lower extremity edema, orthopnea or pnd.  Patient notes that he has been having significant stressors in his life, notes difficulty with his car and has an esophageal stricture that cannot be fixed until March of next year.  He reports that he has been trying to regularly attend the gym, tolerates well.  He reports that his previously reported bleeding on his right shin has resolved,  denies any further bleeding.  ROS: .   Today he denies chest pain, shortness of breath, lower extremity edema, fatigue, palpitations, melena, hematuria, hemoptysis, diaphoresis, weakness, presyncope, syncope, orthopnea, and PND.  All other systems are reviewed and otherwise negative. Studies Reviewed: SABRA   EKG:  EKG is not ordered today.  CV Studies: Cardiac studies reviewed are outlined and summarized above. Otherwise please see EMR for full report. Cardiac Studies & Procedures   ______________________________________________________________________________________________ CARDIAC CATHETERIZATION  CARDIAC CATHETERIZATION 07/24/2024  Conclusion   Prox LAD to Mid LAD lesion is 50% stenosed.   Mid LAD lesion is 100% stenosed.   RPDA lesion is 95% stenosed.   A stent was successfully placed.   Post intervention, there is a 0% residual stenosis.  1.  Torturous right upper extremity requiring 85 cm destination sheath; even with this sheath coronary cannulation was challenging and required an EBU 3 5 guide and GuideLiner for selective coronary angiography of the left system and an AL 0.75 guide for angiography of the right system. 2.  Occluded mid LAD stents with mild in-stent restenosis of proximal LAD stent. 3.  95% PDA lesion treated with 3.0 x 15 mm resolute Onyx frontier stent postdilated to high pressures with a 3.25 Merino balloon.  This required complex PCI with a GuideLiner and multiple wires.  Summary: The patient's chronic prasugrel  was reinitiated.  Given the patient's history of bright red blood per rectum which should seems to have resolved, resolute stent was chosen given its indication for 1 month of DAPT if needed.  Otherwise would recommend 6 to 12 months of dual antiplatelet therapy with aspirin  and prasugrel  followed by prasugrel  monotherapy.  Findings Coronary Findings Diagnostic  Dominance: Right  Left Anterior Descending Collaterals Dist LAD filled by collaterals from  RPDA.  Prox LAD to Mid LAD lesion is 50% stenosed. The lesion was previously treated . Mid LAD lesion is 100% stenosed. The lesion was previously treated .  Ramus Intermedius There is mild diffuse disease throughout the vessel.  Left Circumflex  First Obtuse Marginal Branch There is mild disease in the vessel.  Second Obtuse Marginal Branch Vessel is small in size. There is moderate disease in the vessel.  Right Coronary Artery There is mild diffuse disease throughout the vessel.  Right Posterior Descending Artery RPDA lesion is 95% stenosed.  Intervention  RPDA lesion Stent A stent was successfully placed. Post-Intervention Lesion Assessment The intervention was successful. Pre-interventional TIMI flow is 3. Post-intervention TIMI flow is 3. There is a 0% residual stenosis post intervention.   STRESS TESTS  MYOCARDIAL PERFUSION IMAGING 02/28/2021   ECHOCARDIOGRAM  ECHOCARDIOGRAM COMPLETE 07/24/2024  Narrative ECHOCARDIOGRAM REPORT    Patient Name:   Sergio Griffin Date of Exam: 07/24/2024 Medical Rec #:  [id number]       Height:       71.0 in Accession #:    [id number]      Weight:       295.8 lb Date of Birth:  04/21/1951       BSA:          2.490 m Patient Age:    73 years        BP:           156/75 mmHg Patient Gender: M               HR:           64 bpm. Exam Location:  Inpatient  Procedure: 2D Echo, Cardiac Doppler and Color Doppler (Both Spectral and Color Flow Doppler were utilized during procedure).  Indications:    Chest Pain R07.9  History:        Patient has prior history of Echocardiogram examinations, most recent 10/28/2023. CAD and Previous Myocardial Infarction; Risk Factors:Hypertension, Diabetes and Sleep Apnea.  Sonographer:    Jayson Gaskins Referring Phys: [phone] CLAUDIA CLAIBORNE  IMPRESSIONS   1. Left ventricular ejection fraction, by estimation, is 60 to 65%. The left ventricle has normal function. The left ventricle has no regional  wall motion abnormalities. There is mild left ventricular hypertrophy. Left ventricular diastolic parameters were normal. 2. Right ventricular systolic function is normal. The right ventricular size is normal. 3. The mitral valve is grossly normal. Trivial mitral valve regurgitation. No evidence of mitral stenosis. 4. The aortic valve is tricuspid. Aortic valve regurgitation is not visualized. Aortic valve sclerosis is present, with no evidence of aortic valve stenosis.  Comparison(s): A prior study was performed on 10/28/2023. No prior Echocardiogram.  FINDINGS Left Ventricle: Left ventricular ejection fraction, by estimation, is 60 to 65%. The left ventricle has normal function. The left ventricle has no regional wall motion abnormalities. The left ventricular internal cavity size was normal in size. There is mild left ventricular hypertrophy. Left ventricular diastolic parameters were normal.  Right Ventricle: The right ventricular size is normal. No increase in right ventricular wall thickness. Right ventricular systolic function is normal.  Left Atrium: Left atrial size was normal in size.  Right Atrium: Right atrial size was normal in size.  Pericardium: There  is no evidence of pericardial effusion.  Mitral Valve: The mitral valve is grossly normal. Trivial mitral valve regurgitation. No evidence of mitral valve stenosis.  Tricuspid Valve: The tricuspid valve is grossly normal. Tricuspid valve regurgitation is not demonstrated. No evidence of tricuspid stenosis.  Aortic Valve: The aortic valve is tricuspid. Aortic valve regurgitation is not visualized. Aortic valve sclerosis is present, with no evidence of aortic valve stenosis. Aortic valve mean gradient measures 4.0 mmHg. Aortic valve peak gradient measures 8.5 mmHg. Aortic valve area, by VTI measures 2.38 cm.  Pulmonic Valve: The pulmonic valve was not well visualized. Pulmonic valve regurgitation is mild. No evidence of pulmonic  stenosis.  Aorta: The aortic root and ascending aorta are structurally normal, with no evidence of dilitation.  IAS/Shunts: The atrial septum is grossly normal.   LEFT VENTRICLE PLAX 2D LVIDd:         5.26 cm   Diastology LVIDs:         2.87 cm   LV e' medial:    5.77 cm/s LV PW:         1.27 cm   LV E/e' medial:  16.8 LV IVS:        1.28 cm   LV e' lateral:   9.57 cm/s LVOT diam:     2.04 cm   LV E/e' lateral: 10.1 LV SV:         77 LV SV Index:   31 LVOT Area:     3.27 cm   RIGHT VENTRICLE RV S prime:     13.70 cm/s TAPSE (M-mode): 2.5 cm  LEFT ATRIUM             Index        RIGHT ATRIUM           Index LA Vol (A2C):   46.9 ml 18.83 ml/m  RA Area:     22.50 cm LA Vol (A4C):   39.7 ml 15.94 ml/m  RA Volume:   68.10 ml  27.35 ml/m LA Biplane Vol: 46.0 ml 18.47 ml/m AORTIC VALVE AV Area (Vmax):    2.14 cm AV Area (Vmean):   2.59 cm AV Area (VTI):     2.38 cm AV Vmax:           146.00 cm/s AV Vmean:          91.500 cm/s AV VTI:            0.326 m AV Peak Grad:      8.5 mmHg AV Mean Grad:      4.0 mmHg LVOT Vmax:         95.80 cm/s LVOT Vmean:        72.600 cm/s LVOT VTI:          0.237 m LVOT/AV VTI ratio: 0.73  AORTA Ao Root diam: 3.43 cm Ao Asc diam:  3.60 cm  MITRAL VALVE MV Area (PHT): 4.04 cm    SHUNTS MV Decel Time: 188 msec    Systemic VTI:  0.24 m MV E velocity: 96.90 cm/s  Systemic Diam: 2.04 cm MV A velocity: 95.90 cm/s MV E/A ratio:  1.01  Sunit Tolia Electronically signed by Madonna Large Signature Date/Time: 07/24/2024/12:32:42 PM    Final          ______________________________________________________________________________________________       Current Reported Medications:.    Active Medications[1]  Physical Exam:    VS:  BP (!) 146/78   Pulse 77   Ht 5' 11 (  1.803 m)   Wt 289 lb 3.2 oz (131.2 kg)   SpO2 97%   BMI 40.34 kg/m    Wt Readings from Last 3 Encounters:  11/04/24 289 lb 3.2 oz (131.2 kg)  08/18/24 298  lb 6 oz (135.3 kg)  08/05/24 295 lb (133.8 kg)    GEN: Well nourished, well developed in no acute distress NECK: No JVD; No carotid bruits CARDIAC: RRR, no murmurs, rubs, gallops RESPIRATORY:  Clear to auscultation without rales, wheezing or rhonchi  ABDOMEN: Soft, non-tender, non-distended EXTREMITIES:  No edema; No acute deformity     Asessement and Plan:.    CAD: History of CAD s/p multiple interventions in the past beginning in 2011 with subsequent stenting in 2019 and 2020.  Patient underwent cardiac catheterization in 07/2024 in setting of unstable angina with negative troponins, revealed an occluded mid LAD with right to left collaterals and 95% proximal PDA stenosis stented using a 3 mm x 15 mm long resolute DES. Stable with no anginal symptoms. No indication for ischemic evaluation.  Heart healthy diet and regular cardiovascular exercise encouraged.  Reviewed ED precautions.  Continue aspirin  81 mg daily, losartan  100 mg daily, metoprolol  succinate 25 mg daily, Effient  10 mg daily and torsemide 20 mg daily as needed.  Patient's blood pressure has been elevated following hospital admission, will restart amlodipine  5 mg daily.  Hyperlipidemia: Patient with history of statin intolerance. Last lipid profile indicated LDL 107.  He will follow-up with Pharm.D. lipid clinic at the end of the month.  Diastolic dysfunction: Reported history of diastolic dysfunction.  Echo in 07/2024 indicated normal LV systolic function with normal diastolic function. Today he reports that he is doing well, denies shortness of breath, increased lower extremity edema, orthopnea or PND.  He appears euvolemic and well compensated on exam. Continue torsemide and losartan  100 mg daily.  Hypertension: Blood pressure today initially 150/76, on recheck was 146/78.  Will restart amlodipine  5 mg daily.  Continue losartan  100 mg daily and metoprolol  succinate 25 mg daily.  OSA: Unable to tolerate CPAP device.     Disposition: F/u with Dr. Court in 12/2024.   Signed, Jakai Onofre D Kionte Baumgardner, NP       [1]  Current Meds  Medication Sig   acetaminophen  (TYLENOL ) 500 MG tablet Take 500-1,000 mg by mouth every 6 (six) hours as needed for moderate pain (pain score 4-6) or headache.   amLODipine  (NORVASC ) 5 MG tablet Take 1 tablet (5 mg total) by mouth daily.   aspirin  EC 81 MG tablet Take 1 tablet (81 mg total) by mouth at bedtime.   Carboxymethylcellul-Glycerin (LUBRICATING EYE DROPS OP) Place 1 drop into both eyes 3 (three) times daily as needed (dry eyes).   clotrimazole-betamethasone (LOTRISONE) cream Apply 1 Application topically 2 (two) times daily.   cycloSPORINE  (RESTASIS ) 0.05 % ophthalmic emulsion Place 1 drop into both eyes 2 (two) times daily.   hydrocortisone  (ANUSOL -HC) 25 MG suppository Place 1 suppository (25 mg total) rectally 2 (two) times daily.   hydrocortisone  cream 1 % Apply topically 2 (two) times daily as needed for itching.   hyoscyamine  (LEVSIN  SL) 0.125 MG SL tablet Place 1 tablet (0.125 mg total) under the tongue daily as needed for cramping.   JANUVIA 100 MG tablet Take 100 mg by mouth every morning.   KLAYESTA powder Apply 1 Application topically daily.   latanoprost  (XALATAN ) 0.005 % ophthalmic solution Place 1 drop into both eyes at bedtime.   losartan  (COZAAR ) 100 MG tablet Take  1 tablet by mouth at bedtime.   Lutein 40 MG CAPS Take 40 mg by mouth every morning.   methylPREDNISolone (MEDROL DOSEPAK) 4 MG TBPK tablet SMARTSIG:- Tablet(s) By Mouth -   metoprolol  succinate (TOPROL -XL) 25 MG 24 hr tablet Take 25 mg by mouth daily. Take one tablet (25mg ) by mouth every morning.   nitroGLYCERIN  (NITROSTAT ) 0.4 MG SL tablet Place 1 tablet (0.4 mg total) under the tongue every 5 (five) minutes x 3 doses as needed for chest pain.   Pediatric Multivit-Minerals-C (CHEWABLES MULTIVITAMIN PO) Take 1 tablet by mouth in the morning. Chew one tablet by mouth daily.  Adult multivitamin   prasugrel   (EFFIENT ) 10 MG TABS tablet Take 1 tablet (10 mg total) by mouth at bedtime. Resume 48 hours after surgery   torsemide (DEMADEX) 20 MG tablet Take 20 mg by mouth daily as needed (leg swelling/edema). Take one tablet (20mg ) by mouth daily as needed for leg swelling/fluid build up.  Take an additional tablet the next day if swelling does not decrease.   Vitamin A 3 MG CAPS Take 10,000 Units by mouth every morning.   Vitamin D, Ergocalciferol, (DRISDOL) 1.25 MG (50000 UNIT) CAPS capsule Take 50,000 Units by mouth 2 (two) times a week. Take one capsule by mouth  on Monday and Thursday.

## 2024-11-03 ENCOUNTER — Telehealth: Payer: Self-pay | Admitting: Cardiovascular Disease

## 2024-11-03 NOTE — Telephone Encounter (Signed)
 He has been holding pressure on and off for the last 20 minutes and it is still trickling. Asked him if he has held it consistently for at least 5 minutes w/o stopping. He said he does not know for sure.  Informed him that I will speak with the Dr and call him back. Asked him to set an alarm on phone for 5 minutes and hold pressure. I will call him back.   He takes   prasugrel  (EFFIENT ) 10 MG TABS tablet   At bedtime.

## 2024-11-03 NOTE — Telephone Encounter (Signed)
 S/w Dr Okey- she reviewed the pt's information and the pt Cannot interrupt antiplatelet therapy. She recommends elevating leg- hold pressure, put a dressing on it, then wear compression socks over it to hold pressure. And to call us  tomorrow if it continues to trickle.  S/w the patient and gave the information above. (He reports that holding pressure for 5 minutes did help, until he dabbed it and knocked off the clot) He has an appointment tomorrow with Katlyn West, NP.  He verbalized understanding of all information and will come to appointment tomorrow.

## 2024-11-03 NOTE — Telephone Encounter (Signed)
 Pt stated he has area near his ankle the size of a eraser. He went to sleep and woke with a half cup of blood on the floor and could not get the area to stop bleeding. Pt states due to him taking a blood thinner does he need to stop please advise

## 2024-11-04 ENCOUNTER — Encounter: Payer: Self-pay | Admitting: Cardiology

## 2024-11-04 ENCOUNTER — Ambulatory Visit: Attending: Cardiovascular Disease | Admitting: Cardiology

## 2024-11-04 VITALS — BP 146/78 | HR 77 | Ht 71.0 in | Wt 289.2 lb

## 2024-11-04 DIAGNOSIS — I10 Essential (primary) hypertension: Secondary | ICD-10-CM | POA: Diagnosis not present

## 2024-11-04 DIAGNOSIS — I5189 Other ill-defined heart diseases: Secondary | ICD-10-CM | POA: Diagnosis present

## 2024-11-04 DIAGNOSIS — I2510 Atherosclerotic heart disease of native coronary artery without angina pectoris: Secondary | ICD-10-CM | POA: Insufficient documentation

## 2024-11-04 DIAGNOSIS — G4733 Obstructive sleep apnea (adult) (pediatric): Secondary | ICD-10-CM | POA: Diagnosis present

## 2024-11-04 DIAGNOSIS — E782 Mixed hyperlipidemia: Secondary | ICD-10-CM | POA: Insufficient documentation

## 2024-11-04 MED ORDER — AMLODIPINE BESYLATE 5 MG PO TABS: 5.0000 mg | ORAL_TABLET | Freq: Every day | ORAL | 3 refills | Status: DC

## 2024-11-04 NOTE — Patient Instructions (Signed)
 Medication Instructions:  Amlodipine  5mg  daily.   *If you need a refill on your cardiac medications before your next appointment, please call your pharmacy*  Lab Work: None  If you have labs (blood work) drawn today and your tests are completely normal, you will receive your results only by: MyChart Message (if you have MyChart) OR A paper copy in the mail If you have any lab test that is abnormal or we need to change your treatment, we will call you to review the results.  Testing/Procedures: None   Follow-Up: At [facility], you and your health needs are our priority.  As part of our continuing mission to provide you with exceptional heart care, our providers are all part of one team.  This team includes your primary Cardiologist (physician) and Advanced Practice Providers or APPs (Physician Assistants and Nurse Practitioners) who all work together to provide you with the care you need, when you need it.  Your next appointment:    February, 2026   Provider:   Dorn Lesches, MD  or Katlyn West, NP   We recommend signing up for the patient portal called MyChart.  Sign up information is provided on this After Visit Summary.  MyChart is used to connect with patients for Virtual Visits (Telemedicine).  Patients are able to view lab/test results, encounter notes, upcoming appointments, etc.  Non-urgent messages can be sent to your provider as well.   To learn more about what you can do with MyChart, go to [email or web address].   Other Instructions None

## 2024-11-09 ENCOUNTER — Telehealth: Payer: Self-pay | Admitting: Cardiology

## 2024-11-09 NOTE — Telephone Encounter (Signed)
 Pt c/o medication issue:  1. Name of Medication:   amLODipine  (NORVASC ) 5 MG tablet    2. How are you currently taking this medication (dosage and times per day)? As written   3. Are you having a reaction (difficulty breathing--STAT)? No   4. What is your medication issue? Pt states he was in the ED last night and he couldn't see the monitor but heard the monitor beeping and nurse told him his hr was below 50. He states this med was disconnected a few years ago due to this issue. He states he is nervous about taking this med. Please advise.

## 2024-11-09 NOTE — Telephone Encounter (Signed)
 Please tell Sergio Griffin to stop amlodipine .  He should continue to monitor his heart rate and blood pressure and bring a log to follow-up on 12/30 with pharmacy team.  I have spoken with the pharmacist that he will be seeing so she is aware he may need adjustments to blood pressure medications as well as cholesterol medications.  He needs to ensure that he follows up with his gastroenterologist and review ED precautions.  Thank you!

## 2024-11-09 NOTE — Telephone Encounter (Signed)
 I spoke with patient.  He reports he was started on amlodipine  recently.  Started amlodipine  this past Thursday.  He started having GI bleeding on Friday night. Reports this is the 5th time he has had bleeding since May. Sunday morning he felt weak and as if he would pass out.  Went to ED in [place], [place] and was diagnosed with diverticulitis and advised to follow up with GI. Today he has old blood in his stool but bleeding has not worsened.  He is reaching out to GI. No medication changes were made in ED.  Patient does not know what his heart rate and BP were in the ED but he could hear the monitor beeping. Was told his heart rate was below 50 as noted below.   Patient states amlodipine  was stopped in the past due to low heart rate.  I let patient know amlodipine  does not usually cause heart rate to become lower.  Patient thought he was told this in the past and does not want to take amlodipine  until he hears from provider.  He has not taken it today.  Patient does not routinely check BP and heart rate at home but does have BP cuff.  I asked him to start checking twice daily and contact office in 2 weeks with readings. Will forward to K. Devora, NP to see if OK for patient to continue amlodipine  and if other changes needed.

## 2024-11-09 NOTE — Telephone Encounter (Signed)
 Spoke with patient and shared message from Katlyn.  Amlodipine  removed from medication list. Patient states he called GI office today but could not get through, he states he will try again tomorrow. Patient verbalized understanding of instructions from Katlyn and expressed appreciation for follow-up.

## 2024-11-17 ENCOUNTER — Ambulatory Visit: Admitting: Pharmacist Clinician (PhC)/ Clinical Pharmacy Specialist

## 2024-11-17 NOTE — Progress Notes (Deleted)
 "  Office Visit    Patient Name: Sergio Griffin Date of Encounter: 11/17/2024  Primary Care Provider:  Kristine Corean Deed, NP Primary Cardiologist:  Dorn Lesches, MD  Chief Complaint    Hyperlipidemia   Significant Past Medical History   CAD multiple interventions beginning in 2011 with subsequent stenting in 2019 and 2020, most recently DEs to RPDA   HTN Elevated in office at last visit, added amlodipine  5 mg daily  OSA Unable to tolerate CPAP  DM2 5/25 A1c 6.9 on Januvia 100 mg daily        Allergies[1]  History of Present Illness    Sergio Griffin is a 73 y.o. male patient of Dr Lesches, in the office today to discuss options for cholesterol management.  Insurance Carrier: Public Affairs Consultant:     [employer]:      LDL Cholesterol goal:    Current Medications:     Previously tried:  atorvastatin , rosuvastatin , ezetimibe  Family Hx:     Social Hx: Tobacco: Alcohol :      Diet:      Exercise:   Adherence Assessment  Do you ever forget to take your medication? [] Yes [] No  Do you ever skip doses due to side effects? [] Yes [] No  Do you have trouble affording your medicines? [] Yes [] No  Are you ever unable to pick up your medication due to transportation difficulties? [] Yes [] No  Do you ever stop taking your medications because you don't believe they are helping? [] Yes [] No  Do you check your weight daily? [] Yes [] No   Adherence strategy: ***  Barriers to obtaining medications: ***     Accessory Clinical Findings  05/28/24 in [place] system:  TC 173, TG 89, HDL 48, LDL 107  Lab Results  Component Value Date   CHOL 114 06/11/2015   HDL 45 06/11/2015   LDLCALC 54 06/11/2015   TRIG 73 06/11/2015   CHOLHDL 2.5 06/11/2015    Lipoprotein (a)  Date/Time Value Ref Range Status  07/25/2024 05:06 AM 108.7 (H) <75.0 nmol/L Final    Comment:    (NOTE) This test was developed and its performance characteristics determined by [facility]. It has  not been cleared or approved by the Food and Drug Administration. Note:  Values greater than or equal to 75.0 nmol/L may       indicate an independent risk factor for CHD,       but must be evaluated with caution when applied       to non-Caucasian populations due to the       influence of genetic factors on Lp(a) across       ethnicities. Performed At: [facility] [address] [place], [place] [id number] Jennette Shorter MD Ey:[id number]     Lab Results  Component Value Date   ALT 44 07/23/2024   AST 30 07/23/2024   ALKPHOS 62 07/23/2024   BILITOT 1.0 07/23/2024   Lab Results  Component Value Date   CREATININE 1.20 07/26/2024   BUN 29 (H) 07/26/2024   NA 137 07/26/2024   K 4.5 07/26/2024   CL 99 07/26/2024   CO2 28 07/26/2024   Lab Results  Component Value Date   HGBA1C 6.9 (H) 03/30/2024    Home Medications    Current Outpatient Medications  Medication Sig Dispense Refill   acetaminophen  (TYLENOL ) 500 MG tablet Take 500-1,000 mg by mouth every 6 (six) hours as needed for moderate pain (pain score 4-6) or headache.  aspirin  EC 81 MG tablet Take 1 tablet (81 mg total) by mouth at bedtime.     Carboxymethylcellul-Glycerin (LUBRICATING EYE DROPS OP) Place 1 drop into both eyes 3 (three) times daily as needed (dry eyes).     clotrimazole-betamethasone (LOTRISONE) cream Apply 1 Application topically 2 (two) times daily.     cycloSPORINE  (RESTASIS ) 0.05 % ophthalmic emulsion Place 1 drop into both eyes 2 (two) times daily.     hydrocortisone  (ANUSOL -HC) 25 MG suppository Place 1 suppository (25 mg total) rectally 2 (two) times daily. 10 suppository 2   hydrocortisone  cream 1 % Apply topically 2 (two) times daily as needed for itching. 118 g 0   hyoscyamine  (LEVSIN  SL) 0.125 MG SL tablet Place 1 tablet (0.125 mg total) under the tongue daily as needed for cramping. 30 tablet 1   JANUVIA 100 MG tablet Take 100 mg by mouth every morning.     KLAYESTA powder Apply 1  Application topically daily.     latanoprost  (XALATAN ) 0.005 % ophthalmic solution Place 1 drop into both eyes at bedtime.     losartan  (COZAAR ) 100 MG tablet Take 1 tablet by mouth at bedtime.     Lutein 40 MG CAPS Take 40 mg by mouth every morning.     methylPREDNISolone (MEDROL DOSEPAK) 4 MG TBPK tablet SMARTSIG:- Tablet(s) By Mouth -     metoprolol  succinate (TOPROL -XL) 25 MG 24 hr tablet Take 25 mg by mouth daily. Take one tablet (25mg ) by mouth every morning.     nitroGLYCERIN  (NITROSTAT ) 0.4 MG SL tablet Place 1 tablet (0.4 mg total) under the tongue every 5 (five) minutes x 3 doses as needed for chest pain. 30 tablet 1   Pediatric Multivit-Minerals-C (CHEWABLES MULTIVITAMIN PO) Take 1 tablet by mouth in the morning. Chew one tablet by mouth daily.  Adult multivitamin     prasugrel  (EFFIENT ) 10 MG TABS tablet Take 1 tablet (10 mg total) by mouth at bedtime. Resume 48 hours after surgery     torsemide (DEMADEX) 20 MG tablet Take 20 mg by mouth daily as needed (leg swelling/edema). Take one tablet (20mg ) by mouth daily as needed for leg swelling/fluid build up.  Take an additional tablet the next day if swelling does not decrease.     Vitamin A 3 MG CAPS Take 10,000 Units by mouth every morning.     Vitamin D, Ergocalciferol, (DRISDOL) 1.25 MG (50000 UNIT) CAPS capsule Take 50,000 Units by mouth 2 (two) times a week. Take one capsule by mouth  on Monday and Thursday.     No current facility-administered medications for this visit.     Assessment & Plan    No problem-specific Assessment & Plan notes found for this encounter.   Allean Mink, PharmD CPP [facility] [address]   [place], [place] [address] [phone]  11/17/2024, 7:02 AM       [1]  Allergies Allergen Reactions   Nsaids Other (See Comments)    Chest pain, headache, nausea, and diarrhea.   Tamsulosin  Other (See Comments)    Light headed and pass out   Fluconazole Other (See Comments)    Severe case of diarrhea,  headache, gastric distress   Amlodipine      Causes low heart rate   Ciprofloxacin  Itching   Propoxyphene Itching    Darvocet   Sulfa Antibiotics Itching   "

## 2024-11-18 ENCOUNTER — Ambulatory Visit: Attending: Internal Medicine

## 2024-11-18 ENCOUNTER — Telehealth: Payer: Self-pay | Admitting: Pharmacy Technician

## 2024-11-18 ENCOUNTER — Telehealth: Payer: Self-pay

## 2024-11-18 ENCOUNTER — Other Ambulatory Visit (HOSPITAL_COMMUNITY): Payer: Self-pay

## 2024-11-18 DIAGNOSIS — E782 Mixed hyperlipidemia: Secondary | ICD-10-CM | POA: Diagnosis present

## 2024-11-18 MED ORDER — REPATHA SURECLICK 140 MG/ML ~~LOC~~ SOAJ: 140.0000 mg | SUBCUTANEOUS | 2 refills | Status: AC

## 2024-11-18 NOTE — Progress Notes (Signed)
 Patient ID: Sergio Griffin                 DOB: [birth year]                    MRN: [id number]      HPI: Sergio Griffin is a 73 y.o. male patient referred to lipid clinic by Dr. Court. PMH is significant for CAD multiple interventions beginning in 2011 with subsequent stenting in 2019 and 2020, most recently DEs to RPDA, HTN, OSA, and D2M.   Patient was evaluated by cardiology a few weeks ago for CAD follow-up. Due to statin intolerance and an LDL of 107 on the most recent lipid panel, the patient was referred to the PharmD lipid clinic.  Today, the patient presents in good spirits. Currently, the patient is not taking any medications for HLD management and hasn't been for a couple years now. He reports prior trials of atorvastatin  and rosuvastatin , which caused fatigue and memory issues, and recalls intolerance to ezetimibe but can't recall effect. He is mindful of his diet and exercises every other day. He has achieved significant weight loss through lifestyle changes, and his diabetes is diet-controlled; he is no longer on Januvia. Last A1c was 6.9% (May 2025).  We reviewed options for lowering LDL cholesterol, including ezetimibe, PCSK-9 inhibitors, bempedoic acid and inclisiran.  Discussed mechanisms of action, dosing, side effects and potential decreases in LDL cholesterol.  Also reviewed cost information and potential options for patient assistance.   Current Medications: none, been off meds a couple years  Intolerances: atorvastatin  (fatigue, memory issues), rosuvastatin  (fatigue, memory issues), ezetimibe (can't recall effect) Risk Factors: age, CAD, HTN, D2M, elevated Lpa LDL goal: < 55 Lipid panel (05/2024): TC 173, TG 89, HDL 48, LDL 107 ([facility] in [facility] Every[facility]) Lpa (07/2024): 108.7  Liver enzymes (07/2024): AST 30, ALT 44, Alk phos 62  Diet: The patient has eliminated processed foods and typically eats yogurt with fruit and one slice of whole wheat toast with coffee (no sugar)  for breakfast, or eggs, fruit, or oatmeal. Lunch and dinner usually consist of a salad with lean meat and vegetables, with the larger meal at midday and a lighter meal in the evening. Snacks are minimal, and beverages include mostly water , some tea, and one diet soda per day.  Exercise: He reports going to the gym every other day, alternating between leg workouts and upper-body exercises.  Family History:  Relation Problem Comments  Mother (Alive) Arthritis   Hyperlipidemia   Hypertension   Ovarian cancer   Stroke     Father (Deceased) Heart disease   Hyperlipidemia   Hypertension   Non-Hodgkin's lymphoma     Brother Congestive Heart Failure     Maternal Grandmother (Deceased) Diabetes   Heart disease   Hyperlipidemia   Hypertension   Stroke     Maternal Grandfather (Deceased) Heart disease   Hyperlipidemia   Hypertension     Paternal Grandmother (Deceased) Heart disease     Paternal Grandfather (Other) Heart disease   Hypertension   Stroke     Neg Hx Colon cancer   Colon polyps   Esophageal cancer   Liver disease   Pancreatic cancer   Stomach cancer      Social History:  Alcohol : none Smoking: none, quit smoking 40 years ago  Labs:  Lipid Panel     Component Value Date/Time   CHOL 114 06/11/2015 0247   TRIG 73 06/11/2015 0247   HDL  45 06/11/2015 0247   CHOLHDL 2.5 06/11/2015 0247   VLDL 15 06/11/2015 0247   LDLCALC 54 06/11/2015 0247    Past Medical History:  Diagnosis Date   Anxiety    Barrett's esophagus    CAD (coronary artery disease)    PCI, stent 2011   Cardiac arrhythmia    Chicken pox    Depression    Diabetes mellitus without complication (HCC)    Diverticulitis    Diverticulosis    Gastric ulcer    GERD (gastroesophageal reflux disease)    Glaucoma    Heart attack (HCC) 01/29/2019   History of myocardial infarction    Hyperlipidemia    Hypertension    Kidney stones    Kidney stones    Pneumonia 11/2014   Prostatitis     Reflux    Sleep apnea    UTI (lower urinary tract infection)     Medications Ordered Prior to Encounter[1]  Allergies[2]  Assessment/Plan:  1. Hyperlipidemia -  Problem  Hyperlipidemia   Hyperlipidemia    Hyperlipidemia Assessment:  LDL goal: < 55 mg/dl; last LDLc 892 mg/dl (12/7972) on no lipid lowering therapy Lpa (07/2024): 108.7; discussed significance and the potential variability in Lpa levels after initiating PCSK9 inhibitor therapy Intolerance to atorvastatin  (fatigue, memory issues), rosuvastatin  (fatigue, memory issues), ezetimibe (can't recall effect)   Discussed next potential options (ezetimibe, PCSK-9 inhibitors, bempedoic acid and inclisiran); cost, dosing efficacy, side effects  Patient willing to proceed with PCSK9i therapy Reinforced importance of heart-healthy diet and regular physical activity  Plan: Will apply for PA for PCSK9i; will inform patient upon approval  Lipid lab and Lpa due in 3 months after starting PCSK9i    Thank you,  Zaniya Mcaulay E. Regginald Pask, Pharm.D, CPP [place] [facility]. [facility] [address], [place], [place] [address] Phone: [phone]; Fax: [phone]        [1]  Current Outpatient Medications on File Prior to Visit  Medication Sig Dispense Refill   acetaminophen  (TYLENOL ) 500 MG tablet Take 500-1,000 mg by mouth every 6 (six) hours as needed for moderate pain (pain score 4-6) or headache.     aspirin  EC 81 MG tablet Take 1 tablet (81 mg total) by mouth at bedtime.     Carboxymethylcellul-Glycerin (LUBRICATING EYE DROPS OP) Place 1 drop into both eyes 3 (three) times daily as needed (dry eyes).     clotrimazole-betamethasone (LOTRISONE) cream Apply 1 Application topically 2 (two) times daily.     cycloSPORINE  (RESTASIS ) 0.05 % ophthalmic emulsion Place 1 drop into both eyes 2 (two) times daily.     hydrocortisone  (ANUSOL -HC) 25 MG suppository Place 1 suppository (25 mg total) rectally 2  (two) times daily. 10 suppository 2   hydrocortisone  cream 1 % Apply topically 2 (two) times daily as needed for itching. 118 g 0   hyoscyamine  (LEVSIN  SL) 0.125 MG SL tablet Place 1 tablet (0.125 mg total) under the tongue daily as needed for cramping. 30 tablet 1   JANUVIA 100 MG tablet Take 100 mg by mouth every morning. (Patient not taking: Reported on 11/18/2024)     KLAYESTA powder Apply 1 Application topically daily.     latanoprost  (XALATAN ) 0.005 % ophthalmic solution Place 1 drop into both eyes at bedtime.     losartan  (COZAAR ) 100 MG tablet Take 1 tablet by mouth at bedtime.     Lutein 40 MG CAPS Take 40 mg by mouth every morning.  methylPREDNISolone (MEDROL DOSEPAK) 4 MG TBPK tablet SMARTSIG:- Tablet(s) By Mouth -     metoprolol  succinate (TOPROL -XL) 25 MG 24 hr tablet Take 25 mg by mouth daily. Take one tablet (25mg ) by mouth every morning.     nitroGLYCERIN  (NITROSTAT ) 0.4 MG SL tablet Place 1 tablet (0.4 mg total) under the tongue every 5 (five) minutes x 3 doses as needed for chest pain. 30 tablet 1   Pediatric Multivit-Minerals-C (CHEWABLES MULTIVITAMIN PO) Take 1 tablet by mouth in the morning. Chew one tablet by mouth daily.  Adult multivitamin     prasugrel  (EFFIENT ) 10 MG TABS tablet Take 1 tablet (10 mg total) by mouth at bedtime. Resume 48 hours after surgery     torsemide (DEMADEX) 20 MG tablet Take 20 mg by mouth daily as needed (leg swelling/edema). Take one tablet (20mg ) by mouth daily as needed for leg swelling/fluid build up.  Take an additional tablet the next day if swelling does not decrease.     Vitamin A 3 MG CAPS Take 10,000 Units by mouth every morning.     Vitamin D, Ergocalciferol, (DRISDOL) 1.25 MG (50000 UNIT) CAPS capsule Take 50,000 Units by mouth 2 (two) times a week. Take one capsule by mouth  on Monday and Thursday.     No current facility-administered medications on file prior to visit.  [2]  Allergies Allergen Reactions   Nsaids Other (See  Comments)    Chest pain, headache, nausea, and diarrhea.   Tamsulosin  Other (See Comments)    Light headed and pass out   Fluconazole Other (See Comments)    Severe case of diarrhea, headache, gastric distress   Amlodipine      Causes low heart rate   Ciprofloxacin  Itching   Propoxyphene Itching    Darvocet   Sulfa Antibiotics Itching

## 2024-11-18 NOTE — Telephone Encounter (Signed)
 Pharmacy Patient Advocate Encounter   Received notification from Physician's Office that prior authorization for Repatha is required/requested.   Insurance verification completed.   The patient is insured through [employer].   Per test claim: PA required; PA submitted to above mentioned insurance via Latent Key/confirmation #/EOC [id number] Status is pending

## 2024-11-18 NOTE — Assessment & Plan Note (Signed)
 Assessment:  LDL goal: < 55 mg/dl; last LDLc 892 mg/dl (12/7972) on no lipid lowering therapy Lpa (07/2024): 108.7; discussed significance and the potential variability in Lpa levels after initiating PCSK9 inhibitor therapy Intolerance to atorvastatin  (fatigue, memory issues), rosuvastatin  (fatigue, memory issues), ezetimibe (can't recall effect)   Discussed next potential options (ezetimibe, PCSK-9 inhibitors, bempedoic acid and inclisiran); cost, dosing efficacy, side effects  Patient willing to proceed with PCSK9i therapy Reinforced importance of heart-healthy diet and regular physical activity  Plan: Will apply for PA for PCSK9i; will inform patient upon approval  Lipid lab and Lpa due in 3 months after starting PCSK9i

## 2024-11-18 NOTE — Addendum Note (Signed)
 Addended by: Grayson Pfefferle E on: 11/18/2024 04:35 PM   Modules accepted: Orders

## 2024-11-18 NOTE — Telephone Encounter (Signed)
 Pharmacy Patient Advocate Encounter  Received notification from [employer] that Prior Authorization for Repatha has been APPROVED from 11/18/24 to 11/18/25. Ran test claim, Copay is $12.15- one month. This test claim was processed through [facility]- copay amounts may vary at other pharmacies due to pharmacy/plan contracts, or as the patient moves through the different stages of their insurance plan.   PA #/Case ID/Reference #: [id number]

## 2024-11-18 NOTE — Telephone Encounter (Signed)
 Called patient to discuss copay. NR- LVM. Repatha rx sent to [facility] as requested during visit.

## 2024-11-18 NOTE — Telephone Encounter (Signed)
 Please see other encounter.

## 2024-11-18 NOTE — Telephone Encounter (Signed)
 Called patient to schedule a PharmD hypertension visit with me as soon as possible. NR- LVM. Will continue follow-up to get him scheduled.

## 2024-11-18 NOTE — Patient Instructions (Signed)
 Your Results:             Your most recent labs Goal  Total Cholesterol 173 < 200  Triglycerides 173 < 150  HDL (happy/good cholesterol) 48 > 40  LDL (lousy/bad cholesterol 107 < 55  Lipoprotein a (Lpa) 108.7 < 75   Medication changes: We will start the process to get Repatha/Praluent covered by your insurance.  Once the prior authorization is complete, we will call you to let you know and confirm pharmacy information.    Lab orders:We want to repeat labs 3 months after starting PCSK9i.  We will send you a lab order to remind you once we get        closer to that time.    Izabella Marcantel E. Niobe Dick, Pharm.D, CPP  [facility]. [facility] [address], [place], [place] [address] Phone: [phone]; Fax: [phone]     Praluent is a cholesterol medication that improved your body's ability to get rid of bad cholesterol known as LDL. It can lower your LDL up to 60%. It is an injection that is given under the skin every 2 weeks. The most common side effects of Praluent include runny nose, symptoms of the common cold, rarely flu or flu-like symptoms, back/muscle pain in about 3-4% of the patients, and redness, pain, or bruising at the injection site.    Repatha is a cholesterol medication that improved your body's ability to get rid of bad cholesterol known as LDL. It can lower your LDL up to 60%! It is an injection that is given under the skin every 2 weeks. The medication often requires a prior authorization from your insurance company. We will take care of submitting all the necessary information to your insurance company to get it approved. The most common side effects of Repatha include runny nose, symptoms of the common cold, rarely flu or flu-like symptoms, back/muscle pain in about 3-4% of the patients, and redness, pain, or bruising at the injection site.    It is also recommended that patients with high cholesterol adhere to a heart healthy diet,  get regular exercise, avoid use of tobacco products, and maintain a healthy weight. Steps that you can take to help in these areas:  Limit consumption of trans fats, saturated fats, and cholesterol in your diet  Increase intake of lean meats such as chicken, turkey, and fish  Increase intake of foods rich in fiber such as fresh fruits, vegetables, beans and oatmeal Exercise as you are able; even 30 minutes of walking daily can aid in increasing heart health      Copay Assistance:  The [employer] foundation offers assistance to help pay for medication copays.  They will cover copays for all cholesterol lowering meds, including statins, fibrates, omega-3 oils, ezetimibe, Repatha, Praluent, Nexletol, Nexlizet.  The cards are usually good for $2,500 or 12 months, whichever comes first. Go to healthwellfoundation.org Click on Apply Now Answer questions as to whom is applying (patient or representative) Your disease fund will be hypercholesterolemia - Medicare access Select the cholesterol medication you need assistance with (Repatha, Praluent, Nexlizet...) They will ask question about qualifying diagnosis - you can mark yes; and do you have insurance coverage.   When they ask what type of assistance you are interested in - copay assistance When you submit, the approval is usually within minutes.  You will need to print the card information from the site You will need to show this information  to your pharmacy, they will bill your Medicare Part D plan first -then bill [employer] --for the copay.   You can also call them at [phone], although the hold times can be quite long.

## 2024-11-20 NOTE — Telephone Encounter (Signed)
 Reached patient - He has p/u Repatha.   He has not been checking blood pressure because he just found his BP monitor at home. Instructed patient to check twice daily and bring log to PharmD appointment. Patient to call PharmD if questions/concerns arise before then. Discussed BP goals. PharmD appointment scheduled 12/09/24 in [place]

## 2024-12-09 ENCOUNTER — Ambulatory Visit: Attending: Cardiology

## 2024-12-09 VITALS — BP 159/84 | HR 65

## 2024-12-09 DIAGNOSIS — I10 Essential (primary) hypertension: Secondary | ICD-10-CM | POA: Insufficient documentation

## 2024-12-09 MED ORDER — AMLODIPINE BESYLATE 5 MG PO TABS: 5.0000 mg | ORAL_TABLET | Freq: Every day | ORAL | 2 refills | Status: AC

## 2024-12-09 NOTE — Patient Instructions (Addendum)
 Changes made by your pharmacist Laneshia Pina E. Caliya Narine, PharmD, CPP at today's visit:    Instructions/Changes  (what do you need to do) Your Notes  (what you did and when you did it)  Begin amlodipine  5 mg daily in the morning   2. Continue losartan  100 mg daily, metoprolol  succinate 25 mg daily   3. PharmD appointment    4. Check your blood pressure twice daily, in the morning 2 hours after med and then again in the afternoon     Bring all of your meds, your BP cuff and your record of home blood pressures to your next appointment.   Jsoeph Podesta E. Breanah Faddis, Pharm.D, CPP [place] [facility]. [facility] [address], [place], [place] [address] Phone: [phone]; Fax: [phone]     HOW TO TAKE YOUR BLOOD PRESSURE AT HOME  Rest 5 minutes before taking your blood pressure.  Dont smoke or drink caffeinated beverages for at least 30 minutes before. Take your blood pressure before (not after) you eat. Sit comfortably with your back supported and both feet on the floor (dont cross your legs). Elevate your arm to heart level on a table or a desk. Use the proper sized cuff. It should fit smoothly and snugly around your bare upper arm. There should be enough room to slip a fingertip under the cuff. The bottom edge of the cuff should be 1 inch above the crease of the elbow. Ideally, take 3 measurements at one sitting and record the average.  Important lifestyle changes to control high blood pressure  Intervention  Effect on the BP  Lose extra pounds and watch your waistline Weight loss is one of the most effective lifestyle changes for controlling blood pressure. If you're overweight or obese, losing even a small amount of weight can help reduce blood pressure. Blood pressure might go down by about 1 millimeter of mercury (mm Hg) with each kilogram (about 2.2 pounds) of weight lost.  Exercise regularly As a general goal, aim for at least 30 minutes of moderate  physical activity every day. Regular physical activity can lower high blood pressure by about 5 to 8 mm Hg.  Eat a healthy diet Eating a diet rich in whole grains, fruits, vegetables, and low-fat dairy products and low in saturated fat and cholesterol. A healthy diet can lower high blood pressure by up to 11 mm Hg.  Reduce salt (sodium) in your diet Even a small reduction of sodium in the diet can improve heart health and reduce high blood pressure by about 5 to 6 mm Hg.  Limit alcohol  One drink equals 12 ounces of beer, 5 ounces of wine, or 1.5 ounces of 80-proof liquor.  Limiting alcohol  to less than one drink a day for women or two drinks a day for men can help lower blood pressure by about 4 mm Hg.   If you have any questions or concerns please use My Chart to send questions or call the office at [phone]

## 2024-12-09 NOTE — Progress Notes (Signed)
 Patient ID: PAU BANH                 DOB: 09-Nov-1951                      MRN: [id number]      HPI: Sergio Griffin is a 74 y.o. male referred by Dr. Court to HTN clinic. PMH is significant for  CAD multiple interventions beginning in 2011 with subsequent stenting in 2019 and 2020, most recently DEs to RPDA, HTN, OSA, and D2M.   Patient presents today for PharmD HTN clinic. He is currently taking losartan  100 mg daily and metoprolol  succinate 25 mg daily for BP management. He denies any SOB, chest pain, palpitation, headaches or swelling. He has been checking his blood pressure twice daily. Patient reports BP monitor previously validated. He forgot to bring his blood pressure log today but has a book with BP averages. He notes that his BP is much higher in the mornings (150-160s)/ 70-80s and lower in the afternoons (120s-130s/70-80s). He is unsure why that is. He takes his losartan  at night and metoprolol  in the morning. Discussed metoprolol  has more effect on heart rate than blood pressure. His heart rate at home averages in the 60s. Upon chart review, patient was seen by cardiology provider in December and started on amlodipine  5 mg. He called office and reported he had a ED visit due to GI bleed and reported he was not comfortable continuing amlodipine  because it was stopped in the past due to low heart rate. He was then told this isn't common of amlodipine  and med was stopped.   Today, I discussed the mechanism of action of amlodipine  and explained that amlodipine  does not usually cause this effect vs. other CCBs (non-DHP). He admits that he isn't sure that the low heart rate at the time was attributable to amlodipine  and he may be confusing with another med.   We discussed other alternatives including switching losartan  to olmesartan or adding on thiazide diuretic. Patient agreeable to retrying amlodipine  and monitoring HR very closely.   Regarding lifestyle changes, he shared that he is  getting married soon, and his partner will be moving in and helping him get in better shape and adopt healthier eating habits  Current HTN meds: losartan  100 mg daily, metoprolol  succinate 25 mg daily Previously tried: amlodipine  (low heart rate?) BP goal: < 130/80 Labs (07/2024): Scr 1.2, Crcl 75 ml/min, Na 137, Cl 99  Family History:  Relation Problem Comments  Mother (Alive) Arthritis   Hyperlipidemia   Hypertension   Ovarian cancer   Stroke     Father (Deceased) Heart disease   Hyperlipidemia   Hypertension   Non-Hodgkin's lymphoma     Brother Congestive Heart Failure     Maternal Grandmother (Deceased) Diabetes   Heart disease   Hyperlipidemia   Hypertension   Stroke     Maternal Grandfather (Deceased) Heart disease   Hyperlipidemia   Hypertension     Paternal Grandmother (Deceased) Heart disease     Paternal Grandfather (Other) Heart disease   Hypertension   Stroke     Neg Hx Colon cancer   Colon polyps   Esophageal cancer   Liver disease   Pancreatic cancer   Stomach cancer      Social History:  Alcohol : none Smoking: quit smoking 40 years ago  Exercise:  Patient states that he went to the gym yesterday, and he would like to be more active but  wants to be more stable first to avoid falls  Home BP readings:  No blood pressure log provided but patient reports higher AM BP (150-160s)/ 70-80s and lower in the afternoons (120s-130s/70-80s)   Wt Readings from Last 3 Encounters:  11/04/24 289 lb 3.2 oz (131.2 kg)  08/18/24 298 lb 6 oz (135.3 kg)  08/05/24 295 lb (133.8 kg)   BP Readings from Last 3 Encounters:  12/09/24 (!) 159/84  11/04/24 (!) 146/78  08/18/24 110/60   Pulse Readings from Last 3 Encounters:  12/09/24 65  11/04/24 77  08/18/24 75    Renal function: CrCl cannot be calculated (Patient's most recent lab result is older than the maximum 21 days allowed.).  Past Medical History:  Diagnosis Date   Anxiety    Barrett's esophagus     CAD (coronary artery disease)    PCI, stent 2011   Cardiac arrhythmia    Chicken pox    Depression    Diabetes mellitus without complication (HCC)    Diverticulitis    Diverticulosis    Gastric ulcer    GERD (gastroesophageal reflux disease)    Glaucoma    Heart attack (HCC) 01/29/2019   History of myocardial infarction    Hyperlipidemia    Hypertension    Kidney stones    Kidney stones    Pneumonia 11/2014   Prostatitis    Reflux    Sleep apnea    UTI (lower urinary tract infection)     Medications Ordered Prior to Encounter[1]  Allergies[2]  Blood pressure (!) 159/84, pulse 65.   Assessment/Plan:  1. Hypertension -  Essential hypertension Assessment: Blood pressure remains uncontrolled in clinic at 159/84 mmHg, above the target of <130/80 mmHg. Home readings are elevated in the mornings (150-160s/70-80s) and lower in the afternoons (120s-130s/70-80s). He tolerates his current regimen without adverse effects. He denies shortness of breath, palpitations, chest pain, headaches, or edema. He previously used amlodipine  dating back to 2019 and was restarted on it by cardiology in December 2025. He contacted the office stating he was uncomfortable continuing it because he believed it was discontinued in the past due to low heart rate. Upon further discussion, he is uncertain whether the low heart rate was actually related to amlodipine  and thinks he may have been confusing it with another medication, likely metoprolol , which is more commonly associated with bradycardia. Explained that DHP calcium  channel blockers rarely cause reductions in heart rate, unlike non-DHP agents. Reviewed alternative options including re-trialing amlodipine , switching to olmesartan for better BP control, or adding a thiazide diuretic. The patient is agreeable to retrying amlodipine  with close monitoring of his heart rate. He understands emergency precautions and will notify PharmD if any side  effects occur. Discussed HR and BP goals Reinforced the importance of regular physical activity and adherence to a low-sodium diet.  Plan:  Start taking amlodipine  5 mg daily in the morning; Patient to call PharmD if any concerns arise Continue taking losartan  100 mg daily, metoprolol  succinate 25 mg daily Patient to keep record of BP readings with heart rate and report to us  at the next visit Patient to see Dr. Court 12/2024 for follow up   Thank you  Jared Cahn E. Polette Nofsinger, Pharm.D [place] [facility]. [facility] [address], [place], [place] [address] Phone: [phone]; Fax: [phone]      [1]  Current Outpatient Medications on File Prior to Visit  Medication Sig Dispense Refill   acetaminophen  (TYLENOL )  500 MG tablet Take 500-1,000 mg by mouth every 6 (six) hours as needed for moderate pain (pain score 4-6) or headache.     aspirin  EC 81 MG tablet Take 1 tablet (81 mg total) by mouth at bedtime.     Carboxymethylcellul-Glycerin (LUBRICATING EYE DROPS OP) Place 1 drop into both eyes 3 (three) times daily as needed (dry eyes).     clotrimazole-betamethasone (LOTRISONE) cream Apply 1 Application topically 2 (two) times daily.     cycloSPORINE  (RESTASIS ) 0.05 % ophthalmic emulsion Place 1 drop into both eyes 2 (two) times daily.     Evolocumab  (REPATHA  SURECLICK) 140 MG/ML SOAJ Inject 140 mg into the skin every 14 (fourteen) days. 2 mL 2   hydrocortisone  (ANUSOL -HC) 25 MG suppository Place 1 suppository (25 mg total) rectally 2 (two) times daily. 10 suppository 2   hydrocortisone  cream 1 % Apply topically 2 (two) times daily as needed for itching. 118 g 0   hyoscyamine  (LEVSIN  SL) 0.125 MG SL tablet Place 1 tablet (0.125 mg total) under the tongue daily as needed for cramping. 30 tablet 1   JANUVIA 100 MG tablet Take 100 mg by mouth every morning. (Patient not taking: Reported on 11/18/2024)     KLAYESTA powder Apply 1 Application topically daily.      latanoprost  (XALATAN ) 0.005 % ophthalmic solution Place 1 drop into both eyes at bedtime.     losartan  (COZAAR ) 100 MG tablet Take 1 tablet by mouth at bedtime.     Lutein 40 MG CAPS Take 40 mg by mouth every morning.     methylPREDNISolone (MEDROL DOSEPAK) 4 MG TBPK tablet SMARTSIG:- Tablet(s) By Mouth -     metoprolol  succinate (TOPROL -XL) 25 MG 24 hr tablet Take 25 mg by mouth daily. Take one tablet (25mg ) by mouth every morning.     nitroGLYCERIN  (NITROSTAT ) 0.4 MG SL tablet Place 1 tablet (0.4 mg total) under the tongue every 5 (five) minutes x 3 doses as needed for chest pain. 30 tablet 1   Pediatric Multivit-Minerals-C (CHEWABLES MULTIVITAMIN PO) Take 1 tablet by mouth in the morning. Chew one tablet by mouth daily.  Adult multivitamin     prasugrel  (EFFIENT ) 10 MG TABS tablet Take 1 tablet (10 mg total) by mouth at bedtime. Resume 48 hours after surgery     torsemide (DEMADEX) 20 MG tablet Take 20 mg by mouth daily as needed (leg swelling/edema). Take one tablet (20mg ) by mouth daily as needed for leg swelling/fluid build up.  Take an additional tablet the next day if swelling does not decrease.     Vitamin A 3 MG CAPS Take 10,000 Units by mouth every morning.     Vitamin D, Ergocalciferol, (DRISDOL) 1.25 MG (50000 UNIT) CAPS capsule Take 50,000 Units by mouth 2 (two) times a week. Take one capsule by mouth  on Monday and Thursday.     No current facility-administered medications on file prior to visit.  [2]  Allergies Allergen Reactions   Nsaids Other (See Comments)    Chest pain, headache, nausea, and diarrhea.   Tamsulosin  Other (See Comments)    Light headed and pass out   Fluconazole Other (See Comments)    Severe case of diarrhea, headache, gastric distress   Amlodipine      Causes low heart rate   Ciprofloxacin  Itching   Propoxyphene Itching    Darvocet   Sulfa Antibiotics Itching

## 2024-12-09 NOTE — Assessment & Plan Note (Addendum)
 Assessment: Blood pressure remains uncontrolled in clinic at 159/84 mmHg, above the target of <130/80 mmHg. Home readings are elevated in the mornings (150-160s/70-80s) and lower in the afternoons (120s-130s/70-80s). He tolerates his current regimen without adverse effects. He denies shortness of breath, palpitations, chest pain, headaches, or edema. He previously used amlodipine  dating back to 2019 and was restarted on it by cardiology in December 2025. He contacted the office stating he was uncomfortable continuing it because he believed it was discontinued in the past due to low heart rate. Upon further discussion, he is uncertain whether the low heart rate was actually related to amlodipine  and thinks he may have been confusing it with another medication, likely metoprolol , which is more commonly associated with bradycardia. Explained that DHP calcium  channel blockers rarely cause reductions in heart rate, unlike non-DHP agents. Reviewed alternative options including re-trialing amlodipine , switching to olmesartan for better BP control, or adding a thiazide diuretic. The patient is agreeable to retrying amlodipine  with close monitoring of his heart rate. He understands emergency precautions and will notify PharmD if any side effects occur. Discussed HR and BP goals Reinforced the importance of regular physical activity and adherence to a low-sodium diet.  Plan:  Start taking amlodipine  5 mg daily in the morning; Patient to call PharmD if any concerns arise Continue taking losartan  100 mg daily, metoprolol  succinate 25 mg daily Patient to keep record of BP readings with heart rate and report to us  at the next visit Patient to see Dr. Court 12/2024 for follow up

## 2024-12-10 ENCOUNTER — Telehealth: Payer: Self-pay

## 2024-12-10 NOTE — Telephone Encounter (Signed)
 Called patient to assess blood pressure and heart rate after starting amlodipine  5 mg yesterday. He denies any episodes of bradycardia since beginning the medication. Reports a BP of 143/69 mmHg and HR of 62 today. He denies shortness of breath, chest pain, or dizziness. He feels optimistic that this regimen will help improve his blood pressure.  He noted increased nighttime urination yesterday despite not using his PRN torsemide. He asked whether this could be related to taking losartan  at night. I explained this is less likely and asked about fluid intake the day prior; he denied excess intake. He stated he may try taking the medication in the morning instead, which I told him is fine.  He was advised to call PharmD if his blood pressure worsens or symptoms develop. ER precautions discussed. Patient verbalized understanding.

## 2024-12-23 NOTE — Progress Notes (Unsigned)
 "  Initial neurology clinic note  Sergio Griffin MRN: [id number] DOB: [birth year]  Referring provider: Kristine Corean Deed, NP  Primary care provider: Kristine Corean Deed, NP  Reason for consult:  headaches  Subjective:  This is [name], a 74 y.o. ***-handed male with a medical history of open angle glaucoma***, HTN, CKD, HLD, DM2, OSA, CAD s/p CABG, vit D deficiency, B12 deficiency, diverticulosis, chronic low back pain, former smoker*** who presents to neurology clinic with ***. The patient is accompanied by ***.  *** Had a fall and went to ED CTH negative Continues to have frequent headaches and blurriness in vision  Supplements?  Has sulfa allergy Has open angle glaucoma, severe? Sees [facility]***  Smoker: OCP use: Caffiene use: EtOH use: Restrictive diet: Family history of neurologic disease including headaches:   MEDICATIONS:  Outpatient Encounter Medications as of 01/01/2025  Medication Sig   acetaminophen  (TYLENOL ) 500 MG tablet Take 500-1,000 mg by mouth every 6 (six) hours as needed for moderate pain (pain score 4-6) or headache.   amLODipine  (NORVASC ) 5 MG tablet Take 1 tablet (5 mg total) by mouth daily.   aspirin  EC 81 MG tablet Take 1 tablet (81 mg total) by mouth at bedtime.   Carboxymethylcellul-Glycerin (LUBRICATING EYE DROPS OP) Place 1 drop into both eyes 3 (three) times daily as needed (dry eyes).   clotrimazole-betamethasone (LOTRISONE) cream Apply 1 Application topically 2 (two) times daily.   cycloSPORINE  (RESTASIS ) 0.05 % ophthalmic emulsion Place 1 drop into both eyes 2 (two) times daily.   Evolocumab  (REPATHA  SURECLICK) 140 MG/ML SOAJ Inject 140 mg into the skin every 14 (fourteen) days.   hydrocortisone  (ANUSOL -HC) 25 MG suppository Place 1 suppository (25 mg total) rectally 2 (two) times daily.   hydrocortisone  cream 1 % Apply topically 2 (two) times daily as needed for itching.   hyoscyamine  (LEVSIN  SL) 0.125 MG SL  tablet Place 1 tablet (0.125 mg total) under the tongue daily as needed for cramping.   JANUVIA 100 MG tablet Take 100 mg by mouth every morning. (Patient not taking: Reported on 11/18/2024)   KLAYESTA powder Apply 1 Application topically daily.   latanoprost  (XALATAN ) 0.005 % ophthalmic solution Place 1 drop into both eyes at bedtime.   losartan  (COZAAR ) 100 MG tablet Take 1 tablet by mouth at bedtime.   Lutein 40 MG CAPS Take 40 mg by mouth every morning.   methylPREDNISolone (MEDROL DOSEPAK) 4 MG TBPK tablet SMARTSIG:- Tablet(s) By Mouth -   metoprolol  succinate (TOPROL -XL) 25 MG 24 hr tablet Take 25 mg by mouth daily. Take one tablet (25mg ) by mouth every morning.   nitroGLYCERIN  (NITROSTAT ) 0.4 MG SL tablet Place 1 tablet (0.4 mg total) under the tongue every 5 (five) minutes x 3 doses as needed for chest pain.   Pediatric Multivit-Minerals-C (CHEWABLES MULTIVITAMIN PO) Take 1 tablet by mouth in the morning. Chew one tablet by mouth daily.  Adult multivitamin   prasugrel  (EFFIENT ) 10 MG TABS tablet Take 1 tablet (10 mg total) by mouth at bedtime. Resume 48 hours after surgery   torsemide (DEMADEX) 20 MG tablet Take 20 mg by mouth daily as needed (leg swelling/edema). Take one tablet (20mg ) by mouth daily as needed for leg swelling/fluid build up.  Take an additional tablet the next day if swelling does not decrease.   Vitamin A 3 MG CAPS Take 10,000 Units by mouth every morning.   Vitamin D, Ergocalciferol, (DRISDOL) 1.25 MG (50000 UNIT) CAPS capsule Take 50,000 Units by  mouth 2 (two) times a week. Take one capsule by mouth  on Monday and Thursday.   No facility-administered encounter medications on file as of 01/01/2025.    PAST MEDICAL HISTORY: Past Medical History:  Diagnosis Date   Anxiety    Barrett's esophagus    CAD (coronary artery disease)    PCI, stent 2011   Cardiac arrhythmia    Chicken pox    Depression    Diabetes mellitus without complication (HCC)    Diverticulitis     Diverticulosis    Gastric ulcer    GERD (gastroesophageal reflux disease)    Glaucoma    Heart attack (HCC) 01/29/2019   History of myocardial infarction    Hyperlipidemia    Hypertension    Kidney stones    Kidney stones    Pneumonia 11/2014   Prostatitis    Reflux    Sleep apnea    UTI (lower urinary tract infection)     PAST SURGICAL HISTORY: Past Surgical History:  Procedure Laterality Date   cardiac stents     last one 01/29/2019   CORONARY ANGIOGRAPHY N/A 07/24/2024   Procedure: CORONARY ANGIOGRAPHY;  Surgeon: Wendel Lurena POUR, MD;  Location: [facility];  Service: Cardiovascular;  Laterality: N/A;   CORONARY STENT INTERVENTION N/A 07/24/2024   Procedure: CORONARY STENT INTERVENTION;  Surgeon: Wendel Lurena POUR, MD;  Location: [facility];  Service: Cardiovascular;  Laterality: N/A;   CYSTOSCOPY/URETEROSCOPY/HOLMIUM LASER/STENT PLACEMENT Left 10/22/2019   Procedure: CYSTOSCOPY/URETEROSCOPY/HOLMIUM LASER/STENT PLACEMENT;  Surgeon: Cam Morene ORN, MD;  Location: [facility];  Service: Urology;  Laterality: Left;   ESOPHAGEAL MANOMETRY N/A 09/11/2017   Procedure: ESOPHAGEAL MANOMETRY (EM);  Surgeon: Shila Gustav GAILS, MD;  Location: [facility];  Service: Endoscopy;  Laterality: N/A;   EYE SURGERY Left    x 4   EYE SURGERY Right    x 3    ALLERGIES: Allergies[1]  FAMILY HISTORY: Family History  Problem Relation Age of Onset   Arthritis Mother    Hyperlipidemia Mother    Stroke Mother    Hypertension Mother    Ovarian cancer Mother    Hyperlipidemia Father    Heart disease Father    Hypertension Father    Non-Hodgkin's lymphoma Father    Congestive Heart Failure Brother    Hyperlipidemia Maternal Grandmother    Heart disease Maternal Grandmother    Stroke Maternal Grandmother    Hypertension Maternal Grandmother    Diabetes Maternal Grandmother    Hyperlipidemia Maternal Grandfather    Heart disease Maternal Grandfather    Hypertension  Maternal Grandfather    Heart disease Paternal Grandmother    Heart disease Paternal Grandfather    Stroke Paternal Grandfather    Hypertension Paternal Grandfather    Colon cancer Neg Hx    Esophageal cancer Neg Hx    Pancreatic cancer Neg Hx    Stomach cancer Neg Hx    Liver disease Neg Hx    Colon polyps Neg Hx     SOCIAL HISTORY: Social History[2] Social History   Social History Narrative   Fun: Curator, financial risk analyst, travel   Denies religious beliefs effecting health care.     Objective:  Vital Signs:  There were no vitals taken for this visit.  ***  Labs and Imaging review: Internal labs: TSH (07/24/24) wnl HbA1c (03/30/24): 6.9  External labs: 10/07/24: CBC unremarkable Iron low at 27, ferritin 32  Imaging/Procedures: EKG (07/25/24): Sinus brady; premature atrial complexes; normal QT  Assessment/Plan:  Sergio Griffin is a 74 y.o. male who presents for evaluation of ***. *** has a relevant medical history of ***. *** neurological examination is pertinent for ***. Available diagnostic data is significant for ***. This constellation of symptoms and objective data would most likely localize to ***. ***  PLAN: -Blood work: *** ***  -Return to clinic ***  The impression above as well as the plan as outlined below were extensively discussed with the patient (in the company of ***) who voiced understanding. All questions were answered to their satisfaction.  The patient was counseled on pertinent fall precautions per the printed material provided today, and as noted under the Patient Instructions section below.***  When available, results of the above investigations and possible further recommendations will be communicated to the patient via telephone/MyChart. Patient to call office if not contacted after expected testing turnaround time.   Total time spent reviewing records, interview, history/exam, documentation, and coordination of care on day of encounter:  *** min    Thank you for allowing me to participate in patient's care.  If I can answer any additional questions, I would be pleased to do so.  Venetia Potters, MD   CC: Sergio, Corean Deed, NP [address] [place] [address]  CC: Referring provider: Kristine Corean Deed, NP [address],  [place] [address]    [1]  Allergies Allergen Reactions   Nsaids Other (See Comments)    Chest pain, headache, nausea, and diarrhea.   Tamsulosin  Other (See Comments)    Light headed and pass out   Fluconazole Other (See Comments)    Severe case of diarrhea, headache, gastric distress   Amlodipine      Causes low heart rate   Ciprofloxacin  Itching   Propoxyphene Itching    Darvocet   Sulfa Antibiotics Itching  [2]  Social History Tobacco Use   Smoking status: Former    Types: Cigarettes   Smokeless tobacco: Never   Tobacco comments:    quit 1987  Vaping Use   Vaping status: Never Used  Substance Use Topics   Alcohol  use: No   Drug use: No   "

## 2025-01-01 ENCOUNTER — Ambulatory Visit: Payer: Self-pay | Admitting: Neurology

## 2025-01-12 ENCOUNTER — Ambulatory Visit: Admitting: Cardiovascular Disease

## 2025-01-14 ENCOUNTER — Ambulatory Visit
